# Patient Record
Sex: Male | Born: 1970 | Race: White | Hispanic: No | Marital: Married | Smoking: Former smoker
Health system: Southern US, Community
[De-identification: ages and names within clinical notes are randomized; demographics above are authoritative.]

## PROBLEM LIST (undated history)

## (undated) DIAGNOSIS — F329 Major depressive disorder, single episode, unspecified: Secondary | ICD-10-CM

## (undated) DIAGNOSIS — E872 Acidosis, unspecified: Secondary | ICD-10-CM

## (undated) DIAGNOSIS — A0472 Enterocolitis due to Clostridium difficile, not specified as recurrent: Secondary | ICD-10-CM

## (undated) DIAGNOSIS — K631 Perforation of intestine (nontraumatic): Secondary | ICD-10-CM

## (undated) DIAGNOSIS — K5931 Toxic megacolon: Secondary | ICD-10-CM

## (undated) DIAGNOSIS — I10 Essential (primary) hypertension: Secondary | ICD-10-CM

## (undated) DIAGNOSIS — F419 Anxiety disorder, unspecified: Secondary | ICD-10-CM

## (undated) DIAGNOSIS — K5090 Crohn's disease, unspecified, without complications: Secondary | ICD-10-CM

## (undated) DIAGNOSIS — K630 Abscess of intestine: Secondary | ICD-10-CM

## (undated) DIAGNOSIS — R6521 Severe sepsis with septic shock: Secondary | ICD-10-CM

## (undated) DIAGNOSIS — K632 Fistula of intestine: Secondary | ICD-10-CM

## (undated) DIAGNOSIS — A419 Sepsis, unspecified organism: Secondary | ICD-10-CM

## (undated) DIAGNOSIS — N179 Acute kidney failure, unspecified: Secondary | ICD-10-CM

## (undated) DIAGNOSIS — F32A Depression, unspecified: Secondary | ICD-10-CM

## (undated) DIAGNOSIS — M459 Ankylosing spondylitis of unspecified sites in spine: Secondary | ICD-10-CM

## (undated) HISTORY — DX: Acute kidney failure, unspecified: N17.9

## (undated) HISTORY — DX: Acidosis: E87.2

## (undated) HISTORY — DX: Perforation of intestine (nontraumatic): K63.1

## (undated) HISTORY — DX: Sepsis, unspecified organism: A41.9

## (undated) HISTORY — DX: Abscess of intestine: K63.0

## (undated) HISTORY — DX: Toxic megacolon: K59.31

## (undated) HISTORY — DX: Major depressive disorder, single episode, unspecified: F32.9

## (undated) HISTORY — DX: Depression, unspecified: F32.A

## (undated) HISTORY — DX: Fistula of intestine: K63.2

## (undated) HISTORY — DX: Essential (primary) hypertension: I10

## (undated) HISTORY — DX: Severe sepsis with septic shock: R65.21

## (undated) HISTORY — DX: Acidosis, unspecified: E87.20

---

## 2012-02-17 ENCOUNTER — Other Ambulatory Visit: Payer: Self-pay | Admitting: Rheumatology

## 2012-02-17 ENCOUNTER — Ambulatory Visit
Admission: RE | Admit: 2012-02-17 | Discharge: 2012-02-17 | Disposition: A | Discharge status: Home / Self Care | Payer: Managed Care, Other (non HMO) | Source: Ambulatory Visit | Attending: Rheumatology | Admitting: Rheumatology

## 2012-02-17 DIAGNOSIS — M459 Ankylosing spondylitis of unspecified sites in spine: Secondary | ICD-10-CM

## 2012-02-17 DIAGNOSIS — M549 Dorsalgia, unspecified: Secondary | ICD-10-CM

## 2012-02-23 ENCOUNTER — Encounter: Payer: Self-pay | Admitting: Family Medicine

## 2012-02-23 ENCOUNTER — Ambulatory Visit (INDEPENDENT_AMBULATORY_CARE_PROVIDER_SITE_OTHER): Payer: Managed Care, Other (non HMO) | Admitting: Family Medicine

## 2012-02-23 VITALS — BP 122/85 | HR 65 | Temp 98.7°F | Resp 18 | Ht 69.0 in | Wt 193.0 lb

## 2012-02-23 DIAGNOSIS — Z111 Encounter for screening for respiratory tuberculosis: Secondary | ICD-10-CM

## 2012-02-23 NOTE — Progress Notes (Signed)
Subjective: Patient is here for a TB skin test. He has no history of TB exposure. He works for a [employer]. He has history of ankylosing spondylitis and Crohn's disease, and has been on Humira in the past. He got off of it in his move here, and is having to jump through the tubes to get back on it. Therefore he needs a skin test. This  Assessment: TB skin testing Crohn's disease Ankylosing spondylitis  Plan: PPD Patient plans to have Dr Kellie Simmering read test at appt. On Thurs AM

## 2012-02-27 LAB — TB SKIN TEST: Induration: 0 mm

## 2014-06-20 ENCOUNTER — Encounter: Payer: Self-pay | Admitting: Gastroenterology

## 2014-06-28 ENCOUNTER — Other Ambulatory Visit: Payer: Self-pay | Admitting: Gastroenterology

## 2014-06-28 DIAGNOSIS — K625 Hemorrhage of anus and rectum: Secondary | ICD-10-CM

## 2014-06-28 DIAGNOSIS — K50911 Crohn's disease, unspecified, with rectal bleeding: Secondary | ICD-10-CM

## 2014-06-28 DIAGNOSIS — R1084 Generalized abdominal pain: Secondary | ICD-10-CM

## 2014-07-11 ENCOUNTER — Ambulatory Visit
Admission: RE | Admit: 2014-07-11 | Discharge: 2014-07-11 | Disposition: A | Discharge status: Home / Self Care | Payer: 59 | Source: Ambulatory Visit | Attending: Gastroenterology | Admitting: Gastroenterology

## 2014-07-11 DIAGNOSIS — K50911 Crohn's disease, unspecified, with rectal bleeding: Secondary | ICD-10-CM

## 2014-07-11 DIAGNOSIS — K625 Hemorrhage of anus and rectum: Secondary | ICD-10-CM

## 2014-07-11 DIAGNOSIS — R1084 Generalized abdominal pain: Secondary | ICD-10-CM

## 2014-07-11 MED ORDER — IOHEXOL 300 MG/ML  SOLN
100.0000 mL | Freq: Once | INTRAMUSCULAR | Status: AC | PRN
  Administered 2014-07-11: 100 mL via INTRAVENOUS

## 2014-07-13 LAB — HM COLONOSCOPY

## 2014-08-18 ENCOUNTER — Ambulatory Visit: Payer: Managed Care, Other (non HMO) | Admitting: Gastroenterology

## 2014-09-28 ENCOUNTER — Inpatient Hospital Stay (HOSPITAL_COMMUNITY)
Admission: AD | Admit: 2014-09-28 | Discharge: 2014-10-10 | DRG: 371 | Disposition: A | Discharge status: Home / Self Care | Payer: 59 | Source: Ambulatory Visit | Attending: Gastroenterology | Admitting: Gastroenterology

## 2014-09-28 ENCOUNTER — Encounter (HOSPITAL_COMMUNITY): Payer: Self-pay | Admitting: General Practice

## 2014-09-28 DIAGNOSIS — Z8619 Personal history of other infectious and parasitic diseases: Secondary | ICD-10-CM | POA: Diagnosis present

## 2014-09-28 DIAGNOSIS — R300 Dysuria: Secondary | ICD-10-CM | POA: Diagnosis present

## 2014-09-28 DIAGNOSIS — K5090 Crohn's disease, unspecified, without complications: Secondary | ICD-10-CM | POA: Diagnosis present

## 2014-09-28 DIAGNOSIS — E43 Unspecified severe protein-calorie malnutrition: Secondary | ICD-10-CM | POA: Diagnosis present

## 2014-09-28 DIAGNOSIS — E876 Hypokalemia: Secondary | ICD-10-CM | POA: Diagnosis not present

## 2014-09-28 DIAGNOSIS — Z87891 Personal history of nicotine dependence: Secondary | ICD-10-CM

## 2014-09-28 DIAGNOSIS — K5190 Ulcerative colitis, unspecified, without complications: Secondary | ICD-10-CM | POA: Diagnosis present

## 2014-09-28 DIAGNOSIS — D473 Essential (hemorrhagic) thrombocythemia: Secondary | ICD-10-CM | POA: Diagnosis present

## 2014-09-28 DIAGNOSIS — R634 Abnormal weight loss: Secondary | ICD-10-CM | POA: Diagnosis present

## 2014-09-28 DIAGNOSIS — D649 Anemia, unspecified: Secondary | ICD-10-CM | POA: Diagnosis present

## 2014-09-28 DIAGNOSIS — D72829 Elevated white blood cell count, unspecified: Secondary | ICD-10-CM | POA: Diagnosis present

## 2014-09-28 DIAGNOSIS — E860 Dehydration: Secondary | ICD-10-CM | POA: Diagnosis present

## 2014-09-28 DIAGNOSIS — A047 Enterocolitis due to Clostridium difficile: Secondary | ICD-10-CM | POA: Diagnosis present

## 2014-09-28 DIAGNOSIS — D75839 Thrombocytosis, unspecified: Secondary | ICD-10-CM | POA: Diagnosis present

## 2014-09-28 DIAGNOSIS — E8809 Other disorders of plasma-protein metabolism, not elsewhere classified: Secondary | ICD-10-CM | POA: Diagnosis present

## 2014-09-28 DIAGNOSIS — M459 Ankylosing spondylitis of unspecified sites in spine: Secondary | ICD-10-CM | POA: Diagnosis present

## 2014-09-28 DIAGNOSIS — Z6824 Body mass index (BMI) 24.0-24.9, adult: Secondary | ICD-10-CM

## 2014-09-28 HISTORY — DX: Enterocolitis due to Clostridium difficile, not specified as recurrent: A04.72

## 2014-09-28 HISTORY — DX: Ankylosing spondylitis of unspecified sites in spine: M45.9

## 2014-09-28 HISTORY — DX: Crohn's disease, unspecified, without complications: K50.90

## 2014-09-28 LAB — BASIC METABOLIC PANEL
Anion gap: 10 (ref 5–15)
BUN: 10 mg/dL (ref 6–23)
CHLORIDE: 94 mmol/L — AB (ref 96–112)
CO2: 24 mmol/L (ref 19–32)
Calcium: 7.7 mg/dL — ABNORMAL LOW (ref 8.4–10.5)
Creatinine, Ser: 0.95 mg/dL (ref 0.50–1.35)
Glucose, Bld: 121 mg/dL — ABNORMAL HIGH (ref 70–99)
POTASSIUM: 3.6 mmol/L (ref 3.5–5.1)
Sodium: 128 mmol/L — ABNORMAL LOW (ref 135–145)

## 2014-09-28 LAB — CBC
HCT: 29.3 % — ABNORMAL LOW (ref 39.0–52.0)
HEMOGLOBIN: 10 g/dL — AB (ref 13.0–17.0)
MCH: 30.8 pg (ref 26.0–34.0)
MCHC: 34.1 g/dL (ref 30.0–36.0)
MCV: 90.2 fL (ref 78.0–100.0)
Platelets: 408 10*3/uL — ABNORMAL HIGH (ref 150–400)
RBC: 3.25 MIL/uL — ABNORMAL LOW (ref 4.22–5.81)
RDW: 15.7 % — AB (ref 11.5–15.5)
WBC: 18.4 10*3/uL — ABNORMAL HIGH (ref 4.0–10.5)

## 2014-09-28 MED ORDER — ONDANSETRON HCL 4 MG/2ML IJ SOLN
4.0000 mg | Freq: Three times a day (TID) | INTRAMUSCULAR | Status: DC | PRN
  Administered 2014-09-28 – 2014-10-07 (×15): 4 mg via INTRAVENOUS
  Filled 2014-09-28 (×16): qty 2

## 2014-09-28 MED ORDER — VANCOMYCIN 50 MG/ML ORAL SOLUTION
125.0000 mg | Freq: Four times a day (QID) | ORAL | Status: DC
  Administered 2014-09-28 – 2014-10-01 (×11): 125 mg via ORAL
  Filled 2014-09-28 (×16): qty 2.5

## 2014-09-28 MED ORDER — BOOST / RESOURCE BREEZE PO LIQD
1.0000 | Freq: Three times a day (TID) | ORAL | Status: DC
  Administered 2014-09-28 – 2014-10-08 (×28): 1 via ORAL

## 2014-09-28 MED ORDER — MORPHINE SULFATE 2 MG/ML IJ SOLN
2.0000 mg | INTRAMUSCULAR | Status: DC | PRN
  Administered 2014-09-28 – 2014-09-30 (×16): 2 mg via INTRAVENOUS
  Filled 2014-09-28 (×16): qty 1

## 2014-09-28 MED ORDER — SODIUM CHLORIDE 0.9 % IV SOLN: INTRAVENOUS | Status: DC

## 2014-09-28 MED ORDER — SODIUM CHLORIDE 0.9 % IV SOLN
INTRAVENOUS | Status: DC
  Administered 2014-09-28 – 2014-09-29 (×6): via INTRAVENOUS

## 2014-09-28 MED ORDER — CETYLPYRIDINIUM CHLORIDE 0.05 % MT LIQD
7.0000 mL | Freq: Two times a day (BID) | OROMUCOSAL | Status: DC
  Administered 2014-09-28 – 2014-10-08 (×17): 7 mL via OROMUCOSAL

## 2014-09-28 MED ORDER — METHYLPREDNISOLONE SODIUM SUCC 40 MG IJ SOLR
40.0000 mg | Freq: Two times a day (BID) | INTRAMUSCULAR | Status: DC
  Administered 2014-09-28 – 2014-10-05 (×14): 40 mg via INTRAVENOUS
  Filled 2014-09-28 (×20): qty 1

## 2014-09-28 NOTE — H&P (Signed)
Vincent Black HPI: This is a 44 year old male with a PMH of Crohn's disease diagnosed in 1998 and ankylosing spondylitis admitted to the hospital for dehydration, weight loss, and a flare of his Crohn's disease.  In 06/2014 he was treated for a flare of his Crohn's.  In the past he was treated with Remicade, but then he lost his insurance.  He was not responsive to a reintroduction of his Remicade and it was around 2008-2010 that he was diagnosed with Ankylosing Spondylitis.  Humira was initiated and then his Crohn's went into remission until recently.  He was evaluated by Dr. Amil Amen, his Rheumatologist, and he was treated with Humira for his Ankylosing Spondylitis.  A Medrol Dose Pack was started by Dr. Amil Amen and he was promptly referred to me for further evaluation and treatment.  A colonoscopy was performed and it did reveal a significant amount of inflammation, but he started to improve as AZA was instituted.  He was well for about a month or so and then he started to have diarrhea again.  Work up in the office revealed that he had C. Diff and despite treatment with metronidazole he continued to worsen.  Follow up blood work in the office demonstrated an increase in his CRP.  I had offered to admit him to the hospital a couple of weeks ago, but he wanted to avoid this option.  He was supposed to undergo a FFS today to see the extent of his inflammation, but he was promptly admitted.  The patient was dehydrated and very weak.  In fact, his wife states that he had three brief syncopal episodes last evening.  Additionally, he has lost weight and he reports fevers and chills.  When he was first started on treatment for his C. Diff his fever resolved.  Past Medical History  Diagnosis Date  . Crohn disease     Past Surgical History  Procedure Laterality Date  . No past surgeries      History reviewed. No pertinent family history.  Social History:  reports that he has quit smoking. He has never  used smokeless tobacco. He reports that he drinks alcohol. He reports that he does not use illicit drugs.  Allergies: No Known Allergies  Medications:  Scheduled: . feeding supplement (RESOURCE BREEZE)  1 Container Oral TID BM  . methylPREDNISolone (SOLU-MEDROL) injection  40 mg Intravenous Q12H  . vancomycin  125 mg Oral 4 times per day   Continuous: . sodium chloride 200 mL/hr at 09/28/14 1404  . sodium chloride      Results for orders placed or performed during the hospital encounter of 09/28/14 (from the past 24 hour(s))  CBC     Status: Abnormal   Collection Time: 09/28/14  2:30 PM  Result Value Ref Range   WBC 18.4 (H) 4.0 - 10.5 K/uL   RBC 3.25 (L) 4.22 - 5.81 MIL/uL   Hemoglobin 10.0 (L) 13.0 - 17.0 g/dL   HCT 29.3 (L) 39.0 - 52.0 %   MCV 90.2 78.0 - 100.0 fL   MCH 30.8 26.0 - 34.0 pg   MCHC 34.1 30.0 - 36.0 g/dL   RDW 15.7 (H) 11.5 - 15.5 %   Platelets 408 (H) 150 - 400 K/uL  Basic metabolic panel     Status: Abnormal   Collection Time: 09/28/14  2:30 PM  Result Value Ref Range   Sodium 128 (L) 135 - 145 mmol/L   Potassium 3.6 3.5 - 5.1 mmol/L  Chloride 94 (L) 96 - 112 mmol/L   CO2 24 19 - 32 mmol/L   Glucose, Bld 121 (H) 70 - 99 mg/dL   BUN 10 6 - 23 mg/dL   Creatinine, Ser 0.95 0.50 - 1.35 mg/dL   Calcium 7.7 (L) 8.4 - 10.5 mg/dL   GFR calc non Af Amer >90 >90 mL/min   GFR calc Af Amer >90 >90 mL/min   Anion gap 10 5 - 15     No results found.  ROS:  As stated above in the HPI otherwise negative.  Blood pressure 125/87, pulse 122, temperature 100.1 F (37.8 C), temperature source Oral, resp. rate 17, height 5\' 9"  (1.753 m), weight 74.4 kg (164 lb 0.4 oz), SpO2 100 %.    PE: Gen: NAD, Alert and Oriented, weak, and fatigued HEENT:  [place]/AT, EOMI Neck: Supple, no LAD Lungs: CTA Bilaterally CV: RRR without M/G/R ABM: Soft, tender in the LLQ, +BS Ext: No C/C/E  Assessment/Plan: 1) Crohn's Flare. 2) ? Persistent C. Diff.   I do not know why he  continues to worsen.  He had two weeks of treatment for C. Diff with metornidazole, but I will change him over to vancomycin while the repeat C. Diff toxin is pending.  Additionally, I will perform the FFS in the AM tomorrow.  Hopefully IV steroids as well as IV hydration will help to improve him clinically.  If he is negative for C. Diff, I will increase his Humira to every week dosing.    Plan: 1) FFS in the AM. 2) Solumedrol 40 mg IV Q12 hours. 3) Vancomycin 125 mg QID. 4) Pain control.  Donya Tomaro D 09/28/2014, 3:33 PM

## 2014-09-28 NOTE — Progress Notes (Signed)
NURSING PROGRESS NOTE  Vincent Black [id number] Admission Data: 09/28/2014 6:17 PM Attending Provider: Beryle Beams, MD PCP:No primary care provider on file. Code Status: Full  Vincent Black is a 44 y.o. male patient admitted from ED:  -No acute distress noted.  -No complaints of shortness of breath.  -No complaints of chest pain.    Blood pressure 125/87, pulse 122, temperature 100.1 F (37.8 C), temperature source Oral, resp. rate 17, height 5\' 9"  (1.753 m), weight 74.4 kg (164 lb 0.4 oz), SpO2 100 %.   IV Fluids:  IV in place, occlusive dsg intact without redness, IV cath wrist left, condition patent and no redness normal saline.   Allergies:  Review of patient's allergies indicates no known allergies.  Past Medical History:   has a past medical history of Crohn disease.  Past Surgical History:   has past surgical history that includes No past surgeries.  Social History:   reports that he has quit smoking. He has never used smokeless tobacco. He reports that he drinks alcohol. He reports that he does not use illicit drugs.  Skin: Intact  Patient/Family orientated to room. Information packet given to patient/family. Admission inpatient armband information verified with patient/family to include name and date of birth and placed on patient arm. Side rails up x 2, fall assessment and education completed with patient/family. Patient/family able to verbalize understanding of risk associated with falls and verbalized understanding to call for assistance before getting out of bed. Call light within reach. Patient/family able to voice and demonstrate understanding of unit orientation instructions.    Will continue to evaluate and treat per MD orders.

## 2014-09-29 ENCOUNTER — Encounter (HOSPITAL_COMMUNITY): Payer: Self-pay

## 2014-09-29 ENCOUNTER — Encounter (HOSPITAL_COMMUNITY)
Admission: AD | Disposition: A | Discharge status: Home / Self Care | Payer: Self-pay | Source: Ambulatory Visit | Attending: Gastroenterology

## 2014-09-29 HISTORY — PX: FLEXIBLE SIGMOIDOSCOPY: SHX5431

## 2014-09-29 LAB — CBC
HEMATOCRIT: 26.8 % — AB (ref 39.0–52.0)
HEMOGLOBIN: 8.9 g/dL — AB (ref 13.0–17.0)
MCH: 30.5 pg (ref 26.0–34.0)
MCHC: 33.2 g/dL (ref 30.0–36.0)
MCV: 91.8 fL (ref 78.0–100.0)
PLATELETS: 390 10*3/uL (ref 150–400)
RBC: 2.92 MIL/uL — AB (ref 4.22–5.81)
RDW: 16.2 % — ABNORMAL HIGH (ref 11.5–15.5)
WBC: 16.4 10*3/uL — ABNORMAL HIGH (ref 4.0–10.5)

## 2014-09-29 LAB — BASIC METABOLIC PANEL
Anion gap: 6 (ref 5–15)
BUN: 10 mg/dL (ref 6–23)
CHLORIDE: 105 mmol/L (ref 96–112)
CO2: 25 mmol/L (ref 19–32)
Calcium: 7.8 mg/dL — ABNORMAL LOW (ref 8.4–10.5)
Creatinine, Ser: 0.84 mg/dL (ref 0.50–1.35)
GFR calc Af Amer: >90 mL/min (ref >90)
GFR calc non Af Amer: >90 mL/min (ref >90)
GLUCOSE: 157 mg/dL — AB (ref 70–99)
Potassium: 3.9 mmol/L (ref 3.5–5.1)
Sodium: 136 mmol/L (ref 135–145)

## 2014-09-29 LAB — C-REACTIVE PROTEIN: CRP: 24.7 mg/dL — ABNORMAL HIGH (ref <0.60)

## 2014-09-29 LAB — CLOSTRIDIUM DIFFICILE BY PCR: CDIFFPCR: POSITIVE — AB

## 2014-09-29 SURGERY — SIGMOIDOSCOPY, FLEXIBLE
Anesthesia: Moderate Sedation

## 2014-09-29 MED ORDER — FENTANYL CITRATE 0.05 MG/ML IJ SOLN
INTRAMUSCULAR | Status: DC | PRN
  Administered 2014-09-29 (×2): 25 ug via INTRAVENOUS

## 2014-09-29 MED ORDER — MIDAZOLAM HCL 10 MG/2ML IJ SOLN
INTRAMUSCULAR | Status: DC | PRN
  Administered 2014-09-29: 1 mg via INTRAVENOUS
  Administered 2014-09-29 (×2): 2 mg via INTRAVENOUS

## 2014-09-29 MED ORDER — FENTANYL CITRATE 0.05 MG/ML IJ SOLN
INTRAMUSCULAR | Status: AC
  Filled 2014-09-29: qty 2

## 2014-09-29 MED ORDER — MIDAZOLAM HCL 5 MG/ML IJ SOLN
INTRAMUSCULAR | Status: AC
  Filled 2014-09-29: qty 2

## 2014-09-29 MED ORDER — DIPHENHYDRAMINE HCL 50 MG/ML IJ SOLN
INTRAMUSCULAR | Status: AC
  Filled 2014-09-29: qty 1

## 2014-09-29 NOTE — Progress Notes (Signed)
INITIAL NUTRITION ASSESSMENT  DOCUMENTATION CODES Per approved criteria  -Not Applicable   INTERVENTION: Resource Breeze po TID, each supplement provides 250 kcal and 9 grams of protein  NUTRITION DIAGNOSIS: Inadequate oral intake related to altered GI function as evidenced by clear liquids, crohn's flare.   Goal: Pt will meet >90% of estimated nutritional needs  Monitor:  Diet advancement, PO/supplement intake, labs, weight changes, I/O's  Reason for Assessment: MST=5  44 y.o. male  Admitting Dx: <principal problem not specified>  This is a 44 year old male with a PMH of Crohn's disease diagnosed in 1998 and ankylosing spondylitis admitted to the hospital for dehydration, weight loss, and a flare of his Crohn's disease. In 06/2014 he was treated for a flare of his Crohn's. In the past he was treated with Remicade, but then he lost his insurance. He was not responsive to a reintroduction of his Remicade and it was around 2008-2010 that he was diagnosed with Ankylosing Spondylitis. Humira was initiated and then his Crohn's went into remission until recently.  ASSESSMENT: Pt admitted with crohn's flare-up.   S/p Procedure(s) on 09/29/14: FLEXIBLE SIGMOIDOSCOPY (N/A)  Pt drowsy from procedure. Nutrition-focused physical exam deferred at this time. Chart review reveals pt with hx of weight loss and poor po intake. Limited wt hx available at this time. Noted pt takes Resource Breeze supplements at home- RD will add due to continuity of care.  Labs reviewed. Calcium: 7.8, Glucose: 157.  Height: Ht Readings from Last 1 Encounters:  09/28/14 5\' 9"  (1.753 m)    Weight: Wt Readings from Last 1 Encounters:  09/28/14 164 lb 0.4 oz (74.4 kg)    Ideal Body Weight: 160#  % Ideal Body Weight: 103%  Wt Readings from Last 10 Encounters:  09/28/14 164 lb 0.4 oz (74.4 kg)  02/23/12 193 lb (87.544 kg)    Usual Body Weight: 194#  % Usual Body Weight: 85%  BMI:  Body mass index  is 24.21 kg/(m^2). Normal weight range  Estimated Nutritional Needs: Kcal: 2000-2200 Protein: 90-`00 grams Fluid: 2.0-2.2 L  Skin: WDL  Diet Order: Diet clear liquid  EDUCATION NEEDS: -Education not appropriate at this time   Intake/Output Summary (Last 24 hours) at 09/29/14 1451 Last data filed at 09/29/14 1056  Gross per 24 hour  Intake  592.5 ml  Output    600 ml  Net   -7.5 ml    Last BM: 09/28/14  Labs:   Recent Labs Lab 09/28/14 1430 09/29/14 1025  NA 128* 136  K 3.6 3.9  CL 94* 105  CO2 24 25  BUN 10 10  CREATININE 0.95 0.84  CALCIUM 7.7* 7.8*  GLUCOSE 121* 157*    CBG (last 3)  No results for input(s): GLUCAP in the last 72 hours.  Scheduled Meds: . antiseptic oral rinse  7 mL Mouth Rinse BID  . feeding supplement (RESOURCE BREEZE)  1 Container Oral TID BM  . methylPREDNISolone (SOLU-MEDROL) injection  40 mg Intravenous Q12H  . vancomycin  125 mg Oral 4 times per day    Continuous Infusions: . sodium chloride 200 mL/hr at 09/29/14 1128    Past Medical History  Diagnosis Date  . Crohn disease     Past Surgical History  Procedure Laterality Date  . No past surgeries      Talicia Sui A. Jimmye Norman, RD, LDN, CDE Pager: [phone] After hours Pager: [phone]

## 2014-09-29 NOTE — Interval H&P Note (Signed)
History and Physical Interval Note:  09/29/2014 8:16 AM  Vincent Black  has presented today for surgery, with the diagnosis of Crohn's disease  The various methods of treatment have been discussed with the patient and family. After consideration of risks, benefits and other options for treatment, the patient has consented to  Procedure(s): FLEXIBLE SIGMOIDOSCOPY (N/A) as a surgical intervention .  The patient's history has been reviewed, patient examined, no change in status, stable for surgery.  I have reviewed the patient's chart and labs.  Questions were answered to the patient's satisfaction.     Castulo Scarpelli D

## 2014-09-29 NOTE — Op Note (Signed)
[facility] [place], [id number]   FLEXIBLE SIGMOIDOSCOPY PROCEDURE REPORT  PATIENT: Angel, Weedon  MR#: [id number] BIRTHDATE: Aug 19, 1970 , 43  yrs. old GENDER: male ENDOSCOPIST: Carol Ada, MD REFERRED BY: PROCEDURE DATE:  09/29/2014 PROCEDURE:   Sigmoidoscopy with biopsy ASA CLASS:   Class III INDICATIONS: Crohn's flare MEDICATIONS: Versed 5 mg IV and Fentanyl 50 mcg IV  DESCRIPTION OF PROCEDURE:   After the risks benefits and alternatives of the procedure were thoroughly explained, informed consent was obtained.  Digital exam revealed no abnormalities of the rectum. The     endoscope was introduced through the anus  and advanced to the descending colon , The exam was Without limitations.    The quality of the prep was The overall prep quality was good. .  The instrument was then slowly withdrawn as the mucosa was fully examined.       FINDINGS: The adult endoscope was advanced to the distal descending colon.  Severe inflammation was noted throughout the entire examined colon.  The mucosa was friable, granular, edematous, and ulcerated.  Six superficial biopsies were obtained.          The scope was then withdrawn from the patient and the procedure terminated.  COMPLICATIONS: There were no immediate complications.  ENDOSCOPIC IMPRESSION: 1) Severe colonic inflammation.  RECOMMENDATIONS: 1) Continue with Vancomycin. 2) Continue with Solumedrol. 3) Follow up C. diff PCR. 4) Await biopsy results.  REPEAT EXAM:  eSigned:  Carol Ada, MD 09/29/2014 8:55 AM   CC:

## 2014-09-30 ENCOUNTER — Encounter (HOSPITAL_COMMUNITY): Payer: Self-pay | Admitting: Internal Medicine

## 2014-09-30 DIAGNOSIS — A0472 Enterocolitis due to Clostridium difficile, not specified as recurrent: Secondary | ICD-10-CM

## 2014-09-30 DIAGNOSIS — A047 Enterocolitis due to Clostridium difficile: Principal | ICD-10-CM

## 2014-09-30 DIAGNOSIS — E43 Unspecified severe protein-calorie malnutrition: Secondary | ICD-10-CM

## 2014-09-30 DIAGNOSIS — K50918 Crohn's disease, unspecified, with other complication: Secondary | ICD-10-CM

## 2014-09-30 DIAGNOSIS — Z8619 Personal history of other infectious and parasitic diseases: Secondary | ICD-10-CM | POA: Diagnosis present

## 2014-09-30 DIAGNOSIS — M459 Ankylosing spondylitis of unspecified sites in spine: Secondary | ICD-10-CM

## 2014-09-30 HISTORY — DX: Enterocolitis due to Clostridium difficile, not specified as recurrent: A04.72

## 2014-09-30 HISTORY — DX: Ankylosing spondylitis of unspecified sites in spine: M45.9

## 2014-09-30 MED ORDER — KCL IN DEXTROSE-NACL 20-5-0.9 MEQ/L-%-% IV SOLN
INTRAVENOUS | Status: DC
  Administered 2014-09-30 – 2014-10-03 (×11): via INTRAVENOUS
  Administered 2014-10-04 (×2): 150 mL/h via INTRAVENOUS
  Filled 2014-09-30 (×21): qty 1000

## 2014-09-30 MED ORDER — HYDROMORPHONE HCL 1 MG/ML IJ SOLN
2.0000 mg | INTRAMUSCULAR | Status: DC | PRN
  Administered 2014-09-30 – 2014-10-08 (×72): 2 mg via INTRAVENOUS
  Filled 2014-09-30 (×74): qty 2

## 2014-09-30 NOTE — Progress Notes (Signed)
   Patient Name: Vincent Black Date of Encounter: 09/30/2014, 9:42 AM    Subjective  Still w/ LLQ pain and diarrhea - about the same Morphine dose inadequate and wears of quickly   Objective  BP 102/66 mmHg  Pulse 79  Temp(Src) 98.1 F (36.7 C) (Oral)  Resp 12  Ht 5\' 9"  (1.753 m)  Wt 164 lb 0.4 oz (74.4 kg)  BMI 24.21 kg/m2  SpO2 99% Mildly ill appearing wm Abdomen is soft w/ mod LLQ tenderness, mild distention, BSS + and no guarding He is alert and oriented x 3  Lab Results  Component Value Date   WBC 16.4* 09/29/2014   HGB 8.9* 09/29/2014   HCT 26.8* 09/29/2014   MCV 91.8 09/29/2014   PLT 390 09/29/2014   Lab Results  Component Value Date   CREATININE 0.84 09/29/2014   BUN 10 09/29/2014   NA 136 09/29/2014   K 3.9 09/29/2014   CL 105 09/29/2014   CO2 25 09/29/2014      Assessment and Plan  1) Crohn's colitis 2) C diff colitis presumed  3) ankylosing spondylitis 4) Malnutrition/weight loss   - continue solumedrol and vancomycin  - adjust analgesics  - stay on clears - not much appetite - dietitian consult re supplements - CBC and BMET tomorrow  Gatha Mayer, MD, [facility] [phone] (pager) 09/30/2014 9:42 AM

## 2014-10-01 DIAGNOSIS — E876 Hypokalemia: Secondary | ICD-10-CM | POA: Insufficient documentation

## 2014-10-01 LAB — COMPREHENSIVE METABOLIC PANEL
ALBUMIN: 1.5 g/dL — AB (ref 3.5–5.2)
ALT: 10 U/L (ref 0–53)
ANION GAP: 3 — AB (ref 5–15)
AST: 15 U/L (ref 0–37)
Alkaline Phosphatase: 39 U/L (ref 39–117)
BILIRUBIN TOTAL: 0.2 mg/dL — AB (ref 0.3–1.2)
CHLORIDE: 106 mmol/L (ref 96–112)
CO2: 27 mmol/L (ref 19–32)
CREATININE: 0.76 mg/dL (ref 0.50–1.35)
Calcium: 7.5 mg/dL — ABNORMAL LOW (ref 8.4–10.5)
GFR calc non Af Amer: >90 mL/min (ref >90)
Glucose, Bld: 128 mg/dL — ABNORMAL HIGH (ref 70–99)
Potassium: 3.3 mmol/L — ABNORMAL LOW (ref 3.5–5.1)
Sodium: 136 mmol/L (ref 135–145)
Total Protein: 4.9 g/dL — ABNORMAL LOW (ref 6.0–8.3)

## 2014-10-01 LAB — CBC
HEMATOCRIT: 22.6 % — AB (ref 39.0–52.0)
Hemoglobin: 7.5 g/dL — ABNORMAL LOW (ref 13.0–17.0)
MCH: 31.1 pg (ref 26.0–34.0)
MCHC: 33.2 g/dL (ref 30.0–36.0)
MCV: 93.8 fL (ref 78.0–100.0)
Platelets: 409 10*3/uL — ABNORMAL HIGH (ref 150–400)
RBC: 2.41 MIL/uL — AB (ref 4.22–5.81)
RDW: 16.2 % — ABNORMAL HIGH (ref 11.5–15.5)
WBC: 10 10*3/uL (ref 4.0–10.5)

## 2014-10-01 MED ORDER — POTASSIUM CHLORIDE 10 MEQ/100ML IV SOLN
10.0000 meq | INTRAVENOUS | Status: AC
  Administered 2014-10-01 (×2): 10 meq via INTRAVENOUS
  Filled 2014-10-01 (×2): qty 100

## 2014-10-01 MED ORDER — METRONIDAZOLE IN NACL 5-0.79 MG/ML-% IV SOLN
500.0000 mg | Freq: Four times a day (QID) | INTRAVENOUS | Status: DC
  Administered 2014-10-01 – 2014-10-05 (×15): 500 mg via INTRAVENOUS
  Filled 2014-10-01 (×20): qty 100

## 2014-10-01 MED ORDER — VANCOMYCIN 50 MG/ML ORAL SOLUTION
500.0000 mg | Freq: Four times a day (QID) | ORAL | Status: DC
  Administered 2014-10-01 – 2014-10-10 (×36): 500 mg via ORAL
  Filled 2014-10-01 (×40): qty 10

## 2014-10-01 NOTE — Progress Notes (Addendum)
     [facility] Progress Note  Subjective:   No real change--still with LLQ pain and diarrhea. Had 5 loose BMs last pm. WBC has normalized.   Objective:  Vital signs in last 24 hours: Temp:  [98.5 F (36.9 C)-98.6 F (37 C)] 98.6 F (37 C) (02/21 0640) Pulse Rate:  [69-73] 69 (02/21 0640) BP: (114-126)/(71-74) 114/71 mmHg (02/21 0640) SpO2:  [98 %-99 %] 98 % (02/21 0640) Last BM Date: 10/01/14 General:   Alert,  Well-developed,  Ill appearing Heart:  Regular rate and rhythm; no murmurs Pulm;lungs clear Abdomen:  Soft, mild LLQ tenderness, mildly distended distended. Normal bowel sounds, without guarding, and without rebound.   Extremities:  Without edema. Neurologic:  Alert and  oriented x4;  grossly normal neurologically. Psych:  Alert and cooperative. Normal mood and affect.  Lab Results:  Recent Labs  09/28/14 1430 09/29/14 1025 10/01/14 0600  WBC 18.4* 16.4* 10.0  HGB 10.0* 8.9* 7.5*  HCT 29.3* 26.8* 22.6*  PLT 408* 390 409*   BMET  Recent Labs  09/28/14 1430 09/29/14 1025 10/01/14 0600  NA 128* 136 136  K 3.6 3.9 3.3*  CL 94* 105 106  CO2 24 25 27   GLUCOSE 121* 157* 128*  BUN 10 10 <5*  CREATININE 0.95 0.84 0.76  CALCIUM 7.7* 7.8* 7.5*   LFT  Recent Labs  10/01/14 0600  PROT 4.9*  ALBUMIN 1.5*  AST 15  ALT 10  ALKPHOS 39  BILITOT 0.2*     ASSESSMENT/PLAN:   1) Crohn's colitis 2) C diff colitis presumed  3) ankylosing spondylitis 4) Malnutrition/weight loss 5) Hypokalemia   Continue vancomycin and solumedrol. Continue clears as pt has minimal appetite.K+ replacement ordered. CBC and BMET in a.m. tomorrow.    LOS: 3 days   Hvozdovic, Vita Barley PA-C 10/01/2014, Pager [phone]  [facility] Attending  I have also seen and assessed the patient and agree with the above note. About the same - not a surprise Pharmacy has suggested increasing vancomycin to 500 mg - reasonable in this setting. Ordered Also add IV  metronidazole Await path Replete K   Gatha Mayer, MD, [facility] [phone] (pager) 10/01/2014 11:09 AM

## 2014-10-02 ENCOUNTER — Encounter (HOSPITAL_COMMUNITY): Payer: Self-pay | Admitting: Gastroenterology

## 2014-10-02 LAB — CBC
HEMATOCRIT: 23.6 % — AB (ref 39.0–52.0)
HEMOGLOBIN: 7.8 g/dL — AB (ref 13.0–17.0)
MCH: 30.8 pg (ref 26.0–34.0)
MCHC: 33.1 g/dL (ref 30.0–36.0)
MCV: 93.3 fL (ref 78.0–100.0)
PLATELETS: 466 10*3/uL — AB (ref 150–400)
RBC: 2.53 MIL/uL — ABNORMAL LOW (ref 4.22–5.81)
RDW: 16.1 % — ABNORMAL HIGH (ref 11.5–15.5)
WBC: 9.5 10*3/uL (ref 4.0–10.5)

## 2014-10-02 LAB — BASIC METABOLIC PANEL
Anion gap: 3 — ABNORMAL LOW (ref 5–15)
CALCIUM: 7.5 mg/dL — AB (ref 8.4–10.5)
CO2: 26 mmol/L (ref 19–32)
Chloride: 107 mmol/L (ref 96–112)
Creatinine, Ser: 0.65 mg/dL (ref 0.50–1.35)
GFR calc non Af Amer: >90 mL/min (ref >90)
Glucose, Bld: 96 mg/dL (ref 70–99)
Potassium: 3.4 mmol/L — ABNORMAL LOW (ref 3.5–5.1)
Sodium: 136 mmol/L (ref 135–145)

## 2014-10-02 NOTE — Progress Notes (Signed)
Subjective: Only two bowel movements last evening.  Still feeling ill, however, his dysuria complaint has improved.  Objective: Vital signs in last 24 hours: Temp:  [97.7 F (36.5 C)-98.8 F (37.1 C)] 98.1 F (36.7 C) (02/22 0610) Pulse Rate:  [66-80] 66 (02/22 0610) Resp:  [16-18] 18 (02/22 0610) BP: (122-128)/(72-80) 122/77 mmHg (02/22 0610) SpO2:  [97 %-100 %] 99 % (02/22 0610) Last BM Date: 10/01/14  Intake/Output from previous day: 02/21 0701 - 02/22 0700 In: 3872.5 [I.V.:3472.5; IV Piggyback:400] Out: -  Intake/Output this shift:    General appearance: alert, fatigued and no distress GI: tender in the LLQ  Lab Results:  Recent Labs  09/29/14 1025 10/01/14 0600  WBC 16.4* 10.0  HGB 8.9* 7.5*  HCT 26.8* 22.6*  PLT 390 409*   BMET  Recent Labs  09/29/14 1025 10/01/14 0600  NA 136 136  K 3.9 3.3*  CL 105 106  CO2 25 27  GLUCOSE 157* 128*  BUN 10 <5*  CREATININE 0.84 0.76  CALCIUM 7.8* 7.5*   LFT  Recent Labs  10/01/14 0600  PROT 4.9*  ALBUMIN 1.5*  AST 15  ALT 10  ALKPHOS 39  BILITOT 0.2*   PT/INR No results for input(s): LABPROT, INR in the last 72 hours. Hepatitis Panel No results for input(s): HEPBSAG, HCVAB, HEPAIGM, HEPBIGM in the last 72 hours. C-Diff No results for input(s): CDIFFTOX in the last 72 hours. Fecal Lactopherrin No results for input(s): FECLLACTOFRN in the last 72 hours.  Studies/Results: No results found.  Medications:  Scheduled: . antiseptic oral rinse  7 mL Mouth Rinse BID  . feeding supplement (RESOURCE BREEZE)  1 Container Oral TID BM  . methylPREDNISolone (SOLU-MEDROL) injection  40 mg Intravenous Q12H  . metronidazole  500 mg Intravenous Q6H  . vancomycin  500 mg Oral 4 times per day   Continuous: . dextrose 5 % and 0.9 % NaCl with KCl 20 mEq/L 150 mL/hr at 10/02/14 0604    Assessment/Plan: 1) C. Diff colitis. 2) Crohn's disease.   With further discussion it does appear that he is getting better.   Previously he would have q hour bowel movements in the evening.  He also reports feeling hungry, which was not an issue at the time of admission.  His WBC normalized yesterday, but the blood work is pending for this AM.  No evidence of a toxic megacolon.  Dr. Carlean Purl increased the vancomycin to 500 mg and added on metronidazole.  I think he is starting to improve, but it is a slow course.  Plan: 1) Continue with Vanc and Metronidazole. 2) Continue with Solumedrol. 3) Advance to a regular diet and he can eat as tolerated. 4) Continue with pain control.   LOS: 4 days   Vlad Mayberry D 10/02/2014, 8:17 AM

## 2014-10-03 LAB — C-REACTIVE PROTEIN: CRP: 4.9 mg/dL — ABNORMAL HIGH (ref <0.60)

## 2014-10-03 NOTE — Progress Notes (Signed)
Subjective: He feels better.  The pain intensity has lessened and his bowel movement frequency has declined.  Objective: Vital signs in last 24 hours: Temp:  [97.7 F (36.5 C)-98.6 F (37 C)] 97.7 F (36.5 C) (02/23 0622) Pulse Rate:  [62-80] 62 (02/23 0622) Resp:  [16-18] 18 (02/23 0622) BP: (129-135)/(67-80) 134/71 mmHg (02/23 0622) SpO2:  [97 %-100 %] 100 % (02/23 0622) Last BM Date: 10/02/14  Intake/Output from previous day: 02/22 0701 - 02/23 0700 In: 5299.5 [P.O.:1057; I.V.:3742.5; IV Piggyback:500] Out: 2200 [Urine:2200] Intake/Output this shift:    General appearance: alert and no distress GI: soft, non-tender; bowel sounds normal; no masses,  no organomegaly  Lab Results:  Recent Labs  10/01/14 0600 10/02/14 1113  WBC 10.0 9.5  HGB 7.5* 7.8*  HCT 22.6* 23.6*  PLT 409* 466*   BMET  Recent Labs  10/01/14 0600 10/02/14 0835  NA 136 136  K 3.3* 3.4*  CL 106 107  CO2 27 26  GLUCOSE 128* 96  BUN <5* <5*  CREATININE 0.76 0.65  CALCIUM 7.5* 7.5*   LFT  Recent Labs  10/01/14 0600  PROT 4.9*  ALBUMIN 1.5*  AST 15  ALT 10  ALKPHOS 39  BILITOT 0.2*   PT/INR No results for input(s): LABPROT, INR in the last 72 hours. Hepatitis Panel No results for input(s): HEPBSAG, HCVAB, HEPAIGM, HEPBIGM in the last 72 hours. C-Diff No results for input(s): CDIFFTOX in the last 72 hours. Fecal Lactopherrin No results for input(s): FECLLACTOFRN in the last 72 hours.  Studies/Results: No results found.  Medications:  Scheduled: . antiseptic oral rinse  7 mL Mouth Rinse BID  . feeding supplement (RESOURCE BREEZE)  1 Container Oral TID BM  . methylPREDNISolone (SOLU-MEDROL) injection  40 mg Intravenous Q12H  . metronidazole  500 mg Intravenous Q6H  . vancomycin  500 mg Oral 4 times per day   Continuous: . dextrose 5 % and 0.9 % NaCl with KCl 20 mEq/L 150 mL/hr at 10/03/14 0618    Assessment/Plan: 1) Severe UC flare. 2) Severe C. Diff colitis. 3)  Anemia.   The patient has improved, but I want to underscore that he is still very ill.  I am pleased with the improvement and it is the first true improvement since admission, however, I still estimate that he will be in the hospital for another 3-5 days.  The current course of treatment is controlling and resolving his symptoms, but he cannot be discharged until his symptom resolution is durable.  Plan: 1) Continue with Vanc and Flagyl. 2) Continue with Solumedrol. 3) Continue with pain control.   LOS: 5 days   Tushar Enns D 10/03/2014, 7:47 AM

## 2014-10-03 NOTE — Progress Notes (Signed)
NUTRITION FOLLOW UP  Intervention:   -Continue Resource Breeze po TID, each supplement provides 250 kcal and 9 grams of protein  Nutrition Dx:   Inadequate oral intake related to altered GI function as evidenced by crohn's flare; progressing  Goal:   Pt will meet >90% of estimated nutritional needs; met  Monitor:   PO/supplement intake, labs, weight changes, I/O's  Assessment:   This is a 44 year old male with a PMH of Crohn's disease diagnosed in 1998 and ankylosing spondylitis admitted to the hospital for dehydration, weight loss, and a flare of his Crohn's disease. In 06/2014 he was treated for a flare of his Crohn's. In the past he was treated with Remicade, but then he lost his insurance. He was not responsive to a reintroduction of his Remicade and it was around 2008-2010 that he was diagnosed with Ankylosing Spondylitis. Humira was initiated and then his Crohn's went into remission until recently.  Pt admitted with crohn's flare-up.   S/p Procedure(s) on 09/29/14: FLEXIBLE SIGMOIDOSCOPY (N/A)  Pt asleep at time of visit; did not arouse to name being called. He has been advanced to a regular diet. Intake has improved- 50-100% meal completion. He continues on [employer] supplements, which he accepts well.  Per MD notes, pt with less abdominal pain and BMs.  Labs reviewed. K: 3.4, BUN <5, Calcium: 7.5.   Height: Ht Readings from Last 1 Encounters:  09/28/14 5' 9"  (1.753 m)    Weight Status:   Wt Readings from Last 1 Encounters:  09/28/14 164 lb 0.4 oz (74.4 kg)    Re-estimated needs:  Kcal: 2000-2200 Protein: 90-100 grams Fluid: 2.0-2.2 L  Skin: WDL  Diet Order: Diet regular   Intake/Output Summary (Last 24 hours) at 10/03/14 1201 Last data filed at 10/03/14 0626  Gross per 24 hour  Intake 5179.5 ml  Output   2200 ml  Net 2979.5 ml    Last BM: 10/02/14   Labs:   Recent Labs Lab 09/29/14 1025 10/01/14 0600 10/02/14 0835  NA 136 136 136  K  3.9 3.3* 3.4*  CL 105 106 107  CO2 25 27 26   BUN 10 <5* <5*  CREATININE 0.84 0.76 0.65  CALCIUM 7.8* 7.5* 7.5*  GLUCOSE 157* 128* 96    CBG (last 3)  No results for input(s): GLUCAP in the last 72 hours.  Scheduled Meds: . antiseptic oral rinse  7 mL Mouth Rinse BID  . feeding supplement (RESOURCE BREEZE)  1 Container Oral TID BM  . methylPREDNISolone (SOLU-MEDROL) injection  40 mg Intravenous Q12H  . metronidazole  500 mg Intravenous Q6H  . vancomycin  500 mg Oral 4 times per day    Continuous Infusions: . dextrose 5 % and 0.9 % NaCl with KCl 20 mEq/L 150 mL/hr at 10/03/14 0618    Taneya Conkel A. Jimmye Norman, RD, LDN, CDE Pager: [phone] After hours Pager: [phone]

## 2014-10-04 LAB — BASIC METABOLIC PANEL
Anion gap: 9 (ref 5–15)
CO2: 28 mmol/L (ref 19–32)
CREATININE: 0.73 mg/dL (ref 0.50–1.35)
Calcium: 8.1 mg/dL — ABNORMAL LOW (ref 8.4–10.5)
Chloride: 101 mmol/L (ref 96–112)
GLUCOSE: 110 mg/dL — AB (ref 70–99)
POTASSIUM: 4 mmol/L (ref 3.5–5.1)
SODIUM: 138 mmol/L (ref 135–145)

## 2014-10-04 LAB — CBC
HEMATOCRIT: 23.3 % — AB (ref 39.0–52.0)
HEMOGLOBIN: 7.7 g/dL — AB (ref 13.0–17.0)
MCH: 31 pg (ref 26.0–34.0)
MCHC: 33 g/dL (ref 30.0–36.0)
MCV: 94 fL (ref 78.0–100.0)
Platelets: 520 10*3/uL — ABNORMAL HIGH (ref 150–400)
RBC: 2.48 MIL/uL — ABNORMAL LOW (ref 4.22–5.81)
RDW: 16.8 % — ABNORMAL HIGH (ref 11.5–15.5)
WBC: 11.8 10*3/uL — ABNORMAL HIGH (ref 4.0–10.5)

## 2014-10-04 MED ORDER — KCL IN DEXTROSE-NACL 20-5-0.9 MEQ/L-%-% IV SOLN
INTRAVENOUS | Status: DC
  Administered 2014-10-04 – 2014-10-07 (×7): via INTRAVENOUS
  Filled 2014-10-04 (×24): qty 1000

## 2014-10-04 NOTE — Progress Notes (Signed)
Subjective: He continues to feel better.  Only four BM yesterday.  No blood and it is forming.  Objective: Vital signs in last 24 hours: Temp:  [97.6 F (36.4 C)-99.5 F (37.5 C)] 97.6 F (36.4 C) (02/24 0086) Pulse Rate:  [76-85] 76 (02/24 0608) Resp:  [18] 18 (02/24 0608) BP: (124-129)/(74-77) 126/75 mmHg (02/24 0608) SpO2:  [97 %-98 %] 97 % (02/24 0608) Last BM Date: 10/03/14  Intake/Output from previous day: 02/23 0701 - 02/24 0700 In: 960 [P.O.:960] Out: 5300 [Urine:5300] Intake/Output this shift:    General appearance: alert and no distress GI: soft, non-tender; bowel sounds normal; no masses,  no organomegaly  Lab Results:  Recent Labs  10/02/14 1113 10/04/14 0622  WBC 9.5 11.8*  HGB 7.8* 7.7*  HCT 23.6* 23.3*  PLT 466* 520*   BMET  Recent Labs  10/02/14 0835  NA 136  K 3.4*  CL 107  CO2 26  GLUCOSE 96  BUN <5*  CREATININE 0.65  CALCIUM 7.5*   LFT No results for input(s): PROT, ALBUMIN, AST, ALT, ALKPHOS, BILITOT, BILIDIR, IBILI in the last 72 hours. PT/INR No results for input(s): LABPROT, INR in the last 72 hours. Hepatitis Panel No results for input(s): HEPBSAG, HCVAB, HEPAIGM, HEPBIGM in the last 72 hours. C-Diff No results for input(s): CDIFFTOX in the last 72 hours. Fecal Lactopherrin No results for input(s): FECLLACTOFRN in the last 72 hours.  Studies/Results: No results found.  Medications:  Scheduled: . antiseptic oral rinse  7 mL Mouth Rinse BID  . feeding supplement (RESOURCE BREEZE)  1 Container Oral TID BM  . methylPREDNISolone (SOLU-MEDROL) injection  40 mg Intravenous Q12H  . metronidazole  500 mg Intravenous Q6H  . vancomycin  500 mg Oral 4 times per day   Continuous: . dextrose 5 % and 0.9 % NaCl with KCl 20 mEq/L 150 mL/hr at 10/03/14 2314    Assessment/Plan: 1) Severe C. Diff colitis. 2) Severe flare of Crohn's disease secondary to C. Diff. 3) Anemia. 4) Hypoalbuminemia.   Clinically the patient is making  excellent progress, but he is still seriously ill.  Once his stools form I will discontinue his isolation status.  Nutrition has provided him with Resource to improve his nutrition.  The hypoalbuminemia is secondary to the severe C. 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  Hypoalbuminemia is common to see with C. Diff.  WBC mildly elevated, but I do not know the significance at this time.  Clinically he is well.  Plan: 1) Continue with Vanc and Flagyl. 2) Continue with Solumedrol. 3) Continue with pain control. 4) I will consider starting his AZA in the near future.  It will be dose adjusted for his weight loss.  LOS: 6 days   Erna Brossard D 10/04/2014, 7:28 AM

## 2014-10-05 LAB — CBC
HCT: 24.3 % — ABNORMAL LOW (ref 39.0–52.0)
Hemoglobin: 7.8 g/dL — ABNORMAL LOW (ref 13.0–17.0)
MCH: 30.8 pg (ref 26.0–34.0)
MCHC: 32.1 g/dL (ref 30.0–36.0)
MCV: 96 fL (ref 78.0–100.0)
Platelets: 596 10*3/uL — ABNORMAL HIGH (ref 150–400)
RBC: 2.53 MIL/uL — AB (ref 4.22–5.81)
RDW: 16.8 % — ABNORMAL HIGH (ref 11.5–15.5)
WBC: 15.9 10*3/uL — ABNORMAL HIGH (ref 4.0–10.5)

## 2014-10-05 LAB — BASIC METABOLIC PANEL
ANION GAP: 7 (ref 5–15)
BUN: <5 mg/dL — ABNORMAL LOW (ref 6–23)
CALCIUM: 8 mg/dL — AB (ref 8.4–10.5)
CHLORIDE: 101 mmol/L (ref 96–112)
CO2: 31 mmol/L (ref 19–32)
Creatinine, Ser: 0.74 mg/dL (ref 0.50–1.35)
GFR calc non Af Amer: >90 mL/min (ref >90)
Glucose, Bld: 140 mg/dL — ABNORMAL HIGH (ref 70–99)
POTASSIUM: 4.3 mmol/L (ref 3.5–5.1)
Sodium: 139 mmol/L (ref 135–145)

## 2014-10-05 LAB — C-REACTIVE PROTEIN: CRP: 4 mg/dL — ABNORMAL HIGH (ref <0.60)

## 2014-10-05 MED ORDER — METRONIDAZOLE 500 MG PO TABS
500.0000 mg | ORAL_TABLET | Freq: Four times a day (QID) | ORAL | Status: DC
  Administered 2014-10-05 – 2014-10-08 (×13): 500 mg via ORAL
  Filled 2014-10-05 (×16): qty 1

## 2014-10-05 MED ORDER — METHYLPREDNISOLONE SODIUM SUCC 40 MG IJ SOLR
40.0000 mg | Freq: Two times a day (BID) | INTRAMUSCULAR | Status: DC
  Filled 2014-10-05 (×2): qty 1

## 2014-10-05 MED ORDER — METHYLPREDNISOLONE SODIUM SUCC 40 MG IJ SOLR: 40.0000 mg | Freq: Two times a day (BID) | INTRAMUSCULAR | Status: DC

## 2014-10-05 MED ORDER — METHYLPREDNISOLONE SODIUM SUCC 125 MG IJ SOLR
40.0000 mg | Freq: Two times a day (BID) | INTRAMUSCULAR | Status: DC
  Administered 2014-10-05: 40 mg via INTRAVENOUS
  Filled 2014-10-05 (×4): qty 0.64

## 2014-10-05 NOTE — Progress Notes (Signed)
Subjective: Four semi-formed bowel movements yesterday, but more liquid in consistency.  Feeling well.  Ate well yesterday.  Objective: Vital signs in last 24 hours: Temp:  [98.1 F (36.7 C)-99.1 F (37.3 C)] 98.4 F (36.9 C) (02/25 0539) Pulse Rate:  [59-78] 59 (02/25 0539) Resp:  [13-15] 13 (02/25 0539) BP: (121-128)/(69-70) 126/69 mmHg (02/25 0539) SpO2:  [95 %-98 %] 98 % (02/25 0539) Last BM Date: 10/03/14  Intake/Output from previous day: 02/24 0701 - 02/25 0700 In: 3534.5 [P.O.:1062; I.V.:1672.5; IV Piggyback:800] Out: 5800 [Urine:5800] Intake/Output this shift:    General appearance: alert and no distress GI: some LLQ tenderness, but improved  Lab Results:  Recent Labs  10/02/14 1113 10/04/14 0622 10/05/14 0544  WBC 9.5 11.8* PENDING  HGB 7.8* 7.7* 7.8*  HCT 23.6* 23.3* 24.3*  PLT 466* 520* 596*   BMET  Recent Labs  10/02/14 0835 10/04/14 0622 10/05/14 0544  NA 136 138 139  K 3.4* 4.0 4.3  CL 107 101 101  CO2 26 28 31   GLUCOSE 96 110* 140*  BUN <5* <5* <5*  CREATININE 0.65 0.73 0.74  CALCIUM 7.5* 8.1* 8.0*   LFT No results for input(s): PROT, ALBUMIN, AST, ALT, ALKPHOS, BILITOT, BILIDIR, IBILI in the last 72 hours. PT/INR No results for input(s): LABPROT, INR in the last 72 hours. Hepatitis Panel No results for input(s): HEPBSAG, HCVAB, HEPAIGM, HEPBIGM in the last 72 hours. C-Diff No results for input(s): CDIFFTOX in the last 72 hours. Fecal Lactopherrin No results for input(s): FECLLACTOFRN in the last 72 hours.  Studies/Results: No results found.  Medications:  Scheduled: . antiseptic oral rinse  7 mL Mouth Rinse BID  . feeding supplement (RESOURCE BREEZE)  1 Container Oral TID BM  . methylPREDNISolone (SOLU-MEDROL) injection  40 mg Intravenous Q12H  . metronidazole  500 mg Intravenous Q6H  . vancomycin  500 mg Oral 4 times per day   Continuous: . dextrose 5 % and 0.9 % NaCl with KCl 20 mEq/L 150 mL/hr at 10/04/14 2354     Assessment/Plan: 1) Severe UC. 2) Crohn's flare.   He uses morphine q3 hours, but all other clinical parameters continue to improve.  WBC is pending and it was mildly elevated yesterday.  ? Source.  Platelets also increased.  I will check his CRP again to determine if inflammation is occurring.  Plan: 1) Check CRP. 2) Follow up WBC. 3) Continue with the current medical regimen.   LOS: 7 days   Zakee Deerman D 10/05/2014, 8:13 AM

## 2014-10-06 LAB — CBC WITH DIFFERENTIAL/PLATELET
BASOS PCT: 0 % (ref 0–1)
Basophils Absolute: 0 10*3/uL (ref 0.0–0.1)
EOS PCT: 0 % (ref 0–5)
Eosinophils Absolute: 0 10*3/uL (ref 0.0–0.7)
HEMATOCRIT: 25.6 % — AB (ref 39.0–52.0)
Hemoglobin: 8.2 g/dL — ABNORMAL LOW (ref 13.0–17.0)
LYMPHS PCT: 10 % — AB (ref 12–46)
Lymphs Abs: 1.9 10*3/uL (ref 0.7–4.0)
MCH: 31.2 pg (ref 26.0–34.0)
MCHC: 32 g/dL (ref 30.0–36.0)
MCV: 97.3 fL (ref 78.0–100.0)
MONO ABS: 1.1 10*3/uL — AB (ref 0.1–1.0)
Monocytes Relative: 6 % (ref 3–12)
Neutro Abs: 15.6 10*3/uL — ABNORMAL HIGH (ref 1.7–7.7)
Neutrophils Relative %: 84 % — ABNORMAL HIGH (ref 43–77)
PLATELETS: 610 10*3/uL — AB (ref 150–400)
RBC: 2.63 MIL/uL — ABNORMAL LOW (ref 4.22–5.81)
RDW: 17 % — ABNORMAL HIGH (ref 11.5–15.5)
WBC MORPHOLOGY: INCREASED
WBC: 18.6 10*3/uL — ABNORMAL HIGH (ref 4.0–10.5)

## 2014-10-06 MED ORDER — AZATHIOPRINE 50 MG PO TABS
185.0000 mg | ORAL_TABLET | Freq: Every day | ORAL | Status: DC
  Administered 2014-10-06 – 2014-10-10 (×5): 175 mg via ORAL
  Filled 2014-10-06 (×6): qty 4

## 2014-10-06 MED ORDER — PREDNISONE 20 MG PO TABS
40.0000 mg | ORAL_TABLET | Freq: Every day | ORAL | Status: DC
  Administered 2014-10-06 – 2014-10-10 (×5): 40 mg via ORAL
  Filled 2014-10-06 (×6): qty 2

## 2014-10-06 NOTE — Progress Notes (Signed)
Subjective: One significantly blood bowel movement yesterday.  Otherwise he had three semi formed stools.  Objective: Vital signs in last 24 hours: Temp:  [97.8 F (36.6 C)-99.2 F (37.3 C)] 98.3 F (36.8 C) (02/26 0653) Pulse Rate:  [66-74] 66 (02/26 0653) Resp:  [12-16] 12 (02/26 0653) BP: (125-133)/(76-85) 133/85 mmHg (02/26 0653) SpO2:  [97 %-100 %] 97 % (02/26 0653) Last BM Date: 10/05/14  Intake/Output from previous day: 02/25 0701 - 02/26 0700 In: 2592.5 [P.O.:840; I.V.:1752.5] Out: 2800 [Urine:2800] Intake/Output this shift:    General appearance: alert and no distress GI: tender in the LLQ, no rebound or rigidity  Lab Results:  Recent Labs  10/04/14 0622 10/05/14 0544 10/06/14 0526  WBC 11.8* 15.9* 18.6*  HGB 7.7* 7.8* 8.2*  HCT 23.3* 24.3* 25.6*  PLT 520* 596* 610*   BMET  Recent Labs  10/04/14 0622 10/05/14 0544  NA 138 139  K 4.0 4.3  CL 101 101  CO2 28 31  GLUCOSE 110* 140*  BUN <5* <5*  CREATININE 0.73 0.74  CALCIUM 8.1* 8.0*   LFT No results for input(s): PROT, ALBUMIN, AST, ALT, ALKPHOS, BILITOT, BILIDIR, IBILI in the last 72 hours. PT/INR No results for input(s): LABPROT, INR in the last 72 hours. Hepatitis Panel No results for input(s): HEPBSAG, HCVAB, HEPAIGM, HEPBIGM in the last 72 hours. C-Diff No results for input(s): CDIFFTOX in the last 72 hours. Fecal Lactopherrin No results for input(s): FECLLACTOFRN in the last 72 hours.  Studies/Results: No results found.  Medications:  Scheduled: . antiseptic oral rinse  7 mL Mouth Rinse BID  . azaTHIOprine  175 mg Oral Daily  . feeding supplement (RESOURCE BREEZE)  1 Container Oral TID BM  . metroNIDAZOLE  500 mg Oral 4 times per day  . [START ON 10/07/2014] predniSONE  40 mg Oral Q breakfast  . vancomycin  500 mg Oral 4 times per day   Continuous: . dextrose 5 % and 0.9 % NaCl with KCl 20 mEq/L 150 mL/hr at 10/06/14 0511    Assessment/Plan: 1) Severe C. Diff. 2) Crohn's  disease.   His WBC continues to increase, but clinically, outside of the one bloody bowel movement, he is well.  His platelets have also increased.  Interestingly his CRP continues to decline, however, I doubt that the increase in his WBC is as a result of the steroids.  He was on steroids and Humira for his Crohn's in the recent past without any benefit.  It was when AZA was started that he was able to go into remission and maintain it for a short period of time until the C. Diff infection.    Plan: 1) Change Solumedrol to prednisone 40 mg. 2) Continue with Vanc and Flagyl. 3) Okay to get his home Humira dosing today. 4) Restart AZA at 2.5 mg/kg (Approximately 175 mg).    LOS: 8 days   Rosalva Neary D 10/06/2014, 10:21 AM

## 2014-10-07 LAB — COMPREHENSIVE METABOLIC PANEL
ALBUMIN: 1.9 g/dL — AB (ref 3.5–5.2)
ALK PHOS: 65 U/L (ref 39–117)
ALT: 9 U/L (ref 0–53)
AST: 21 U/L (ref 0–37)
Anion gap: 7 (ref 5–15)
BUN: <5 mg/dL — ABNORMAL LOW (ref 6–23)
CALCIUM: 8.1 mg/dL — AB (ref 8.4–10.5)
CHLORIDE: 95 mmol/L — AB (ref 96–112)
CO2: 35 mmol/L — ABNORMAL HIGH (ref 19–32)
CREATININE: 0.81 mg/dL (ref 0.50–1.35)
GFR calc Af Amer: >90 mL/min (ref >90)
GFR calc non Af Amer: >90 mL/min (ref >90)
Glucose, Bld: 100 mg/dL — ABNORMAL HIGH (ref 70–99)
POTASSIUM: 4.1 mmol/L (ref 3.5–5.1)
SODIUM: 137 mmol/L (ref 135–145)
TOTAL PROTEIN: 5.8 g/dL — AB (ref 6.0–8.3)
Total Bilirubin: 0.3 mg/dL (ref 0.3–1.2)

## 2014-10-07 LAB — CBC
HCT: 29.8 % — ABNORMAL LOW (ref 39.0–52.0)
Hemoglobin: 9.3 g/dL — ABNORMAL LOW (ref 13.0–17.0)
MCH: 30.1 pg (ref 26.0–34.0)
MCHC: 31.2 g/dL (ref 30.0–36.0)
MCV: 96.4 fL (ref 78.0–100.0)
Platelets: 785 10*3/uL — ABNORMAL HIGH (ref 150–400)
RBC: 3.09 MIL/uL — ABNORMAL LOW (ref 4.22–5.81)
RDW: 17.1 % — ABNORMAL HIGH (ref 11.5–15.5)
WBC: 20 10*3/uL — ABNORMAL HIGH (ref 4.0–10.5)

## 2014-10-07 NOTE — Progress Notes (Signed)
Subjective: Two clear loose bowel movements yesterday.    Objective: Vital signs in last 24 hours: Temp:  [98.1 F (36.7 C)-98.7 F (37.1 C)] 98.7 F (37.1 C) (02/27 0549) Pulse Rate:  [66-91] 91 (02/27 0549) Resp:  [16-18] 18 (02/27 0549) BP: (114-130)/(70-78) 114/76 mmHg (02/27 0549) SpO2:  [94 %-99 %] 96 % (02/27 0549) Last BM Date: 10/06/14  Intake/Output from previous day: 02/26 0701 - 02/27 0700 In: 420 [P.O.:420] Out: 1800 [Urine:1800] Intake/Output this shift:    General appearance: alert and no distress GI: tender in the LLQ, but not severe  Lab Results:  Recent Labs  10/05/14 0544 10/06/14 0526 10/07/14 0525  WBC 15.9* 18.6* 20.0*  HGB 7.8* 8.2* 9.3*  HCT 24.3* 25.6* 29.8*  PLT 596* 610* 785*   BMET  Recent Labs  10/05/14 0544 10/07/14 0525  NA 139 137  K 4.3 4.1  CL 101 95*  CO2 31 35*  GLUCOSE 140* 100*  BUN <5* <5*  CREATININE 0.74 0.81  CALCIUM 8.0* 8.1*   LFT  Recent Labs  10/07/14 0525  PROT 5.8*  ALBUMIN 1.9*  AST 21  ALT 9  ALKPHOS 65  BILITOT 0.3   PT/INR No results for input(s): LABPROT, INR in the last 72 hours. Hepatitis Panel No results for input(s): HEPBSAG, HCVAB, HEPAIGM, HEPBIGM in the last 72 hours. C-Diff No results for input(s): CDIFFTOX in the last 72 hours. Fecal Lactopherrin No results for input(s): FECLLACTOFRN in the last 72 hours.  Studies/Results: No results found.  Medications:  Scheduled: . antiseptic oral rinse  7 mL Mouth Rinse BID  . azaTHIOprine  175 mg Oral Daily  . feeding supplement (RESOURCE BREEZE)  1 Container Oral TID BM  . metroNIDAZOLE  500 mg Oral 4 times per day  . predniSONE  40 mg Oral Q breakfast  . vancomycin  500 mg Oral 4 times per day   Continuous: . dextrose 5 % and 0.9 % NaCl with KCl 20 mEq/L 150 mL/hr at 10/07/14 0175    Assessment/Plan: 1) Severe C. Diff.  Day #9 out of a 10 day treatment course. 2) Crohn's disease.   Clinically he appears well and he  continues to make daily improvements.  I am thinking that his WBC and Plt increase is harbinger of his Crohn's flaring, but it is difficult to determine.  He did take his Humira yesterday and I started him back on AZA.    Plan: 1) Continue with Vanc and Flagyl. 2) Continue with pain control. 3) Prednisone and AZA for his Crohn's disease. 4) D/C IV fluids. 5) Check CRP in the AM with the increasing WBC/Plt.  LOS: 9 days   Vincent Black D 10/07/2014, 8:37 AM

## 2014-10-08 DIAGNOSIS — R634 Abnormal weight loss: Secondary | ICD-10-CM | POA: Diagnosis present

## 2014-10-08 DIAGNOSIS — D75839 Thrombocytosis, unspecified: Secondary | ICD-10-CM | POA: Diagnosis present

## 2014-10-08 DIAGNOSIS — D649 Anemia, unspecified: Secondary | ICD-10-CM | POA: Diagnosis present

## 2014-10-08 DIAGNOSIS — D473 Essential (hemorrhagic) thrombocythemia: Secondary | ICD-10-CM | POA: Diagnosis present

## 2014-10-08 DIAGNOSIS — R3911 Hesitancy of micturition: Secondary | ICD-10-CM

## 2014-10-08 DIAGNOSIS — R300 Dysuria: Secondary | ICD-10-CM

## 2014-10-08 DIAGNOSIS — D72829 Elevated white blood cell count, unspecified: Secondary | ICD-10-CM | POA: Diagnosis present

## 2014-10-08 LAB — BASIC METABOLIC PANEL
Anion gap: 9 (ref 5–15)
BUN: <5 mg/dL — ABNORMAL LOW (ref 6–23)
CHLORIDE: 92 mmol/L — AB (ref 96–112)
CO2: 34 mmol/L — ABNORMAL HIGH (ref 19–32)
Calcium: 8.7 mg/dL (ref 8.4–10.5)
Creatinine, Ser: 1 mg/dL (ref 0.50–1.35)
GFR calc Af Amer: >90 mL/min (ref >90)
GFR calc non Af Amer: >90 mL/min (ref >90)
Glucose, Bld: 86 mg/dL (ref 70–99)
Potassium: 4.1 mmol/L (ref 3.5–5.1)
Sodium: 135 mmol/L (ref 135–145)

## 2014-10-08 LAB — URINALYSIS, ROUTINE W REFLEX MICROSCOPIC
Bilirubin Urine: NEGATIVE
GLUCOSE, UA: NEGATIVE mg/dL
Hgb urine dipstick: NEGATIVE
KETONES UR: NEGATIVE mg/dL
LEUKOCYTES UA: NEGATIVE
NITRITE: NEGATIVE
Protein, ur: NEGATIVE mg/dL
Specific Gravity, Urine: 1.009 (ref 1.005–1.030)
Urobilinogen, UA: 0.2 mg/dL (ref 0.0–1.0)
pH: 7.5 (ref 5.0–8.0)

## 2014-10-08 LAB — CBC
HEMATOCRIT: 30.5 % — AB (ref 39.0–52.0)
Hemoglobin: 9.6 g/dL — ABNORMAL LOW (ref 13.0–17.0)
MCH: 30.4 pg (ref 26.0–34.0)
MCHC: 31.5 g/dL (ref 30.0–36.0)
MCV: 96.5 fL (ref 78.0–100.0)
Platelets: 791 10*3/uL — ABNORMAL HIGH (ref 150–400)
RBC: 3.16 MIL/uL — AB (ref 4.22–5.81)
RDW: 16.6 % — AB (ref 11.5–15.5)
WBC: 28.6 10*3/uL — ABNORMAL HIGH (ref 4.0–10.5)

## 2014-10-08 LAB — C-REACTIVE PROTEIN: CRP: 4.3 mg/dL — ABNORMAL HIGH (ref <0.60)

## 2014-10-08 MED ORDER — HYDROMORPHONE HCL 2 MG PO TABS
2.0000 mg | ORAL_TABLET | ORAL | Status: DC | PRN
  Administered 2014-10-08 – 2014-10-10 (×16): 2 mg via ORAL
  Filled 2014-10-08 (×16): qty 1

## 2014-10-08 NOTE — Progress Notes (Signed)
Subjective: Increase in pain yesterday and this morning.  Two nearly formed bowel movements.    Objective: Vital signs in last 24 hours: Temp:  [97.8 F (36.6 C)-99.2 F (37.3 C)] 98.2 F (36.8 C) (02/28 0647) Pulse Rate:  [65-76] 76 (02/28 0647) Resp:  [12-18] 12 (02/28 0647) BP: (117-132)/(71-75) 118/75 mmHg (02/28 0647) SpO2:  [97 %-100 %] 97 % (02/28 0647) Last BM Date: 10/07/14  Intake/Output from previous day: 02/27 0701 - 02/28 0700 In: 1080 [P.O.:1080] Out: 600 [Urine:600] Intake/Output this shift:    General appearance: alert and no distress GI: tender in the LLQ  Lab Results:  Recent Labs  10/06/14 0526 10/07/14 0525  WBC 18.6* 20.0*  HGB 8.2* 9.3*  HCT 25.6* 29.8*  PLT 610* 785*   BMET  Recent Labs  10/07/14 0525  NA 137  K 4.1  CL 95*  CO2 35*  GLUCOSE 100*  BUN <5*  CREATININE 0.81  CALCIUM 8.1*   LFT  Recent Labs  10/07/14 0525  PROT 5.8*  ALBUMIN 1.9*  AST 21  ALT 9  ALKPHOS 65  BILITOT 0.3   PT/INR No results for input(s): LABPROT, INR in the last 72 hours. Hepatitis Panel No results for input(s): HEPBSAG, HCVAB, HEPAIGM, HEPBIGM in the last 72 hours. C-Diff No results for input(s): CDIFFTOX in the last 72 hours. Fecal Lactopherrin No results for input(s): FECLLACTOFRN in the last 72 hours.  Studies/Results: No results found.  Medications:  Scheduled: . antiseptic oral rinse  7 mL Mouth Rinse BID  . azaTHIOprine  175 mg Oral Daily  . feeding supplement (RESOURCE BREEZE)  1 Container Oral TID BM  . metroNIDAZOLE  500 mg Oral 4 times per day  . predniSONE  40 mg Oral Q breakfast  . vancomycin  500 mg Oral 4 times per day   Continuous: . dextrose 5 % and 0.9 % NaCl with KCl 20 mEq/L Stopped (10/07/14 0901)    Assessment/Plan: 1) Severe C. Diff. 2) Crohn's disease. 3) Leukocytosis.   I do not know why he has an increase in his WBC.  CRP is pending.  I will ask ID to see if they have any thoughts on the matter.   Clinically he is well outside of a worsening of his pain last evening.  If the pain persists I will repeat a CT scan of the ABM/Pelvis.  Plan: 1) Last day for Vanc and Flagyl. 2) Pain control. 3) Continue with prednisone and AZA. 4) ID consult for the leukocytosis.  LOS: 10 days   Devann Cribb D 10/08/2014, 8:53 AM

## 2014-10-08 NOTE — Consult Note (Signed)
[facility]    Date of Admission:  09/28/2014           Day 10 oral vancomycin        Day 4 oral metronidazole        Last Humira injection 10/06/2014       Reason for Consult: Increasing leukocytosis in setting of improving C. difficile colitis    Referring Physician: Dr. Carol Ada  Principal Problem:   C. difficile colitis Active Problems:   Leukocytosis   Crohn's disease   Protein-calorie malnutrition, severe   Ankylosing spondylitis   Unintentional weight loss   Normocytic anemia   Thrombocytosis   . antiseptic oral rinse  7 mL Mouth Rinse BID  . azaTHIOprine  175 mg Oral Daily  . feeding supplement (RESOURCE BREEZE)  1 Container Oral TID BM  . metroNIDAZOLE  500 mg Oral 4 times per day  . predniSONE  40 mg Oral Q breakfast  . vancomycin  500 mg Oral 4 times per day    Recommendations: 1. Continue oral vancomycin 2. Discontinue oral metronidazole 3. Check UA and urine culture   Assessment: I am not entirely clear why his leukocytosis is worsening. Certainly C. difficile colitis is a common cause of leukemoid reactions and the relationship of white blood cell count level to the clinical course is not always linear. He is also been on higher doses of steroids and may have some degree of white blood cell count over shoot related to his recent Solu-Medrol. It is also theoretically possible that he could have some worsening due to recent Humira therapy. He also has some new urinary hesitancy and dysuria. I will check urine studies to see if he has any evidence of a superimposed bladder infection. I will continue oral vancomycin. I doubt that the oral metronidazole is adding much to his treatment other than side effects so I will stop it now.    HPI: Vincent Black is a 44 y.o. male with a history of Crohn's disease and ankylosing spondylitis. He has been on Humira for over 5 years and had good control of both his Crohn's and  ankylosing spondylitis until he began to have worsening diarrhea late last November. This was initially attributed to his Crohn's disease. He was started on azathioprine and prednisone. He got a little bit better but then his diarrhea became much worse in January. He was diagnosed with his first episode of C. difficile colitis and started on oral metronidazole but did not have any improvement. He developed severe anorexia and 50-60 pounds of unintentional weight loss. He became very dehydrated and had several syncopal episodes leading to his hospitalization 10 days ago. He has been on oral vancomycin. He received IV metronidazole for 4 days and then was switched to oral metronidazole in addition to the oral vancomycin.  His diarrhea has improved significantly. He is started having formed stools. His appetite is improving. His white blood cell count was 18,400 on admission and went down to 9500 on 10/02/2014. Since that time it is risen steadily to 28,600. After admission he was placed on IV Solu-Medrol but is now back to his baseline 40 mg of prednisone daily. He is still having some lower abdominal pain worse on the left than on the right. Over the past 48 hours he has also noted a return of cold sweats like he was having prior to admission. He has not had any documented fevers.  Over the past few days he's also noted some urinary hesitancy and slight dysuria. He has not had any vomiting but notes some nausea that he attributes to metronidazole. The past few months he has noticed that he tends to feel worse right after he receives his Humira injections. His last injection was 48 hours ago.    Review of Systems: Constitutional: positive for anorexia, chills, sweats and weight loss, negative for fevers Eyes: negative Ears, nose, mouth, throat, and face: negative Respiratory: negative Cardiovascular: negative Gastrointestinal: positive for abdominal pain, diarrhea and nausea, negative for constipation,  reflux symptoms and vomiting Genitourinary:positive for dysuria and hesitancy, negative for frequency and hematuria  Past Medical History  Diagnosis Date  . Crohn disease   . C. difficile colitis 09/30/2014  . Ankylosing spondylitis 09/30/2014    History  Substance Use Topics  . Smoking status: Former Research scientist (life sciences)  . Smokeless tobacco: Never Used  . Alcohol Use: Yes     Comment: social    History reviewed. No pertinent family history. No Known Allergies  OBJECTIVE: Blood pressure 126/81, pulse 80, temperature 97.7 F (36.5 C), temperature source Oral, resp. rate 18, height 5\' 9"  (1.753 m), weight 164 lb 0.4 oz (74.4 kg), SpO2 100 %.   General: He is alert, comfortable and in no distress. He is visiting with family. Skin: Pale with no rash Lymph nodes: No palpable adenopathy Oral: No oropharyngeal lesions Lungs: Clear Cor: Regular S1 and S2 with no murmurs Abdomen: Soft with very quiet bowel sounds. He has lower quadrant tenderness worse on the left than in the midline or on the right. There are no palpable masses Extremities: No acute abnormality  Lab Results Lab Results  Component Value Date   WBC 28.6* 10/08/2014   HGB 9.6* 10/08/2014   HCT 30.5* 10/08/2014   MCV 96.5 10/08/2014   PLT 791* 10/08/2014    Lab Results  Component Value Date   CREATININE 1.00 10/08/2014   BUN <5* 10/08/2014   NA 135 10/08/2014   K 4.1 10/08/2014   CL 92* 10/08/2014   CO2 34* 10/08/2014    Lab Results  Component Value Date   ALT 9 10/07/2014   AST 21 10/07/2014   ALKPHOS 65 10/07/2014   BILITOT 0.3 10/07/2014     Microbiology: Recent Results (from the past 240 hour(s))  Clostridium Difficile by PCR     Status: Abnormal   Collection Time: 09/28/14  5:52 PM  Result Value Ref Range Status   C difficile by pcr POSITIVE (A) NEGATIVE Final    Comment: CRITICAL RESULT CALLED TO, READ BACK BY AND VERIFIED WITH: Mordecai Rasmussen RN 10:10 09/29/14 ([email or web address])     Michel Bickers, MD [place]  [facility] [phone] pager   [phone] cell 10/08/2014, 2:12 PM

## 2014-10-09 LAB — BASIC METABOLIC PANEL
Anion gap: 5 (ref 5–15)
BUN: 8 mg/dL (ref 6–23)
CO2: 34 mmol/L — ABNORMAL HIGH (ref 19–32)
Calcium: 8.4 mg/dL (ref 8.4–10.5)
Chloride: 96 mmol/L (ref 96–112)
Creatinine, Ser: 1.02 mg/dL (ref 0.50–1.35)
GFR calc Af Amer: >90 mL/min (ref >90)
GFR, EST NON AFRICAN AMERICAN: 89 mL/min — AB (ref >90)
Glucose, Bld: 102 mg/dL — ABNORMAL HIGH (ref 70–99)
Potassium: 3.8 mmol/L (ref 3.5–5.1)
SODIUM: 135 mmol/L (ref 135–145)

## 2014-10-09 LAB — CBC
HCT: 28.4 % — ABNORMAL LOW (ref 39.0–52.0)
HEMOGLOBIN: 9.2 g/dL — AB (ref 13.0–17.0)
MCH: 30.6 pg (ref 26.0–34.0)
MCHC: 32.4 g/dL (ref 30.0–36.0)
MCV: 94.4 fL (ref 78.0–100.0)
Platelets: 669 10*3/uL — ABNORMAL HIGH (ref 150–400)
RBC: 3.01 MIL/uL — AB (ref 4.22–5.81)
RDW: 16.4 % — ABNORMAL HIGH (ref 11.5–15.5)
WBC: 22.1 10*3/uL — ABNORMAL HIGH (ref 4.0–10.5)

## 2014-10-09 MED ORDER — MORPHINE SULFATE ER 30 MG PO TBCR
30.0000 mg | EXTENDED_RELEASE_TABLET | Freq: Two times a day (BID) | ORAL | Status: DC
Start: 2014-10-09 — End: 2014-10-10
  Administered 2014-10-09 – 2014-10-10 (×3): 30 mg via ORAL
  Filled 2014-10-09 (×3): qty 1

## 2014-10-09 NOTE — Progress Notes (Signed)
Subjective: Pain level is almost the same as his admission pain, but mostly it is with his bowel movements.  He had a couple of formed bowel movements yesterday.  Objective: Vital signs in last 24 hours: Temp:  [97.7 F (36.5 C)-99 F (37.2 C)] 99 F (37.2 C) (02/29 0656) Pulse Rate:  [77-99] 99 (02/29 0656) Resp:  [12-18] 13 (02/29 0656) BP: (122-130)/(80-81) 122/81 mmHg (02/29 0656) SpO2:  [97 %-100 %] 97 % (02/29 0656) Last BM Date: 10/08/14  Intake/Output from previous day: 02/28 0701 - 02/29 0700 In: 1320 [P.O.:1320] Out: 1000 [Urine:1000] Intake/Output this shift:    General appearance: alert and no distress GI: tender in the LLQ  Lab Results:  Recent Labs  10/07/14 0525 10/08/14 0855 10/09/14 0535  WBC 20.0* 28.6* 22.1*  HGB 9.3* 9.6* 9.2*  HCT 29.8* 30.5* 28.4*  PLT 785* 791* 669*   BMET  Recent Labs  10/07/14 0525 10/08/14 0855 10/09/14 0535  NA 137 135 135  K 4.1 4.1 3.8  CL 95* 92* 96  CO2 35* 34* 34*  GLUCOSE 100* 86 102*  BUN <5* <5* 8  CREATININE 0.81 1.00 1.02  CALCIUM 8.1* 8.7 8.4   LFT  Recent Labs  10/07/14 0525  PROT 5.8*  ALBUMIN 1.9*  AST 21  ALT 9  ALKPHOS 65  BILITOT 0.3   PT/INR No results for input(s): LABPROT, INR in the last 72 hours. Hepatitis Panel No results for input(s): HEPBSAG, HCVAB, HEPAIGM, HEPBIGM in the last 72 hours. C-Diff No results for input(s): CDIFFTOX in the last 72 hours. Fecal Lactopherrin No results for input(s): FECLLACTOFRN in the last 72 hours.  Studies/Results: No results found.  Medications:  Scheduled: . azaTHIOprine  175 mg Oral Daily  . feeding supplement (RESOURCE BREEZE)  1 Container Oral TID BM  . morphine  30 mg Oral Q12H  . predniSONE  40 mg Oral Q breakfast  . vancomycin  500 mg Oral 4 times per day   Continuous: . dextrose 5 % and 0.9 % NaCl with KCl 20 mEq/L Stopped (10/07/14 0901)    Assessment/Plan: 1) Severe C. Diff. 2) Crohn's disease. 3) Leukocytosis -  improving.   I am appreciative of Dr. Hale Bogus input.  His WBC has decreased.  UA negative.  Clinically he is well aside from his pain.  I will increase his pain control with MS Contin.  Plan: 1) MS Contin 30 mg Q12 hours. 2) Continue with Prednisone and AZA. 3) Continue with Vanc.  Duration of therapy per ID. 4) I will anticipate on D/C tomorrow if okay with ID.    LOS: 11 days   Vincent Black D 10/09/2014, 8:25 AM

## 2014-10-09 NOTE — Progress Notes (Signed)
NUTRITION FOLLOW UP  Intervention:   -D/c Resource Breeze po TID, each supplement provides 250 kcal and 9 grams of protein  Nutrition Dx:   Inadequate oral intake related to altered GI function as evidenced by crohn's flare; progressing  Goal:   Pt will meet >90% of estimated nutritional needs; met  Monitor:   PO/supplement intake, labs, weight changes, I/O's  Assessment:   This is a 44 year old male with a PMH of Crohn's disease diagnosed in 1998 and ankylosing spondylitis admitted to the hospital for dehydration, weight loss, and a flare of his Crohn's disease. In 06/2014 he was treated for a flare of his Crohn's. In the past he was treated with Remicade, but then he lost his insurance. He was not responsive to a reintroduction of his Remicade and it was around 2008-2010 that he was diagnosed with Ankylosing Spondylitis. Humira was initiated and then his Crohn's went into remission until recently.  Pt admitted with crohn's flare-up.   S/p Procedure(s) on 09/29/14: FLEXIBLE SIGMOIDOSCOPY (N/A)  Pt continues on a regular diet. Intake remains good; PO: 90-100%. He continues on [employer] supplements, however, pt has been refusing supplements over the past two days. RD will d/c due to improved PO intake.  Per MD notes, pt will likely d/c home tomorrow. His main issue is pain control, mainly with bowel movements.  Labs reviewed. CO2: 34, Glucose: 102.   Height: Ht Readings from Last 1 Encounters:  09/28/14 _0  (1.753 m)    Weight Status:   Wt Readings from Last 1 Encounters:  09/28/14 164 lb 0.4 oz (74.4 kg)    Re-estimated needs:  Kcal: 2000-2200 Protein: 90-100 grams Fluid: 2.0-2.2 L  Skin: WDL  Diet Order: Diet regular   Intake/Output Summary (Last 24 hours) at 10/09/14 1213 Last data filed at 10/09/14 0440  Gross per 24 hour  Intake   1080 ml  Output    700 ml  Net    380 ml    Last BM: 10/02/14   Labs:   Recent Labs Lab 10/07/14 0525  10/08/14 0855 10/09/14 0535  NA 137 135 135  K 4.1 4.1 3.8  CL 95* 92* 96  CO2 35* 34* 34*  BUN <5* <5* 8  CREATININE 0.81 1.00 1.02  CALCIUM 8.1* 8.7 8.4  GLUCOSE 100* 86 102*    CBG (last 3)  No results for input(s): GLUCAP in the last 72 hours.  Scheduled Meds: . azaTHIOprine  175 mg Oral Daily  . feeding supplement (RESOURCE BREEZE)  1 Container Oral TID BM  . morphine  30 mg Oral Q12H  . predniSONE  40 mg Oral Q breakfast  . vancomycin  500 mg Oral 4 times per day    Continuous Infusions:    Teryl Gubler A. Jimmye Norman, RD, LDN, CDE Pager: [phone] After hours Pager: [phone]

## 2014-10-09 NOTE — Progress Notes (Signed)
Patient ID: Vincent Black, male   DOB: 06/23/1971, 44 y.o.   MRN: [id number]         [facility] for Infectious Disease    Date of Admission:  09/28/2014           Day 11 oral vancomycin  Principal Problem:   C. difficile colitis Active Problems:   Leukocytosis   Crohn's disease   Protein-calorie malnutrition, severe   Ankylosing spondylitis   Unintentional weight loss   Normocytic anemia   Thrombocytosis   . azaTHIOprine  175 mg Oral Daily  . morphine  30 mg Oral Q12H  . predniSONE  40 mg Oral Q breakfast  . vancomycin  500 mg Oral 4 times per day    Subjective: She is feeling better today. He is having less pain and he more. His nausea has improved. His urinary hesitancy and mild dysuria are markedly improved as well.  Review of Systems: Pertinent items are noted in HPI.  Past Medical History  Diagnosis Date  . Crohn disease   . C. difficile colitis 09/30/2014  . Ankylosing spondylitis 09/30/2014    History  Substance Use Topics  . Smoking status: Former Research scientist (life sciences)  . Smokeless tobacco: Never Used  . Alcohol Use: Yes     Comment: social    History reviewed. No pertinent family history. No Known Allergies  OBJECTIVE: Blood pressure 122/81, pulse 99, temperature 99 F (37.2 C), temperature source Oral, resp. rate 13, height 5\' 9"  (1.753 m), weight 164 lb 0.4 oz (74.4 kg), SpO2 97 %. General: smiling and in good spirits Skin: no rash Lungs: clear Cor: regular S1 and S2 with no murmur Abdomen: soft and nontender  Lab Results Lab Results  Component Value Date   WBC 22.1* 10/09/2014   HGB 9.2* 10/09/2014   HCT 28.4* 10/09/2014   MCV 94.4 10/09/2014   PLT 669* 10/09/2014    Lab Results  Component Value Date   CREATININE 1.02 10/09/2014   BUN 8 10/09/2014   NA 135 10/09/2014   K 3.8 10/09/2014   CL 96 10/09/2014   CO2 34* 10/09/2014    Lab Results  Component Value Date   ALT 9 10/07/2014   AST 21 10/07/2014   ALKPHOS 65 10/07/2014   BILITOT  0.3 10/07/2014    Urinalysis: Negative  Microbiology: No results found for this or any previous visit (from the past 240 hour(s)).  Assessment: He is improving slowly on therapy for C. Difficile colitis. I recommend continuing oral vancomycin for 3 more days.   Plan: 1. Continue vancomycin for 3 more days  Michel Bickers, MD [facility] for [place] [phone] pager   [phone] cell 10/09/2014, 2:59 PM

## 2014-10-10 DIAGNOSIS — K50919 Crohn's disease, unspecified, with unspecified complications: Secondary | ICD-10-CM

## 2014-10-10 LAB — CBC
HEMATOCRIT: 30.8 % — AB (ref 39.0–52.0)
HEMOGLOBIN: 9.8 g/dL — AB (ref 13.0–17.0)
MCH: 30.7 pg (ref 26.0–34.0)
MCHC: 31.8 g/dL (ref 30.0–36.0)
MCV: 96.6 fL (ref 78.0–100.0)
Platelets: 698 10*3/uL — ABNORMAL HIGH (ref 150–400)
RBC: 3.19 MIL/uL — ABNORMAL LOW (ref 4.22–5.81)
RDW: 16.1 % — ABNORMAL HIGH (ref 11.5–15.5)
WBC: 26.7 10*3/uL — ABNORMAL HIGH (ref 4.0–10.5)

## 2014-10-10 LAB — BASIC METABOLIC PANEL
ANION GAP: 11 (ref 5–15)
BUN: 6 mg/dL (ref 6–23)
CO2: 31 mmol/L (ref 19–32)
CREATININE: 0.99 mg/dL (ref 0.50–1.35)
Calcium: 8.3 mg/dL — ABNORMAL LOW (ref 8.4–10.5)
Chloride: 92 mmol/L — ABNORMAL LOW (ref 96–112)
GFR calc non Af Amer: >90 mL/min (ref >90)
Glucose, Bld: 108 mg/dL — ABNORMAL HIGH (ref 70–99)
Potassium: 4.1 mmol/L (ref 3.5–5.1)
Sodium: 134 mmol/L — ABNORMAL LOW (ref 135–145)

## 2014-10-10 LAB — URINE CULTURE
Colony Count: NO GROWTH
Culture: NO GROWTH

## 2014-10-10 MED ORDER — MORPHINE SULFATE ER 30 MG PO TBCR: 30.0000 mg | EXTENDED_RELEASE_TABLET | Freq: Two times a day (BID) | ORAL | Status: DC

## 2014-10-10 MED ORDER — VANCOMYCIN 50 MG/ML ORAL SOLUTION: 500.0000 mg | Freq: Four times a day (QID) | ORAL | Status: DC

## 2014-10-10 MED ORDER — ONDANSETRON HCL 4 MG PO TABS
4.0000 mg | ORAL_TABLET | Freq: Three times a day (TID) | ORAL | Status: DC | PRN
  Administered 2014-10-10: 4 mg via ORAL
  Filled 2014-10-10: qty 1

## 2014-10-10 MED ORDER — PREDNISONE 20 MG PO TABS: 40.0000 mg | ORAL_TABLET | Freq: Every day | ORAL | Status: DC

## 2014-10-10 MED ORDER — AZATHIOPRINE 50 MG PO TABS: 185.0000 mg | ORAL_TABLET | Freq: Every day | ORAL | Status: DC

## 2014-10-10 NOTE — Progress Notes (Signed)
Patient ID: Vincent Black, male   DOB: [birth year], 44 y.o.   MRN: [id number]         [facility] for Infectious Disease    Date of Admission:  09/28/2014           Day 12 oral vancomycin  Principal Problem:   C. difficile colitis Active Problems:   Crohn's disease   Protein-calorie malnutrition, severe   Ankylosing spondylitis   Unintentional weight loss   Normocytic anemia   Leukocytosis   Thrombocytosis   . azaTHIOprine  175 mg Oral Daily  . morphine  30 mg Oral Q12H  . predniSONE  40 mg Oral Q breakfast  . vancomycin  500 mg Oral 4 times per day    Subjective:  He is having less pain  His nausea has improved. BM improved, but still having 4-5 semi formed stools per day. His appetite is starting to improve. Some mild abdominal pain  24hr event = still having increased leukocytosis    Past Medical History  Diagnosis Date  . Crohn disease   . C. difficile colitis 09/30/2014  . Ankylosing spondylitis 09/30/2014    History  Substance Use Topics  . Smoking status: Former Research scientist (life sciences)  . Smokeless tobacco: Never Used  . Alcohol Use: Yes     Comment: social    History reviewed. No pertinent family history. No Known Allergies  OBJECTIVE: Blood pressure 129/93, pulse 100, temperature 98.7 F (37.1 C), temperature source Oral, resp. rate 18, height 5\' 9"  (1.753 m), weight 164 lb 0.4 oz (74.4 kg), SpO2 98 %. General: smiling and in good spirits sitting in bed in NAD Skin: no rash Lungs: clear Cor: regular S1 and S2 with no murmur Abdomen: soft and nontender  Lab Results Lab Results  Component Value Date   WBC 26.7* 10/10/2014   HGB 9.8* 10/10/2014   HCT 30.8* 10/10/2014   MCV 96.6 10/10/2014   PLT 698* 10/10/2014    Lab Results  Component Value Date   CREATININE 0.99 10/10/2014   BUN 6 10/10/2014   NA 134* 10/10/2014   K 4.1 10/10/2014   CL 92* 10/10/2014   CO2 31 10/10/2014    Lab Results  Component Value Date   ALT 9 10/07/2014   AST 21  10/07/2014   ALKPHOS 65 10/07/2014   BILITOT 0.3 10/07/2014    Urinalysis: Negative  Microbiology: Recent Results (from the past 240 hour(s))  Culture, Urine     Status: None   Collection Time: 10/08/14  4:33 PM  Result Value Ref Range Status   Specimen Description URINE, CLEAN CATCH  Final   Special Requests Immunocompromised  Final   Colony Count NO GROWTH Performed at [facility]   Final   Culture NO GROWTH Performed at [facility]   Final   Report Status 10/10/2014 FINAL  Final    Assessment: 44yo M with crohn's disease on humira who has protracted C. Difficile colitis currently on high dose oral vancomycin. This being a severe case of c.difficile, though his first one. Will plan for oral vancomycin taper  Plan: 1. Continue vancomycin for 3 more days at current dose, followed by 125mg  QID x 7days, then 125mg  TID x 7days, then 125mg  BID x 7days, then 125mg  daily x 7 days, then every 3 days x 4 dose 2. Will follow up in the ID clinic to see how he is doing from his c.difficile infection  3. Leukocytosis = definitely having leukamoid reaction with elevated wbc  and plt count. Currently on steroids and azathioprine for crohn's disease. Would recommend repeat cbc this week as o/p to ensure trending down.  Carlyle Basques, [place] for [place] [phone] pager   [phone] cell 10/10/2014, 10:36 AM

## 2014-10-10 NOTE — Progress Notes (Signed)
Vincent Black to be D/C'd Home per MD order.  Discussed with the patient and all questions fully answered.    Medication List    ASK your doctor about these medications        Adalimumab 40 MG/0.8ML Pnkt  Inject 40 mg into the skin every 14 (fourteen) days.     ALPRAZolam 0.25 MG tablet  Commonly known as:  XANAX  Take 0.25 mg by mouth 3 (three) times daily as needed for anxiety.     azaTHIOprine 50 MG tablet  Commonly known as:  IMURAN  Take 50 mg by mouth daily.     metroNIDAZOLE 500 MG tablet  Commonly known as:  FLAGYL  Take 500 mg by mouth 2 (two) times daily.     oxyCODONE-acetaminophen 5-325 MG per tablet  Commonly known as:  PERCOCET/ROXICET  Take 1 tablet by mouth every 6 (six) hours as needed for moderate pain or severe pain.     predniSONE 20 MG tablet  Commonly known as:  DELTASONE  Take 20 mg by mouth daily with breakfast.     sertraline 100 MG tablet  Commonly known as:  ZOLOFT  Take 100 mg by mouth daily.     traMADol 50 MG tablet  Commonly known as:  ULTRAM  Take by mouth every 6 (six) hours as needed for moderate pain.        VVS, Skin clean, dry and intact without evidence of skin break down, no evidence of skin tears noted. IV catheter discontinued intact. Site without signs and symptoms of complications. Dressing and pressure applied.  An After Visit Summary was printed and given to the patient.  D/c education completed with patient/family including follow up instructions, medication list, d/c activities limitations if indicated, with other d/c instructions as indicated by MD - patient able to verbalize understanding, all questions fully answered.   Patient instructed to return to ED, call 911, or call MD for any changes in condition.   Patient escorted via [place], and D/C home via private auto.  Vincent Black D 10/10/2014 10:00 AM

## 2014-10-10 NOTE — Discharge Summary (Signed)
Physician Discharge Summary  Patient ID: Vincent Black MRN: [id number] DOB/AGE: [birth year] 44 y.o.  Admit date: 09/28/2014 Discharge date: 10/10/2014  Admission Diagnoses:  1) Severe C. Diff colitis.     2) Crohn's disease.     3) Anemia.     4) Hypoalbuminemia.  Discharge Diagnoses:  Same Principal Problem:   C. difficile colitis Active Problems:   Crohn's disease   Protein-calorie malnutrition, severe   Ankylosing spondylitis   Unintentional weight loss   Normocytic anemia   Leukocytosis   Thrombocytosis   Discharged Condition: good  Hospital Course: The patient was admitted in a very dehydrated and ill state.  He was having issues with hematochezia and frequent and voluminous diarrhea.  The patient was also hypokalemic.  He was started on vancomycin 125 mg QID and then this was increased to 500 mg QID with metronidazole 500 mg TID for severe C. diff colitis.  On hospital day #2 he underwent a FFS and there was severe inflammation in the rectum and sigmoid colon.  The biopsies were not able to discern if this inflammation was from infection or IBD.  There was no significant change for the first 3 days of hospitalization, but the by day four he noted an improvement.  Gradually he improved and his bowel movements declined as well as a resolution of his hematochezia.  The patient was also simultaneously treated with IV Solumedrol 40 mg q12 hours as it was difficult to discern if all of his symptoms were only from the C. Diff.  Using D5 1/2NS with 20 meq of KCL resolved his hypokalemia and dehydration.  By day four he started to have an appetite and his diet was advanced to a regular diet from a clear diet.  Nutrition did evaluate the patient and he was started on Resource TID to supplement his caloric intake.  His WBC upon admission was at 18,000 and then it normalized, but then the WBC started to increase as well as his Platelet count.  There was no fever and clinically he was improving.   In fact, his CRP levels were declining.  Solumedrol was changed over to 40 mg QD and he was also started back on AZA at 2.5 mg/kg thinking that his elevation in WBC was a reflection of an impending worsening of his Crohn's disease.  He peaked at 28,000 with his WBC without a decline in his clinical status. In fact, he was having 1-2 semi-formed bowel movements and he was afebrile.  ID was consulted and Dr. Megan Salon was not able to isolate a source.  The UA was negative and this was performed as he had some symptoms of dysuria.  His pain overall improved, but he did have issues with severe cramping abdominal pain with bowel movements.  Adding on MS Contin 30 mg Q12 hours was a benefit for him as he was able to reduce his need for dilaudid.  Prior to discharge a discussion of his clinical status was made with Dr. Megan Salon.  The consensus was that he was clinically well and not to focus on the WBC.  The recommendation was for him to maintain his Vancomycin 500 mg QID for a total of 14 days.  At the time of discharge he had one nearly formed bowel movement and he was feeling well.  He is also to maintain his prednisone at 40 mg and continue with his every other week Humira.  In fact, he did receive his scheduled dose in the hospital.  AZA  is also to be continued and he will follow up in the office in one week.  Consults: ID  Significant Diagnostic Studies: FFS  Treatments: IV hydration, antibiotics: vancomycin and metronidazole and analgesia: Dilaudid and Morphine  Discharge Exam: Blood pressure 129/93, pulse 100, temperature 98.7 F (37.1 C), temperature source Oral, resp. rate 18, height 5\' 9"  (1.753 m), weight 74.4 kg (164 lb 0.4 oz), SpO2 98 %. General appearance: alert and no distress Resp: clear to auscultation bilaterally Cardio: regular rate and rhythm, S1, S2 normal, no murmur, click, rub or gallop GI: persistent tenderness in the LLQ Extremities: extremities normal, atraumatic, no cyanosis or  edema  Disposition: 01-Home or Self Care     Medication List    STOP taking these medications        metroNIDAZOLE 500 MG tablet  Commonly known as:  FLAGYL     traMADol 50 MG tablet  Commonly known as:  ULTRAM      TAKE these medications        Adalimumab 40 MG/0.8ML Pnkt  Inject 40 mg into the skin every 14 (fourteen) days.     ALPRAZolam 0.25 MG tablet  Commonly known as:  XANAX  Take 0.25 mg by mouth 3 (three) times daily as needed for anxiety.     azaTHIOprine 50 MG tablet  Commonly known as:  IMURAN  Take 3.5 tablets (175 mg total) by mouth daily.     morphine 30 MG 12 hr tablet  Commonly known as:  MS CONTIN  Take 1 tablet (30 mg total) by mouth every 12 (twelve) hours.     oxyCODONE-acetaminophen 5-325 MG per tablet  Commonly known as:  PERCOCET/ROXICET  Take 1 tablet by mouth every 6 (six) hours as needed for moderate pain or severe pain.     predniSONE 20 MG tablet  Commonly known as:  DELTASONE  Take 2 tablets (40 mg total) by mouth daily with breakfast.     sertraline 100 MG tablet  Commonly known as:  ZOLOFT  Take 100 mg by mouth daily.     vancomycin 50 mg/mL oral solution  Commonly known as:  VANCOCIN  Take 10 mLs (500 mg total) by mouth every 6 (six) hours.         SignedCarol Ada D 10/10/2014, 1:57 PM

## 2014-10-25 ENCOUNTER — Other Ambulatory Visit: Payer: Self-pay | Admitting: Gastroenterology

## 2014-10-25 DIAGNOSIS — R1084 Generalized abdominal pain: Secondary | ICD-10-CM

## 2014-10-25 DIAGNOSIS — R509 Fever, unspecified: Secondary | ICD-10-CM

## 2014-10-26 ENCOUNTER — Encounter (HOSPITAL_COMMUNITY): Payer: Self-pay | Admitting: General Practice

## 2014-10-26 ENCOUNTER — Encounter (HOSPITAL_COMMUNITY): Payer: Self-pay

## 2014-10-26 ENCOUNTER — Inpatient Hospital Stay (HOSPITAL_COMMUNITY)
Admission: AD | Admit: 2014-10-26 | Discharge: 2014-11-08 | DRG: 853 | Disposition: A | Discharge status: Home Health | Payer: 59 | Source: Ambulatory Visit | Attending: Internal Medicine | Admitting: Internal Medicine

## 2014-10-26 ENCOUNTER — Ambulatory Visit (HOSPITAL_COMMUNITY)
Admission: RE | Admit: 2014-10-26 | Discharge: 2014-10-26 | Disposition: A | Discharge status: Home / Self Care | Payer: 59 | Source: Ambulatory Visit | Attending: Gastroenterology | Admitting: Gastroenterology

## 2014-10-26 DIAGNOSIS — Z6824 Body mass index (BMI) 24.0-24.9, adult: Secondary | ICD-10-CM

## 2014-10-26 DIAGNOSIS — E871 Hypo-osmolality and hyponatremia: Secondary | ICD-10-CM | POA: Insufficient documentation

## 2014-10-26 DIAGNOSIS — A419 Sepsis, unspecified organism: Secondary | ICD-10-CM | POA: Diagnosis present

## 2014-10-26 DIAGNOSIS — R1310 Dysphagia, unspecified: Secondary | ICD-10-CM

## 2014-10-26 DIAGNOSIS — Z79899 Other long term (current) drug therapy: Secondary | ICD-10-CM | POA: Diagnosis not present

## 2014-10-26 DIAGNOSIS — K5090 Crohn's disease, unspecified, without complications: Secondary | ICD-10-CM | POA: Diagnosis not present

## 2014-10-26 DIAGNOSIS — E43 Unspecified severe protein-calorie malnutrition: Secondary | ICD-10-CM | POA: Diagnosis present

## 2014-10-26 DIAGNOSIS — R509 Fever, unspecified: Secondary | ICD-10-CM

## 2014-10-26 DIAGNOSIS — A047 Enterocolitis due to Clostridium difficile: Secondary | ICD-10-CM | POA: Diagnosis present

## 2014-10-26 DIAGNOSIS — R64 Cachexia: Secondary | ICD-10-CM | POA: Diagnosis present

## 2014-10-26 DIAGNOSIS — K651 Peritoneal abscess: Secondary | ICD-10-CM

## 2014-10-26 DIAGNOSIS — Z79891 Long term (current) use of opiate analgesic: Secondary | ICD-10-CM | POA: Diagnosis not present

## 2014-10-26 DIAGNOSIS — R1032 Left lower quadrant pain: Secondary | ICD-10-CM | POA: Insufficient documentation

## 2014-10-26 DIAGNOSIS — K50919 Crohn's disease, unspecified, with unspecified complications: Secondary | ICD-10-CM

## 2014-10-26 DIAGNOSIS — Z87891 Personal history of nicotine dependence: Secondary | ICD-10-CM | POA: Diagnosis not present

## 2014-10-26 DIAGNOSIS — D473 Essential (hemorrhagic) thrombocythemia: Secondary | ICD-10-CM | POA: Diagnosis present

## 2014-10-26 DIAGNOSIS — Z7952 Long term (current) use of systemic steroids: Secondary | ICD-10-CM

## 2014-10-26 DIAGNOSIS — Z8619 Personal history of other infectious and parasitic diseases: Secondary | ICD-10-CM

## 2014-10-26 DIAGNOSIS — R300 Dysuria: Secondary | ICD-10-CM | POA: Diagnosis not present

## 2014-10-26 DIAGNOSIS — B9689 Other specified bacterial agents as the cause of diseases classified elsewhere: Secondary | ICD-10-CM | POA: Diagnosis present

## 2014-10-26 DIAGNOSIS — R109 Unspecified abdominal pain: Secondary | ICD-10-CM | POA: Diagnosis present

## 2014-10-26 DIAGNOSIS — D649 Anemia, unspecified: Secondary | ICD-10-CM | POA: Diagnosis present

## 2014-10-26 DIAGNOSIS — D638 Anemia in other chronic diseases classified elsewhere: Secondary | ICD-10-CM | POA: Diagnosis present

## 2014-10-26 DIAGNOSIS — R1084 Generalized abdominal pain: Secondary | ICD-10-CM

## 2014-10-26 DIAGNOSIS — K50913 Crohn's disease, unspecified, with fistula: Secondary | ICD-10-CM | POA: Diagnosis not present

## 2014-10-26 DIAGNOSIS — B961 Klebsiella pneumoniae [K. pneumoniae] as the cause of diseases classified elsewhere: Secondary | ICD-10-CM | POA: Diagnosis present

## 2014-10-26 DIAGNOSIS — F419 Anxiety disorder, unspecified: Secondary | ICD-10-CM | POA: Diagnosis present

## 2014-10-26 DIAGNOSIS — L0291 Cutaneous abscess, unspecified: Secondary | ICD-10-CM

## 2014-10-26 DIAGNOSIS — E860 Dehydration: Secondary | ICD-10-CM | POA: Diagnosis present

## 2014-10-26 DIAGNOSIS — E876 Hypokalemia: Secondary | ICD-10-CM | POA: Diagnosis not present

## 2014-10-26 DIAGNOSIS — M459 Ankylosing spondylitis of unspecified sites in spine: Secondary | ICD-10-CM | POA: Diagnosis present

## 2014-10-26 DIAGNOSIS — K630 Abscess of intestine: Secondary | ICD-10-CM | POA: Diagnosis present

## 2014-10-26 DIAGNOSIS — G894 Chronic pain syndrome: Secondary | ICD-10-CM | POA: Diagnosis present

## 2014-10-26 DIAGNOSIS — Z8719 Personal history of other diseases of the digestive system: Secondary | ICD-10-CM | POA: Diagnosis not present

## 2014-10-26 DIAGNOSIS — R634 Abnormal weight loss: Secondary | ICD-10-CM | POA: Diagnosis present

## 2014-10-26 DIAGNOSIS — B370 Candidal stomatitis: Secondary | ICD-10-CM | POA: Diagnosis present

## 2014-10-26 HISTORY — DX: Anxiety disorder, unspecified: F41.9

## 2014-10-26 LAB — COMPREHENSIVE METABOLIC PANEL
ALT: 9 U/L (ref 0–53)
ANION GAP: 10 (ref 5–15)
AST: 17 U/L (ref 0–37)
Albumin: 2 g/dL — ABNORMAL LOW (ref 3.5–5.2)
Alkaline Phosphatase: 81 U/L (ref 39–117)
BUN: 11 mg/dL (ref 6–23)
CO2: 30 mmol/L (ref 19–32)
Calcium: 7.9 mg/dL — ABNORMAL LOW (ref 8.4–10.5)
Chloride: 89 mmol/L — ABNORMAL LOW (ref 96–112)
Creatinine, Ser: 0.99 mg/dL (ref 0.50–1.35)
GFR calc Af Amer: >90 mL/min (ref >90)
GFR calc non Af Amer: >90 mL/min (ref >90)
GLUCOSE: 105 mg/dL — AB (ref 70–99)
Potassium: 3.7 mmol/L (ref 3.5–5.1)
SODIUM: 129 mmol/L — AB (ref 135–145)
Total Bilirubin: 0.8 mg/dL (ref 0.3–1.2)
Total Protein: 6.1 g/dL (ref 6.0–8.3)

## 2014-10-26 LAB — CBC
HCT: 27.9 % — ABNORMAL LOW (ref 39.0–52.0)
Hemoglobin: 9 g/dL — ABNORMAL LOW (ref 13.0–17.0)
MCH: 29.9 pg (ref 26.0–34.0)
MCHC: 32.3 g/dL (ref 30.0–36.0)
MCV: 92.7 fL (ref 78.0–100.0)
PLATELETS: 782 10*3/uL — AB (ref 150–400)
RBC: 3.01 MIL/uL — ABNORMAL LOW (ref 4.22–5.81)
RDW: 16.3 % — ABNORMAL HIGH (ref 11.5–15.5)
WBC: 14.6 10*3/uL — ABNORMAL HIGH (ref 4.0–10.5)

## 2014-10-26 MED ORDER — HYDROMORPHONE HCL 1 MG/ML IJ SOLN
2.0000 mg | INTRAMUSCULAR | Status: DC | PRN
Start: 2014-10-26 — End: 2014-10-26
  Administered 2014-10-26: 2 mg via INTRAVENOUS
  Filled 2014-10-26: qty 2

## 2014-10-26 MED ORDER — METRONIDAZOLE IN NACL 5-0.79 MG/ML-% IV SOLN
500.0000 mg | Freq: Three times a day (TID) | INTRAVENOUS | Status: DC
  Administered 2014-10-27 – 2014-10-28 (×4): 500 mg via INTRAVENOUS
  Filled 2014-10-26 (×7): qty 100

## 2014-10-26 MED ORDER — IOHEXOL 300 MG/ML  SOLN
100.0000 mL | Freq: Once | INTRAMUSCULAR | Status: AC | PRN
  Administered 2014-10-26: 100 mL via INTRAVENOUS

## 2014-10-26 MED ORDER — VANCOMYCIN 50 MG/ML ORAL SOLUTION
125.0000 mg | Freq: Four times a day (QID) | ORAL | Status: DC
  Administered 2014-10-26 – 2014-11-08 (×52): 125 mg via ORAL
  Filled 2014-10-26 (×56): qty 2.5

## 2014-10-26 MED ORDER — IBUPROFEN 800 MG PO TABS
800.0000 mg | ORAL_TABLET | Freq: Once | ORAL | Status: AC
  Administered 2014-10-26: 800 mg via ORAL
  Filled 2014-10-26: qty 1

## 2014-10-26 MED ORDER — HYDROMORPHONE HCL 1 MG/ML IJ SOLN
1.0000 mg | INTRAMUSCULAR | Status: DC | PRN
  Administered 2014-10-27 (×2): 1 mg via INTRAVENOUS
  Filled 2014-10-26 (×2): qty 1

## 2014-10-26 MED ORDER — MORPHINE SULFATE ER 30 MG PO TBCR
30.0000 mg | EXTENDED_RELEASE_TABLET | Freq: Two times a day (BID) | ORAL | Status: DC
  Administered 2014-10-26 – 2014-11-08 (×26): 30 mg via ORAL
  Filled 2014-10-26 (×26): qty 1

## 2014-10-26 MED ORDER — SODIUM CHLORIDE 0.9 % IV SOLN
INTRAVENOUS | Status: DC
  Administered 2014-10-26 – 2014-10-28 (×3): via INTRAVENOUS

## 2014-10-26 MED ORDER — ONDANSETRON HCL 4 MG/2ML IJ SOLN
4.0000 mg | Freq: Four times a day (QID) | INTRAMUSCULAR | Status: DC | PRN
  Administered 2014-10-26 – 2014-11-08 (×16): 4 mg via INTRAVENOUS
  Filled 2014-10-26 (×17): qty 2

## 2014-10-26 MED ORDER — SODIUM CHLORIDE 0.9 % IV BOLUS (SEPSIS)
500.0000 mL | Freq: Once | INTRAVENOUS | Status: AC
  Administered 2014-10-26: 500 mL via INTRAVENOUS

## 2014-10-26 MED ORDER — HEPARIN SODIUM (PORCINE) 5000 UNIT/ML IJ SOLN
5000.0000 [IU] | Freq: Three times a day (TID) | INTRAMUSCULAR | Status: DC
  Administered 2014-10-26 – 2014-10-27 (×2): 5000 [IU] via SUBCUTANEOUS
  Filled 2014-10-26 (×4): qty 1

## 2014-10-26 MED ORDER — ACETAMINOPHEN 325 MG PO TABS
650.0000 mg | ORAL_TABLET | Freq: Four times a day (QID) | ORAL | Status: DC | PRN
  Administered 2014-10-26 – 2014-11-04 (×4): 650 mg via ORAL
  Filled 2014-10-26 (×4): qty 2

## 2014-10-26 MED ORDER — FLUCONAZOLE 100MG IVPB
100.0000 mg | INTRAVENOUS | Status: DC
  Administered 2014-10-26 – 2014-11-02 (×8): 100 mg via INTRAVENOUS
  Filled 2014-10-26 (×9): qty 50

## 2014-10-26 MED ORDER — CIPROFLOXACIN IN D5W 400 MG/200ML IV SOLN
400.0000 mg | Freq: Two times a day (BID) | INTRAVENOUS | Status: DC
  Administered 2014-10-26 – 2014-10-28 (×4): 400 mg via INTRAVENOUS
  Filled 2014-10-26 (×5): qty 200

## 2014-10-26 NOTE — Progress Notes (Addendum)
ANTIBIOTIC CONSULT NOTE - INITIAL  Pharmacy Consult for Cipro Indication: intra-abdominal infection  No Known Allergies  Patient Measurements: Height: 5\' 7"  (170.2 cm) Weight: 149 lb 7.6 oz (67.8 kg) IBW/kg (Calculated) : 66.1  Vital Signs: Temp: 98.5 F (36.9 C) (03/17 1602) Temp Source: Oral (03/17 1602) BP: 125/105 mmHg (03/17 1602) Pulse Rate: 110 (03/17 1602) Intake/Output from previous day:   Intake/Output from this shift:    Labs:  Recent Labs  10/26/14 1825  WBC 14.6*  HGB 9.0*  PLT 782*  CREATININE 0.99   Estimated Creatinine Clearance: 90.9 mL/min (by C-G formula based on Cr of 0.99). No results for input(s): VANCOTROUGH, VANCOPEAK, VANCORANDOM, GENTTROUGH, GENTPEAK, GENTRANDOM, TOBRATROUGH, TOBRAPEAK, TOBRARND, AMIKACINPEAK, AMIKACINTROU, AMIKACIN in the last 72 hours.   Microbiology: Recent Results (from the past 720 hour(s))  Clostridium Difficile by PCR     Status: Abnormal   Collection Time: 09/28/14  5:52 PM  Result Value Ref Range Status   C difficile by pcr POSITIVE (A) NEGATIVE Final    Comment: CRITICAL RESULT CALLED TO, READ BACK BY AND VERIFIED WITH: Mordecai Rasmussen RN 10:10 09/29/14 ([email or web address])   Culture, Urine     Status: None   Collection Time: 10/08/14  4:33 PM  Result Value Ref Range Status   Specimen Description URINE, CLEAN CATCH  Final   Special Requests Immunocompromised  Final   Colony Count NO GROWTH Performed at [facility]   Final   Culture NO GROWTH Performed at [facility]   Final   Report Status 10/10/2014 FINAL  Final    Medical History: Past Medical History  Diagnosis Date  . Crohn disease   . C. difficile colitis 09/30/2014  . Ankylosing spondylitis 09/30/2014  . Anxiety     Medications:  See PTA medication list once updated  Assessment: 44 y/o male with Crohn's disease and on immunosuppressive therapy who was recently discharged and treated for C diff with vancomycin and Flagyl then to just  vancomycin.  He continues to have abdominal pain and fever. CT scan revealed sigmoid colon abscess. Pharmacy consulted to begin Cipro. Flagyl is also ordered. He is afebrile and WBC are elevated. Renal function is normal.   Goal of Therapy:  Eradication of infection  Plan:  - Cipro 400 mg IV q12h - Monitor renal function and clinical course  [facility], Pharm.D., BCPS Clinical Pharmacist Pager: [phone] 10/26/2014 8:26 PM

## 2014-10-26 NOTE — Consult Note (Addendum)
Reason for Consult: Crohn's disease, C. Diff, and sigmoid colon abscess Referring Physician: [facility]  Vincent Black HPI: This is a 44 year old male who is well-known to me for his history of Crohn's disease and C. Diff.  He was recently discharged from the hospital for treatment of his C. Diff in the setting of Crohn's disease.  He initially improved with vancomycin and metronidazole, however, despite clinical improvement he did have an increasing elevation in his WBC.  His LLQ pain also started to worsen, but it was not as severe as his admission pain.  ID was consulted and it was felt that he should continue with a total of 14 days of vancomycin, which he completed as an outpatient.  Upon follow up in the office a repeat CBC was obtained and his WBC was still noted to be markedly elevated.  His CRP had dropped, but it did not normalize.  He was tapering off on his steroids and his AZA was resumed.  Unfortunately, last week he called reporting that he had a fever of 101.  Vancomycin 125 mg QID was started, but he did not pick up the medication until this Monday.  He stated that his abdominal pain was not continuous.  There was a suspicion for an abscess and a CT scan was ordered.  The results revealed a 6x8 cm sigmoid colon abscess as well as inflammation in the colon.  As a result of his findings he was instructed to return to the hospital for readmission.  Past Medical History  Diagnosis Date  . Crohn disease   . C. difficile colitis 09/30/2014  . Ankylosing spondylitis 09/30/2014    Past Surgical History  Procedure Laterality Date  . No past surgeries    . Flexible sigmoidoscopy N/A 09/29/2014    Procedure: FLEXIBLE SIGMOIDOSCOPY;  Surgeon: Beryle Beams, MD;  Location: [facility];  Service: Endoscopy;  Laterality: N/A;    No family history on file.  Social History:  reports that he has quit smoking. He has never used smokeless tobacco. He reports that he drinks alcohol. He  reports that he does not use illicit drugs.  Allergies: No Known Allergies  Medications: Scheduled: Continuous:  No results found for this or any previous visit (from the past 24 hour(s)).   Ct Abdomen Pelvis W Contrast  10/26/2014   CLINICAL DATA:  Generalized abdominal pain. Nausea and vomiting. Fever. C difficile colitis.  EXAM: CT ABDOMEN AND PELVIS WITH CONTRAST  TECHNIQUE: Multidetector CT imaging of the abdomen and pelvis was performed using the standard protocol following bolus administration of intravenous contrast.  CONTRAST:  148mL OMNIPAQUE IOHEXOL 300 MG/ML  SOLN  COMPARISON:  07/11/2014  FINDINGS: Lower Chest:  Unremarkable.  Hepatobiliary: Mild hepatic steatosis. No liver masses are identified. Gallbladder is unremarkable. Tiny sub-cm cyst again seen in the inferior right hepatic lobe.  Pancreas: No mass, inflammatory changes, or other significant abnormality identified.  Spleen:  Within normal limits in size and appearance.  Adrenals:  No masses identified.  Kidneys/Urinary Tract:  No evidence of masses or hydronephrosis.  Stomach/Bowel/Peritoneum: Mild diffuse colonic wall thickening and mucosal enhancement is again demonstrated, consistent with diffuse colitis. However, there is a more cysts severe area of colonic wall thickening and pericolonic inflammatory change involving the proximal sigmoid colon with a large extraluminal collection containing gas stool and fluid which measures approximately 6.3 x 8.4 cm. This is consistent with a pericolonic abscess. There is no evidence of free fluid or bowel obstruction.  No evidence of pneumatosis or portal venous gas.  Vascular/Lymphatic: No pathologically enlarged lymph nodes identified. No other significant abnormality visualized.  Reproductive:  No mass or other significant abnormality identified.  Other:  None.  Musculoskeletal:  No suspicious bone lesions identified.  IMPRESSION: Severe colitis or diverticulitis involving the proximal  sigmoid colon, with large pericolonic abscess containing stool measuring approximately 6 x 8 cm. Background  Mild diffuse colitis also noted, without other areas of colonic perforation or abscess.  No evidence of free intraperitoneal air.  These results will be called to the ordering clinician or representative by the Radiologist Assistant, and communication documented in the PACS or zVision Dashboard.   Electronically Signed   By: Earle Gell M.D.   On: 10/26/2014 12:09    ROS:  As stated above in the HPI otherwise negative.  Blood pressure 125/105, pulse 110, temperature 98.5 F (36.9 C), temperature source Oral, resp. rate 18, height 5\' 7"  (1.702 m), weight 67.8 kg (149 lb 7.6 oz), SpO2 100 %.    PE: Gen: NAD, Alert and Oriented HEENT:  [place]/AT, EOMI Neck: Supple, no LAD Lungs: CTA Bilaterally CV: RRR without M/G/R ABM: Soft, tender in the LLQ, +BS Ext: No C/C/E  Assessment/Plan: 1) Sigmoid colon abscess. 2) Recent history of C. Diff. 3) Crohn's disease.   The abscess now explains the source of the persistently elevated WBC and his worsening LLQ abdominal pain.  No issues with diarrhea at this time.  Again, the main question will be management of his Crohn's disease and his abscess.  It may be necessary to hold his immunosuppressives to allow for healing of the abscess.  It is difficult to discern if the abscess is secondary to his C. Diff versus Crohn's disease.    Plan: 1) Await Surgical, ID, and IR assessment and recommendations. 2) Continue with Vancomycin 125 mg QID PO for now. 3) Emperic treatment for Candidal esophagitis with fluconazole for complaints of odynophagia.  Vincent Black D 10/26/2014, 4:24 PM

## 2014-10-26 NOTE — Progress Notes (Signed)
Pt direct admit. Has been here before, no immediate complaints or concers, pt stable, admission nurese called

## 2014-10-26 NOTE — H&P (Signed)
[facility] History and Physical  Patient: Vincent Black  MRN: [id number]  DOB: [birth year]  DOS: the patient was seen and examined on 10/26/2014 PCP: No PCP Per Patient  Chief Complaint: Abdominal pain  HPI: Vincent Black is a 44 y.o. male with Past medical history of Crohn's disease, C. difficile colitis. The patient presented with complaints of abdominal pain located on the left side ongoing since last few month and progressively worsening over last few days. He should was recently admitted in the hospital for bloody diarrhea and was found to have C. difficile colitis. He was sent home on oral vancomycin. He completed the course of antibiotic and restarted his Humira. After taking his second dose of Humira last week he started having complaints of increasingly worsening abdominal pain associated with nausea. He also had fever as well as fatigue. He complains of vomiting yesterday. He discussed this with his GI attending who started the patient on oral vancomycin and sent him for a CT scan. His CT of the abdomen and pelvis was showing sigmoid abscess and therefore he was sent here for further workup. Patient at time of my evaluation complains of pain in his left lower quadrant and also some pain on the right lower quadrant other than that he does not have any acute complaints. He mentions he is compliant with all medications.  The patient is coming from home. And at his baseline independent for most of his ADL.  Review of Systems: as mentioned in the history of present illness.  A Comprehensive review of the other systems is negative.  Past Medical History  Diagnosis Date  . Crohn disease   . C. difficile colitis 09/30/2014  . Ankylosing spondylitis 09/30/2014  . Anxiety    Past Surgical History  Procedure Laterality Date  . Flexible sigmoidoscopy N/A 09/29/2014    Procedure: FLEXIBLE SIGMOIDOSCOPY;  Surgeon: Beryle Beams, MD;  Location: [place];  Service:  Endoscopy;  Laterality: N/A;   Social History:  reports that he has quit smoking. His smoking use included Cigarettes. He has a 5 pack-year smoking history. He has never used smokeless tobacco. He reports that he drinks alcohol. He reports that he does not use illicit drugs.  No Known Allergies  History reviewed. No pertinent family history.  Prior to Admission medications   Medication Sig Start Date End Date Taking? Authorizing Provider  Adalimumab 40 MG/0.8ML PNKT Inject 40 mg into the skin every 14 (fourteen) days.   Yes Historical Provider, MD  azaTHIOprine (IMURAN) 50 MG tablet Take 3.5 tablets (175 mg total) by mouth daily. 10/10/14  Yes Carol Ada, MD  morphine (MS CONTIN) 30 MG 12 hr tablet Take 1 tablet (30 mg total) by mouth every 12 (twelve) hours. 10/10/14  Yes Carol Ada, MD  oxyCODONE-acetaminophen (PERCOCET/ROXICET) 5-325 MG per tablet Take 1 tablet by mouth every 6 (six) hours as needed for moderate pain or severe pain.   Yes Historical Provider, MD  predniSONE (DELTASONE) 20 MG tablet Take 2 tablets (40 mg total) by mouth daily with breakfast. Patient taking differently: Take 15 mg by mouth daily with breakfast.  10/10/14  Yes Carol Ada, MD  vancomycin (VANCOCIN) 50 mg/mL oral solution Take 10 mLs (500 mg total) by mouth every 6 (six) hours. 10/10/14  Yes Carol Ada, MD  ALPRAZolam Duanne Moron) 0.25 MG tablet Take 0.25 mg by mouth 3 (three) times daily as needed for anxiety.    Historical Provider, MD  sertraline (ZOLOFT) 100 MG tablet Take 100  mg by mouth daily.    Historical Provider, MD    Physical Exam: Filed Vitals:   10/26/14 1602 10/26/14 2054  BP: 125/105   Pulse: 110   Temp: 98.5 F (36.9 C) 102.2 F (39 C)  TempSrc: Oral Oral  Resp: 18   Height: _0  (1.702 m)   Weight: 67.8 kg (149 lb 7.6 oz)   SpO2: 100%     General: Alert, Awake and Oriented to Time, Place and Person. Appear in mild distress Eyes: PERRL ENT: Oral Mucosa with thrush moist. Neck: no  JVD Cardiovascular: S1 and S2 Present, no Murmur, Peripheral Pulses Present Respiratory: Bilateral Air entry equal and Decreased, Clear to Auscultation, noCrackles, no wheezes Abdomen: Bowel Sound present, Soft and left lower quadrant tender Skin: no Rash Extremities: no Pedal edema, no calf tenderness Neurologic: Grossly no focal neuro deficit.  Labs on Admission:  CBC:  Recent Labs Lab 10/26/14 1825  WBC 14.6*  HGB 9.0*  HCT 27.9*  MCV 92.7  PLT 782*    CMP     Component Value Date/Time   NA 129* 10/26/2014 1825   K 3.7 10/26/2014 1825   CL 89* 10/26/2014 1825   CO2 30 10/26/2014 1825   GLUCOSE 105* 10/26/2014 1825   BUN 11 10/26/2014 1825   CREATININE 0.99 10/26/2014 1825   CALCIUM 7.9* 10/26/2014 1825   PROT 6.1 10/26/2014 1825   ALBUMIN 2.0* 10/26/2014 1825   AST 17 10/26/2014 1825   ALT 9 10/26/2014 1825   ALKPHOS 81 10/26/2014 1825   BILITOT 0.8 10/26/2014 1825   GFRNONAA >90 10/26/2014 1825   GFRAA >90 10/26/2014 1825    No results for input(s): LIPASE, AMYLASE in the last 168 hours.  No results for input(s): CKTOTAL, CKMB, CKMBINDEX, TROPONINI in the last 168 hours. BNP (last 3 results) No results for input(s): BNP in the last 8760 hours.  ProBNP (last 3 results) No results for input(s): PROBNP in the last 8760 hours.   Radiological Exams on Admission: Ct Abdomen Pelvis W Contrast  10/26/2014   CLINICAL DATA:  Generalized abdominal pain. Nausea and vomiting. Fever. C difficile colitis.  EXAM: CT ABDOMEN AND PELVIS WITH CONTRAST  TECHNIQUE: Multidetector CT imaging of the abdomen and pelvis was performed using the standard protocol following bolus administration of intravenous contrast.  CONTRAST:  171m OMNIPAQUE IOHEXOL 300 MG/ML  SOLN  COMPARISON:  07/11/2014  FINDINGS: Lower Chest:  Unremarkable.  Hepatobiliary: Mild hepatic steatosis. No liver masses are identified. Gallbladder is unremarkable. Tiny sub-cm cyst again seen in the inferior right  hepatic lobe.  Pancreas: No mass, inflammatory changes, or other significant abnormality identified.  Spleen:  Within normal limits in size and appearance.  Adrenals:  No masses identified.  Kidneys/Urinary Tract:  No evidence of masses or hydronephrosis.  Stomach/Bowel/Peritoneum: Mild diffuse colonic wall thickening and mucosal enhancement is again demonstrated, consistent with diffuse colitis. However, there is a more cysts severe area of colonic wall thickening and pericolonic inflammatory change involving the proximal sigmoid colon with a large extraluminal collection containing gas stool and fluid which measures approximately 6.3 x 8.4 cm. This is consistent with a pericolonic abscess. There is no evidence of free fluid or bowel obstruction. No evidence of pneumatosis or portal venous gas.  Vascular/Lymphatic: No pathologically enlarged lymph nodes identified. No other significant abnormality visualized.  Reproductive:  No mass or other significant abnormality identified.  Other:  None.  Musculoskeletal:  No suspicious bone lesions identified.  IMPRESSION: Severe colitis or diverticulitis involving  the proximal sigmoid colon, with large pericolonic abscess containing stool measuring approximately 6 x 8 cm. Background  Mild diffuse colitis also noted, without other areas of colonic perforation or abscess.  No evidence of free intraperitoneal air.  These results will be called to the ordering clinician or representative by the Radiologist Assistant, and communication documented in the PACS or zVision Dashboard.   Electronically Signed   By: Earle Gell M.D.   On: 10/26/2014 12:09    Assessment/Plan Principal Problem:   Intra-abdominal abscess Active Problems:   Crohn's disease   C. difficile colitis   Normocytic anemia   Hyponatremia   Oral thrush   1. Intra-abdominal abscess  The patient is presenting with complaints of left lower quadrant abdominal pain, CT scan is positive for diverticulitis  associated with pericolonic abscess on the sigmoid colonic. Patient has been seen in by GI who recommends continue her vancomycin and hold his immunosuppressive medications. Currently I would also place him on Cipro and Flagyl and I will also place him on IV fluids. He will remain nothing by mouth except medications. Surgery has been consulted. Interventional radiology will be consulted in the morning. Will use when necessary pain medications for support. Check ESR and CRP in morning.  2. Hyponatremia. Likely secondary to dehydration. Recheck sodium  3. oral thrush. GI recommends Diflucan. We will continue the same.  4. normocytic anemia. Continue close monitoring.  5.. C. difficile colitis. Patient has recently been treated for C. difficile colitis and has been restarted on oral vancomycin. Patient currently has formed stool. We will be treated with Cipro and Flagyl. Would also continue with oral vancomycin currently.  Consults: Gastroenterology and general surgery  DVT Prophylaxis: subcutaneous Heparin Nutrition: Nothing by mouth  Family Communication: Family was present at bedside, opportunity was given to ask question and all questions were answered satisfactorily at the time of interview. Disposition: Admitted to inpatient in med-surge unit.  Author: Berle Mull, MD [facility] Pager: [phone] 10/26/2014, 10:01 PM    If 7PM-7AM, please contact night-coverage [email or web address] Password [email or web address]

## 2014-10-26 NOTE — Consult Note (Signed)
Reason for Consult: Intra-abdominal abscess Referring Physician: Orion Black is an 44 y.o. male.  HPI: Vincent Black has a long history of both Crohn's disease and ankylosing spondylitis. He has been on Humira for approximately 5 years. His Crohn's disease was asymptomatic during that time, and he was living in [place]. His rheumatologist was prescribing the Humira. Towards the end of last year, he relocated to [place] where he works for [employer]. He established with Dr. Benson Black from gastroenterology who has been treating him for his Crohn's disease. He developed Clostridium difficile colitis early this year and has struggled with this for several weeks. He was recently discharged earlier this month after treatment by Dr. Benson Black. He developed fevers at home with associated left lower quadrant abdominal pain. He underwent CT scan and was admitted. CT reveals a large abscess along the sigmoid colon which appears to contain stool. He complains of some pain in his left lower quadrant. He has been febrile here in the hospital. We are asked to consult to assist in management of this abscess.  Past Medical History  Diagnosis Date  . Crohn disease   . C. difficile colitis 09/30/2014  . Ankylosing spondylitis 09/30/2014  . Anxiety     Past Surgical History  Procedure Laterality Date  . Flexible sigmoidoscopy N/A 09/29/2014    Procedure: FLEXIBLE SIGMOIDOSCOPY;  Surgeon: Vincent Beams, MD;  Location: [place];  Service: Endoscopy;  Laterality: N/A;    History reviewed. No pertinent family history.  Social History:  reports that he has quit smoking. His smoking use included Cigarettes. He has a 5 pack-year smoking history. He has never used smokeless tobacco. He reports that he drinks alcohol. He reports that he does not use illicit drugs.  Allergies: No Known Allergies  Medications:  Prior to Admission:  Prescriptions prior to admission  Medication Sig Dispense Refill Last Dose  .  Adalimumab 40 MG/0.8ML PNKT Inject 40 mg into the skin every 14 (fourteen) days.   09/22/2014  . azaTHIOprine (IMURAN) 50 MG tablet Take 3.5 tablets (175 mg total) by mouth daily. 105 tablet 5   . morphine (MS CONTIN) 30 MG 12 hr tablet Take 1 tablet (30 mg total) by mouth every 12 (twelve) hours. 60 tablet 0   . oxyCODONE-acetaminophen (PERCOCET/ROXICET) 5-325 MG per tablet Take 1 tablet by mouth every 6 (six) hours as needed for moderate pain or severe pain.   09/27/2014 at Unknown time  . predniSONE (DELTASONE) 20 MG tablet Take 2 tablets (40 mg total) by mouth daily with breakfast. (Patient taking differently: Take 15 mg by mouth daily with breakfast. ) 60 tablet 3   . vancomycin (VANCOCIN) 50 mg/mL oral solution Take 10 mLs (500 mg total) by mouth every 6 (six) hours. 120 mL 0   . ALPRAZolam (XANAX) 0.25 MG tablet Take 0.25 mg by mouth 3 (three) times daily as needed for anxiety.   Past Week at Unknown time  . sertraline (ZOLOFT) 100 MG tablet Take 100 mg by mouth daily.   09/26/2014    Results for orders placed or performed during the hospital encounter of 10/26/14 (from the past 48 hour(s))  CBC     Status: Abnormal   Collection Time: 10/26/14  6:25 PM  Result Value Ref Range   WBC 14.6 (H) 4.0 - 10.5 K/uL   RBC 3.01 (L) 4.22 - 5.81 MIL/uL   Hemoglobin 9.0 (L) 13.0 - 17.0 g/dL   HCT 27.9 (L) 39.0 - 52.0 %  MCV 92.7 78.0 - 100.0 fL   MCH 29.9 26.0 - 34.0 pg   MCHC 32.3 30.0 - 36.0 g/dL   RDW 16.3 (H) 11.5 - 15.5 %   Platelets 782 (H) 150 - 400 K/uL  Comprehensive metabolic panel     Status: Abnormal   Collection Time: 10/26/14  6:25 PM  Result Value Ref Range   Sodium 129 (L) 135 - 145 mmol/L   Potassium 3.7 3.5 - 5.1 mmol/L   Chloride 89 (L) 96 - 112 mmol/L   CO2 30 19 - 32 mmol/L   Glucose, Bld 105 (H) 70 - 99 mg/dL   BUN 11 6 - 23 mg/dL   Creatinine, Ser 0.99 0.50 - 1.35 mg/dL   Calcium 7.9 (L) 8.4 - 10.5 mg/dL   Total Protein 6.1 6.0 - 8.3 g/dL   Albumin 2.0 (L) 3.5 - 5.2  g/dL   AST 17 0 - 37 U/L   ALT 9 0 - 53 U/L   Alkaline Phosphatase 81 39 - 117 U/L   Total Bilirubin 0.8 0.3 - 1.2 mg/dL   GFR calc non Af Amer >90 >90 mL/min   GFR calc Af Amer >90 >90 mL/min    Comment: (NOTE) The eGFR has been calculated using the CKD EPI equation. This calculation has not been validated in all clinical situations. eGFR's persistently <90 mL/min signify possible Chronic Kidney Disease.    Anion gap 10 5 - 15    Ct Abdomen Pelvis W Contrast  10/26/2014   CLINICAL DATA:  Generalized abdominal pain. Nausea and vomiting. Fever. C difficile colitis.  EXAM: CT ABDOMEN AND PELVIS WITH CONTRAST  TECHNIQUE: Multidetector CT imaging of the abdomen and pelvis was performed using the standard protocol following bolus administration of intravenous contrast.  CONTRAST:  158m OMNIPAQUE IOHEXOL 300 MG/ML  SOLN  COMPARISON:  07/11/2014  FINDINGS: Lower Chest:  Unremarkable.  Hepatobiliary: Mild hepatic steatosis. No liver masses are identified. Gallbladder is unremarkable. Tiny sub-cm cyst again seen in the inferior right hepatic lobe.  Pancreas: No mass, inflammatory changes, or other significant abnormality identified.  Spleen:  Within normal limits in size and appearance.  Adrenals:  No masses identified.  Kidneys/Urinary Tract:  No evidence of masses or hydronephrosis.  Stomach/Bowel/Peritoneum: Mild diffuse colonic wall thickening and mucosal enhancement is again demonstrated, consistent with diffuse colitis. However, there is a more cysts severe area of colonic wall thickening and pericolonic inflammatory change involving the proximal sigmoid colon with a large extraluminal collection containing gas stool and fluid which measures approximately 6.3 x 8.4 cm. This is consistent with a pericolonic abscess. There is no evidence of free fluid or bowel obstruction. No evidence of pneumatosis or portal venous gas.  Vascular/Lymphatic: No pathologically enlarged lymph nodes identified. No other  significant abnormality visualized.  Reproductive:  No mass or other significant abnormality identified.  Other:  None.  Musculoskeletal:  No suspicious bone lesions identified.  IMPRESSION: Severe colitis or diverticulitis involving the proximal sigmoid colon, with large pericolonic abscess containing stool measuring approximately 6 x 8 cm. Background  Mild diffuse colitis also noted, without other areas of colonic perforation or abscess.  No evidence of free intraperitoneal air.  These results will be called to the ordering clinician or representative by the Radiologist Assistant, and communication documented in the PACS or zVision Dashboard.   Electronically Signed   By: JEarle GellM.D.   On: 10/26/2014 12:09    Review of Systems  Constitutional: Positive for fever.  HENT: Negative.  Eyes: Negative.   Respiratory: Negative.   Cardiovascular: Negative.   Gastrointestinal: Positive for abdominal pain.  Genitourinary: Negative.   Musculoskeletal: Positive for joint pain.  Skin: Negative.   Neurological: Negative.   Endo/Heme/Allergies: Negative.   Psychiatric/Behavioral: Negative.    Blood pressure 125/105, pulse 110, temperature 102.2 F (39 C), temperature source Oral, resp. rate 18, height 5' 7"  (1.702 m), weight 67.8 kg (149 lb 7.6 oz), SpO2 100 %. Physical Exam  Constitutional: He is oriented to person, place, and time. He appears well-developed and well-nourished. No distress.  HENT:  Head: Normocephalic and atraumatic.  Right Ear: External ear normal.  Left Ear: External ear normal.  Nose: Nose normal.  Mouth/Throat: Oropharynx is clear and moist. No oropharyngeal exudate.  Eyes: EOM are normal. Pupils are equal, round, and reactive to light. Right eye exhibits no discharge. Left eye exhibits no discharge. No scleral icterus.  Neck: Neck supple. No tracheal deviation present.  Cardiovascular: Normal rate, regular rhythm, normal heart sounds and intact distal pulses.    Respiratory: Effort normal and breath sounds normal. No stridor. No respiratory distress. He has no wheezes. He has no rales.  GI: Soft. He exhibits no distension. There is tenderness. There is no rebound and no guarding.  Tender fullness in the left lower quadrant without guarding, no generalized peritonitis, hypoactive bowel sounds  Musculoskeletal: He exhibits no edema.  Lymphadenopathy:    He has no cervical adenopathy.  Neurological: He is alert and oriented to person, place, and time. He exhibits normal muscle tone.  Skin: Skin is warm and dry.  Psychiatric: He has a normal mood and affect.    Assessment/Plan: Crohn's disease and Clostridium difficile colitis now with sigmoid perforation and abscess. Agree with IV antibiotics and interventional radiology for percutaneous drain. If he does not respond to these treatments, he may need segmental colectomy with colostomy. This was discussed in detail with him and I answered his questions. We will follow closely with you.  Ardice Boyan E 10/26/2014, 9:50 PM

## 2014-10-26 NOTE — Progress Notes (Addendum)
Hx of crohn's , now has c diff, already on vanc , ID felt he could go home Last Friday had a fever , Dr Benson Norway restarted his vanc  CT scan showed abcess in the sigmoid colon area  Need admitted for intrabdominal abcess , needs IR/sugery consult 6/8 cm, no fistula , Direct admission from home  On azathiprine  And humira for immunosupression

## 2014-10-27 ENCOUNTER — Encounter (HOSPITAL_COMMUNITY): Payer: Self-pay | Admitting: Radiology

## 2014-10-27 ENCOUNTER — Inpatient Hospital Stay (HOSPITAL_COMMUNITY): Payer: 59

## 2014-10-27 DIAGNOSIS — K5090 Crohn's disease, unspecified, without complications: Secondary | ICD-10-CM

## 2014-10-27 DIAGNOSIS — K630 Abscess of intestine: Secondary | ICD-10-CM | POA: Diagnosis present

## 2014-10-27 DIAGNOSIS — R1310 Dysphagia, unspecified: Secondary | ICD-10-CM

## 2014-10-27 DIAGNOSIS — A047 Enterocolitis due to Clostridium difficile: Secondary | ICD-10-CM

## 2014-10-27 HISTORY — DX: Abscess of intestine: K63.0

## 2014-10-27 LAB — COMPREHENSIVE METABOLIC PANEL
ALBUMIN: 1.7 g/dL — AB (ref 3.5–5.2)
ALT: 7 U/L (ref 0–53)
AST: 10 U/L (ref 0–37)
Alkaline Phosphatase: 68 U/L (ref 39–117)
Anion gap: 7 (ref 5–15)
BUN: 9 mg/dL (ref 6–23)
CALCIUM: 8.1 mg/dL — AB (ref 8.4–10.5)
CO2: 29 mmol/L (ref 19–32)
Chloride: 98 mmol/L (ref 96–112)
Creatinine, Ser: 0.89 mg/dL (ref 0.50–1.35)
GFR calc Af Amer: >90 mL/min (ref >90)
GLUCOSE: 83 mg/dL (ref 70–99)
POTASSIUM: 4 mmol/L (ref 3.5–5.1)
Sodium: 134 mmol/L — ABNORMAL LOW (ref 135–145)
TOTAL PROTEIN: 5.3 g/dL — AB (ref 6.0–8.3)
Total Bilirubin: 0.3 mg/dL (ref 0.3–1.2)

## 2014-10-27 LAB — PROTIME-INR
INR: 1.36 (ref 0.00–1.49)
PROTHROMBIN TIME: 16.9 s — AB (ref 11.6–15.2)

## 2014-10-27 LAB — LACTIC ACID, PLASMA
LACTIC ACID, VENOUS: 0.9 mmol/L (ref 0.5–2.0)
LACTIC ACID, VENOUS: 2.9 mmol/L — AB (ref 0.5–2.0)

## 2014-10-27 LAB — CBC
HCT: 25 % — ABNORMAL LOW (ref 39.0–52.0)
Hemoglobin: 8 g/dL — ABNORMAL LOW (ref 13.0–17.0)
MCH: 29.7 pg (ref 26.0–34.0)
MCHC: 32 g/dL (ref 30.0–36.0)
MCV: 92.9 fL (ref 78.0–100.0)
Platelets: 626 10*3/uL — ABNORMAL HIGH (ref 150–400)
RBC: 2.69 MIL/uL — ABNORMAL LOW (ref 4.22–5.81)
RDW: 16.3 % — ABNORMAL HIGH (ref 11.5–15.5)
WBC: 11.9 10*3/uL — ABNORMAL HIGH (ref 4.0–10.5)

## 2014-10-27 LAB — APTT: APTT: 34 s (ref 24–37)

## 2014-10-27 LAB — URINALYSIS, ROUTINE W REFLEX MICROSCOPIC
Bilirubin Urine: NEGATIVE
Glucose, UA: NEGATIVE mg/dL
Hgb urine dipstick: NEGATIVE
Ketones, ur: NEGATIVE mg/dL
Leukocytes, UA: NEGATIVE
Nitrite: NEGATIVE
Protein, ur: NEGATIVE mg/dL
Specific Gravity, Urine: 1.006 (ref 1.005–1.030)
Urobilinogen, UA: 0.2 mg/dL (ref 0.0–1.0)
pH: 6 (ref 5.0–8.0)

## 2014-10-27 LAB — PROCALCITONIN: Procalcitonin: 0.27 ng/mL

## 2014-10-27 LAB — C-REACTIVE PROTEIN: CRP: 24.4 mg/dL — ABNORMAL HIGH (ref <0.60)

## 2014-10-27 LAB — SEDIMENTATION RATE: Sed Rate: 112 mm/hr — ABNORMAL HIGH (ref 0–16)

## 2014-10-27 MED ORDER — ONDANSETRON HCL 4 MG/2ML IJ SOLN
4.0000 mg | Freq: Four times a day (QID) | INTRAMUSCULAR | Status: DC | PRN
  Administered 2014-10-31: 4 mg via INTRAVENOUS
  Filled 2014-10-27: qty 2

## 2014-10-27 MED ORDER — MIDAZOLAM HCL 2 MG/2ML IJ SOLN
INTRAMUSCULAR | Status: AC | PRN
  Administered 2014-10-27 (×2): 1 mg via INTRAVENOUS

## 2014-10-27 MED ORDER — HYDROMORPHONE HCL 1 MG/ML IJ SOLN
0.5000 mg | INTRAMUSCULAR | Status: DC | PRN
  Administered 2014-10-27: 2 mg via INTRAVENOUS
  Filled 2014-10-27: qty 2

## 2014-10-27 MED ORDER — MIDAZOLAM HCL 2 MG/2ML IJ SOLN
INTRAMUSCULAR | Status: AC
  Filled 2014-10-27: qty 4

## 2014-10-27 MED ORDER — HYDROMORPHONE HCL 1 MG/ML IJ SOLN
1.0000 mg | INTRAMUSCULAR | Status: DC | PRN
  Administered 2014-10-27: 1 mg via INTRAVENOUS
  Filled 2014-10-27: qty 1

## 2014-10-27 MED ORDER — HYDROMORPHONE HCL 1 MG/ML IJ SOLN
INTRAMUSCULAR | Status: AC
  Filled 2014-10-27: qty 2

## 2014-10-27 MED ORDER — DIPHENHYDRAMINE HCL 50 MG/ML IJ SOLN
12.5000 mg | Freq: Four times a day (QID) | INTRAMUSCULAR | Status: DC | PRN
  Administered 2014-10-30: 12.5 mg via INTRAVENOUS
  Filled 2014-10-27: qty 1

## 2014-10-27 MED ORDER — FENTANYL CITRATE 0.05 MG/ML IJ SOLN
INTRAMUSCULAR | Status: AC
  Filled 2014-10-27: qty 4

## 2014-10-27 MED ORDER — NALOXONE HCL 0.4 MG/ML IJ SOLN: 0.4000 mg | INTRAMUSCULAR | Status: DC | PRN

## 2014-10-27 MED ORDER — HEPARIN SODIUM (PORCINE) 5000 UNIT/ML IJ SOLN
5000.0000 [IU] | Freq: Three times a day (TID) | INTRAMUSCULAR | Status: DC
  Administered 2014-10-28 – 2014-11-08 (×34): 5000 [IU] via SUBCUTANEOUS
  Filled 2014-10-27 (×39): qty 1

## 2014-10-27 MED ORDER — HYDROMORPHONE 0.3 MG/ML IV SOLN
INTRAVENOUS | Status: DC
  Administered 2014-10-27: 15:00:00 via INTRAVENOUS
  Administered 2014-10-27: 2.6 mg via INTRAVENOUS
  Administered 2014-10-28 (×3): via INTRAVENOUS
  Administered 2014-10-28: 2.7 mg via INTRAVENOUS
  Administered 2014-10-28: 1.2 mg via INTRAVENOUS
  Administered 2014-10-28: 2.1 mg via INTRAVENOUS
  Administered 2014-10-28: 3.6 mg via INTRAVENOUS
  Administered 2014-10-28: 2.4 mg via INTRAVENOUS
  Administered 2014-10-29: 13:00:00 via INTRAVENOUS
  Administered 2014-10-29: 4.5 mg via INTRAVENOUS
  Administered 2014-10-29: 04:00:00 via INTRAVENOUS
  Administered 2014-10-29: 3 mg via INTRAVENOUS
  Administered 2014-10-29: 5.4 mg via INTRAVENOUS
  Administered 2014-10-29: 23:00:00 via INTRAVENOUS
  Administered 2014-10-30: 1.5 mg via INTRAVENOUS
  Administered 2014-10-30: 9 mg via INTRAVENOUS
  Administered 2014-10-30: 3.3 mg via INTRAVENOUS
  Administered 2014-10-30: 2.1 mg via INTRAVENOUS
  Administered 2014-10-30: 09:00:00 via INTRAVENOUS
  Administered 2014-10-30: 0.9 mg via INTRAVENOUS
  Administered 2014-10-31: 5 mg via INTRAVENOUS
  Administered 2014-10-31 (×2): 0.3 mg via INTRAVENOUS
  Administered 2014-10-31: 1.8 mg via INTRAVENOUS
  Administered 2014-10-31: 2.1 mg via INTRAVENOUS
  Administered 2014-10-31: 0.3 mg via INTRAVENOUS
  Administered 2014-10-31: 03:00:00 via INTRAVENOUS
  Administered 2014-11-01: 0 mg via INTRAVENOUS
  Administered 2014-11-01: 0.3 mg via INTRAVENOUS
  Administered 2014-11-01: 0 mg via INTRAVENOUS
  Administered 2014-11-01: 0.6 mg via INTRAVENOUS
  Administered 2014-11-02: 0.9 mg via INTRAVENOUS
  Administered 2014-11-02: 07:00:00 via INTRAVENOUS
  Administered 2014-11-03: 0.9 mg via INTRAVENOUS
  Administered 2014-11-03 (×2): 0.3 mg via INTRAVENOUS
  Administered 2014-11-04 (×2): 1.2 mg via INTRAVENOUS
  Filled 2014-10-27 (×11): qty 25

## 2014-10-27 MED ORDER — SODIUM CHLORIDE 0.9 % IJ SOLN: 9.0000 mL | INTRAMUSCULAR | Status: DC | PRN

## 2014-10-27 MED ORDER — HYDROMORPHONE HCL 1 MG/ML IJ SOLN
INTRAMUSCULAR | Status: AC | PRN
  Administered 2014-10-27: 1 mg via INTRAVENOUS

## 2014-10-27 MED ORDER — DIPHENHYDRAMINE HCL 12.5 MG/5ML PO ELIX
12.5000 mg | ORAL_SOLUTION | Freq: Four times a day (QID) | ORAL | Status: DC | PRN
  Filled 2014-10-27: qty 5

## 2014-10-27 NOTE — Sedation Documentation (Signed)
Drain secured in place with a stat lock.

## 2014-10-27 NOTE — Sedation Documentation (Signed)
Discussed PCA dilaudid pump w/ Dr. Laurence Ferrari and dosing.

## 2014-10-27 NOTE — Procedures (Signed)
Interventional Radiology Procedure Note  Procedure: Placement of 62F drain into LLQ peri-colonic rupture.  Aspiration yields 50 mL stool.  Sample sent for cx.    Complications: None  Estimated Blood Loss:None  Recommendations: - Extremely high likelihood of fistula to colon given large rupture site. - Tube to JP until acute symptoms improved, then change to gravity bag to minimize fistula  - Drain injection prior to removal - 2 weeks drainage at least  Signed,  Criselda Peaches, MD

## 2014-10-27 NOTE — Sedation Documentation (Signed)
MD suturing line in place. Pt tolerating well.

## 2014-10-27 NOTE — Consult Note (Signed)
[facility]    Date of Admission:  10/26/2014   Day 2 ciprofloxacin        Day 2 metronidazole        Day 2 fluconazole        Day 2 oral vancomycin       Reason for Consult: Acute pericolonic abscess in the setting of Crohn's disease and recent C. Difficile colitis    Referring Physician: Dr. Carol Ada  Principal Problem:   Pericolonic abscess Active Problems:   History of Clostridium difficile colitis   Odynophagia   Crohn's disease   Protein-calorie malnutrition, severe   Ankylosing spondylitis   Unintentional weight loss   Normocytic anemia   . ciprofloxacin  400 mg Intravenous Q12H  . fluconazole (DIFLUCAN) IV  100 mg Intravenous Q24H  . [START ON 10/28/2014] heparin  5,000 Units Subcutaneous 3 times per day  . HYDROmorphone PCA 0.3 mg/mL   Intravenous 6 times per day  . metronidazole  500 mg Intravenous Q8H  . morphine  30 mg Oral Q12H  . vancomycin  125 mg Oral 4 times per day    Recommendations: 1. Agree with current antimicrobial therapy 2. Repeat C. difficile PCR 3. Await results of Gram stain and culture on abscess aspirate   Assessment: I agree with empiric antibiotics for his left lower congruent abscess and percutaneous drainage with specimen submitted for Gram stain and culture. He is at very high risk for early relapse of his C. Difficile colitis and I agree with placing him back on oral vancomycin even if the repeat PCR is negative. He has chronic odynophagia of uncertain cause. I agree with a trial of fluconazole.   HPI: Vincent Black is a 44 y.o. male with a history of Crohn's disease and ankylosing spondylitis. He has been on Humira for over 5 years and had good control of both his Crohn's and ankylosing spondylitis. He began to have worsening diarrhea late last November. This was initially attributed to his Crohn's disease. He was started on azathioprine and prednisone. He got a little bit better but then his  diarrhea became much worse in January. He was diagnosed with his first episode of C. difficile colitis and started on oral metronidazole but did not have any improvement. He developed severe anorexia and significant unintentional weight loss. He was hospitalized last month and started on oral vancomycin with slow improvement. He completed 2 weeks of therapy earlier this month and felt much better for several days. However he then began to have onset of left lower cause moderate pain which has become progressively more severe associated with fever. He noted worsening after his last dose of Humira one week ago. A CT scan was obtained which showed a 6.3 x 8.4 cm abscess adjacent to the sigmoid colon. He started having diarrhea again last night.   Review of Systems: Constitutional: positive for anorexia, chills, fevers, malaise and weight loss, negative for sweats Eyes: negative Ears, nose, mouth, throat, and face: positive for odynophagia for several months, negative for sore mouth and sore throat Respiratory: negative Cardiovascular: negative Gastrointestinal: positive for abdominal pain, diarrhea and odynophagia, negative for constipation, dysphagia, nausea, reflux symptoms and vomiting Genitourinary:negative  Past Medical History  Diagnosis Date  . Crohn disease   . C. difficile colitis 09/30/2014  . Ankylosing spondylitis 09/30/2014  . Anxiety     History  Substance Use Topics  . Smoking status: Former Smoker --  0.50 packs/day for 10 years    Types: Cigarettes  . Smokeless tobacco: Never Used     Comment: 'quit smoking in ~ 2014"  . Alcohol Use: Yes     Comment: 10/25/2014 "might have a drink a couple times/yr"    History reviewed. No pertinent family history. No Known Allergies  OBJECTIVE: Blood pressure 122/69, pulse 101, temperature 99.2 F (37.3 C), temperature source Oral, resp. rate 18, height 5\' 7"  (1.702 m), weight 149 lb 7.6 oz (67.8 kg), SpO2 96 %. General: he is  uncomfortable due to pain Skin: no rash Oral: No thrush or other oropharyngeal lesions noted Lungs: clear Cor: regular S1 and S2 with no murmurs Abdomen: soft but tender in the left lower quadrant. Quiet bowel sounds   Lab Results Lab Results  Component Value Date   WBC 11.9* 10/27/2014   HGB 8.0* 10/27/2014   HCT 25.0* 10/27/2014   MCV 92.9 10/27/2014   PLT 626* 10/27/2014    Lab Results  Component Value Date   CREATININE 0.89 10/27/2014   BUN 9 10/27/2014   NA 134* 10/27/2014   K 4.0 10/27/2014   CL 98 10/27/2014   CO2 29 10/27/2014    Lab Results  Component Value Date   ALT 7 10/27/2014   AST 10 10/27/2014   ALKPHOS 68 10/27/2014   BILITOT 0.3 10/27/2014     Microbiology: No results found for this or any previous visit (from the past 240 hour(s)).  Michel Bickers, MD [facility] [place] [facility] [phone] pager   [phone] cell 10/27/2014, 4:23 PM

## 2014-10-27 NOTE — Sedation Documentation (Signed)
Pt awake, no c/o pain. Lying still, Dr. Laurence Ferrari pulling off drainage via 14Fr catheter, 50cc brown drainage. Suturing and placing to JP bulb.

## 2014-10-27 NOTE — Progress Notes (Signed)
Patient ID: Vincent Black, male   DOB: 1971/07/17, 44 y.o.   MRN: [id number]    Subjective: Pt feels worse since admission as he is not getting his pain medications often enough to control his pain.  Objective: Vital signs in last 24 hours: Temp:  [97.8 F (36.6 C)-102.2 F (39 C)] 97.8 F (36.6 C) (03/18 0604) Pulse Rate:  [80-110] 80 (03/18 0604) Resp:  [12-18] 12 (03/18 0604) BP: (110-125)/(73-105) 110/73 mmHg (03/18 0604) SpO2:  [99 %-100 %] 99 % (03/18 0604) Weight:  [67.8 kg (149 lb 7.6 oz)] 67.8 kg (149 lb 7.6 oz) (03/18 0500) Last BM Date: 10/26/14  Intake/Output from previous day: 03/17 0701 - 03/18 0700 In: 1440 [I.V.:990; IV Piggyback:450] Out: 800 [Urine:800] Intake/Output this shift:    PE: Abd: soft, tender greatest in LLQ, but some tenderness in RLQ and LUQ, hypoactive BS. ND Heart: regular with frequent ectopic beats Lungs: CTAB  Lab Results:   Recent Labs  10/26/14 1825 10/27/14 0630  WBC 14.6* 11.9*  HGB 9.0* 8.0*  HCT 27.9* 25.0*  PLT 782* 626*   BMET  Recent Labs  10/26/14 1825 10/27/14 0630  NA 129* 134*  K 3.7 4.0  CL 89* 98  CO2 30 29  GLUCOSE 105* 83  BUN 11 9  CREATININE 0.99 0.89  CALCIUM 7.9* 8.1*   PT/INR  Recent Labs  10/27/14 0630  LABPROT 16.9*  INR 1.36   CMP     Component Value Date/Time   NA 134* 10/27/2014 0630   K 4.0 10/27/2014 0630   CL 98 10/27/2014 0630   CO2 29 10/27/2014 0630   GLUCOSE 83 10/27/2014 0630   BUN 9 10/27/2014 0630   CREATININE 0.89 10/27/2014 0630   CALCIUM 8.1* 10/27/2014 0630   PROT 5.3* 10/27/2014 0630   ALBUMIN 1.7* 10/27/2014 0630   AST 10 10/27/2014 0630   ALT 7 10/27/2014 0630   ALKPHOS 68 10/27/2014 0630   BILITOT 0.3 10/27/2014 0630   GFRNONAA >90 10/27/2014 0630   GFRAA >90 10/27/2014 0630   Lipase  No results found for: LIPASE     Studies/Results: Ct Abdomen Pelvis W Contrast  10/26/2014   CLINICAL DATA:  Generalized abdominal pain. Nausea and vomiting. Fever.  C difficile colitis.  EXAM: CT ABDOMEN AND PELVIS WITH CONTRAST  TECHNIQUE: Multidetector CT imaging of the abdomen and pelvis was performed using the standard protocol following bolus administration of intravenous contrast.  CONTRAST:  152mL OMNIPAQUE IOHEXOL 300 MG/ML  SOLN  COMPARISON:  07/11/2014  FINDINGS: Lower Chest:  Unremarkable.  Hepatobiliary: Mild hepatic steatosis. No liver masses are identified. Gallbladder is unremarkable. Tiny sub-cm cyst again seen in the inferior right hepatic lobe.  Pancreas: No mass, inflammatory changes, or other significant abnormality identified.  Spleen:  Within normal limits in size and appearance.  Adrenals:  No masses identified.  Kidneys/Urinary Tract:  No evidence of masses or hydronephrosis.  Stomach/Bowel/Peritoneum: Mild diffuse colonic wall thickening and mucosal enhancement is again demonstrated, consistent with diffuse colitis. However, there is a more cysts severe area of colonic wall thickening and pericolonic inflammatory change involving the proximal sigmoid colon with a large extraluminal collection containing gas stool and fluid which measures approximately 6.3 x 8.4 cm. This is consistent with a pericolonic abscess. There is no evidence of free fluid or bowel obstruction. No evidence of pneumatosis or portal venous gas.  Vascular/Lymphatic: No pathologically enlarged lymph nodes identified. No other significant abnormality visualized.  Reproductive:  No mass or other significant  abnormality identified.  Other:  None.  Musculoskeletal:  No suspicious bone lesions identified.  IMPRESSION: Severe colitis or diverticulitis involving the proximal sigmoid colon, with large pericolonic abscess containing stool measuring approximately 6 x 8 cm. Background  Mild diffuse colitis also noted, without other areas of colonic perforation or abscess.  No evidence of free intraperitoneal air.  These results will be called to the ordering clinician or representative by the  Radiologist Assistant, and communication documented in the PACS or zVision Dashboard.   Electronically Signed   By: Earle Gell M.D.   On: 10/26/2014 12:09    Anti-infectives: Anti-infectives    Start     Dose/Rate Route Frequency Ordered Stop   10/26/14 2200  metroNIDAZOLE (FLAGYL) IVPB 500 mg     500 mg 100 mL/hr over 60 Minutes Intravenous Every 8 hours 10/26/14 2015     10/26/14 2100  ciprofloxacin (CIPRO) IVPB 400 mg     400 mg 200 mL/hr over 60 Minutes Intravenous Every 12 hours 10/26/14 2027     10/26/14 1900  fluconazole (DIFLUCAN) IVPB 100 mg     100 mg 50 mL/hr over 60 Minutes Intravenous Every 24 hours 10/26/14 1805     10/26/14 1800  vancomycin (VANCOCIN) 50 mg/mL oral solution 125 mg     125 mg Oral 4 times per day 10/26/14 1655         Assessment/Plan  1. Intra-abdominal abscess with crohn's disease and C diff colitis -abx therapy per primary service -IR has been consulted and will place a drain in this large stool containing abscess today.  We will try to treat the patient conservatively with a drain and abx therapy.  However, if he does not improve, he will likely need a Hartman's procedure. -cont NPO for now. -will follow closely.  Repeat labs in am   LOS: 1 day    Wynne Jury E 10/27/2014, 9:40 AM Pager: [phone]

## 2014-10-27 NOTE — Progress Notes (Signed)
INITIAL NUTRITION ASSESSMENT  DOCUMENTATION CODES Per approved criteria  -Not Applicable   INTERVENTION: -When diet advanced recommend ordering nourishement snacks TID  NUTRITION DIAGNOSIS: Inadequate oral intake related to decreased appetite as evidenced by abdominal pain and pt report.   Goal: Pt to meet >/= 90% of estimated needs  Monitor:  Diet advancement; weight trends, labs  Reason for Assessment:  MST  44 y.o. male  Admitting Dx: Intra-abdominal abscess  ASSESSMENT: Pt with PMH of Chron's disease, c. diff colitis. Presents with abdominal pain on left side ongoing for the past few months and worsening over the last few days.  Pt having bloody diarrhea, has resolved.   Per wt records pt has lost 9% of body weight in the past month (significant for time frame).  Pt reports usual body weight of 212 lbs and last weighed this in November of 2015.  Pt states for the past few months while he has experienced abdominal pain he will go a few weeks where he ate fine and then a few weeks where he could not eat at all.  Pt diet recall of 3 meals a day and snacks in between.  He likes to eat smaller more frequent meals through out the day.   Pt reports high protein supplements make his stomach upset, would prefer 3 snacks a day when diet advanced.  He likes [place] ice and would like a ham and cheese sandwich in the afternoons.   No signs of fat or muscle wasting.   Height: Ht Readings from Last 1 Encounters:  10/26/14 5\' 7"  (1.702 m)    Weight: Wt Readings from Last 1 Encounters:  10/27/14 149 lb 7.6 oz (67.8 kg)    Ideal Body Weight: 148 lbs (67 kg)  % Ideal Body Weight: 100%  Wt Readings from Last 10 Encounters:  10/27/14 149 lb 7.6 oz (67.8 kg)  09/28/14 164 lb 0.4 oz (74.4 kg)  02/23/12 193 lb (87.544 kg)    Usual Body Weight: 212  % Usual Body Weight: 70%  BMI:  Body mass index is 23.41 kg/(m^2).  Estimated Nutritional Needs: Kcal: 1900-2100 kcal Protein:  100-120 g protein Fluid: >/= 1.9 L  Skin: WDL  Diet Order: Diet NPO time specified Except for: Sips with Meds, Ice Chips  EDUCATION NEEDS: -No education needs identified at this time   Intake/Output Summary (Last 24 hours) at 10/27/14 1022 Last data filed at 10/27/14 0843  Gross per 24 hour  Intake   1445 ml  Output    800 ml  Net    645 ml    Last BM: 3/18   Labs:   Recent Labs Lab 10/26/14 1825 10/27/14 0630  NA 129* 134*  K 3.7 4.0  CL 89* 98  CO2 30 29  BUN 11 9  CREATININE 0.99 0.89  CALCIUM 7.9* 8.1*  GLUCOSE 105* 83    CBG (last 3)  No results for input(s): GLUCAP in the last 72 hours.  Scheduled Meds: . ciprofloxacin  400 mg Intravenous Q12H  . fluconazole (DIFLUCAN) IV  100 mg Intravenous Q24H  . [START ON 10/28/2014] heparin  5,000 Units Subcutaneous 3 times per day  . metronidazole  500 mg Intravenous Q8H  . morphine  30 mg Oral Q12H  . vancomycin  125 mg Oral 4 times per day    Continuous Infusions: . sodium chloride 100 mL/hr at 10/27/14 0700    Past Medical History  Diagnosis Date  . Crohn disease   . C. difficile  colitis 09/30/2014  . Ankylosing spondylitis 09/30/2014  . Anxiety     Past Surgical History  Procedure Laterality Date  . Flexible sigmoidoscopy N/A 09/29/2014    Procedure: FLEXIBLE SIGMOIDOSCOPY;  Surgeon: Beryle Beams, MD;  Location: [place];  Service: Endoscopy;  Laterality: N/A;    Elmer Picker MS Dietetic Intern Pager Number [phone]

## 2014-10-27 NOTE — Consult Note (Signed)
Chief Complaint: LLQ pain fever  Referring Physician(s): Hung,Patrick  History of Present Illness: Vincent Black is a 44 y.o. male   Pt with Hx Chrons disease On going treatment for C diff Worsening abd pain over several days Developed fever CT reveals intra abdominal abscess Request for abscess drain placement Imaging has been reviewed with Dr Lyndel Pleasure procedure   Past Medical History  Diagnosis Date  . Crohn disease   . C. difficile colitis 09/30/2014  . Ankylosing spondylitis 09/30/2014  . Anxiety     Past Surgical History  Procedure Laterality Date  . Flexible sigmoidoscopy N/A 09/29/2014    Procedure: FLEXIBLE SIGMOIDOSCOPY;  Surgeon: Beryle Beams, MD;  Location: [place];  Service: Endoscopy;  Laterality: N/A;    Allergies: Review of patient's allergies indicates no known allergies.  Medications: Prior to Admission medications   Medication Sig Start Date End Date Taking? Authorizing Provider  Adalimumab 40 MG/0.8ML PNKT Inject 40 mg into the skin every 14 (fourteen) days.   Yes Historical Provider, MD  ALPRAZolam (XANAX) 0.25 MG tablet Take 0.25 mg by mouth 3 (three) times daily as needed for anxiety.   Yes Historical Provider, MD  azaTHIOprine (IMURAN) 50 MG tablet Take 3.5 tablets (175 mg total) by mouth daily. 10/10/14  Yes Carol Ada, MD  morphine (MS CONTIN) 30 MG 12 hr tablet Take 1 tablet (30 mg total) by mouth every 12 (twelve) hours. 10/10/14  Yes Carol Ada, MD  oxyCODONE-acetaminophen (PERCOCET/ROXICET) 5-325 MG per tablet Take 1 tablet by mouth every 6 (six) hours as needed for moderate pain or severe pain.   Yes Historical Provider, MD  predniSONE (DELTASONE) 20 MG tablet Take 2 tablets (40 mg total) by mouth daily with breakfast. Patient taking differently: Take 15 mg by mouth daily with breakfast.  10/10/14  Yes Carol Ada, MD  Probiotic Product (PROBIOTIC PO) Take 1 tablet by mouth daily. Chewable   Yes Historical Provider, MD    vancomycin (VANCOCIN) 50 mg/mL oral solution Take 10 mLs (500 mg total) by mouth every 6 (six) hours. Patient taking differently: Take 250 mg by mouth every 4 (four) hours.  10/10/14  Yes Carol Ada, MD     History reviewed. No pertinent family history.  History   Social History  . Marital Status: Married    Spouse Name: N/A  . Number of Children: N/A  . Years of Education: N/A   Social History Main Topics  . Smoking status: Former Smoker -- 0.50 packs/day for 10 years    Types: Cigarettes  . Smokeless tobacco: Never Used     Comment: 'quit smoking in ~ 2014"  . Alcohol Use: Yes     Comment: 10/25/2014 "might have a drink a couple times/yr"  . Drug Use: No  . Sexual Activity: Not Currently   Other Topics Concern  . None   Social History Narrative     Review of Systems: A 12 point ROS discussed and pertinent positives are indicated in the HPI above.  All other systems are negative.  Review of Systems  Constitutional: Positive for fever, activity change and appetite change. Negative for unexpected weight change.  Respiratory: Negative for cough and shortness of breath.   Gastrointestinal: Positive for abdominal pain, diarrhea, abdominal distention and rectal pain.  Psychiatric/Behavioral: Negative for behavioral problems and confusion.    Vital Signs: BP 110/73 mmHg  Pulse 80  Temp(Src) 97.8 F (36.6 C) (Oral)  Resp 12  Ht 5\' 7"  (1.702 m)  Wt 67.8 kg (149 lb 7.6 oz)  BMI 23.41 kg/m2  SpO2 99%  Physical Exam  Constitutional: He is oriented to person, place, and time. He appears well-developed and well-nourished.  Cardiovascular: Normal rate and regular rhythm.   No murmur heard. Pulmonary/Chest: Effort normal and breath sounds normal. He has no wheezes.  Abdominal: Soft. Bowel sounds are normal. There is tenderness.  Musculoskeletal: Normal range of motion.  Neurological: He is alert and oriented to person, place, and time.  Skin: Skin is warm and dry.   Psychiatric: He has a normal mood and affect. His behavior is normal. Thought content normal.  Nursing note and vitals reviewed.   Mallampati Score:  MD Evaluation Airway: WNL Abdomen: WNL Chest/ Lungs: WNL ASA  Classification: 2 Mallampati/Airway Score: One  Imaging: Ct Abdomen Pelvis W Contrast  10/26/2014   CLINICAL DATA:  Generalized abdominal pain. Nausea and vomiting. Fever. C difficile colitis.  EXAM: CT ABDOMEN AND PELVIS WITH CONTRAST  TECHNIQUE: Multidetector CT imaging of the abdomen and pelvis was performed using the standard protocol following bolus administration of intravenous contrast.  CONTRAST:  111mL OMNIPAQUE IOHEXOL 300 MG/ML  SOLN  COMPARISON:  07/11/2014  FINDINGS: Lower Chest:  Unremarkable.  Hepatobiliary: Mild hepatic steatosis. No liver masses are identified. Gallbladder is unremarkable. Tiny sub-cm cyst again seen in the inferior right hepatic lobe.  Pancreas: No mass, inflammatory changes, or other significant abnormality identified.  Spleen:  Within normal limits in size and appearance.  Adrenals:  No masses identified.  Kidneys/Urinary Tract:  No evidence of masses or hydronephrosis.  Stomach/Bowel/Peritoneum: Mild diffuse colonic wall thickening and mucosal enhancement is again demonstrated, consistent with diffuse colitis. However, there is a more cysts severe area of colonic wall thickening and pericolonic inflammatory change involving the proximal sigmoid colon with a large extraluminal collection containing gas stool and fluid which measures approximately 6.3 x 8.4 cm. This is consistent with a pericolonic abscess. There is no evidence of free fluid or bowel obstruction. No evidence of pneumatosis or portal venous gas.  Vascular/Lymphatic: No pathologically enlarged lymph nodes identified. No other significant abnormality visualized.  Reproductive:  No mass or other significant abnormality identified.  Other:  None.  Musculoskeletal:  No suspicious bone lesions  identified.  IMPRESSION: Severe colitis or diverticulitis involving the proximal sigmoid colon, with large pericolonic abscess containing stool measuring approximately 6 x 8 cm. Background  Mild diffuse colitis also noted, without other areas of colonic perforation or abscess.  No evidence of free intraperitoneal air.  These results will be called to the ordering clinician or representative by the Radiologist Assistant, and communication documented in the PACS or zVision Dashboard.   Electronically Signed   By: Earle Gell M.D.   On: 10/26/2014 12:09    Labs:  CBC:  Recent Labs  10/09/14 0535 10/10/14 0612 10/26/14 1825 10/27/14 0630  WBC 22.1* 26.7* 14.6* 11.9*  HGB 9.2* 9.8* 9.0* 8.0*  HCT 28.4* 30.8* 27.9* 25.0*  PLT 669* 698* 782* 626*    COAGS:  Recent Labs  10/27/14 0630  INR 1.36  APTT 34    BMP:  Recent Labs  10/09/14 0535 10/10/14 0612 10/26/14 1825 10/27/14 0630  NA 135 134* 129* 134*  K 3.8 4.1 3.7 4.0  CL 96 92* 89* 98  CO2 34* 31 30 29   GLUCOSE 102* 108* 105* 83  BUN 8 6 11 9   CALCIUM 8.4 8.3* 7.9* 8.1*  CREATININE 1.02 0.99 0.99 0.89  GFRNONAA 89* >90 >90 >90  GFRAA >90 >90 >90 >90    LIVER FUNCTION TESTS:  Recent Labs  10/01/14 0600 10/07/14 0525 10/26/14 1825 10/27/14 0630  BILITOT 0.2* 0.3 0.8 0.3  AST 15 21 17 10   ALT 10 9 9 7   ALKPHOS 39 65 81 68  PROT 4.9* 5.8* 6.1 5.3*  ALBUMIN 1.5* 1.9* 2.0* 1.7*    TUMOR MARKERS: No results for input(s): AFPTM, CEA, CA199, CHROMGRNA in the last 8760 hours.  Assessment and Plan:  Chron's dz New LLQ pain; fever CT reveals abscess Now scheduled for drain placement Risks and Benefits discussed with the patient including bleeding, infection, damage to adjacent structures, bowel perforation/fistula connection, and sepsis. All of the patient's questions were answered, patient is agreeable to proceed. Consent signed and in chart.    Thank you for this interesting consult.  I greatly enjoyed  meeting Vincent Black and look forward to participating in their care.  Signed: Deland Slocumb A 10/27/2014, 8:51 AM   I spent a total of 40 Minutes  in face to face in clinical consultation, greater than 50% of which was counseling/coordinating care for abscess drain placement

## 2014-10-27 NOTE — Sedation Documentation (Signed)
Patient is resting comfortably. 

## 2014-10-27 NOTE — Progress Notes (Signed)
Patient ID: Vincent Black, male   DOB: [birth year], 44 y.o.   MRN: [id number]  [facility] PROGRESS NOTE  MARTICE DOTY AYT:[id number] DOB: [birth year] DOA: 10/26/2014 PCP: No PCP Per Patient   Brief narrative:      Assessment/Plan:    Principal Problem:   Sepsis secondary to severe diverticulitis and intra abd abscess - criteria for sepsis met on admission with T 102.2, HR 110, BP 110/73, WBC 14.6 - placed on ABX: Cipro and Flagyl IV - oral vanc continued for recent C. Diff treatment, pharmacy called to assist with dates of the treatment initiation  - WBC is trending down: 14.6 --> 11.9 - repeat CBC in AM, check lactic acid level - sepsis order set placed    Intra-abdominal abscess - appreciate IR following and assisting, plan for drain placement  - ABX as noted above    Severe diverticulitis - ABX as noted above - keep NPO for now  Active Problems:   Recent C. Diff - continue oral vancomycin for now    Crohn's disease - appreciate GI team following    Normocytic anemia, secondary to Crohn's disease - appears that Hg at baseline typically ~8 - on admission Hg 9 and dropped down to 8 this AM, likely dilutional component from IVF pt has received - no signs of active bleeding - repeat CBC in AM   Hyponatremia - from pre renal etiology and dehydration in the setting of sepsis - IVF provided, Na is trending up    Oral thrush - continue Fluconazole    Moderate PCM - in the context of acute illness - has to be NPO for now until abscess drained    Thrombocytosis - monitor   DVT prophylaxis - Heparin SQ  Code Status: Full.  Family Communication:  plan of care discussed with the patient Disposition Plan: Home when stable.   IV access:  Peripheral IV  Procedures and diagnostic studies:    Ct Abdomen Pelvis W Contrast  10/26/2014    Severe colitis or diverticulitis involving the proximal sigmoid colon, with large pericolonic abscess containing stool measuring  approximately 6 x 8 cm. Background  Mild diffuse colitis also noted, without other areas of colonic perforation or abscess.  No evidence of free intraperitoneal air.    Medical Consultants:  GI - Dr. Benson Norway IR  Other Consultants:  None  IAnti-Infectives:   Ciprofloxacin IV 3/17 --> Metronidazole IV 3/17 --> Vancomycin PO 3/17 --> Fluconazole IV 3/17 -->  Faye Ramsay, MD  [facility] Pager [phone]   If 7PM-7AM, please contact night-coverage [email or web address] Password [email or web address] 10/27/2014, 9:02 AM   LOS: 1 day   HPI/Subjective: No events overnight.   Objective: Filed Vitals:   10/26/14 2054 10/26/14 2337 10/27/14 0500 10/27/14 0604  BP:  123/77  110/73  Pulse:  83  80  Temp: 102.2 F (39 C) 99.6 F (37.6 C)  97.8 F (36.6 C)  TempSrc: Oral Oral  Oral  Resp:  13  12  Height:      Weight:   67.8 kg (149 lb 7.6 oz)   SpO2:  99%  99%    Intake/Output Summary (Last 24 hours) at 10/27/14 0902 Last data filed at 10/27/14 0843  Gross per 24 hour  Intake   1440 ml  Output    800 ml  Net    640 ml    Exam:   General:  Pt is alert, follows commands appropriately, not in acute distress  Cardiovascular: Regular rhythm, tachycardic, S1/S2,  no murmurs, no rubs, no gallops  Respiratory: Clear to auscultation bilaterally, no wheezing, no crackles, no rhonchi  Abdomen: Soft, tender in epigastric area, non distended, bowel sounds present, no guarding  Extremities: No edema, pulses DP and PT palpable bilaterally  Neuro: Grossly nonfocal  Data Reviewed: Basic Metabolic Panel:  Recent Labs Lab 10/26/14 1825 10/27/14 0630  NA 129* 134*  K 3.7 4.0  CL 89* 98  CO2 30 29  GLUCOSE 105* 83  BUN 11 9  CREATININE 0.99 0.89  CALCIUM 7.9* 8.1*   Liver Function Tests:  Recent Labs Lab 10/26/14 1825 10/27/14 0630  AST 17 10  ALT 9 7  ALKPHOS 81 68  BILITOT 0.8 0.3  PROT 6.1 5.3*  ALBUMIN 2.0* 1.7*   CBC:  Recent Labs Lab 10/26/14 1825 10/27/14 0630  WBC 14.6*  11.9*  HGB 9.0* 8.0*  HCT 27.9* 25.0*  MCV 92.7 92.9  PLT 782* 626*   Scheduled Meds: . ciprofloxacin  400 mg Intravenous Q12H  . fluconazole  IV  100 mg Intravenous Q24H  . heparin  5,000 Units Subcutaneous 3 times per day  . metronidazole  500 mg Intravenous Q8H  . morphine  30 mg Oral Q12H  . vancomycin  125 mg Oral 4 times per day   Continuous Infusions: . sodium chloride 100 mL/hr at 10/26/14 2103

## 2014-10-27 NOTE — Progress Notes (Signed)
Subjective: Better when he obtains the pain medications.  Objective: Vital signs in last 24 hours: Temp:  [97.8 F (36.6 C)-102.2 F (39 C)] 97.8 F (36.6 C) (03/18 0604) Pulse Rate:  [80-110] 80 (03/18 0604) Resp:  [12-18] 12 (03/18 0604) BP: (110-125)/(73-105) 110/73 mmHg (03/18 0604) SpO2:  [99 %-100 %] 99 % (03/18 0604) Weight:  [67.8 kg (149 lb 7.6 oz)] 67.8 kg (149 lb 7.6 oz) (03/18 0500) Last BM Date: 10/26/14  Intake/Output from previous day: 03/17 0701 - 03/18 0700 In: 1440 [I.V.:990; IV Piggyback:450] Out: 800 [Urine:800] Intake/Output this shift:    No performed.  Being evaluated by IR.  Lab Results:  Recent Labs  10/26/14 1825 10/27/14 0630  WBC 14.6* 11.9*  HGB 9.0* 8.0*  HCT 27.9* 25.0*  PLT 782* 626*   BMET  Recent Labs  10/26/14 1825 10/27/14 0630  NA 129* 134*  K 3.7 4.0  CL 89* 98  CO2 30 29  GLUCOSE 105* 83  BUN 11 9  CREATININE 0.99 0.89  CALCIUM 7.9* 8.1*   LFT  Recent Labs  10/27/14 0630  PROT 5.3*  ALBUMIN 1.7*  AST 10  ALT 7  ALKPHOS 68  BILITOT 0.3   PT/INR  Recent Labs  10/27/14 0630  LABPROT 16.9*  INR 1.36   Hepatitis Panel No results for input(s): HEPBSAG, HCVAB, HEPAIGM, HEPBIGM in the last 72 hours. C-Diff No results for input(s): CDIFFTOX in the last 72 hours. Fecal Lactopherrin No results for input(s): FECLLACTOFRN in the last 72 hours.  Studies/Results: Ct Abdomen Pelvis W Contrast  10/26/2014   CLINICAL DATA:  Generalized abdominal pain. Nausea and vomiting. Fever. C difficile colitis.  EXAM: CT ABDOMEN AND PELVIS WITH CONTRAST  TECHNIQUE: Multidetector CT imaging of the abdomen and pelvis was performed using the standard protocol following bolus administration of intravenous contrast.  CONTRAST:  116mL OMNIPAQUE IOHEXOL 300 MG/ML  SOLN  COMPARISON:  07/11/2014  FINDINGS: Lower Chest:  Unremarkable.  Hepatobiliary: Mild hepatic steatosis. No liver masses are identified. Gallbladder is unremarkable. Tiny  sub-cm cyst again seen in the inferior right hepatic lobe.  Pancreas: No mass, inflammatory changes, or other significant abnormality identified.  Spleen:  Within normal limits in size and appearance.  Adrenals:  No masses identified.  Kidneys/Urinary Tract:  No evidence of masses or hydronephrosis.  Stomach/Bowel/Peritoneum: Mild diffuse colonic wall thickening and mucosal enhancement is again demonstrated, consistent with diffuse colitis. However, there is a more cysts severe area of colonic wall thickening and pericolonic inflammatory change involving the proximal sigmoid colon with a large extraluminal collection containing gas stool and fluid which measures approximately 6.3 x 8.4 cm. This is consistent with a pericolonic abscess. There is no evidence of free fluid or bowel obstruction. No evidence of pneumatosis or portal venous gas.  Vascular/Lymphatic: No pathologically enlarged lymph nodes identified. No other significant abnormality visualized.  Reproductive:  No mass or other significant abnormality identified.  Other:  None.  Musculoskeletal:  No suspicious bone lesions identified.  IMPRESSION: Severe colitis or diverticulitis involving the proximal sigmoid colon, with large pericolonic abscess containing stool measuring approximately 6 x 8 cm. Background  Mild diffuse colitis also noted, without other areas of colonic perforation or abscess.  No evidence of free intraperitoneal air.  These results will be called to the ordering clinician or representative by the Radiologist Assistant, and communication documented in the PACS or zVision Dashboard.   Electronically Signed   By: Earle Gell M.D.   On: 10/26/2014 12:09  Medications:  Scheduled: . ciprofloxacin  400 mg Intravenous Q12H  . fluconazole (DIFLUCAN) IV  100 mg Intravenous Q24H  . [START ON 10/28/2014] heparin  5,000 Units Subcutaneous 3 times per day  . metronidazole  500 mg Intravenous Q8H  . morphine  30 mg Oral Q12H  . vancomycin   125 mg Oral 4 times per day   Continuous: . sodium chloride 100 mL/hr at 10/26/14 2103    Assessment/Plan: 1) Sigmoid colon abscess. 2) Crohn's disease. 3) Recent C. Diff infection.   The plan is for him to undergo a drainage of the abscess today with IR.  This should improve his clinical status.  He was restarted on metronidazole and cipro.  The cipro can worsen his C. Diff and this issue will need to be watched.  An ID consult will be called.  Plan: 1) Abscess drainage. 2) ID Consult. 3) Continue with the current antibiotic regimen.   LOS: 1 day   Vincent Black D 10/27/2014, 8:45 AM

## 2014-10-28 DIAGNOSIS — R1032 Left lower quadrant pain: Secondary | ICD-10-CM

## 2014-10-28 DIAGNOSIS — K651 Peritoneal abscess: Secondary | ICD-10-CM

## 2014-10-28 DIAGNOSIS — R634 Abnormal weight loss: Secondary | ICD-10-CM

## 2014-10-28 DIAGNOSIS — K50914 Crohn's disease, unspecified, with abscess: Secondary | ICD-10-CM

## 2014-10-28 DIAGNOSIS — R1310 Dysphagia, unspecified: Secondary | ICD-10-CM

## 2014-10-28 DIAGNOSIS — D509 Iron deficiency anemia, unspecified: Secondary | ICD-10-CM

## 2014-10-28 DIAGNOSIS — E43 Unspecified severe protein-calorie malnutrition: Secondary | ICD-10-CM

## 2014-10-28 DIAGNOSIS — M459 Ankylosing spondylitis of unspecified sites in spine: Secondary | ICD-10-CM

## 2014-10-28 LAB — COMPREHENSIVE METABOLIC PANEL
ALT: 6 U/L (ref 0–53)
AST: 10 U/L (ref 0–37)
Albumin: 1.5 g/dL — ABNORMAL LOW (ref 3.5–5.2)
Alkaline Phosphatase: 65 U/L (ref 39–117)
Anion gap: 9 (ref 5–15)
CO2: 23 mmol/L (ref 19–32)
CREATININE: 0.88 mg/dL (ref 0.50–1.35)
Calcium: 7.4 mg/dL — ABNORMAL LOW (ref 8.4–10.5)
Chloride: 98 mmol/L (ref 96–112)
GFR calc Af Amer: >90 mL/min (ref >90)
GFR calc non Af Amer: >90 mL/min (ref >90)
GLUCOSE: 84 mg/dL (ref 70–99)
Potassium: 3.5 mmol/L (ref 3.5–5.1)
SODIUM: 130 mmol/L — AB (ref 135–145)
Total Bilirubin: 0.5 mg/dL (ref 0.3–1.2)
Total Protein: 4.7 g/dL — ABNORMAL LOW (ref 6.0–8.3)

## 2014-10-28 LAB — CBC
HCT: 24.3 % — ABNORMAL LOW (ref 39.0–52.0)
Hemoglobin: 7.8 g/dL — ABNORMAL LOW (ref 13.0–17.0)
MCH: 29.7 pg (ref 26.0–34.0)
MCHC: 32.1 g/dL (ref 30.0–36.0)
MCV: 92.4 fL (ref 78.0–100.0)
Platelets: 704 10*3/uL — ABNORMAL HIGH (ref 150–400)
RBC: 2.63 MIL/uL — ABNORMAL LOW (ref 4.22–5.81)
RDW: 16.3 % — ABNORMAL HIGH (ref 11.5–15.5)
WBC: 11.8 10*3/uL — AB (ref 4.0–10.5)

## 2014-10-28 LAB — LACTIC ACID, PLASMA
Lactic Acid, Venous: 0.7 mmol/L (ref 0.5–2.0)
Lactic Acid, Venous: 0.7 mmol/L (ref 0.5–2.0)

## 2014-10-28 MED ORDER — PIPERACILLIN-TAZOBACTAM 3.375 G IVPB
3.3750 g | Freq: Three times a day (TID) | INTRAVENOUS | Status: DC
  Administered 2014-10-28 – 2014-10-31 (×10): 3.375 g via INTRAVENOUS
  Filled 2014-10-28 (×13): qty 50

## 2014-10-28 MED ORDER — MAGIC MOUTHWASH
10.0000 mL | Freq: Four times a day (QID) | ORAL | Status: DC
  Administered 2014-10-28 – 2014-11-08 (×36): 10 mL via ORAL
  Filled 2014-10-28 (×48): qty 10

## 2014-10-28 NOTE — Progress Notes (Signed)
[facility] for Infectious Disease    Date of Admission:  10/26/2014   Total days of antibiotics 3        Day 3 cipro, metro, fluc, oral vanco           ID: Vincent Black is a 44 y.o. male with  Crohn's disease, ankylosis spondylitis, with hx of  recurrent cdifficile entiriti now presents with large pericolonic abscess s/p IR drain placement on 3/18 Principal Problem:   Pericolonic abscess Active Problems:   Crohn's disease   History of Clostridium difficile colitis   Protein-calorie malnutrition, severe   Ankylosing spondylitis   Unintentional weight loss   Normocytic anemia   Odynophagia    Subjective:  Fever curve is trending down, currently afebrile. He doesn't think he is having c.difficile since no numerous loose stools in the last few days, he did have one loose stool this morning since he states he has had little to eat. He has sharp abd pain to llq where drain is at. feculant material in bulb  WBC trending down, likely from drainage of pericolonic abscess measuring 6.3 x 8.4 cm on CT Medications:  . ciprofloxacin  400 mg Intravenous Q12H  . fluconazole (DIFLUCAN) IV  100 mg Intravenous Q24H  . heparin  5,000 Units Subcutaneous 3 times per day  . HYDROmorphone PCA 0.3 mg/mL   Intravenous 6 times per day  . magic mouthwash  10 mL Oral QID  . morphine  30 mg Oral Q12H  . vancomycin  125 mg Oral 4 times per day    Objective: Vital signs in last 24 hours: Temp:  [98 F (36.7 C)-100.9 F (38.3 C)] 98.8 F (37.1 C) (03/19 0704) Pulse Rate:  [87-123] 123 (03/19 0704) Resp:  [8-23] 16 (03/19 0800) BP: (108-145)/(69-96) 141/96 mmHg (03/19 0704) SpO2:  [95 %-100 %] 95 % (03/19 0800) Weight:  [157 lb 4.8 oz (71.351 kg)-160 lb 4.4 oz (72.7 kg)] 157 lb 4.8 oz (71.351 kg) (03/19 0701) Physical Exam  Constitutional: He is oriented to person, place, and time. He appears well-developed and well-nourished. No distress.  HENT:  Mouth/Throat: Oropharynx is clear and  moist. No oropharyngeal exudate.  Cardiovascular: Normal rate, regular rhythm and normal heart sounds. Exam reveals no gallop and no friction rub.  No murmur heard.  Pulmonary/Chest: Effort normal and breath sounds normal. No respiratory distress. He has no wheezes.  Abdominal: Soft. Bowel sounds are decreased. No distention. Some tenderness with palpation but no guarding Neurological: He is alert and oriented to person, place, and time.  Skin: Skin is warm and dry. No rash noted. No erythema.  Psychiatric: He has a normal mood and affect. His behavior is normal.     Lab Results  Recent Labs  10/27/14 0630 10/28/14 0243  WBC 11.9* 11.8*  HGB 8.0* 7.8*  HCT 25.0* 24.3*  NA 134* 130*  K 4.0 3.5  CL 98 98  CO2 29 23  BUN 9 <5*  CREATININE 0.89 0.88   Liver Panel  Recent Labs  10/27/14 0630 10/28/14 0243  PROT 5.3* 4.7*  ALBUMIN 1.7* 1.5*  AST 10 10  ALT 7 6  ALKPHOS 68 65  BILITOT 0.3 0.5   Sedimentation Rate  Recent Labs  10/27/14 0630  ESRSEDRATE 112*   C-Reactive Protein  Recent Labs  10/27/14 0630  CRP 24.4*    Microbiology: 3/18 aspirate fluid pending 3/17 blood cx ngtd Studies/Results: Ct Abdomen Pelvis W Contrast  10/26/2014   CLINICAL DATA:  Generalized abdominal pain. Nausea and vomiting. Fever. C difficile colitis.  EXAM: CT ABDOMEN AND PELVIS WITH CONTRAST  TECHNIQUE: Multidetector CT imaging of the abdomen and pelvis was performed using the standard protocol following bolus administration of intravenous contrast.  CONTRAST:  170mL OMNIPAQUE IOHEXOL 300 MG/ML  SOLN  COMPARISON:  07/11/2014  FINDINGS: Lower Chest:  Unremarkable.  Hepatobiliary: Mild hepatic steatosis. No liver masses are identified. Gallbladder is unremarkable. Tiny sub-cm cyst again seen in the inferior right hepatic lobe.  Pancreas: No mass, inflammatory changes, or other significant abnormality identified.  Spleen:  Within normal limits in size and appearance.  Adrenals:  No  masses identified.  Kidneys/Urinary Tract:  No evidence of masses or hydronephrosis.  Stomach/Bowel/Peritoneum: Mild diffuse colonic wall thickening and mucosal enhancement is again demonstrated, consistent with diffuse colitis. However, there is a more cysts severe area of colonic wall thickening and pericolonic inflammatory change involving the proximal sigmoid colon with a large extraluminal collection containing gas stool and fluid which measures approximately 6.3 x 8.4 cm. This is consistent with a pericolonic abscess. There is no evidence of free fluid or bowel obstruction. No evidence of pneumatosis or portal venous gas.  Vascular/Lymphatic: No pathologically enlarged lymph nodes identified. No other significant abnormality visualized.  Reproductive:  No mass or other significant abnormality identified.  Other:  None.  Musculoskeletal:  No suspicious bone lesions identified.  IMPRESSION: Severe colitis or diverticulitis involving the proximal sigmoid colon, with large pericolonic abscess containing stool measuring approximately 6 x 8 cm. Background  Mild diffuse colitis also noted, without other areas of colonic perforation or abscess.  No evidence of free intraperitoneal air.  These results will be called to the ordering clinician or representative by the Radiologist Assistant, and communication documented in the PACS or zVision Dashboard.   Electronically Signed   By: Earle Gell M.D.   On: 10/26/2014 12:09   Ct Image Guided Drainage By Percutaneous Catheter  10/28/2014   CLINICAL DATA:  44 year old male with Crohn's disease and colonic perforation with large intraperitoneal abscess. CT-guided drain placement is warranted.  EXAM: CT IMAGE GUIDED DRAINAGE BY PERCUTANEOUS CATHETER  Date: 10/28/2014  PROCEDURE: 1. Placement of 110 French drainage catheter under CT guidance Interventional Radiologist:  Criselda Peaches, MD  ANESTHESIA/SEDATION: Moderate (conscious) sedation was used. 2 mg Versed, 1 mcg  Fentanyl were administered intravenously. The patient's vital signs were monitored continuously by radiology nursing throughout the procedure.  Sedation Time: 12 minutes  MEDICATIONS: None additional  TECHNIQUE: Informed consent was obtained from the patient following explanation of the procedure, risks, benefits and alternatives. The patient understands, agrees and consents for the procedure. All questions were addressed. A time out was performed.  A planning axial CT scan was performed. The colonic perforation was localized. A suitable skin entry site was selected and marked. The region was then sterilely prepped and draped in standard fashion with Betadine skin prep. Local anesthesia was attained by infiltration with 1% lidocaine. Using intermittent CT fluoroscopic guidance, an 18 gauge trocar needle was advanced into the fluid collection. A 0.035 inch wire was then coiled within the fluid collection in the tract dilated to 14 [place]. A Cook 38 French drainage catheter was then advanced over the wire and formed within the fluid collection.  Aspiration yielded approximately 50 mL of frankly feculent material. A sample was sent for culture. The catheter was then flushed and secured to the skin with 0 Prolene suture. The catheter was connected to JP bulb suction. Post drainage  CT imaging demonstrates near-total aspiration of the abscess cavity. There is no evidence of complication.  COMPLICATIONS: None  IMPRESSION: 1. Successful placement of a 14 French drainage catheter into the pericolonic fluid and gas collection. Aspiration the yields 50 mL frankly feculent material.  PLAN: 1. Maintain tube to JP bulb drainage until leukocytosis has normalized and clinical picture has improved. After that, recommend conversion to gravity bag drainage to minimize the risk for fistula formation. 2. Given the feculent nature of the fluid and the underlying Crohn's disease, patient is at very high likelihood of developing a  fistulous connection between the catheter and bowel. Drainage catheter will likely be in place for an extended period. Recommend contrast injection under fluoroscopy prior to drain removal. 3. Patient to be followed in IR drain clinic at 2 weeks post discharge. Signed,  Criselda Peaches, MD  [facility]  [facility]   Electronically Signed   By: [name] M.D.   On: 10/28/2014 08:47     Assessment/Plan: Intra-abdominal abscess = fine to switch to piptazo for now until we have cx results to help target antibiotics. Defer to surgery for need of resection/hartmann's pouch  Loose stool = if he has further loose bm today, makes sense to test since he is high risk for relapse. If no loose bm today, would d/c cdiff testing and d/c oral vanco.  Hx of cdifficile = he is at risk for relpase due to re exposure to antibiotics, but somewhat unavoidable given his intra-abd abscess  [facility], [facility] for Infectious Diseases Cell: [phone] Pager: [phone]  10/28/2014, 10:09 AM

## 2014-10-28 NOTE — Progress Notes (Signed)
[name]  Subjective:  GI, surgery, IR, ID, and IM all on board at this point.  IV Cipro and Flagyl changed to Zosyn today.  On PO vanco due to high risk of relapse of Cdiff.  Had drain placed my IR yesterday.  Still has a lot of pain, but says that it is different pain, stabbing, at drain site.  Had loose BM last PM.    Objective:  Vital signs in last 24 hours: Temp:  [98 F (36.7 C)-100.9 F (38.3 C)] 98.8 F (37.1 C) (03/19 0704) Pulse Rate:  [87-123] 123 (03/19 0704) Resp:  [8-23] 15 (03/19 0704) BP: (108-145)/(69-96) 141/96 mmHg (03/19 0704) SpO2:  [95 %-100 %] 96 % (03/19 0704) Weight:  [157 lb 4.8 oz (71.351 kg)-160 lb 4.4 oz (72.7 kg)] 157 lb 4.8 oz (71.351 kg) (03/19 0701) Last BM Date: 10/27/14 General:  Alert, Well-developed, in NAD Heart:  Tachy; no murmurs Pulm:  CTAB.  No W/R/R. Abdomen:  Soft, non-distended.  BS present.  Moderate left sided TTP.  JP drain in place with minimal output currently. Extremities:  Without edema. Neurologic:  Alert and  oriented x4;  grossly normal neurologically. Psych:  Alert and cooperative. Normal mood and affect.  Intake/Output from previous day: 03/18 0701 - 03/19 0700 In: 825 [I.V.:600; IV Piggyback:200] Out: 570 [Urine:520; Drains:50]  Lab Results:  Recent Labs  10/26/14 1825 10/27/14 0630 10/28/14 0243  WBC 14.6* 11.9* 11.8*  HGB 9.0* 8.0* 7.8*  HCT 27.9* 25.0* 24.3*  PLT 782* 626* 704*   BMET  Recent Labs  10/26/14 1825 10/27/14 0630 10/28/14 0243  NA 129* 134* 130*  K 3.7 4.0 3.5  CL 89* 98 98  CO2 30 29 23   GLUCOSE 105* 83 84  BUN 11 9 <5*  CREATININE 0.99 0.89 0.88  CALCIUM 7.9* 8.1* 7.4*   LFT  Recent Labs  10/28/14 0243  PROT 4.7*  ALBUMIN 1.5*  AST 10  ALT 6  ALKPHOS 65  BILITOT 0.5   PT/INR  Recent Labs  10/27/14 0630  LABPROT 16.9*  INR 1.36   Ct Abdomen Pelvis W Contrast  10/26/2014   CLINICAL DATA:  Generalized abdominal pain. Nausea and  vomiting. Fever. C difficile colitis.  EXAM: CT ABDOMEN AND PELVIS WITH CONTRAST  TECHNIQUE: Multidetector CT imaging of the abdomen and pelvis was performed using the standard protocol following bolus administration of intravenous contrast.  CONTRAST:  187mL OMNIPAQUE IOHEXOL 300 MG/ML  SOLN  COMPARISON:  07/11/2014  FINDINGS: Lower Chest:  Unremarkable.  Hepatobiliary: Mild hepatic steatosis. No liver masses are identified. Gallbladder is unremarkable. Tiny sub-cm cyst again seen in the inferior right hepatic lobe.  Pancreas: No mass, inflammatory changes, or other significant abnormality identified.  Spleen:  Within normal limits in size and appearance.  Adrenals:  No masses identified.  Kidneys/Urinary Tract:  No evidence of masses or hydronephrosis.  Stomach/Bowel/Peritoneum: Mild diffuse colonic wall thickening and mucosal enhancement is again demonstrated, consistent with diffuse colitis. However, there is a more cysts severe area of colonic wall thickening and pericolonic inflammatory change involving the proximal sigmoid colon with a large extraluminal collection containing gas stool and fluid which measures approximately 6.3 x 8.4 cm. This is consistent with a pericolonic abscess. There is no evidence of free fluid or bowel obstruction. No evidence of pneumatosis or portal venous gas.  Vascular/Lymphatic: No pathologically enlarged lymph nodes identified. No other significant abnormality visualized.  Reproductive:  No mass or other significant abnormality  identified.  Other:  None.  Musculoskeletal:  No suspicious bone lesions identified.  IMPRESSION: Severe colitis or diverticulitis involving the proximal sigmoid colon, with large pericolonic abscess containing stool measuring approximately 6 x 8 cm. Background  Mild diffuse colitis also noted, without other areas of colonic perforation or abscess.  No evidence of free intraperitoneal air.  These results will be called to the ordering clinician or  representative by the Radiologist Assistant, and communication documented in the PACS or zVision Dashboard.   Electronically Signed   By: Earle Gell M.D.   On: 10/26/2014 12:09   Ct Image Guided Drainage By Percutaneous Catheter  10/28/2014   CLINICAL DATA:  44 year old male with Crohn's disease and colonic perforation with large intraperitoneal abscess. CT-guided drain placement is warranted.  EXAM: CT IMAGE GUIDED DRAINAGE BY PERCUTANEOUS CATHETER  Date: 10/28/2014  PROCEDURE: 1. Placement of 41 French drainage catheter under CT guidance Interventional Radiologist:  Criselda Peaches, MD  ANESTHESIA/SEDATION: Moderate (conscious) sedation was used. 2 mg Versed, 1 mcg Fentanyl were administered intravenously. The patient's vital signs were monitored continuously by radiology nursing throughout the procedure.  Sedation Time: 12 minutes  MEDICATIONS: None additional  TECHNIQUE: Informed consent was obtained from the patient following explanation of the procedure, risks, benefits and alternatives. The patient understands, agrees and consents for the procedure. All questions were addressed. A time out was performed.  A planning axial CT scan was performed. The colonic perforation was localized. A suitable skin entry site was selected and marked. The region was then sterilely prepped and draped in standard fashion with Betadine skin prep. Local anesthesia was attained by infiltration with 1% lidocaine. Using intermittent CT fluoroscopic guidance, an 18 gauge trocar needle was advanced into the fluid collection. A 0.035 inch wire was then coiled within the fluid collection in the tract dilated to 14 [place]. A Cook 27 French drainage catheter was then advanced over the wire and formed within the fluid collection.  Aspiration yielded approximately 50 mL of frankly feculent material. A sample was sent for culture. The catheter was then flushed and secured to the skin with 0 Prolene suture. The catheter was connected to  JP bulb suction. Post drainage CT imaging demonstrates near-total aspiration of the abscess cavity. There is no evidence of complication.  COMPLICATIONS: None  IMPRESSION: 1. Successful placement of a 14 French drainage catheter into the pericolonic fluid and gas collection. Aspiration the yields 50 mL frankly feculent material.  PLAN: 1. Maintain tube to JP bulb drainage until leukocytosis has normalized and clinical picture has improved. After that, recommend conversion to gravity bag drainage to minimize the risk for fistula formation. 2. Given the feculent nature of the fluid and the underlying Crohn's disease, patient is at very high likelihood of developing a fistulous connection between the catheter and bowel. Drainage catheter will likely be in place for an extended period. Recommend contrast injection under fluoroscopy prior to drain removal. 3. Patient to be followed in IR drain clinic at 2 weeks post discharge. Signed,  Criselda Peaches, MD  [facility]  [facility]   Electronically Signed   By: [name] M.D.   On: 10/28/2014 08:47    Assessment / Plan: 1) Sigmoid colon abscess s/p placement of 45F drain into LLQ on 3/18.  IR noted that there is extremely high likelihood of fistula to colon given large rupture site.  Management of drain per IR.  On IV Zosyn (switched from Cipro and Flagyl today).  Surgery is following as well.  Continue NPO with ice chips only for now (and likely over the weekend) per surgery. 2) Crohn's disease:  Has been on Humira. 3) Recent C. Diff infection:  Placed back on PO vancomycin 125 mg four times daily per ID to prevent relapse.  Had a loose stool last PM so study to be sent for Cdiff PCR if this occurs again today.    LOS: 2 days   ZEHR, JESSICA D.  10/28/2014, 9:07 AM  Pager number [phone]  GI ATTENDING (covering for Dr. Benson Norway)  Interval history data reviewed. Patient personally seen and examined.  Family in room. Agree with interval progress note. Infectious disease recommendations noted. Patient had drainage of the abscess yesterday around 5 PM. States he does not feel any better today. However, no worse. Continue antibiotics and close observation.  Docia Chuck. Geri Seminole., M.D. [facility] Division of Gastroenterology

## 2014-10-28 NOTE — Progress Notes (Signed)
Patient ID: Vincent Black, male   DOB: 30-Apr-1971, 44 y.o.   MRN: [id number]  [facility] PROGRESS NOTE  Vincent Black EQA:[id number] DOB: Dec 12, 1970 DOA: 10/26/2014 PCP: No PCP Per Patient   Brief narrative:    44 y.o. male with Crohn's disease, C. difficile colitis presented with abd pain, fever, chills, poor oral intake several days in duration.   Assessment/Plan:    Principal Problem:   Sepsis secondary to severe diverticulitis and intra abd abscess - criteria for sepsis met on admission with T 102.2, HR 110, BP 110/73, WBC 14.6 - placed on ABX: Cipro and Flagyl IV but due to extent of the illness, surgery recommended changing ABX to Zosyn, ID team agrees - oral vanc continued for recent C. Diff treatment - WBC is trending down since admission but unchanged over the past 24 hours: 14.6 --> 11.9 --> 11.8   Intra-abdominal abscess - appreciate IR following and assisting - IR placed a perc drain 3/18 that yielded 50cc of feculent material - patient likely has a fistulous connection from his colon to this abscess cavity - keep NPO for now  - ABX as noted above    Severe diverticulitis - ABX as noted above - keep NPO for now  Active Problems:   Recent C. Diff - continue oral vancomycin for now    Crohn's disease - appreciate GI team following    Normocytic anemia, secondary to Crohn's disease - appears that Hg at baseline typically ~8 - likely dilutional component from IVF pt has received - no signs of active bleeding - repeat CBC in AM   Hyponatremia - from pre renal etiology and dehydration in the setting of sepsis - continue IVF   Oral thrush - continue Fluconazole  - add magic mouthwash    Moderate PCM - in the context of acute illness - has to be NPO for now until abscess drained    Thrombocytosis - monitor   DVT prophylaxis - Heparin SQ  Code Status: Full.  Family Communication:  plan of care discussed with the patient Disposition Plan: Home when  stable.   IV access:  Peripheral IV  Procedures and diagnostic studies:    Ct Abdomen Pelvis W Contrast  10/26/2014    Severe colitis or diverticulitis involving the proximal sigmoid colon, with large pericolonic abscess containing stool measuring approximately 6 x 8 cm. Background  Mild diffuse colitis also noted, without other areas of colonic perforation or abscess.  No evidence of free intraperitoneal air.    Medical Consultants:  GI - Dr. Benson Norway IR ID Surgery   Other Consultants:  None  IAnti-Infectives:   Ciprofloxacin IV 3/17 --> 3/19 Metronidazole IV 3/17 --> 3/19 Zosyn 3/19 --> Vancomycin PO 3/17 --> Fluconazole IV 3/17 -->  Faye Ramsay, MD  [facility] Pager [phone]   If 7PM-7AM, please contact night-coverage [email or web address] Password [facility] 10/28/2014, 10:42 AM   LOS: 2 days   HPI/Subjective: No events overnight.   Objective: Filed Vitals:   10/28/14 0500 10/28/14 0701 10/28/14 0704 10/28/14 0800  BP:   141/96   Pulse:   123   Temp:   98.8 F (37.1 C)   TempSrc:   Oral   Resp:   15 16  Height:      Weight: 72.7 kg (160 lb 4.4 oz) 71.351 kg (157 lb 4.8 oz)    SpO2:   96% 95%    Intake/Output Summary (Last 24 hours) at 10/28/14 1042 Last data filed at 10/28/14 1027  Gross  per 24 hour  Intake    825 ml  Output    570 ml  Net    255 ml    Exam:   General:  Pt is alert, follows commands appropriately, not in acute distress  Cardiovascular: Regular rhythm, tachycardic, S1/S2, no murmurs, no rubs, no gallops  Respiratory: Clear to auscultation bilaterally, no wheezing, no crackles, no rhonchi  Abdomen: Soft, tender in epigastric area, non distended, bowel sounds present, no guarding  Extremities: No edema, pulses DP and PT palpable bilaterally  Neuro: Grossly nonfocal  Data Reviewed: Basic Metabolic Panel:  Recent Labs Lab 10/26/14 1825 10/27/14 0630 10/28/14 0243  NA 129* 134* 130*  K 3.7 4.0 3.5  CL 89* 98 98  CO2 _0 GLUCOSE  105* 83 84  BUN 11 9 <5*  CREATININE 0.99 0.89 0.88  CALCIUM 7.9* 8.1* 7.4*   Liver Function Tests:  Recent Labs Lab 10/26/14 1825 10/27/14 0630 10/28/14 0243  AST _1 ALT _2 ALKPHOS 81 68 65  BILITOT 0.8 0.3 0.5  PROT 6.1 5.3* 4.7*  ALBUMIN 2.0* 1.7* 1.5*   CBC:  Recent Labs Lab 10/26/14 1825 10/27/14 0630 10/28/14 0243  WBC 14.6* 11.9* 11.8*  HGB 9.0* 8.0* 7.8*  HCT 27.9* 25.0* 24.3*  MCV 92.7 92.9 92.4  PLT 782* 626* 704*   Scheduled Meds: . ciprofloxacin  400 mg Intravenous Q12H  . fluconazole  IV  100 mg Intravenous Q24H  . heparin  5,000 Units Subcutaneous 3 times per day  . metronidazole  500 mg Intravenous Q8H  . morphine  30 mg Oral Q12H  . vancomycin  125 mg Oral 4 times per day   Continuous Infusions: . sodium chloride 100 mL/hr at 10/27/14 1242

## 2014-10-28 NOTE — Progress Notes (Signed)
ANTIBIOTIC CONSULT NOTE - INITIAL  Pharmacy Consult for zosyn Indication: Intra-abdominal Infection  No Known Allergies  Patient Measurements: Height: 5\' 7"  (170.2 cm) Weight: 157 lb 4.8 oz (71.351 kg) IBW/kg (Calculated) : 66.1   Vital Signs: Temp: 98.8 F (37.1 C) (03/19 0704) Temp Source: Oral (03/19 0704) BP: 141/96 mmHg (03/19 0704) Pulse Rate: 123 (03/19 0704) Intake/Output from previous day: 03/18 0701 - 03/19 0700 In: 825 [I.V.:600; IV Piggyback:200] Out: 570 [Urine:520; Drains:50] Intake/Output from this shift:    Labs:  Recent Labs  10/26/14 1825 10/27/14 0630 10/28/14 0243  WBC 14.6* 11.9* 11.8*  HGB 9.0* 8.0* 7.8*  PLT 782* 626* 704*  CREATININE 0.99 0.89 0.88   Estimated Creatinine Clearance: 102.2 mL/min (by C-G formula based on Cr of 0.88). No results for input(s): VANCOTROUGH, VANCOPEAK, VANCORANDOM, GENTTROUGH, GENTPEAK, GENTRANDOM, TOBRATROUGH, TOBRAPEAK, TOBRARND, AMIKACINPEAK, AMIKACINTROU, AMIKACIN in the last 72 hours.   Microbiology: Recent Results (from the past 720 hour(s))  Clostridium Difficile by PCR     Status: Abnormal   Collection Time: 09/28/14  5:52 PM  Result Value Ref Range Status   C difficile by pcr POSITIVE (A) NEGATIVE Final    Comment: CRITICAL RESULT CALLED TO, READ BACK BY AND VERIFIED WITH: Mordecai Rasmussen RN 10:10 09/29/14 ([email or web address])   Culture, Urine     Status: None   Collection Time: 10/08/14  4:33 PM  Result Value Ref Range Status   Specimen Description URINE, CLEAN CATCH  Final   Special Requests Immunocompromised  Final   Colony Count NO GROWTH Performed at [facility]   Final   Culture NO GROWTH Performed at [facility]   Final   Report Status 10/10/2014 FINAL  Final  Culture, blood (routine x 2)     Status: None (Preliminary result)   Collection Time: 10/26/14 10:13 PM  Result Value Ref Range Status   Specimen Description BLOOD LEFT ARM  Final   Special Requests BOTTLES DRAWN AEROBIC AND  ANAEROBIC 5CC  Final   Culture   Final           BLOOD CULTURE RECEIVED NO GROWTH TO DATE CULTURE WILL BE HELD FOR 5 DAYS BEFORE ISSUING A FINAL NEGATIVE REPORT Performed at [facility]    Report Status PENDING  Incomplete  Culture, blood (routine x 2)     Status: None (Preliminary result)   Collection Time: 10/26/14 10:15 PM  Result Value Ref Range Status   Specimen Description BLOOD LEFT HAND  Final   Special Requests   Final    BOTTLES DRAWN AEROBIC AND ANAEROBIC [place] BLUE 3CC PURPLE   Culture   Final           BLOOD CULTURE RECEIVED NO GROWTH TO DATE CULTURE WILL BE HELD FOR 5 DAYS BEFORE ISSUING A FINAL NEGATIVE REPORT Performed at [facility]    Report Status PENDING  Incomplete    Medical History: Past Medical History  Diagnosis Date  . Crohn disease   . C. difficile colitis 09/30/2014  . Ankylosing spondylitis 09/30/2014  . Anxiety    Assessment: 46 YOM with Crohn's disease, hx of recurrent C-diff, was on cipro/flagyl/diflucan for severe diverticulitis and intra abd abscess, surgery recommended changing ABX to Zosyn. Tm 100.9, wbc sl elevated 11.8. Scr 0.88, est. crcl > 100 ml/min. Also noted on po vancomycin for f/u c-diff  Flucon 3/17>> Cipro 3/17>> 3/19 Flagyl 3/17>> 3/19 Zosyn 3/19 >> PO vanc 3/17 >>  3/17 Blood cx x 2  3/18  abscess -  3/18 C-diff pcr  Goal of Therapy:  Resolution of infection  Plan:  - Zosyn 3.375 g IV Q 8 hrs (4 hr infusion) - F/u cultures  Maryanna Shape, PharmD, BCPS  Clinical Pharmacist  Pager: [phone]   10/28/2014,12:53 PM

## 2014-10-28 NOTE — Progress Notes (Signed)
Patient ID: Vincent Black, male   DOB: 1971-01-02, 44 y.o.   MRN: [id number]    Subjective: Pt still having just as much pain today as yesterday.  On a soft diet, but hasn't eaten any of it yet.  Did have a loose BM last night  Objective: Vital signs in last 24 hours: Temp:  [98 F (36.7 C)-100.9 F (38.3 C)] 98.8 F (37.1 C) (03/19 0704) Pulse Rate:  [87-123] 123 (03/19 0704) Resp:  [8-23] 15 (03/19 0704) BP: (108-145)/(69-96) 141/96 mmHg (03/19 0704) SpO2:  [95 %-100 %] 96 % (03/19 0704) Weight:  [71.351 kg (157 lb 4.8 oz)-72.7 kg (160 lb 4.4 oz)] 71.351 kg (157 lb 4.8 oz) (03/19 0701) Last BM Date: 10/27/14  Intake/Output from previous day: 03/18 0701 - 03/19 0700 In: 825 [I.V.:600; IV Piggyback:200] Out: 570 [Urine:520; Drains:50] Intake/Output this shift:    PE: Abd: soft, but still very tender on the left side, greatest in LLQ, but quite tender in LUQ, +BS, ND, JP drain in place with minimal output right now as it was just emptied Heart: tachy Lungs: CTAB  Lab Results:   Recent Labs  10/27/14 0630 10/28/14 0243  WBC 11.9* 11.8*  HGB 8.0* 7.8*  HCT 25.0* 24.3*  PLT 626* 704*   BMET  Recent Labs  10/27/14 0630 10/28/14 0243  NA 134* 130*  K 4.0 3.5  CL 98 98  CO2 29 23  GLUCOSE 83 84  BUN 9 <5*  CREATININE 0.89 0.88  CALCIUM 8.1* 7.4*   PT/INR  Recent Labs  10/27/14 0630  LABPROT 16.9*  INR 1.36   CMP     Component Value Date/Time   NA 130* 10/28/2014 0243   K 3.5 10/28/2014 0243   CL 98 10/28/2014 0243   CO2 23 10/28/2014 0243   GLUCOSE 84 10/28/2014 0243   BUN <5* 10/28/2014 0243   CREATININE 0.88 10/28/2014 0243   CALCIUM 7.4* 10/28/2014 0243   PROT 4.7* 10/28/2014 0243   ALBUMIN 1.5* 10/28/2014 0243   AST 10 10/28/2014 0243   ALT 6 10/28/2014 0243   ALKPHOS 65 10/28/2014 0243   BILITOT 0.5 10/28/2014 0243   GFRNONAA >90 10/28/2014 0243   GFRAA >90 10/28/2014 0243   Lipase  No results found for:  LIPASE     Studies/Results: Ct Abdomen Pelvis W Contrast  10/26/2014   CLINICAL DATA:  Generalized abdominal pain. Nausea and vomiting. Fever. C difficile colitis.  EXAM: CT ABDOMEN AND PELVIS WITH CONTRAST  TECHNIQUE: Multidetector CT imaging of the abdomen and pelvis was performed using the standard protocol following bolus administration of intravenous contrast.  CONTRAST:  128mL OMNIPAQUE IOHEXOL 300 MG/ML  SOLN  COMPARISON:  07/11/2014  FINDINGS: Lower Chest:  Unremarkable.  Hepatobiliary: Mild hepatic steatosis. No liver masses are identified. Gallbladder is unremarkable. Tiny sub-cm cyst again seen in the inferior right hepatic lobe.  Pancreas: No mass, inflammatory changes, or other significant abnormality identified.  Spleen:  Within normal limits in size and appearance.  Adrenals:  No masses identified.  Kidneys/Urinary Tract:  No evidence of masses or hydronephrosis.  Stomach/Bowel/Peritoneum: Mild diffuse colonic wall thickening and mucosal enhancement is again demonstrated, consistent with diffuse colitis. However, there is a more cysts severe area of colonic wall thickening and pericolonic inflammatory change involving the proximal sigmoid colon with a large extraluminal collection containing gas stool and fluid which measures approximately 6.3 x 8.4 cm. This is consistent with a pericolonic abscess. There is no evidence of free fluid or  bowel obstruction. No evidence of pneumatosis or portal venous gas.  Vascular/Lymphatic: No pathologically enlarged lymph nodes identified. No other significant abnormality visualized.  Reproductive:  No mass or other significant abnormality identified.  Other:  None.  Musculoskeletal:  No suspicious bone lesions identified.  IMPRESSION: Severe colitis or diverticulitis involving the proximal sigmoid colon, with large pericolonic abscess containing stool measuring approximately 6 x 8 cm. Background  Mild diffuse colitis also noted, without other areas of colonic  perforation or abscess.  No evidence of free intraperitoneal air.  These results will be called to the ordering clinician or representative by the Radiologist Assistant, and communication documented in the PACS or zVision Dashboard.   Electronically Signed   By: Earle Gell M.D.   On: 10/26/2014 12:09   Ct Image Guided Drainage By Percutaneous Catheter  10/28/2014   CLINICAL DATA:  44 year old male with Crohn's disease and colonic perforation with large intraperitoneal abscess. CT-guided drain placement is warranted.  EXAM: CT IMAGE GUIDED DRAINAGE BY PERCUTANEOUS CATHETER  Date: 10/28/2014  PROCEDURE: 1. Placement of 21 French drainage catheter under CT guidance Interventional Radiologist:  Criselda Peaches, MD  ANESTHESIA/SEDATION: Moderate (conscious) sedation was used. 2 mg Versed, 1 mcg Fentanyl were administered intravenously. The patient's vital signs were monitored continuously by radiology nursing throughout the procedure.  Sedation Time: 12 minutes  MEDICATIONS: None additional  TECHNIQUE: Informed consent was obtained from the patient following explanation of the procedure, risks, benefits and alternatives. The patient understands, agrees and consents for the procedure. All questions were addressed. A time out was performed.  A planning axial CT scan was performed. The colonic perforation was localized. A suitable skin entry site was selected and marked. The region was then sterilely prepped and draped in standard fashion with Betadine skin prep. Local anesthesia was attained by infiltration with 1% lidocaine. Using intermittent CT fluoroscopic guidance, an 18 gauge trocar needle was advanced into the fluid collection. A 0.035 inch wire was then coiled within the fluid collection in the tract dilated to 14 [place]. A Cook 28 French drainage catheter was then advanced over the wire and formed within the fluid collection.  Aspiration yielded approximately 50 mL of frankly feculent material. A sample was  sent for culture. The catheter was then flushed and secured to the skin with 0 Prolene suture. The catheter was connected to JP bulb suction. Post drainage CT imaging demonstrates near-total aspiration of the abscess cavity. There is no evidence of complication.  COMPLICATIONS: None  IMPRESSION: 1. Successful placement of a 14 French drainage catheter into the pericolonic fluid and gas collection. Aspiration the yields 50 mL frankly feculent material.  PLAN: 1. Maintain tube to JP bulb drainage until leukocytosis has normalized and clinical picture has improved. After that, recommend conversion to gravity bag drainage to minimize the risk for fistula formation. 2. Given the feculent nature of the fluid and the underlying Crohn's disease, patient is at very high likelihood of developing a fistulous connection between the catheter and bowel. Drainage catheter will likely be in place for an extended period. Recommend contrast injection under fluoroscopy prior to drain removal. 3. Patient to be followed in IR drain clinic at 2 weeks post discharge. Signed,  Criselda Peaches, MD  [facility]  [facility]   Electronically Signed   By: [name] M.D.   On: 10/28/2014 08:47    Anti-infectives: Anti-infectives    Start     Dose/Rate Route Frequency Ordered Stop  10/26/14 2200  metroNIDAZOLE (FLAGYL) IVPB 500 mg     500 mg 100 mL/hr over 60 Minutes Intravenous Every 8 hours 10/26/14 2015     10/26/14 2100  ciprofloxacin (CIPRO) IVPB 400 mg     400 mg 200 mL/hr over 60 Minutes Intravenous Every 12 hours 10/26/14 2027     10/26/14 1900  fluconazole (DIFLUCAN) IVPB 100 mg     100 mg 50 mL/hr over 60 Minutes Intravenous Every 24 hours 10/26/14 1805     10/26/14 1800  vancomycin (VANCOCIN) 50 mg/mL oral solution 125 mg     125 mg Oral 4 times per day 10/26/14 1655         Assessment/Plan 1. Intra-abdominal abscess with crohn's disease and C diff  colitis -on Cipro/Flagyl right now, but would recommend this be switched to IV zosyn given the complexity of this disease process. -IR placed a perc drain yesterday that yielded 50cc of feculent material.  The patient likely h as a fistulous connection from his colon to this abscess cavity. -cont NPO for now, back diet down.  He needs at least today of bowel rest and possibly still a couple more days. -WBC stable.  Will follow closely.  If the patient does not improve with conservative management, he will likely require a Hartman's procedure.   LOS: 2 days    Kylia Grajales E 10/28/2014, 9:02 AM Pager: [phone]

## 2014-10-29 DIAGNOSIS — B9689 Other specified bacterial agents as the cause of diseases classified elsewhere: Secondary | ICD-10-CM

## 2014-10-29 DIAGNOSIS — Z8619 Personal history of other infectious and parasitic diseases: Secondary | ICD-10-CM

## 2014-10-29 DIAGNOSIS — R197 Diarrhea, unspecified: Secondary | ICD-10-CM

## 2014-10-29 LAB — CBC
HCT: 26.4 % — ABNORMAL LOW (ref 39.0–52.0)
HEMOGLOBIN: 8.5 g/dL — AB (ref 13.0–17.0)
MCH: 29.9 pg (ref 26.0–34.0)
MCHC: 32.2 g/dL (ref 30.0–36.0)
MCV: 93 fL (ref 78.0–100.0)
Platelets: 743 10*3/uL — ABNORMAL HIGH (ref 150–400)
RBC: 2.84 MIL/uL — ABNORMAL LOW (ref 4.22–5.81)
RDW: 16.3 % — ABNORMAL HIGH (ref 11.5–15.5)
WBC: 12.5 10*3/uL — ABNORMAL HIGH (ref 4.0–10.5)

## 2014-10-29 LAB — BASIC METABOLIC PANEL
Anion gap: 9 (ref 5–15)
BUN: <5 mg/dL — ABNORMAL LOW (ref 6–23)
CALCIUM: 7.6 mg/dL — AB (ref 8.4–10.5)
CO2: 26 mmol/L (ref 19–32)
Chloride: 94 mmol/L — ABNORMAL LOW (ref 96–112)
Creatinine, Ser: 0.82 mg/dL (ref 0.50–1.35)
GFR calc Af Amer: >90 mL/min (ref >90)
Glucose, Bld: 85 mg/dL (ref 70–99)
Potassium: 3.2 mmol/L — ABNORMAL LOW (ref 3.5–5.1)
Sodium: 129 mmol/L — ABNORMAL LOW (ref 135–145)

## 2014-10-29 LAB — MAGNESIUM: Magnesium: 1.8 mg/dL (ref 1.5–2.5)

## 2014-10-29 LAB — CLOSTRIDIUM DIFFICILE BY PCR: CDIFFPCR: POSITIVE — AB

## 2014-10-29 MED ORDER — CHLORHEXIDINE GLUCONATE 0.12 % MT SOLN
15.0000 mL | Freq: Two times a day (BID) | OROMUCOSAL | Status: DC
  Administered 2014-10-29 – 2014-11-03 (×11): 15 mL via OROMUCOSAL
  Filled 2014-10-29 (×15): qty 15

## 2014-10-29 MED ORDER — CETYLPYRIDINIUM CHLORIDE 0.05 % MT LIQD
7.0000 mL | Freq: Two times a day (BID) | OROMUCOSAL | Status: DC
  Administered 2014-10-29 – 2014-11-03 (×8): 7 mL via OROMUCOSAL

## 2014-10-29 MED ORDER — SODIUM CHLORIDE 0.9 % IV SOLN
INTRAVENOUS | Status: DC
  Administered 2014-10-29 – 2014-10-30 (×3): via INTRAVENOUS
  Filled 2014-10-29 (×5): qty 1000

## 2014-10-29 NOTE — Progress Notes (Signed)
[facility] Progress Note  Subjective:  Still with a lot of pain, but it is slightly improved from yesterday.  Still NPO with just ice chips per surgery.  JP drain still with minimal output; says that they got 70-80 cc's out initially, but not much since then.  Has been started on diflucan and magic mouthwash empirically for thrush/candidiasis.  Had another small loose stool this AM so study sent for Cdiff PCR.  Objective:  Vital signs in last 24 hours: Temp:  [98.3 F (36.8 C)-99.3 F (37.4 C)] 98.3 F (36.8 C) (03/20 0527) Pulse Rate:  [111-116] 111 (03/19 2316) Resp:  [14-19] 16 (03/20 0527) BP: (126-133)/(87-95) 126/90 mmHg (03/20 0527) SpO2:  [91 %-96 %] 96 % (03/20 0527) Weight:  [155 lb 6.8 oz (70.5 kg)] 155 lb 6.8 oz (70.5 kg) (03/20 0623) Last BM Date: 10/27/14 General:  Alert, Well-developed, in NAD Heart:  Tachy but regular.  No M/R/G. Pulm:  CTAB.  No W/R/R. Abdomen:  Soft, slightly distended.  BS present.  Still quite tender specifically in LLQ.  Drain with minimal output. Extremities:  Without edema. Neurologic:  Alert and  oriented x4;  grossly normal neurologically. Psych:  Alert and cooperative. Normal mood and affect.  Intake/Output from previous day: 03/19 0701 - 03/20 0700 In: 160 [IV Piggyback:150] Out: 200 [Urine:200]  Lab Results:  Recent Labs  10/27/14 0630 10/28/14 0243 10/29/14 0605  WBC 11.9* 11.8* 12.5*  HGB 8.0* 7.8* 8.5*  HCT 25.0* 24.3* 26.4*  PLT 626* 704* 743*   BMET  Recent Labs  10/27/14 0630 10/28/14 0243 10/29/14 0605  NA 134* 130* 129*  K 4.0 3.5 3.2*  CL 98 98 94*  CO2 29 23 26   GLUCOSE 83 84 85  BUN 9 <5* <5*  CREATININE 0.89 0.88 0.82  CALCIUM 8.1* 7.4* 7.6*   LFT  Recent Labs  10/28/14 0243  PROT 4.7*  ALBUMIN 1.5*  AST 10  ALT 6  ALKPHOS 65  BILITOT 0.5   PT/INR  Recent Labs  10/27/14 0630  LABPROT 16.9*  INR 1.36   Ct Image Guided Drainage By Percutaneous Catheter  10/28/2014    CLINICAL DATA:  44 year old male with Crohn's disease and colonic perforation with large intraperitoneal abscess. CT-guided drain placement is warranted.  EXAM: CT IMAGE GUIDED DRAINAGE BY PERCUTANEOUS CATHETER  Date: 10/28/2014  PROCEDURE: 1. Placement of 58 French drainage catheter under CT guidance Interventional Radiologist:  Criselda Peaches, MD  ANESTHESIA/SEDATION: Moderate (conscious) sedation was used. 2 mg Versed, 1 mcg Fentanyl were administered intravenously. The patient's vital signs were monitored continuously by radiology nursing throughout the procedure.  Sedation Time: 12 minutes  MEDICATIONS: None additional  TECHNIQUE: Informed consent was obtained from the patient following explanation of the procedure, risks, benefits and alternatives. The patient understands, agrees and consents for the procedure. All questions were addressed. A time out was performed.  A planning axial CT scan was performed. The colonic perforation was localized. A suitable skin entry site was selected and marked. The region was then sterilely prepped and draped in standard fashion with Betadine skin prep. Local anesthesia was attained by infiltration with 1% lidocaine. Using intermittent CT fluoroscopic guidance, an 18 gauge trocar needle was advanced into the fluid collection. A 0.035 inch wire was then coiled within the fluid collection in the tract dilated to 14 [place]. A Cook 4 French drainage catheter was then advanced over the wire and formed within the fluid collection.  Aspiration yielded approximately  50 mL of frankly feculent material. A sample was sent for culture. The catheter was then flushed and secured to the skin with 0 Prolene suture. The catheter was connected to JP bulb suction. Post drainage CT imaging demonstrates near-total aspiration of the abscess cavity. There is no evidence of complication.  COMPLICATIONS: None  IMPRESSION: 1. Successful placement of a 14 French drainage catheter into the  pericolonic fluid and gas collection. Aspiration the yields 50 mL frankly feculent material.  PLAN: 1. Maintain tube to JP bulb drainage until leukocytosis has normalized and clinical picture has improved. After that, recommend conversion to gravity bag drainage to minimize the risk for fistula formation. 2. Given the feculent nature of the fluid and the underlying Crohn's disease, patient is at very high likelihood of developing a fistulous connection between the catheter and bowel. Drainage catheter will likely be in place for an extended period. Recommend contrast injection under fluoroscopy prior to drain removal. 3. Patient to be followed in IR drain clinic at 2 weeks post discharge. Signed,  Criselda Peaches, MD  [facility]  [facility]   Electronically Signed   By: [name] M.D.   On: 10/28/2014 08:47   Assessment / Plan: 1) Sigmoid colon abscess s/p placement of 40F drain into LLQ on 3/18. IR noted that there is extremely high likelihood of fistula to colon given large rupture site. Management of drain per IR. On IV Zosyn (switched from Cipro and Flagyl today). Surgery is following as well. Continue NPO with ice chips only for now per surgery. 2) Crohn's disease: Had been on Humira. 3) Recent C. Diff infection: Placed back on PO vancomycin 125 mg four times daily per ID to prevent relapse. Had a small loose stool again this AM so stool sent for Cdiff PCR.  *Dr. Benson Norway to resume GI care tomorrow, 3/21. *Left message with IR to follow-up with patient possibly tomorrow to determine why there is no further output in drain (all drained, clogged, needs to be moved/advanced, etc).   LOS: 3 days   ZEHR, JESSICA D.  10/29/2014, 9:09 AM  Pager number [phone]  GI ATTENDING (covering for Dr. Benson Norway)  Interval history data reviewed. Patient personally seen and examined. Family in room. Agree with interval progress note as outlined above.  The patient is feeling better with less pain. Looks better. Still with tenderness on exam. JP drain output insignificant. Agree with continuing antibiotics. Interventional radiology to reassess status of drain/abscess. Dr. Benson Norway to resume GI care tomorrow.  Docia Chuck. Geri [place]., M.D. [facility] Division of Gastroenterology

## 2014-10-29 NOTE — Progress Notes (Signed)
[facility] for Infectious Disease    Date of Admission:  10/26/2014   Total days of antibiotics 3        Day 3 cipro, metro, fluc, oral vanco           ID: Vincent Black is a 44 y.o. male with  Crohn's disease, ankylosis spondylitis, with hx of  recurrent cdifficile entiriti now presents with large pericolonic abscess s/p IR drain placement on 3/18 Principal Problem:   Pericolonic abscess Active Problems:   Crohn's disease   History of Clostridium difficile colitis   Protein-calorie malnutrition, severe   Ankylosing spondylitis   Unintentional weight loss   Normocytic anemia   Odynophagia   Left lower quadrant pain    Subjective:  Fever curve is trending down, currently afebrile. Had one loose stool yesterday as well as this morning. Still having abd pain. Very little output from drain since insertion  Medications:  . antiseptic oral rinse  7 mL Mouth Rinse q12n4p  . chlorhexidine  15 mL Mouth Rinse BID  . fluconazole (DIFLUCAN) IV  100 mg Intravenous Q24H  . heparin  5,000 Units Subcutaneous 3 times per day  . HYDROmorphone PCA 0.3 mg/mL   Intravenous 6 times per day  . magic mouthwash  10 mL Oral QID  . morphine  30 mg Oral Q12H  . piperacillin-tazobactam (ZOSYN)  IV  3.375 g Intravenous Q8H  . vancomycin  125 mg Oral 4 times per day    Objective: Vital signs in last 24 hours: Temp:  [98.3 F (36.8 C)-99.3 F (37.4 C)] 98.3 F (36.8 C) (03/20 0527) Pulse Rate:  [111-116] 111 (03/19 2316) Resp:  [14-19] 17 (03/20 0730) BP: (126-133)/(87-95) 126/90 mmHg (03/20 0527) SpO2:  [91 %-96 %] 95 % (03/20 0730) Weight:  [155 lb 6.8 oz (70.5 kg)] 155 lb 6.8 oz (70.5 kg) (03/20 1610) Physical Exam  Constitutional: He is oriented to person, place, and time. He appears well-developed and well-nourished. No distress.  HENT:  Mouth/Throat: Oropharynx is clear and moist. No oropharyngeal exudate.  Cardiovascular: Normal rate, regular rhythm and normal heart sounds. Exam  reveals no gallop and no friction rub.  No murmur heard.  Pulmonary/Chest: Effort normal and breath sounds normal. No respiratory distress. He has no wheezes.  Abdominal: Soft. Bowel sounds are decreased. No distention. Some tenderness with palpation but no guarding Skin: Skin is warm and dry. No rash noted. No erythema.  Lab Results  Recent Labs  10/28/14 0243 10/29/14 0605  WBC 11.8* 12.5*  HGB 7.8* 8.5*  HCT 24.3* 26.4*  NA 130* 129*  K 3.5 3.2*  CL 98 94*  CO2 23 26  BUN <5* <5*  CREATININE 0.88 0.82   Liver Panel  Recent Labs  10/27/14 0630 10/28/14 0243  PROT 5.3* 4.7*  ALBUMIN 1.7* 1.5*  AST 10 10  ALT 7 6  ALKPHOS 68 65  BILITOT 0.3 0.5   Sedimentation Rate  Recent Labs  10/27/14 0630  ESRSEDRATE 112*   C-Reactive Protein  Recent Labs  10/27/14 0630  CRP 24.4*    Microbiology: 3/18 aspirate fluid reincubating 3/17 blood cx ngtd Studies/Results: Ct Image Guided Drainage By Percutaneous Catheter  10/28/2014   CLINICAL DATA:  44 year old male with Crohn's disease and colonic perforation with large intraperitoneal abscess. CT-guided drain placement is warranted.  EXAM: CT IMAGE GUIDED DRAINAGE BY PERCUTANEOUS CATHETER  Date: 10/28/2014  PROCEDURE: 1. Placement of 14 French drainage catheter under CT guidance Interventional Radiologist:  Vincent Sheng  K. Laurence Ferrari, MD  ANESTHESIA/SEDATION: Moderate (conscious) sedation was used. 2 mg Versed, 1 mcg Fentanyl were administered intravenously. The patient's vital signs were monitored continuously by radiology nursing throughout the procedure.  Sedation Time: 12 minutes  MEDICATIONS: None additional  TECHNIQUE: Informed consent was obtained from the patient following explanation of the procedure, risks, benefits and alternatives. The patient understands, agrees and consents for the procedure. All questions were addressed. A time out was performed.  A planning axial CT scan was performed. The colonic perforation was  localized. A suitable skin entry site was selected and marked. The region was then sterilely prepped and draped in standard fashion with Betadine skin prep. Local anesthesia was attained by infiltration with 1% lidocaine. Using intermittent CT fluoroscopic guidance, an 18 gauge trocar needle was advanced into the fluid collection. A 0.035 inch wire was then coiled within the fluid collection in the tract dilated to 14 [place]. A Cook 55 French drainage catheter was then advanced over the wire and formed within the fluid collection.  Aspiration yielded approximately 50 mL of frankly feculent material. A sample was sent for culture. The catheter was then flushed and secured to the skin with 0 Prolene suture. The catheter was connected to JP bulb suction. Post drainage CT imaging demonstrates near-total aspiration of the abscess cavity. There is no evidence of complication.  COMPLICATIONS: None  IMPRESSION: 1. Successful placement of a 14 French drainage catheter into the pericolonic fluid and gas collection. Aspiration the yields 50 mL frankly feculent material.  PLAN: 1. Maintain tube to JP bulb drainage until leukocytosis has normalized and clinical picture has improved. After that, recommend conversion to gravity bag drainage to minimize the risk for fistula formation. 2. Given the feculent nature of the fluid and the underlying Crohn's disease, patient is at very high likelihood of developing a fistulous connection between the catheter and bowel. Drainage catheter will likely be in place for an extended period. Recommend contrast injection under fluoroscopy prior to drain removal. 3. Patient to be followed in IR drain clinic at 2 weeks post discharge. Signed,  Vincent Peaches, MD  [facility]  [facility]   Electronically Signed   By: Vincent Black M.D.   On: 10/28/2014 08:47     Assessment/Plan: Intra-abdominal abscess = continue on piptazo for now until  we have cx results to help target antibiotics. Defer to surgery for need of resection/hartmann's pouch  Loose stool =  makes sense to test since he is high risk for relapse.  cdiff testing pending, if negative, would d/c oral vanco.  Hx of cdifficile = he is at risk for relapse due to re exposure to antibiotics, but somewhat unavoidable given his intra-abd abscess  [facility], [facility] for Infectious Diseases Cell: [phone] Pager: [phone]  10/29/2014, 11:20 AM

## 2014-10-29 NOTE — Progress Notes (Addendum)
Patient ID: Vincent Black, male   DOB: 10-21-1970, 44 y.o.   MRN: [id number]  [facility] PROGRESS NOTE  Vincent Black TKP:[id number] DOB: 1971-02-27 DOA: 10/26/2014 PCP: No PCP Per Patient   Brief narrative:    44 y.o. male with Crohn's disease, C. difficile colitis presented with abd pain, fever, chills, poor oral intake several days in duration.   Assessment/Plan:    Principal Problem:   Sepsis secondary to severe diverticulitis and intra abd abscess - criteria for sepsis met on admission with T 102.2, HR 110, BP 110/73, WBC 14.6 - placed on ABX: Cipro and Flagyl IV but due to extent of the illness, surgery recommended changing ABX to Zosyn 3/19 - pt reports feeling better this AM - oral vanc continued for recent C. Diff treatment - WBC is trending down since admission: 14.6 --> 11.9 --> 11.8 --> 12.8 - follow up on surgery recommendations    Intra-abdominal abscess - appreciate IR following and assisting - IR placed a perc drain 3/18 that yielded 50cc of feculent material - patient likely has a fistulous connection from his colon to this abscess cavity - ABX as noted above    Severe diverticulitis - ABX as noted above Active Problems:   Recent C. Diff - continue oral vancomycin for now  - C. Diff pending    Crohn's disease - appreciate GI team following    Normocytic anemia, secondary to Crohn's disease - appears that Hg at baseline typically ~8 - likely dilutional component from IVF pt has received - no signs of active bleeding - repeat CBC in AM   Hyponatremia - from pre renal etiology and dehydration in the setting of sepsis - continue IVF   Oral thrush - continue Fluconazole  - added magic mouthwash and pt reports he has less odynophagia    Moderate PCM - in the context of acute illness - has to be NPO for now until abscess drained    Hypokalemia - keep K > 4 - supplement via IV - check Mg level as well    Thrombocytosis - monitor   DVT  prophylaxis - Heparin SQ  Code Status: Full.  Family Communication:  plan of care discussed with the patient and wife at bedside  Disposition Plan: Home when stable.   IV access:  Peripheral IV  Procedures and diagnostic studies:    Ct Abdomen Pelvis W Contrast  10/26/2014    Severe colitis or diverticulitis involving the proximal sigmoid colon, with large pericolonic abscess containing stool measuring approximately 6 x 8 cm. Background  Mild diffuse colitis also noted, without other areas of colonic perforation or abscess.  No evidence of free intraperitoneal air.    Medical Consultants:  GI - Dr. Benson Norway IR ID Surgery   Other Consultants:  None  IAnti-Infectives:   Ciprofloxacin IV 3/17 --> 3/19 Metronidazole IV 3/17 --> 3/19 Zosyn 3/19 --> Vancomycin PO 3/17 --> Fluconazole IV 3/17 -->  Faye Ramsay, MD  [facility] Pager [phone]   If 7PM-7AM, please contact night-coverage [email or web address] Password [email or web address] 10/29/2014, 1:29 PM   LOS: 3 days   HPI/Subjective: No events overnight.   Objective: Filed Vitals:   10/29/14 0730 10/29/14 1130 10/29/14 1308 10/29/14 1315  BP:    141/92  Pulse:    95  Temp:    98.7 F (37.1 C)  TempSrc:    Oral  Resp: 17 16 17 18   Height:      Weight:      SpO2: 95% 95%  96% 97%    Intake/Output Summary (Last 24 hours) at 10/29/14 1329 Last data filed at 10/29/14 1154  Gross per 24 hour  Intake    205 ml  Output    200 ml  Net      5 ml    Exam:   General:  Pt is alert, follows commands appropriately, not in acute distress  Cardiovascular: Regular rhythm, tachycardic, S1/S2, no murmurs, no rubs, no gallops  Respiratory: Clear to auscultation bilaterally, no wheezing, no crackles, no rhonchi  Abdomen: Soft, tender in epigastric area, non distended, bowel sounds present, no guarding  Extremities: No edema, pulses DP and PT palpable bilaterally  Neuro: Grossly nonfocal  Data Reviewed: Basic Metabolic Panel:  Recent Labs Lab  10/26/14 1825 10/27/14 0630 10/28/14 0243 10/29/14 0605  NA 129* 134* 130* 129*  K 3.7 4.0 3.5 3.2*  CL 89* 98 98 94*  CO2 30 29 23 26   GLUCOSE 105* 83 84 85  BUN 11 9 <5* <5*  CREATININE 0.99 0.89 0.88 0.82  CALCIUM 7.9* 8.1* 7.4* 7.6*   Liver Function Tests:  Recent Labs Lab 10/26/14 1825 10/27/14 0630 10/28/14 0243  AST 17 10 10   ALT 9 7 6   ALKPHOS 81 68 65  BILITOT 0.8 0.3 0.5  PROT 6.1 5.3* 4.7*  ALBUMIN 2.0* 1.7* 1.5*   CBC:  Recent Labs Lab 10/26/14 1825 10/27/14 0630 10/28/14 0243 10/29/14 0605  WBC 14.6* 11.9* 11.8* 12.5*  HGB 9.0* 8.0* 7.8* 8.5*  HCT 27.9* 25.0* 24.3* 26.4*  MCV 92.7 92.9 92.4 93.0  PLT 782* 626* 704* 743*   Scheduled Meds: . fluconazole (DIFLUCAN) IV  100 mg Intravenous Q24H  . heparin  5,000 Units Subcutaneous 3 times per day  . magic mouthwash  10 mL Oral QID  . morphine  30 mg Oral Q12H  . piperacillin-tazobactam (ZOSYN)  IV  3.375 g Intravenous Q8H  . vancomycin  125 mg Oral 4 times per day    Continuous Infusions: . sodium chloride 100 mL/hr at 10/28/14 1251

## 2014-10-29 NOTE — Progress Notes (Signed)
Patient ID: Vincent Black, male   DOB: 1970-12-17, 44 y.o.   MRN: [id number]    Subjective: Pt feels a little better today.  No nausea  Objective: Vital signs in last 24 hours: Temp:  [98.3 F (36.8 C)-99.3 F (37.4 C)] 98.3 F (36.8 C) (03/20 0527) Pulse Rate:  [111-116] 111 (03/19 2316) Resp:  [14-19] 16 (03/20 0527) BP: (126-133)/(87-95) 126/90 mmHg (03/20 0527) SpO2:  [91 %-96 %] 96 % (03/20 0527) Weight:  [70.5 kg (155 lb 6.8 oz)] 70.5 kg (155 lb 6.8 oz) (03/20 4098) Last BM Date: 10/27/14  Intake/Output from previous day: 03/19 0701 - 03/20 0700 In: 160 [IV Piggyback:150] Out: 200 [Urine:200] Intake/Output this shift:    PE: Abd: soft, but some distention noted today, (feels a little bloated), some BS, less tender, but still quite tender specifically in the LLQ.  Drain with minimal drainage, but what is present appears to be and smells feculent. Heart: tachy Lungs: CTAB  Lab Results:   Recent Labs  10/28/14 0243 10/29/14 0605  WBC 11.8* 12.5*  HGB 7.8* 8.5*  HCT 24.3* 26.4*  PLT 704* 743*   BMET  Recent Labs  10/28/14 0243 10/29/14 0605  NA 130* 129*  K 3.5 3.2*  CL 98 94*  CO2 23 26  GLUCOSE 84 85  BUN <5* <5*  CREATININE 0.88 0.82  CALCIUM 7.4* 7.6*   PT/INR  Recent Labs  10/27/14 0630  LABPROT 16.9*  INR 1.36   CMP     Component Value Date/Time   NA 129* 10/29/2014 0605   K 3.2* 10/29/2014 0605   CL 94* 10/29/2014 0605   CO2 26 10/29/2014 0605   GLUCOSE 85 10/29/2014 0605   BUN <5* 10/29/2014 0605   CREATININE 0.82 10/29/2014 0605   CALCIUM 7.6* 10/29/2014 0605   PROT 4.7* 10/28/2014 0243   ALBUMIN 1.5* 10/28/2014 0243   AST 10 10/28/2014 0243   ALT 6 10/28/2014 0243   ALKPHOS 65 10/28/2014 0243   BILITOT 0.5 10/28/2014 0243   GFRNONAA >90 10/29/2014 0605   GFRAA >90 10/29/2014 0605   Lipase  No results found for: LIPASE     Studies/Results: Ct Image Guided Drainage By Percutaneous Catheter  10/28/2014   CLINICAL  DATA:  44 year old male with Crohn's disease and colonic perforation with large intraperitoneal abscess. CT-guided drain placement is warranted.  EXAM: CT IMAGE GUIDED DRAINAGE BY PERCUTANEOUS CATHETER  Date: 10/28/2014  PROCEDURE: 1. Placement of 78 French drainage catheter under CT guidance Interventional Radiologist:  Criselda Peaches, MD  ANESTHESIA/SEDATION: Moderate (conscious) sedation was used. 2 mg Versed, 1 mcg Fentanyl were administered intravenously. The patient's vital signs were monitored continuously by radiology nursing throughout the procedure.  Sedation Time: 12 minutes  MEDICATIONS: None additional  TECHNIQUE: Informed consent was obtained from the patient following explanation of the procedure, risks, benefits and alternatives. The patient understands, agrees and consents for the procedure. All questions were addressed. A time out was performed.  A planning axial CT scan was performed. The colonic perforation was localized. A suitable skin entry site was selected and marked. The region was then sterilely prepped and draped in standard fashion with Betadine skin prep. Local anesthesia was attained by infiltration with 1% lidocaine. Using intermittent CT fluoroscopic guidance, an 18 gauge trocar needle was advanced into the fluid collection. A 0.035 inch wire was then coiled within the fluid collection in the tract dilated to 14 [place]. A Cook 34 French drainage catheter was then advanced over the  wire and formed within the fluid collection.  Aspiration yielded approximately 50 mL of frankly feculent material. A sample was sent for culture. The catheter was then flushed and secured to the skin with 0 Prolene suture. The catheter was connected to JP bulb suction. Post drainage CT imaging demonstrates near-total aspiration of the abscess cavity. There is no evidence of complication.  COMPLICATIONS: None  IMPRESSION: 1. Successful placement of a 14 French drainage catheter into the pericolonic fluid  and gas collection. Aspiration the yields 50 mL frankly feculent material.  PLAN: 1. Maintain tube to JP bulb drainage until leukocytosis has normalized and clinical picture has improved. After that, recommend conversion to gravity bag drainage to minimize the risk for fistula formation. 2. Given the feculent nature of the fluid and the underlying Crohn's disease, patient is at very high likelihood of developing a fistulous connection between the catheter and bowel. Drainage catheter will likely be in place for an extended period. Recommend contrast injection under fluoroscopy prior to drain removal. 3. Patient to be followed in IR drain clinic at 2 weeks post discharge. Signed,  Criselda Peaches, MD  [facility]  [facility] Radiology   Electronically Signed   By: [name] M.D.   On: 10/28/2014 08:47    Anti-infectives: Anti-infectives    Start     Dose/Rate Route Frequency Ordered Stop   10/28/14 1200  piperacillin-tazobactam (ZOSYN) IVPB 3.375 g     3.375 g 12.5 mL/hr over 240 Minutes Intravenous Every 8 hours 10/28/14 1021     10/26/14 2200  metroNIDAZOLE (FLAGYL) IVPB 500 mg  Status:  Discontinued     500 mg 100 mL/hr over 60 Minutes Intravenous Every 8 hours 10/26/14 2015 10/28/14 0943   10/26/14 2100  ciprofloxacin (CIPRO) IVPB 400 mg  Status:  Discontinued     400 mg 200 mL/hr over 60 Minutes Intravenous Every 12 hours 10/26/14 2027 10/28/14 1021   10/26/14 1900  fluconazole (DIFLUCAN) IVPB 100 mg     100 mg 50 mL/hr over 60 Minutes Intravenous Every 24 hours 10/26/14 1805     10/26/14 1800  vancomycin (VANCOCIN) 50 mg/mL oral solution 125 mg     125 mg Oral 4 times per day 10/26/14 1655         Assessment/Plan   1. Intra-abdominal abscess with crohn's disease and C diff colitis -patient now on Zosyn along with oral vancomycin -IR placed a perc drain yesterday that yielded 50cc of feculent material. The patient likely has a  fistulous connection from his colon to this abscess cavity, although he currently has minimal output, but it is feculent.  CXs are pending -cont NPO for now.he may need bowel rest still for a couple more days -WBC up slightly to 12.5 Will follow closely. If the patient does not improve with conservative management, he will likely require a Hartman's procedure. Hypoalbuminemia -albumin is 1.5.  Patient states he was eating great prior to admission; however, this is very low for a 44 yo male.  I will check a prealbumin on him today.  If this is low and there is some concern for multiple days of NPO vs potentially surgery, he may need some nutritional support with TNA.  LOS: 3 days    Marlana Mckowen E 10/29/2014, 9:20 AM Pager: [phone]

## 2014-10-30 DIAGNOSIS — Z8719 Personal history of other diseases of the digestive system: Secondary | ICD-10-CM

## 2014-10-30 LAB — CBC
HEMATOCRIT: 25 % — AB (ref 39.0–52.0)
Hemoglobin: 8 g/dL — ABNORMAL LOW (ref 13.0–17.0)
MCH: 29.6 pg (ref 26.0–34.0)
MCHC: 32 g/dL (ref 30.0–36.0)
MCV: 92.6 fL (ref 78.0–100.0)
PLATELETS: 752 10*3/uL — AB (ref 150–400)
RBC: 2.7 MIL/uL — ABNORMAL LOW (ref 4.22–5.81)
RDW: 16.4 % — AB (ref 11.5–15.5)
WBC: 10.7 10*3/uL — AB (ref 4.0–10.5)

## 2014-10-30 LAB — BASIC METABOLIC PANEL
Anion gap: 8 (ref 5–15)
BUN: <5 mg/dL — ABNORMAL LOW (ref 6–23)
CO2: 28 mmol/L (ref 19–32)
Calcium: 7.6 mg/dL — ABNORMAL LOW (ref 8.4–10.5)
Chloride: 95 mmol/L — ABNORMAL LOW (ref 96–112)
Creatinine, Ser: 0.81 mg/dL (ref 0.50–1.35)
GFR calc Af Amer: >90 mL/min (ref >90)
GFR calc non Af Amer: >90 mL/min (ref >90)
GLUCOSE: 91 mg/dL (ref 70–99)
POTASSIUM: 3.8 mmol/L (ref 3.5–5.1)
SODIUM: 131 mmol/L — AB (ref 135–145)

## 2014-10-30 LAB — PREALBUMIN: Prealbumin: 4.3 mg/dL — ABNORMAL LOW (ref 17.0–34.0)

## 2014-10-30 MED ORDER — MAGNESIUM SULFATE 2 GM/50ML IV SOLN
2.0000 g | Freq: Once | INTRAVENOUS | Status: AC
  Administered 2014-10-30: 2 g via INTRAVENOUS
  Filled 2014-10-30: qty 50

## 2014-10-30 MED ORDER — SODIUM CHLORIDE 0.9 % IV SOLN
INTRAVENOUS | Status: AC
  Administered 2014-10-30: 13:00:00 via INTRAVENOUS
  Filled 2014-10-30 (×4): qty 1000

## 2014-10-30 NOTE — Progress Notes (Signed)
ANTIBIOTIC CONSULT NOTE - Follow-up  Pharmacy Consult for zosyn Indication: Intra-abdominal Infection  No Known Allergies  Patient Measurements: Height: 5\' 7"  (170.2 cm) Weight: 165 lb 5.5 oz (75 kg) IBW/kg (Calculated) : 66.1   Vital Signs: Temp: 98.8 F (37.1 C) (03/21 0558) Temp Source: Oral (03/21 0558) BP: 122/91 mmHg (03/21 0558) Pulse Rate: 108 (03/21 0558) Intake/Output from previous day: 03/20 0701 - 03/21 0700 In: 1410 [I.V.:1260; IV Piggyback:150] Out: 550 [Urine:550] Intake/Output from this shift: Total I/O In: 531.7 [I.V.:526.7; Other:5] Out: 3 [Drains:3]  Labs:  Recent Labs  10/28/14 0243 10/29/14 0605 10/30/14 0615  WBC 11.8* 12.5* 10.7*  HGB 7.8* 8.5* 8.0*  PLT 704* 743* 752*  CREATININE 0.88 0.82 0.81   Estimated Creatinine Clearance: 111.1 mL/min (by C-G formula based on Cr of 0.81). No results for input(s): VANCOTROUGH, VANCOPEAK, VANCORANDOM, GENTTROUGH, GENTPEAK, GENTRANDOM, TOBRATROUGH, TOBRAPEAK, TOBRARND, AMIKACINPEAK, AMIKACINTROU, AMIKACIN in the last 72 hours.   Microbiology: Recent Results (from the past 720 hour(s))  Culture, Urine     Status: None   Collection Time: 10/08/14  4:33 PM  Result Value Ref Range Status   Specimen Description URINE, CLEAN CATCH  Final   Special Requests Immunocompromised  Final   Colony Count NO GROWTH Performed at [facility]   Final   Culture NO GROWTH Performed at [facility]   Final   Report Status 10/10/2014 FINAL  Final  Culture, blood (routine x 2)     Status: None (Preliminary result)   Collection Time: 10/26/14 10:13 PM  Result Value Ref Range Status   Specimen Description BLOOD LEFT ARM  Final   Special Requests BOTTLES DRAWN AEROBIC AND ANAEROBIC 5CC  Final   Culture   Final           BLOOD CULTURE RECEIVED NO GROWTH TO DATE CULTURE WILL BE HELD FOR 5 DAYS BEFORE ISSUING A FINAL NEGATIVE REPORT Performed at [facility]    Report Status PENDING  Incomplete   Culture, blood (routine x 2)     Status: None (Preliminary result)   Collection Time: 10/26/14 10:15 PM  Result Value Ref Range Status   Specimen Description BLOOD LEFT HAND  Final   Special Requests   Final    BOTTLES DRAWN AEROBIC AND ANAEROBIC [place] BLUE 3CC PURPLE   Culture   Final           BLOOD CULTURE RECEIVED NO GROWTH TO DATE CULTURE WILL BE HELD FOR 5 DAYS BEFORE ISSUING A FINAL NEGATIVE REPORT Performed at [facility]    Report Status PENDING  Incomplete  Anaerobic culture     Status: None (Preliminary result)   Collection Time: 10/27/14  5:56 PM  Result Value Ref Range Status   Specimen Description ABSCESS ABDOMEN  Final   Special Requests NONE  Final   Gram Stain PENDING  Incomplete   Culture   Final    NO ANAEROBES ISOLATED; CULTURE IN PROGRESS FOR 5 DAYS Performed at [facility]    Report Status PENDING  Incomplete  Culture, routine-abscess     Status: None (Preliminary result)   Collection Time: 10/27/14  5:56 PM  Result Value Ref Range Status   Specimen Description ABSCESS ABDOMEN  Final   Special Requests NONE  Final   Gram Stain   Final    MODERATE WBC PRESENT,BOTH PMN AND MONONUCLEAR NO SQUAMOUS EPITHELIAL CELLS SEEN MODERATE GRAM POSITIVE COCCI IN PAIRS IN CHAINS IN CLUSTERS FEW [place] NEGATIVE RODS Performed  at [place] Performed at [facility]    Report Status PENDING  Incomplete  Clostridium Difficile by PCR     Status: Abnormal   Collection Time: 10/29/14  9:58 AM  Result Value Ref Range Status   C difficile by pcr POSITIVE (A) NEGATIVE Final    Comment: CRITICAL RESULT CALLED TO, READ BACK BY AND VERIFIED WITH: A.FORD,RN 10/30/14 @1420  BY V.WILKINS    Assessment: 82 YOM with Crohn's disease, hx of recurrent C-diff, was on cipro/flagyl/diflucan for severe diverticulitis and intra abd abscess, surgery recommended changing ABX to Zosyn on 3/19. Pt is afebrile  and WBC is 10.7. Dose remains appropriate as renal function has been stable.  Flucon 3/17>> Cipro 3/17>> 3/19 Flagyl 3/17>> 3/19 Zosyn 3/19 >> PO vanc 3/17 >>  3/17 Blood cx x 2  3/18 abscess -  3/18 C-diff pcr  Goal of Therapy:  Resolution of infection  Plan:  - Continue zosyn 3.375gm IV Q8H (4 hr inf) - F/u renal fxn, C&S, clinical status and LOT  *Pharmacy will sign-off and follow peripherally as no dose adjustment are anticipated. Thank you for the consult!  Salome Arnt, PharmD, BCPS Pager # [phone] 10/30/2014 11:02 AM

## 2014-10-30 NOTE — Progress Notes (Signed)
[facility] Progress Note     Subjective: Pt doing minimally better today.  Mild nausea, no vomiting.  Tolerating ice chips.  Requiring PCA for pain control frequently.  Ambulating some.  Some flatus, had a loose BM today at 6am.   Wife and dad at bedside.     Objective: Vital signs in last 24 hours: Temp:  [98.7 F (37.1 C)-99.1 F (37.3 C)] 98.8 F (37.1 C) (03/21 0558) Pulse Rate:  [95-114] 108 (03/21 0558) Resp:  [9-18] 9 (03/21 0859) BP: (122-141)/(91-92) 122/91 mmHg (03/21 0558) SpO2:  [93 %-98 %] 95 % (03/21 0859) Weight:  [165 lb 5.5 oz (75 kg)] 165 lb 5.5 oz (75 kg) (03/21 0500) Last BM Date: 10/29/14   Intake/Output from previous day: 03/20 0701 - 03/21 0700 In: 1410 [I.V.:1260; IV Piggyback:150] Out: 550 [Urine:550] Intake/Output this shift: Total I/O In: 386.7 [I.V.:381.7; Other:5] Out: -    PE: Gen:  Alert, NAD, pleasant Abd: Soft, tender in LLQ, distended, +BS, no HSM, no abdominal scars noted    Lab Results:   Recent Labs  10/29/14 0605 10/30/14 0615  WBC 12.5* 10.7*  HGB 8.5* 8.0*  HCT 26.4* 25.0*  PLT 743* 752*   BMET  Recent Labs  10/29/14 0605 10/30/14 0615  NA 129* 131*  K 3.2* 3.8  CL 94* 95*  CO2 26 28  GLUCOSE 85 91  BUN <5* <5*  CREATININE 0.82 0.81  CALCIUM 7.6* 7.6*   PT/INR No results for input(s): LABPROT, INR in the last 72 hours. CMP     Component Value Date/Time   NA 131* 10/30/2014 0615   K 3.8 10/30/2014 0615   CL 95* 10/30/2014 0615   CO2 28 10/30/2014 0615   GLUCOSE 91 10/30/2014 0615   BUN <5* 10/30/2014 0615   CREATININE 0.81 10/30/2014 0615   CALCIUM 7.6* 10/30/2014 0615   PROT 4.7* 10/28/2014 0243   ALBUMIN 1.5* 10/28/2014 0243   AST 10 10/28/2014 0243   ALT 6 10/28/2014 0243   ALKPHOS 65 10/28/2014 0243   BILITOT 0.5 10/28/2014 0243   GFRNONAA >90 10/30/2014 0615   GFRAA >90 10/30/2014 0615   Lipase  No results found for: LIPASE     Studies/Results: No results  found.  Anti-infectives: Anti-infectives    Start     Dose/Rate Route Frequency Ordered Stop   10/28/14 1200  piperacillin-tazobactam (ZOSYN) IVPB 3.375 g     3.375 g 12.5 mL/hr over 240 Minutes Intravenous Every 8 hours 10/28/14 1021     10/26/14 2200  metroNIDAZOLE (FLAGYL) IVPB 500 mg  Status:  Discontinued     500 mg 100 mL/hr over 60 Minutes Intravenous Every 8 hours 10/26/14 2015 10/28/14 0943   10/26/14 2100  ciprofloxacin (CIPRO) IVPB 400 mg  Status:  Discontinued     400 mg 200 mL/hr over 60 Minutes Intravenous Every 12 hours 10/26/14 2027 10/28/14 1021   10/26/14 1900  fluconazole (DIFLUCAN) IVPB 100 mg     100 mg 50 mL/hr over 60 Minutes Intravenous Every 24 hours 10/26/14 1805     10/26/14 1800  vancomycin (VANCOCIN) 50 mg/mL oral solution 125 mg     125 mg Oral 4 times per day 10/26/14 1655         Assessment/Plan Intra-abdominal abscess with crohn's disease -Patient now on Zosyn along with oral vancomycin -IR placed a perc drain 10/27/14. The patient likely has a fistulous connection from his colon to this abscess cavity, although he currently has minimal output,  but it is feculent. CXs are NGTD. -Cont NPO for now. He may need bowel rest still for a couple more days -WBC down to 10.7 today.  Continue to monitor with conservative management.  If the patient does not improve with conservative management, he will likely require a Hartman's procedure. Hypoalbuminemia/low prealbumin -Albumin is 1.5. Patient states he was eating great prior to admission; however, this is very low for a 44 yo male. Prealbumin is 4.3.  NPO, may need TPN given malnourished state currently, potentially surgery if not improving. Recurrent C.diff - stool still positive 10/29/14 -Oral Vanc, ID following    LOS: 4 days    DORT, Selicia Windom 10/30/2014, 9:35 AM Pager: [phone]

## 2014-10-30 NOTE — Progress Notes (Signed)
Subjective: Feeling better since admission, but still with a significant amount of pain in the LLQ.  He also reports some issues with dysuria and spasms.  The general body spasms are new.  Objective: Vital signs in last 24 hours: Temp:  [98.7 F (37.1 C)-99.1 F (37.3 C)] 98.8 F (37.1 C) (03/21 0558) Pulse Rate:  [95-114] 108 (03/21 0558) Resp:  [10-18] 13 (03/21 0558) BP: (122-141)/(91-92) 122/91 mmHg (03/21 0558) SpO2:  [93 %-98 %] 95 % (03/21 0558) Weight:  [75 kg (165 lb 5.5 oz)] 75 kg (165 lb 5.5 oz) (03/21 0500) Last BM Date: 10/29/14  Intake/Output from previous day: 03/20 0701 - 03/21 0700 In: 1410 [I.V.:1260; IV Piggyback:150] Out: 550 [Urine:550] Intake/Output this shift:    General appearance: alert and no distress GI: tender in the LLQ and mild diffuse tenderness  Lab Results:  Recent Labs  10/28/14 0243 10/29/14 0605 10/30/14 0615  WBC 11.8* 12.5* 10.7*  HGB 7.8* 8.5* 8.0*  HCT 24.3* 26.4* 25.0*  PLT 704* 743* 752*   BMET  Recent Labs  10/28/14 0243 10/29/14 0605 10/30/14 0615  NA 130* 129* 131*  K 3.5 3.2* 3.8  CL 98 94* 95*  CO2 23 26 28   GLUCOSE 84 85 91  BUN <5* <5* <5*  CREATININE 0.88 0.82 0.81  CALCIUM 7.4* 7.6* 7.6*   LFT  Recent Labs  10/28/14 0243  PROT 4.7*  ALBUMIN 1.5*  AST 10  ALT 6  ALKPHOS 65  BILITOT 0.5   PT/INR No results for input(s): LABPROT, INR in the last 72 hours. Hepatitis Panel No results for input(s): HEPBSAG, HCVAB, HEPAIGM, HEPBIGM in the last 72 hours. C-Diff No results for input(s): CDIFFTOX in the last 72 hours. Fecal Lactopherrin No results for input(s): FECLLACTOFRN in the last 72 hours.  Studies/Results: No results found.  Medications:  Scheduled: . antiseptic oral rinse  7 mL Mouth Rinse q12n4p  . chlorhexidine  15 mL Mouth Rinse BID  . fluconazole (DIFLUCAN) IV  100 mg Intravenous Q24H  . heparin  5,000 Units Subcutaneous 3 times per day  . HYDROmorphone PCA 0.3 mg/mL   Intravenous  6 times per day  . magic mouthwash  10 mL Oral QID  . morphine  30 mg Oral Q12H  . piperacillin-tazobactam (ZOSYN)  IV  3.375 g Intravenous Q8H  . vancomycin  125 mg Oral 4 times per day   Continuous: . 0.9 % sodium chloride with kcl 100 mL/hr at 10/30/14 0405    Assessment/Plan: 1) Sigmoid colon abscess. 2) Crohn's disease.   The thought is that he may have a fistula.  There is minimal drainage, but he has been afebrile.  Currently he is on Zosyn and vancomycin.  No issues with diarrhea at this time point.  Odynophagia has somewhat improved, but he feels that there is some spasm in the lower esophagus.  It does not feels as if it is GERD.  I do not know what to make of the whole body spasms.  He was on Dilaudid during his prior admission without any issues.  Currently he is NPO and his albumin is 1.5.  He was on 15 mg of prednisone prior to his readmission and it does not appear that he has any significant adverse effects off of the medication.  I will continue to hold it at this time.  The plan is to minimize his immunosuppression with his abscess.  Plan: 1) Continue with antibiotics. 2) IR to reassess drainage. 3) ? TPN for  the possibility of surgery if patient does not make any significant improvements.     LOS: 4 days   Haisley Arens D 10/30/2014, 8:11 AM

## 2014-10-30 NOTE — Progress Notes (Signed)
Subjective: Pt ok, feeling a bit better each day. S/p perc drain 3/18  Objective: Physical Exam: BP 122/91 mmHg  Pulse 108  Temp(Src) 98.8 F (37.1 C) (Oral)  Resp 15  Ht 5\' 7"  (1.702 m)  Wt 165 lb 5.5 oz (75 kg)  BMI 25.89 kg/m2  SpO2 100% LLQ drain intact, site clean, min tender Not much output, but brown/feculent   Labs: CBC  Recent Labs  10/29/14 0605 10/30/14 0615  WBC 12.5* 10.7*  HGB 8.5* 8.0*  HCT 26.4* 25.0*  PLT 743* 752*   BMET  Recent Labs  10/29/14 0605 10/30/14 0615  NA 129* 131*  K 3.2* 3.8  CL 94* 95*  CO2 26 28  GLUCOSE 85 91  BUN <5* <5*  CREATININE 0.82 0.81  CALCIUM 7.6* 7.6*   LFT  Recent Labs  10/28/14 0243  PROT 4.7*  ALBUMIN 1.5*  AST 10  ALT 6  ALKPHOS 65  BILITOT 0.5   PT/INR No results for input(s): LABPROT, INR in the last 72 hours.   Studies/Results: No results found.  Assessment/Plan: Intra-abdominal abscess S/p perc drain 3/18 Output concerning for fistula. Recommend keeping drainage bulb unengaged(no suction). Keep flushed as ordered. IR following with CCS    LOS: 4 days    Ascencion Dike PA-C 10/30/2014 1:08 PM

## 2014-10-30 NOTE — Progress Notes (Signed)
Patient ID: Vincent Black, male   DOB: 21-Jan-1971, 44 y.o.   MRN: [id number]  [facility] PROGRESS NOTE  Vincent Black:[id number] DOB: April 17, 1971 DOA: 10/26/2014 PCP: No PCP Per Patient   Brief narrative:    44 y.o. male with Crohn's disease, C. difficile colitis presented with abd pain, fever, chills, poor oral intake several days in duration.   Assessment/Plan:    Principal Problem:   Sepsis secondary to severe diverticulitis and intra abd abscess - criteria for sepsis met on admission with T 102.2, HR 110, BP 110/73, WBC 14.6 - placed on ABX: Cipro and Flagyl IV but due to extent of the illness, surgery recommended changing ABX to Zosyn 3/19 - pt reports feeling better this AM but still with significant pain  - oral vanc continued for recent C. Diff treatment - WBC is trending down since admission: 14.6 --> 11.9 --> 11.8 --> 12.8 --> 10.7 - follow up on surgery recommendations    Intra-abdominal abscess - appreciate IR following and assisting - IR placed a perc drain 3/18 that yielded 50cc of feculent material - patient likely has a fistulous connection from his colon to this abscess cavity - ABX as noted above    Severe diverticulitis - ABX as noted above Active Problems:   Recent C. Diff - continue oral vancomycin for now  - C. Diff from 3/20 positive   Crohn's disease - appreciate GI team following    Normocytic anemia, secondary to Crohn's disease - appears that Hg at baseline typically ~8 - likely dilutional component from IVF pt has been receiving  - no signs of active bleeding - repeat CBC in AM   Hyponatremia - from pre renal etiology and dehydration in the setting of sepsis - Na improving    Oral thrush - continue Fluconazole  - added magic mouthwash and pt reports he has less odynophagia    Moderate PCM - in the context of acute illness - has to be NPO for now until abscess drained    Hypokalemia - keep K > 4 - supplement via IV - cmg is  1.8, will add 2 gm mg today IV   Thrombocytosis - monitor   DVT prophylaxis - Heparin SQ  Code Status: Full.  Family Communication:  plan of care discussed with the patient and wife at bedside, father at bedside  Disposition Plan: Home when stable.   IV access:  Peripheral IV  Procedures and diagnostic studies:    Ct Abdomen Pelvis W Contrast  10/26/2014    Severe colitis or diverticulitis involving the proximal sigmoid colon, with large pericolonic abscess containing stool measuring approximately 6 x 8 cm. Background  Mild diffuse colitis also noted, without other areas of colonic perforation or abscess.  No evidence of free intraperitoneal air.    Medical Consultants:  GI - Dr. Benson Norway IR ID Surgery   Other Consultants:  None  IAnti-Infectives:   Ciprofloxacin IV 3/17 --> 3/19 Metronidazole IV 3/17 --> 3/19 Zosyn 3/19 --> Vancomycin PO 3/17 --> Fluconazole IV 3/17 -->  Faye Ramsay, MD  [facility] Pager [phone]   If 7PM-7AM, please contact night-coverage [email or web address] Password [facility] 10/30/2014, 11:43 AM   LOS: 4 days   HPI/Subjective: No events overnight.   Objective: Filed Vitals:   10/30/14 0500 10/30/14 0558 10/30/14 0845 10/30/14 0859  BP:  122/91    Pulse:  108    Temp:  98.8 F (37.1 C)    TempSrc:  Oral    Resp:  13 11 9   Height:      Weight: 75 kg (165 lb 5.5 oz)     SpO2:  95% 95% 95%    Intake/Output Summary (Last 24 hours) at 10/30/14 1143 Last data filed at 10/30/14 1016  Gross per 24 hour  Intake 1941.67 ml  Output    253 ml  Net 1688.67 ml    Exam:   General:  Pt is alert, follows commands appropriately, not in acute distress  Cardiovascular: Regular rhythm, tachycardic, S1/S2, no murmurs, no rubs, no gallops  Respiratory: Clear to auscultation bilaterally, no wheezing, no crackles, no rhonchi  Abdomen: Soft, tender in epigastric area, non distended, bowel sounds present, no guarding  Extremities: No edema, pulses DP and PT  palpable bilaterally  Neuro: Grossly nonfocal  Data Reviewed: Basic Metabolic Panel:  Recent Labs Lab 10/26/14 1825 10/27/14 0630 10/28/14 0243 10/29/14 0500 10/29/14 0605 10/30/14 0615  NA 129* 134* 130*  --  129* 131*  K 3.7 4.0 3.5  --  3.2* 3.8  CL 89* 98 98  --  94* 95*  CO2 30 29 23   --  26 28  GLUCOSE 105* 83 84  --  85 91  BUN 11 9 <5*  --  <5* <5*  CREATININE 0.99 0.89 0.88  --  0.82 0.81  CALCIUM 7.9* 8.1* 7.4*  --  7.6* 7.6*  MG  --   --   --  1.8  --   --    Liver Function Tests:  Recent Labs Lab 10/26/14 1825 10/27/14 0630 10/28/14 0243  AST 17 10 10   ALT 9 7 6   ALKPHOS 81 68 65  BILITOT 0.8 0.3 0.5  PROT 6.1 5.3* 4.7*  ALBUMIN 2.0* 1.7* 1.5*   CBC:  Recent Labs Lab 10/26/14 1825 10/27/14 0630 10/28/14 0243 10/29/14 0605 10/30/14 0615  WBC 14.6* 11.9* 11.8* 12.5* 10.7*  HGB 9.0* 8.0* 7.8* 8.5* 8.0*  HCT 27.9* 25.0* 24.3* 26.4* 25.0*  MCV 92.7 92.9 92.4 93.0 92.6  PLT 782* 626* 704* 743* 752*   Scheduled Meds: . fluconazole (DIFLUCAN) IV  100 mg Intravenous Q24H  . heparin  5,000 Units Subcutaneous 3 times per day  . magic mouthwash  10 mL Oral QID  . morphine  30 mg Oral Q12H  . piperacillin-tazobactam (ZOSYN)  IV  3.375 g Intravenous Q8H  . vancomycin  125 mg Oral 4 times per day    Continuous Infusions: . 0.9 % sodium chloride with kcl 100 mL/hr at 10/30/14 1016

## 2014-10-30 NOTE — Progress Notes (Signed)
Patient ID: Vincent Black, male   DOB: 05/12/1971, 44 y.o.   MRN: [id number]         [facility]    Date of Admission:  10/26/2014           Day 5 IV antibiotics (currently on piperacillin tazobactam)        Day 5 oral vancomycin  Principal Problem:   Pericolonic abscess Active Problems:   History of Clostridium difficile colitis   Odynophagia   Crohn's disease   Protein-calorie malnutrition, severe   Ankylosing spondylitis   Unintentional weight loss   Normocytic anemia   Left lower quadrant pain   . antiseptic oral rinse  7 mL Mouth Rinse q12n4p  . chlorhexidine  15 mL Mouth Rinse BID  . fluconazole (DIFLUCAN) IV  100 mg Intravenous Q24H  . heparin  5,000 Units Subcutaneous 3 times per day  . HYDROmorphone PCA 0.3 mg/mL   Intravenous 6 times per day  . magic mouthwash  10 mL Oral QID  . morphine  30 mg Oral Q12H  . piperacillin-tazobactam (ZOSYN)  IV  3.375 g Intravenous Q8H  . vancomycin  125 mg Oral 4 times per day    Subjective: His abdominal pain is a little bit better since his percutaneous drain was placed. He is not having any diarrhea  Review of Systems: Pertinent items are noted in HPI.  Past Medical History  Diagnosis Date  . Crohn disease   . C. difficile colitis 09/30/2014  . Ankylosing spondylitis 09/30/2014  . Anxiety     History  Substance Use Topics  . Smoking status: Former Smoker -- 0.50 packs/day for 10 years    Types: Cigarettes  . Smokeless tobacco: Never Used     Comment: 'quit smoking in ~ 2014"  . Alcohol Use: Yes     Comment: 10/25/2014 "might have a drink a couple times/yr"    History reviewed. No pertinent family history. No Known Allergies  OBJECTIVE: Blood pressure 134/90, pulse 106, temperature 98.8 F (37.1 C), temperature source Oral, resp. rate 12, height 5\' 7"  (1.702 m), weight 165 lb 5.5 oz (75 kg), SpO2 100 %. General: he is sitting up in a chair. He is pale and appears somewhat  uncomfortable from pain  Lab Results Lab Results  Component Value Date   WBC 10.7* 10/30/2014   HGB 8.0* 10/30/2014   HCT 25.0* 10/30/2014   MCV 92.6 10/30/2014   PLT 752* 10/30/2014    Lab Results  Component Value Date   CREATININE 0.81 10/30/2014   BUN <5* 10/30/2014   NA 131* 10/30/2014   K 3.8 10/30/2014   CL 95* 10/30/2014   CO2 28 10/30/2014    Lab Results  Component Value Date   ALT 6 10/28/2014   AST 10 10/28/2014   ALKPHOS 65 10/28/2014   BILITOT 0.5 10/28/2014     Microbiology: Recent Results (from the past 240 hour(s))  Culture, blood (routine x 2)     Status: None (Preliminary result)   Collection Time: 10/26/14 10:13 PM  Result Value Ref Range Status   Specimen Description BLOOD LEFT ARM  Final   Special Requests BOTTLES DRAWN AEROBIC AND ANAEROBIC 5CC  Final   Culture   Final           BLOOD CULTURE RECEIVED NO GROWTH TO DATE CULTURE WILL BE HELD FOR 5 DAYS BEFORE ISSUING A FINAL NEGATIVE REPORT Performed at [facility]    Report Status PENDING  Incomplete  Culture, blood (routine x 2)     Status: None (Preliminary result)   Collection Time: 10/26/14 10:15 PM  Result Value Ref Range Status   Specimen Description BLOOD LEFT HAND  Final   Special Requests   Final    BOTTLES DRAWN AEROBIC AND ANAEROBIC [place] BLUE 3CC PURPLE   Culture   Final           BLOOD CULTURE RECEIVED NO GROWTH TO DATE CULTURE WILL BE HELD FOR 5 DAYS BEFORE ISSUING A FINAL NEGATIVE REPORT Performed at [facility]    Report Status PENDING  Incomplete  Anaerobic culture     Status: None (Preliminary result)   Collection Time: 10/27/14  5:56 PM  Result Value Ref Range Status   Specimen Description ABSCESS ABDOMEN  Final   Special Requests NONE  Final   Gram Stain PENDING  Incomplete   Culture   Final    NO ANAEROBES ISOLATED; CULTURE IN PROGRESS FOR 5 DAYS Performed at [facility]    Report Status PENDING  Incomplete  Culture, routine-abscess      Status: None (Preliminary result)   Collection Time: 10/27/14  5:56 PM  Result Value Ref Range Status   Specimen Description ABSCESS ABDOMEN  Final   Special Requests NONE  Final   Gram Stain   Final    MODERATE WBC PRESENT,BOTH PMN AND MONONUCLEAR NO SQUAMOUS EPITHELIAL CELLS SEEN MODERATE GRAM POSITIVE COCCI IN PAIRS IN CHAINS IN CLUSTERS FEW GRAM NEGATIVE RODS Performed at [facility]    Culture   Final    ABUNDANT GRAM NEGATIVE RODS Performed at [facility]    Report Status PENDING  Incomplete  Clostridium Difficile by PCR     Status: Abnormal   Collection Time: 10/29/14  9:58 AM  Result Value Ref Range Status   C difficile by pcr POSITIVE (A) NEGATIVE Final    Comment: CRITICAL RESULT CALLED TO, READ BACK BY AND VERIFIED WITH: A.FORD,RN 10/30/14 @1420  BY V.WILKINS     Assessment: He has a polymicrobial pericolonic abscess. I will continue piperacillin tazobactam pending final cultures and narrow antibiotic therapy as much as practical in order to not increase risk of a more severe relapse of his C. Difficile colitis.  Plan: 1. Continue piperacillin tazobactam pending abscess culture results 2. Continue oral vancomycin  Michel Bickers, MD [facility] [place] [phone] pager   [phone] cell 10/30/2014, 5:07 PM

## 2014-10-31 LAB — BASIC METABOLIC PANEL WITH GFR
Anion gap: 7 (ref 5–15)
BUN: <5 mg/dL — ABNORMAL LOW (ref 6–23)
CO2: 30 mmol/L (ref 19–32)
Calcium: 7.3 mg/dL — ABNORMAL LOW (ref 8.4–10.5)
Chloride: 94 mmol/L — ABNORMAL LOW (ref 96–112)
Creatinine, Ser: 0.8 mg/dL (ref 0.50–1.35)
GFR calc Af Amer: >90 mL/min
GFR calc non Af Amer: >90 mL/min
Glucose, Bld: 90 mg/dL (ref 70–99)
Potassium: 4 mmol/L (ref 3.5–5.1)
Sodium: 131 mmol/L — ABNORMAL LOW (ref 135–145)

## 2014-10-31 LAB — CULTURE, ROUTINE-ABSCESS

## 2014-10-31 LAB — CBC
HCT: 24.3 % — ABNORMAL LOW (ref 39.0–52.0)
Hemoglobin: 7.7 g/dL — ABNORMAL LOW (ref 13.0–17.0)
MCH: 29.4 pg (ref 26.0–34.0)
MCHC: 31.7 g/dL (ref 30.0–36.0)
MCV: 92.7 fL (ref 78.0–100.0)
Platelets: 805 10*3/uL — ABNORMAL HIGH (ref 150–400)
RBC: 2.62 MIL/uL — ABNORMAL LOW (ref 4.22–5.81)
RDW: 16.4 % — AB (ref 11.5–15.5)
WBC: 10.7 10*3/uL — AB (ref 4.0–10.5)

## 2014-10-31 LAB — GLUCOSE, CAPILLARY: Glucose-Capillary: 93 mg/dL (ref 70–99)

## 2014-10-31 LAB — PHOSPHORUS: PHOSPHORUS: 2.7 mg/dL (ref 2.3–4.6)

## 2014-10-31 LAB — MAGNESIUM: Magnesium: 1.9 mg/dL (ref 1.5–2.5)

## 2014-10-31 MED ORDER — SODIUM CHLORIDE 0.9 % IJ SOLN
10.0000 mL | INTRAMUSCULAR | Status: DC | PRN
  Administered 2014-11-01 – 2014-11-07 (×8): 10 mL
  Filled 2014-10-31 (×8): qty 40

## 2014-10-31 MED ORDER — TRACE MINERALS CR-CU-F-FE-I-MN-MO-SE-ZN IV SOLN
INTRAVENOUS | Status: AC
  Administered 2014-10-31: 18:00:00 via INTRAVENOUS
  Filled 2014-10-31: qty 720

## 2014-10-31 MED ORDER — INSULIN ASPART 100 UNIT/ML ~~LOC~~ SOLN
0.0000 [IU] | Freq: Four times a day (QID) | SUBCUTANEOUS | Status: DC
  Administered 2014-11-01: 2 [IU] via SUBCUTANEOUS
  Administered 2014-11-01 – 2014-11-03 (×6): 1 [IU] via SUBCUTANEOUS
  Administered 2014-11-04: 2 [IU] via SUBCUTANEOUS

## 2014-10-31 MED ORDER — CEFTRIAXONE SODIUM IN DEXTROSE 20 MG/ML IV SOLN
1.0000 g | INTRAVENOUS | Status: DC
  Administered 2014-10-31 – 2014-11-05 (×6): 1 g via INTRAVENOUS
  Filled 2014-10-31 (×7): qty 50

## 2014-10-31 MED ORDER — PANTOPRAZOLE SODIUM 40 MG IV SOLR
40.0000 mg | INTRAVENOUS | Status: DC
  Administered 2014-10-31 – 2014-11-04 (×5): 40 mg via INTRAVENOUS
  Filled 2014-10-31 (×6): qty 40

## 2014-10-31 MED ORDER — FAT EMULSION 20 % IV EMUL
120.0000 mL | INTRAVENOUS | Status: AC
  Administered 2014-10-31: 120 mL via INTRAVENOUS
  Filled 2014-10-31: qty 250

## 2014-10-31 MED ORDER — LORAZEPAM 2 MG/ML IJ SOLN: 1.0000 mg | Freq: Every evening | INTRAMUSCULAR | Status: DC | PRN

## 2014-10-31 MED ORDER — METRONIDAZOLE IN NACL 5-0.79 MG/ML-% IV SOLN
500.0000 mg | Freq: Three times a day (TID) | INTRAVENOUS | Status: DC
  Administered 2014-10-31 – 2014-11-06 (×17): 500 mg via INTRAVENOUS
  Filled 2014-10-31 (×19): qty 100

## 2014-10-31 MED ORDER — SODIUM CHLORIDE 0.9 % IV SOLN
INTRAVENOUS | Status: DC
  Administered 2014-10-31: 18:00:00 via INTRAVENOUS
  Filled 2014-10-31 (×3): qty 1000

## 2014-10-31 MED ORDER — METHOCARBAMOL 1000 MG/10ML IJ SOLN
1000.0000 mg | Freq: Three times a day (TID) | INTRAVENOUS | Status: DC | PRN
  Filled 2014-10-31: qty 10

## 2014-10-31 NOTE — Progress Notes (Signed)
Subjective: No real improvement.  Still with abdominal pain.  Minimal drainage.  Objective: Vital signs in last 24 hours: Temp:  [97.9 F (36.6 C)-99.2 F (37.3 C)] 98.3 F (36.8 C) (03/22 1302) Pulse Rate:  [99-113] 113 (03/22 1302) Resp:  [12-18] 18 (03/22 1302) BP: (123-145)/(83-96) 145/96 mmHg (03/22 1302) SpO2:  [95 %-100 %] 99 % (03/22 1302) Weight:  [75.8 kg (167 lb 1.7 oz)] 75.8 kg (167 lb 1.7 oz) (03/22 0400) Last BM Date: 10/31/14  Intake/Output from previous day: 03/21 0701 - 03/22 0700 In: 1376.7 [P.O.:180; I.V.:1136.7; IV Piggyback:50] Out: 121 [Urine:650; Drains:4] Intake/Output this shift: Total I/O In: 5 [Other:5] Out: -   General appearance: alert, no distress and uncomfortable GI: mildly diffusely tender, no rigidity  Lab Results:  Recent Labs  10/29/14 0605 10/30/14 0615 10/31/14 0640  WBC 12.5* 10.7* 10.7*  HGB 8.5* 8.0* 7.7*  HCT 26.4* 25.0* 24.3*  PLT 743* 752* 805*   BMET  Recent Labs  10/29/14 0605 10/30/14 0615 10/31/14 0640  NA 129* 131* 131*  K 3.2* 3.8 4.0  CL 94* 95* 94*  CO2 26 28 30   GLUCOSE 85 91 90  BUN <5* <5* <5*  CREATININE 0.82 0.81 0.80  CALCIUM 7.6* 7.6* 7.3*   LFT No results for input(s): PROT, ALBUMIN, AST, ALT, ALKPHOS, BILITOT, BILIDIR, IBILI in the last 72 hours. PT/INR No results for input(s): LABPROT, INR in the last 72 hours. Hepatitis Panel No results for input(s): HEPBSAG, HCVAB, HEPAIGM, HEPBIGM in the last 72 hours. C-Diff No results for input(s): CDIFFTOX in the last 72 hours. Fecal Lactopherrin No results for input(s): FECLLACTOFRN in the last 72 hours.  Studies/Results: No results found.  Medications:  Scheduled: . antiseptic oral rinse  7 mL Mouth Rinse q12n4p  . chlorhexidine  15 mL Mouth Rinse BID  . fluconazole (DIFLUCAN) IV  100 mg Intravenous Q24H  . heparin  5,000 Units Subcutaneous 3 times per day  . HYDROmorphone PCA 0.3 mg/mL   Intravenous 6 times per day  . insulin aspart  0-9  Units Subcutaneous 4 times per day  . magic mouthwash  10 mL Oral QID  . morphine  30 mg Oral Q12H  . pantoprazole (PROTONIX) IV  40 mg Intravenous Q24H  . piperacillin-tazobactam (ZOSYN)  IV  3.375 g Intravenous Q8H  . vancomycin  125 mg Oral 4 times per day   Continuous: . Marland KitchenTPN (CLINIMIX-E) Adult     And  . fat emulsion    . 0.9 % sodium chloride with kcl 100 mL/hr at 10/30/14 1254  . 0.9 % sodium chloride with kcl      Assessment/Plan: 1) Abscess. 2) Crohn's disease. 3) C. Diff.   It does not appear that he has made any significant progress.  He still feels unwell.  I think he will require surgery to advance his clinical status.  His albumin is low and I did see from the surgical note that there are plans for TPN.  Plan: 1) Continue with antibiotics. 2) TPN per Surgery. 3) Continue with pain control.   LOS: 5 days   [place] Sigmund D 10/31/2014, 2:06 PM

## 2014-10-31 NOTE — Progress Notes (Signed)
[facility] Progress Note     Subjective: Pt doesn't feel much better, abdominal pain still significant requiring frequent PCA usage.  Says he feels more bloated today.  He says drainage was leaking around the drain in LLQ.  Still nauseated.  No vomiting.  Urinating well.  Had another BM today.    Objective: Vital signs in last 24 hours: Temp:  [97.9 F (36.6 C)-99.2 F (37.3 C)] 97.9 F (36.6 C) (03/22 0817) Pulse Rate:  [99-112] 112 (03/22 0817) Resp:  [12-18] 18 (03/22 0817) BP: (123-143)/(83-92) 143/92 mmHg (03/22 0817) SpO2:  [95 %-100 %] 100 % (03/22 0817) Weight:  [75.8 kg (167 lb 1.7 oz)] 75.8 kg (167 lb 1.7 oz) (03/22 0400) Last BM Date: 10/31/14  Intake/Output from previous day: 03/21 0701 - 03/22 0700 In: 1376.7 [P.O.:180; I.V.:1136.7; IV Piggyback:50] Out: 654 [Urine:650; Drains:4] Intake/Output this shift: Total I/O In: 5 [Other:5] Out: -   PE: Gen:  Alert, NAD, pleasant Abd: Soft, distended, tender to palpation in LLQ over drain, +BS, no HSM, IR perc drain in place in LLQ with brown feculent drainage, dressing just replaced and clean   Lab Results:   Recent Labs  10/30/14 0615 10/31/14 0640  WBC 10.7* 10.7*  HGB 8.0* 7.7*  HCT 25.0* 24.3*  PLT 752* 805*   BMET  Recent Labs  10/30/14 0615 10/31/14 0640  NA 131* 131*  K 3.8 4.0  CL 95* 94*  CO2 28 30  GLUCOSE 91 90  BUN <5* <5*  CREATININE 0.81 0.80  CALCIUM 7.6* 7.3*   PT/INR No results for input(s): LABPROT, INR in the last 72 hours. CMP     Component Value Date/Time   NA 131* 10/31/2014 0640   K 4.0 10/31/2014 0640   CL 94* 10/31/2014 0640   CO2 30 10/31/2014 0640   GLUCOSE 90 10/31/2014 0640   BUN <5* 10/31/2014 0640   CREATININE 0.80 10/31/2014 0640   CALCIUM 7.3* 10/31/2014 0640   PROT 4.7* 10/28/2014 0243   ALBUMIN 1.5* 10/28/2014 0243   AST 10 10/28/2014 0243   ALT 6 10/28/2014 0243   ALKPHOS 65 10/28/2014 0243   BILITOT 0.5 10/28/2014 0243   GFRNONAA >90  10/31/2014 0640   GFRAA >90 10/31/2014 0640   Lipase  No results found for: LIPASE     Studies/Results: No results found.  Anti-infectives: Anti-infectives    Start     Dose/Rate Route Frequency Ordered Stop   10/28/14 1200  piperacillin-tazobactam (ZOSYN) IVPB 3.375 g     3.375 g 12.5 mL/hr over 240 Minutes Intravenous Every 8 hours 10/28/14 1021     10/26/14 2200  metroNIDAZOLE (FLAGYL) IVPB 500 mg  Status:  Discontinued     500 mg 100 mL/hr over 60 Minutes Intravenous Every 8 hours 10/26/14 2015 10/28/14 0943   10/26/14 2100  ciprofloxacin (CIPRO) IVPB 400 mg  Status:  Discontinued     400 mg 200 mL/hr over 60 Minutes Intravenous Every 12 hours 10/26/14 2027 10/28/14 1021   10/26/14 1900  fluconazole (DIFLUCAN) IVPB 100 mg     100 mg 50 mL/hr over 60 Minutes Intravenous Every 24 hours 10/26/14 1805     10/26/14 1800  vancomycin (VANCOCIN) 50 mg/mL oral solution 125 mg     125 mg Oral 4 times per day 10/26/14 1655         Assessment/Plan Intra-abdominal abscess with crohn's disease -Patient now on Zosyn along with oral vancomycin -IR placed a perc drain 10/27/14. The patient likely  has a fistulous connection from his colon to this abscess cavity, although he currently has minimal output, but it is feculent. CXs show Citrobacter freundii & Klebsiella oxytoca sensitive to Zosyn - Gram positive cocci in pairs/chains/clusters, few gram negatives -Cont NPO for now, start TPN/PICC -WBC stable at 10.7 today. Continue to monitor with conservative management.  -The patient has not seen much improvement with conservative management, he will likely require a Hartman's procedure - will discuss with Dr. Rosendo Gros Hypoalbuminemia/low prealbumin - Starting TPN -Albumin is 1.5. Patient states he was eating great prior to admission; however, this is very low for a 44 yo male. Prealbumin is 4.3. NPO.  Insert Picc, start TPN given malnourished state currently, potentially surgery if not  improving. Recurrent C.diff - stool still positive 10/29/14 -Oral Vanc, ID following Pyrosis -Start PPI     LOS: 5 days    DORT, Asante Blanda 10/31/2014, 9:01 AM Pager: [phone]

## 2014-10-31 NOTE — Progress Notes (Signed)
PARENTERAL NUTRITION CONSULT NOTE - INITIAL  Pharmacy Consult:  TPN Indication:  Sigmoid abscess with high likelihood of fistula formation  No Known Allergies  Patient Measurements: Height: 5\' 7"  (170.2 cm) Weight: 167 lb 1.7 oz (75.8 kg) IBW/kg (Calculated) : 66.1  Usual weight = 96 kg Current weight = 76 kg  Vital Signs: Temp: 97.9 F (36.6 C) (03/22 0817) Temp Source: Oral (03/22 0817) BP: 143/92 mmHg (03/22 0817) Pulse Rate: 112 (03/22 0817) Intake/Output from previous day: 03/21 0701 - 03/22 0700 In: 1376.7 [P.O.:180; I.V.:1136.7; IV Piggyback:50] Out: 654 [Urine:650; Drains:4] Intake/Output from this shift: Total I/O In: 5 [Other:5] Out: -   Labs:  Recent Labs  10/29/14 0605 10/30/14 0615 10/31/14 0640  WBC 12.5* 10.7* 10.7*  HGB 8.5* 8.0* 7.7*  HCT 26.4* 25.0* 24.3*  PLT 743* 752* 805*     Recent Labs  10/29/14 0500 10/29/14 0605 10/29/14 1034 10/30/14 0615 10/31/14 0640  NA  --  129*  --  131* 131*  K  --  3.2*  --  3.8 4.0  CL  --  94*  --  95* 94*  CO2  --  26  --  28 30  GLUCOSE  --  85  --  91 90  BUN  --  <5*  --  <5* <5*  CREATININE  --  0.82  --  0.81 0.80  CALCIUM  --  7.6*  --  7.6* 7.3*  MG 1.8  --   --   --  1.9  PHOS  --   --   --   --  2.7  PREALBUMIN  --   --  4.3*  --   --    Estimated Creatinine Clearance: 112.5 mL/min (by C-G formula based on Cr of 0.8).   No results for input(s): GLUCAP in the last 72 hours.  Medical History: Past Medical History  Diagnosis Date  . Crohn disease   . C. difficile colitis 09/30/2014  . Ankylosing spondylitis 09/30/2014  . Anxiety      Insulin Requirements in the past 24 hours:  Not yet on SSI  Assessment: 40 YOM with history of Crohn's and recent C.diff colitis presented on 10/26/14 with worsening abdominal pain.  CT showed sigmoid abscess and is s/p drain placement on 10/27/14 by IR.  Medical team suspects fistulous connection from colon to abscess cavity.  Given likelihood of  surgery, high suspicion of fistula and poor nutritional status (baseline prealbumin at 4.3 and significant weight loss), Pharmacy consulted to initiate TPN.  GI: Crohn's on Humira, Imuran, prednisone PTA.  Intra-abd abscess drain yielded feculent material.  Prealbumin 4.3, albumin 1.5, BM x2 Endo: no hx DM - AM glucose controlled Lytes: low Na/CL and TPN may exacerbate, others WNL - watch Renal: SCr stable, CrCL 113 ml/min - decent UOP 0.4 ml/kg/hr, NS30K at 100 ml/hr Pulm: currently on RA Cards: no hx - BP trending up, HR high normal-high Hepatobil: WNL Neuro: hx anxiety and ankylosing spondylitis - Dilaudid PCA + MS Contin, receiving PRN Zofran ID: Diflucan/Zosyn for intra-abd abscess + PO Vanc for C.diff - afebrile, WBC at 10.7 Best Practices: heparin SQ, MC TPN Access: PICC to be placed 3/22 prior to TPN initiation TPN day#: 0 (3/22 >> )  Current Nutrition:  NPO Anticipated goal: Clinimix E 5/15 at 100 ml/hr + IVFE at 7 ml/hr = 2040 kCal and 120gm of protein per day  Nutritional Goals:  1900-2100 kCal, 100-120 grams of protein per day   Plan:  -  Clinimix E 5/15 at 30 ml/hr + IVFE at 5 ml/hr - Daily multivitamin and trace elements - Start sensitive SSI Q6H.  D/C if CBGs remain controlled at goal TPN rate. - Decrease IVF to 70 ml/hr when TPN starts - Standard TPN labs and orders    Mykhia Danish D. Mina Marble, PharmD, BCPS Pager:  [phone] 10/31/2014, 9:16 AM

## 2014-10-31 NOTE — Progress Notes (Signed)
Patient ID: Vincent Black, male   DOB: Jul 25, 1971, 44 y.o.   MRN: [id number]  [facility] PROGRESS NOTE  Vincent Black LNL:[id number] DOB: [birth year] DOA: 10/26/2014 PCP: No PCP Per Patient   Brief narrative:    44 y.o. male with Crohn's disease, C. difficile colitis presented with abd pain, fever, chills, poor oral intake several days in duration.   Assessment/Plan:    Principal Problem:   Sepsis secondary to severe diverticulitis and intra abd abscess - criteria for sepsis met on admission with T 102.2, HR 110, BP 110/73, WBC 14.6 - placed on ABX: Cipro and Flagyl IV but due to extent of the illness, surgery recommended changing ABX to Zosyn 3/19 - pt still with significant pain  - WBC is trending: 14.6 --> 11.9 --> 11.8 --> 12.8 --> 10.7 --> 10.7 - follow up on surgery recommendations    Intra-abdominal abscess - appreciate IR following and assisting - IR placed a perc drain 3/18 that yielded 50cc of feculent material, cultures positive for Citrobacter and Klebsiella  - ABX as noted above  - start TPN today    Severe diverticulitis - ABX as noted above Active Problems:   Recent C. Diff - continue oral vancomycin  - C. Diff from 3/20 positive   Crohn's disease - appreciate GI team following    Normocytic anemia, secondary to Crohn's disease - appears that Hg at baseline typically ~8 - likely dilutional component from IVF pt has been receiving  - no signs of active bleeding - repeat CBC in AM   Hyponatremia - from pre renal etiology and dehydration in the setting of sepsis - Na stable 131   Oral thrush - continue Fluconazole  - added magic mouthwash and pt reports he has less odynophagia    Moderate PCM - in the context of acute illness - TPN to be started today    Hypokalemia - keep K > 4 - Mg is WNL   Thrombocytosis - trending up and will likely go up as pt now on TPN  DVT prophylaxis - Heparin SQ  Code Status: Full.  Family Communication:  plan of  care discussed with the patient and wife at bedside, father at bedside  Disposition Plan: Home when stable.   IV access:  Peripheral IV  Procedures and diagnostic studies:    Ct Abdomen Pelvis W Contrast  10/26/2014    Severe colitis or diverticulitis involving the proximal sigmoid colon, with large pericolonic abscess containing stool measuring approximately 6 x 8 cm. Background  Mild diffuse colitis also noted, without other areas of colonic perforation or abscess.  No evidence of free intraperitoneal air.    Medical Consultants:  GI - Dr. Benson Norway IR ID Surgery   Other Consultants:  None  IAnti-Infectives:   Ciprofloxacin IV 3/17 --> 3/19 Metronidazole IV 3/17 --> 3/19 Zosyn 3/19 --> Vancomycin PO 3/17 --> Fluconazole IV 3/17 -->  Faye Ramsay, MD  [facility] Pager [phone]   If 7PM-7AM, please contact night-coverage [email or web address] Password [email or web address] 10/31/2014, 2:50 PM   LOS: 5 days   HPI/Subjective: No events overnight.   Objective: Filed Vitals:   10/31/14 0800 10/31/14 0817 10/31/14 1200 10/31/14 1302  BP:  143/92  145/96  Pulse:  112  113  Temp:  97.9 F (36.6 C)  98.3 F (36.8 C)  TempSrc:  Oral  Oral  Resp: 18 18 18 18   Height:      Weight:      SpO2: 96% 100% 95% 99%  Intake/Output Summary (Last 24 hours) at 10/31/14 1450 Last data filed at 10/31/14 1431  Gross per 24 hour  Intake    845 ml  Output    900 ml  Net    -55 ml    Exam:   General:  Pt is alert, follows commands appropriately, not in acute distress  Cardiovascular: Regular rhythm, tachycardic, S1/S2, no murmurs, no rubs, no gallops  Respiratory: Clear to auscultation bilaterally, no wheezing, no crackles, no rhonchi  Abdomen: Soft, tender in epigastric area, non distended, bowel sounds present, no guarding  Extremities: No edema, pulses DP and PT palpable bilaterally  Neuro: Grossly nonfocal  Data Reviewed: Basic Metabolic Panel:  Recent Labs Lab 10/27/14 0630  10/28/14 0243 10/29/14 0500 10/29/14 0605 10/30/14 0615 10/31/14 0640  NA 134* 130*  --  129* 131* 131*  K 4.0 3.5  --  3.2* 3.8 4.0  CL 98 98  --  94* 95* 94*  CO2 29 23  --  26 28 30   GLUCOSE 83 84  --  85 91 90  BUN 9 <5*  --  <5* <5* <5*  CREATININE 0.89 0.88  --  0.82 0.81 0.80  CALCIUM 8.1* 7.4*  --  7.6* 7.6* 7.3*  MG  --   --  1.8  --   --  1.9  PHOS  --   --   --   --   --  2.7   Liver Function Tests:  Recent Labs Lab 10/26/14 1825 10/27/14 0630 10/28/14 0243  AST 17 10 10   ALT 9 7 6   ALKPHOS 81 68 65  BILITOT 0.8 0.3 0.5  PROT 6.1 5.3* 4.7*  ALBUMIN 2.0* 1.7* 1.5*   CBC:  Recent Labs Lab 10/27/14 0630 10/28/14 0243 10/29/14 0605 10/30/14 0615 10/31/14 0640  WBC 11.9* 11.8* 12.5* 10.7* 10.7*  HGB 8.0* 7.8* 8.5* 8.0* 7.7*  HCT 25.0* 24.3* 26.4* 25.0* 24.3*  MCV 92.9 92.4 93.0 92.6 92.7  PLT 626* 704* 743* 752* 805*   Scheduled Meds: . fluconazole  IV  100 mg Intravenous Q24H  . heparin  5,000 Units Subcutaneous 3 times per day  . magic mouthwash  10 mL Oral QID  . morphine  30 mg Oral Q12H  . ZOSYN  IV  3.375 g Intravenous Q8H  . vancomycin  125 mg Oral 4 times per day    Continuous Infusions: . Marland KitchenTPN (CLINIMIX-E) Adult     And  . fat emulsion    . 0.9 % sodium chloride with kcl 100 mL/hr at 10/30/14 1254  . 0.9 % sodium chloride with kcl

## 2014-10-31 NOTE — Progress Notes (Signed)
Unable to re-populate dilaudid pca doc notation, pt adm  1mg  of dilaudid IV.

## 2014-10-31 NOTE — Progress Notes (Signed)
NUTRITION FOLLOW-UP  INTERVENTION: TPN per pharmacy  RD to follow  NUTRITION DIAGNOSIS: Inadequate oral intake related to inability to eat as evidenced by NPO  Goal: Pt to meet >/= 90% of estimated needs; not met  Monitor:  TPN adequacy/tolerance, GI status, weight trends, labs  ASSESSMENT: Pt with PMH of Chron's disease, c. diff colitis. Presents with abdominal pain on left side ongoing for the past few months and worsening over the last few days.  Pt having bloody diarrhea, has resolved.   3/18- Per wt records pt has lost 9% of body weight in the past month (significant for time frame).  Pt reports usual body weight of 212 lbs and last weighed this in November of 2015.   3/22- Sigmoid abscess with high likelihood of fistula formation. Consulted for TPN energy requirements. Energy needs have not changes. Pt denied any N/V or abdominal pain upon assessment. Will continue to monitor TNA tolerance/adequacy and GI trends.  Scheduled to receive: Clinimix E 5/15 at 30 ml/hr + IVFE at 5 ml/hr TPN will provide 2010 kcal (100% of estimated needs), 120 grams of protein per day  Labs and medications reviewed: Low Na, Cl, BUN, Ca  Plan per Pharmacy: - Clinimix E 5/15 at 30 ml/hr + IVFE at 5 ml/hr - Daily multivitamin and trace elements - Start sensitive SSI Q6H. D/C if CBGs remain controlled at goal TPN rate. - Decrease IVF to 70 ml/hr when TPN starts - Standard TPN labs and orders   Height: Ht Readings from Last 1 Encounters:  10/26/14 5' 7"  (1.702 m)    Weight: Wt Readings from Last 1 Encounters:  10/31/14 167 lb 1.7 oz (75.8 kg)    BMI:  Body mass index is 26.17 kg/(m^2). Overweight  Estimated Nutritional Needs: Kcal: 1900-2100 kcal Protein: 100-120 g protein Fluid: >/= 1.9 L  Skin: WDL  Diet Order: Diet NPO time specified Except for: Ice Chips, Other (See Comments), Sips with Meds TPN (CLINIMIX-E) Adult  EDUCATION NEEDS: -No education needs identified at this  time   Intake/Output Summary (Last 24 hours) at 10/31/14 1057 Last data filed at 10/31/14 0914  Gross per 24 hour  Intake    850 ml  Output    651 ml  Net    199 ml    Last BM: 3/21   Labs:   Recent Labs Lab 10/29/14 0500 10/29/14 0605 10/30/14 0615 10/31/14 0640  NA  --  129* 131* 131*  K  --  3.2* 3.8 4.0  CL  --  94* 95* 94*  CO2  --  26 28 30   BUN  --  <5* <5* <5*  CREATININE  --  0.82 0.81 0.80  CALCIUM  --  7.6* 7.6* 7.3*  MG 1.8  --   --  1.9  PHOS  --   --   --  2.7  GLUCOSE  --  85 91 90    CBG (last 3)  No results for input(s): GLUCAP in the last 72 hours.  Scheduled Meds: . antiseptic oral rinse  7 mL Mouth Rinse q12n4p  . chlorhexidine  15 mL Mouth Rinse BID  . fluconazole (DIFLUCAN) IV  100 mg Intravenous Q24H  . heparin  5,000 Units Subcutaneous 3 times per day  . HYDROmorphone PCA 0.3 mg/mL   Intravenous 6 times per day  . insulin aspart  0-9 Units Subcutaneous 4 times per day  . magic mouthwash  10 mL Oral QID  . morphine  30 mg Oral Q12H  .  pantoprazole (PROTONIX) IV  40 mg Intravenous Q24H  . piperacillin-tazobactam (ZOSYN)  IV  3.375 g Intravenous Q8H  . vancomycin  125 mg Oral 4 times per day    Continuous Infusions: . Marland KitchenTPN (CLINIMIX-E) Adult     And  . fat emulsion    . 0.9 % sodium chloride with kcl 100 mL/hr at 10/30/14 1254  . 0.9 % sodium chloride with kcl      Past Medical History  Diagnosis Date  . Crohn disease   . C. difficile colitis 09/30/2014  . Ankylosing spondylitis 09/30/2014  . Anxiety     Past Surgical History  Procedure Laterality Date  . Flexible sigmoidoscopy N/A 09/29/2014    Procedure: FLEXIBLE SIGMOIDOSCOPY;  Surgeon: Beryle Beams, MD;  Location: [facility];  Service: Endoscopy;  Laterality: N/A;    Wynona Dove, MS Dietetic Intern Pager: [phone]

## 2014-10-31 NOTE — Progress Notes (Signed)
Small amount of brown drainage noted to JP site. MD Doyle Askew notified via text page. Site with DCD and drain flushed. Will continue to monitor.

## 2014-10-31 NOTE — Progress Notes (Signed)
Peripherally Inserted Central Catheter/Midline Placement  The IV Nurse has discussed with the patient and/or persons authorized to consent for the patient, the purpose of this procedure and the potential benefits and risks involved with this procedure.  The benefits include less needle sticks, lab draws from the catheter and patient may be discharged home with the catheter.  Risks include, but not limited to, infection, bleeding, blood clot (thrombus formation), and puncture of an artery; nerve damage and irregular heat beat.  Alternatives to this procedure were also discussed.  PICC/Midline Placement Documentation        Vincent Black 10/31/2014, 11:33 AM

## 2014-10-31 NOTE — Progress Notes (Signed)
PCA pump syringe changed. 2.2cc wasted. 2 RN verified Tera Helper and Dora. Charge RN Sonia Baller in room with patient as well.

## 2014-10-31 NOTE — Progress Notes (Signed)
Patient ID: Vincent Black, male   DOB: 1970/09/11, 44 y.o.   MRN: [id number]         [facility]    Date of Admission:  10/26/2014           Day 6 IV antibiotics (currently on piperacillin tazobactam)        Day 6 oral vancomycin  Principal Problem:   Pericolonic abscess Active Problems:   History of Clostridium difficile colitis   Odynophagia   Crohn's disease   Protein-calorie malnutrition, severe   Ankylosing spondylitis   Unintentional weight loss   Normocytic anemia   Left lower quadrant pain   . antiseptic oral rinse  7 mL Mouth Rinse q12n4p  . chlorhexidine  15 mL Mouth Rinse BID  . fluconazole (DIFLUCAN) IV  100 mg Intravenous Q24H  . heparin  5,000 Units Subcutaneous 3 times per day  . HYDROmorphone PCA 0.3 mg/mL   Intravenous 6 times per day  . insulin aspart  0-9 Units Subcutaneous 4 times per day  . magic mouthwash  10 mL Oral QID  . morphine  30 mg Oral Q12H  . pantoprazole (PROTONIX) IV  40 mg Intravenous Q24H  . piperacillin-tazobactam (ZOSYN)  IV  3.375 g Intravenous Q8H  . vancomycin  125 mg Oral 4 times per day    Subjective: His abdominal pain is a little bit better since his percutaneous drain was placed. He had a normal bowel movement today. He is not having any diarrhea. He is having some problems with nausea, vomiting and hiccups.  Review of Systems: Pertinent items are noted in HPI.  Past Medical History  Diagnosis Date  . Crohn disease   . C. difficile colitis 09/30/2014  . Ankylosing spondylitis 09/30/2014  . Anxiety     History  Substance Use Topics  . Smoking status: Former Smoker -- 0.50 packs/day for 10 years    Types: Cigarettes  . Smokeless tobacco: Never Used     Comment: 'quit smoking in ~ 2014"  . Alcohol Use: Yes     Comment: 10/25/2014 "might have a drink a couple times/yr"    History reviewed. No pertinent family history. No Known Allergies  OBJECTIVE: Blood pressure 145/96, pulse 113,  temperature 98.3 F (36.8 C), temperature source Oral, resp. rate 16, height 5\' 7"  (1.702 m), weight 167 lb 1.7 oz (75.8 kg), SpO2 97 %. General: he looks a little more comfortable today. Abdomen: Soft but mild diffuse tenderness. Drain output not recorded today. Only a small amount of brown fluid and drain bulb currently.  Lab Results Lab Results  Component Value Date   WBC 10.7* 10/31/2014   HGB 7.7* 10/31/2014   HCT 24.3* 10/31/2014   MCV 92.7 10/31/2014   PLT 805* 10/31/2014    Lab Results  Component Value Date   CREATININE 0.80 10/31/2014   BUN <5* 10/31/2014   NA 131* 10/31/2014   K 4.0 10/31/2014   CL 94* 10/31/2014   CO2 30 10/31/2014    Lab Results  Component Value Date   ALT 6 10/28/2014   AST 10 10/28/2014   ALKPHOS 65 10/28/2014   BILITOT 0.5 10/28/2014     Microbiology: Recent Results (from the past 240 hour(s))  Culture, blood (routine x 2)     Status: None (Preliminary result)   Collection Time: 10/26/14 10:13 PM  Result Value Ref Range Status   Specimen Description BLOOD LEFT ARM  Final   Special Requests BOTTLES DRAWN AEROBIC AND  ANAEROBIC 5CC  Final   Culture   Final           BLOOD CULTURE RECEIVED NO GROWTH TO DATE CULTURE WILL BE HELD FOR 5 DAYS BEFORE ISSUING A FINAL NEGATIVE REPORT Performed at [facility]    Report Status PENDING  Incomplete  Culture, blood (routine x 2)     Status: None (Preliminary result)   Collection Time: 10/26/14 10:15 PM  Result Value Ref Range Status   Specimen Description BLOOD LEFT HAND  Final   Special Requests   Final    BOTTLES DRAWN AEROBIC AND ANAEROBIC [place] BLUE 3CC PURPLE   Culture   Final           BLOOD CULTURE RECEIVED NO GROWTH TO DATE CULTURE WILL BE HELD FOR 5 DAYS BEFORE ISSUING A FINAL NEGATIVE REPORT Performed at [facility]    Report Status PENDING  Incomplete  Anaerobic culture     Status: None (Preliminary result)   Collection Time: 10/27/14  5:56 PM  Result Value Ref Range  Status   Specimen Description ABSCESS ABDOMEN  Final   Special Requests NONE  Final   Gram Stain PENDING  Incomplete   Culture   Final    NO ANAEROBES ISOLATED; CULTURE IN PROGRESS FOR 5 DAYS Performed at [facility]    Report Status PENDING  Incomplete  Culture, routine-abscess     Status: None   Collection Time: 10/27/14  5:56 PM  Result Value Ref Range Status   Specimen Description ABSCESS ABDOMEN  Final   Special Requests NONE  Final   Gram Stain   Final    MODERATE WBC PRESENT,BOTH PMN AND MONONUCLEAR NO SQUAMOUS EPITHELIAL CELLS SEEN MODERATE GRAM POSITIVE COCCI IN PAIRS IN CHAINS IN CLUSTERS FEW GRAM NEGATIVE RODS Performed at [facility]    Culture   Final    ABUNDANT CITROBACTER FREUNDII ABUNDANT KLEBSIELLA OXYTOCA Performed at [facility]    Report Status 10/31/2014 FINAL  Final   Organism ID, Bacteria CITROBACTER FREUNDII  Final   Organism ID, Bacteria KLEBSIELLA OXYTOCA  Final      Susceptibility   Citrobacter freundii - MIC*    CEFAZOLIN >=64 RESISTANT Resistant     CEFEPIME <=1 SENSITIVE Sensitive     CEFTAZIDIME <=1 SENSITIVE Sensitive     CEFTRIAXONE <=1 SENSITIVE Sensitive     CIPROFLOXACIN <=0.25 SENSITIVE Sensitive     GENTAMICIN <=1 SENSITIVE Sensitive     IMIPENEM <=0.25 SENSITIVE Sensitive     PIP/TAZO <=4 SENSITIVE Sensitive     TOBRAMYCIN <=1 SENSITIVE Sensitive     TRIMETH/SULFA <=20 SENSITIVE Sensitive     * ABUNDANT CITROBACTER FREUNDII   Klebsiella oxytoca - MIC*    AMPICILLIN RESISTANT      AMPICILLIN/SULBACTAM 4 SENSITIVE Sensitive     CEFAZOLIN <=4 SENSITIVE Sensitive     CEFEPIME <=1 SENSITIVE Sensitive     CEFTAZIDIME <=1 SENSITIVE Sensitive     CEFTRIAXONE <=1 SENSITIVE Sensitive     CIPROFLOXACIN <=0.25 SENSITIVE Sensitive     GENTAMICIN <=1 SENSITIVE Sensitive     IMIPENEM <=0.25 SENSITIVE Sensitive     PIP/TAZO <=4 SENSITIVE Sensitive     TOBRAMYCIN <=1 SENSITIVE Sensitive     TRIMETH/SULFA <=20  SENSITIVE Sensitive     * ABUNDANT KLEBSIELLA OXYTOCA  Clostridium Difficile by PCR     Status: Abnormal   Collection Time: 10/29/14  9:58 AM  Result Value Ref Range Status   C difficile by  pcr POSITIVE (A) NEGATIVE Final    Comment: CRITICAL RESULT CALLED TO, READ BACK BY AND VERIFIED WITH: A.FORD,RN 10/30/14 @1420  BY V.WILKINS     Assessment: He is a little bit better on therapy for his polymicrobial abscess. I will narrow his antibiotic therapy to IV ceftriaxone and metronidazole. The metronidazole can do double duty as treatment for anaerobes in the abscess and an adjunctive therapy for C. difficile colitis. I will also continue his oral vancomycin.  Plan: 1. Change piperacillin tazobactam to IV ceftriaxone and metronidazole 2. Continue oral vancomycin  Michel Bickers, MD [facility] [phone] pager   [phone] cell 10/31/2014, 6:39 PM

## 2014-11-01 LAB — CBC
HEMATOCRIT: 22.5 % — AB (ref 39.0–52.0)
Hemoglobin: 7.2 g/dL — ABNORMAL LOW (ref 13.0–17.0)
MCH: 29.9 pg (ref 26.0–34.0)
MCHC: 32 g/dL (ref 30.0–36.0)
MCV: 93.4 fL (ref 78.0–100.0)
Platelets: 674 10*3/uL — ABNORMAL HIGH (ref 150–400)
RBC: 2.41 MIL/uL — AB (ref 4.22–5.81)
RDW: 16.4 % — ABNORMAL HIGH (ref 11.5–15.5)
WBC: 9.4 10*3/uL (ref 4.0–10.5)

## 2014-11-01 LAB — DIFFERENTIAL
Basophils Absolute: 0 10*3/uL (ref 0.0–0.1)
Basophils Relative: 0 % (ref 0–1)
EOS PCT: 2 % (ref 0–5)
Eosinophils Absolute: 0.2 10*3/uL (ref 0.0–0.7)
LYMPHS PCT: 26 % (ref 12–46)
Lymphs Abs: 2.4 10*3/uL (ref 0.7–4.0)
MONO ABS: 0.7 10*3/uL (ref 0.1–1.0)
Monocytes Relative: 7 % (ref 3–12)
NEUTROS PCT: 65 % (ref 43–77)
Neutro Abs: 6.1 10*3/uL (ref 1.7–7.7)
SMEAR REVIEW: INCREASED

## 2014-11-01 LAB — COMPREHENSIVE METABOLIC PANEL
ALT: 9 U/L (ref 0–53)
ANION GAP: 3 — AB (ref 5–15)
AST: 19 U/L (ref 0–37)
Albumin: 1.5 g/dL — ABNORMAL LOW (ref 3.5–5.2)
Alkaline Phosphatase: 56 U/L (ref 39–117)
BUN: <5 mg/dL — ABNORMAL LOW (ref 6–23)
CALCIUM: 7.3 mg/dL — AB (ref 8.4–10.5)
CO2: 32 mmol/L (ref 19–32)
CREATININE: 0.62 mg/dL (ref 0.50–1.35)
Chloride: 99 mmol/L (ref 96–112)
GLUCOSE: 121 mg/dL — AB (ref 70–99)
Potassium: 3.1 mmol/L — ABNORMAL LOW (ref 3.5–5.1)
Sodium: 134 mmol/L — ABNORMAL LOW (ref 135–145)
Total Bilirubin: 0.4 mg/dL (ref 0.3–1.2)
Total Protein: 4.7 g/dL — ABNORMAL LOW (ref 6.0–8.3)

## 2014-11-01 LAB — GLUCOSE, CAPILLARY
GLUCOSE-CAPILLARY: 115 mg/dL — AB (ref 70–99)
Glucose-Capillary: 121 mg/dL — ABNORMAL HIGH (ref 70–99)
Glucose-Capillary: 153 mg/dL — ABNORMAL HIGH (ref 70–99)
Glucose-Capillary: 119 mg/dL — ABNORMAL HIGH (ref 70–99)

## 2014-11-01 LAB — ANAEROBIC CULTURE

## 2014-11-01 LAB — TRIGLYCERIDES: Triglycerides: 115 mg/dL (ref <150)

## 2014-11-01 LAB — MAGNESIUM: Magnesium: 1.8 mg/dL (ref 1.5–2.5)

## 2014-11-01 LAB — PHOSPHORUS: PHOSPHORUS: 2.7 mg/dL (ref 2.3–4.6)

## 2014-11-01 MED ORDER — FAT EMULSION 20 % IV EMUL
168.0000 mL | INTRAVENOUS | Status: AC
  Administered 2014-11-01: 168 mL via INTRAVENOUS
  Filled 2014-11-01: qty 200

## 2014-11-01 MED ORDER — POTASSIUM CHLORIDE 10 MEQ/50ML IV SOLN
10.0000 meq | INTRAVENOUS | Status: AC
  Administered 2014-11-01 (×4): 10 meq via INTRAVENOUS
  Filled 2014-11-01 (×4): qty 50

## 2014-11-01 MED ORDER — LORAZEPAM 2 MG/ML IJ SOLN
1.0000 mg | Freq: Two times a day (BID) | INTRAMUSCULAR | Status: DC | PRN
  Administered 2014-11-01 – 2014-11-07 (×9): 1 mg via INTRAVENOUS
  Filled 2014-11-01 (×9): qty 1

## 2014-11-01 MED ORDER — MAGNESIUM SULFATE IN D5W 10-5 MG/ML-% IV SOLN
1.0000 g | Freq: Once | INTRAVENOUS | Status: AC
  Administered 2014-11-01: 1 g via INTRAVENOUS
  Filled 2014-11-01: qty 100

## 2014-11-01 MED ORDER — TRACE MINERALS CR-CU-F-FE-I-MN-MO-SE-ZN IV SOLN
INTRAVENOUS | Status: AC
  Administered 2014-11-01: 18:00:00 via INTRAVENOUS
  Filled 2014-11-01: qty 960

## 2014-11-01 MED ORDER — SODIUM CHLORIDE 0.9 % IV SOLN
INTRAVENOUS | Status: AC
  Administered 2014-11-01 – 2014-11-02 (×2): via INTRAVENOUS
  Filled 2014-11-01 (×4): qty 1000

## 2014-11-01 NOTE — Progress Notes (Signed)
Subjective: Pt sleeping.  Objective: Vital signs in last 24 hours: Temp:  [97.7 F (36.5 C)-99.5 F (37.5 C)] 98.5 F (36.9 C) (03/23 1223) Pulse Rate:  [87-113] 104 (03/23 1223) Resp:  [12-20] 16 (03/23 1223) BP: (112-145)/(79-96) 128/80 mmHg (03/23 1223) SpO2:  [93 %-99 %] 96 % (03/23 1223) Weight:  [73.8 kg (162 lb 11.2 oz)] 73.8 kg (162 lb 11.2 oz) (03/23 0447) Last BM Date: 10/31/14  Intake/Output from previous day: 03/22 0701 - 03/23 0700 In: 305 [I.V.:100; IV Piggyback:200] Out: 2200 [Urine:2000; Emesis/NG output:200] Intake/Output this shift: Total I/O In: 0  Out: 525 [Urine:525]  General appearance: sleeping soundly  Lab Results:  Recent Labs  10/30/14 0615 10/31/14 0640 11/01/14 0534  WBC 10.7* 10.7* 9.4  HGB 8.0* 7.7* 7.2*  HCT 25.0* 24.3* 22.5*  PLT 752* 805* 674*   BMET  Recent Labs  10/30/14 0615 10/31/14 0640 11/01/14 0534  NA 131* 131* 134*  K 3.8 4.0 3.1*  CL 95* 94* 99  CO2 28 30 32  GLUCOSE 91 90 121*  BUN <5* <5* <5*  CREATININE 0.81 0.80 0.62  CALCIUM 7.6* 7.3* 7.3*   LFT  Recent Labs  11/01/14 0534  PROT 4.7*  ALBUMIN 1.5*  AST 19  ALT 9  ALKPHOS 56  BILITOT 0.4   PT/INR No results for input(s): LABPROT, INR in the last 72 hours. Hepatitis Panel No results for input(s): HEPBSAG, HCVAB, HEPAIGM, HEPBIGM in the last 72 hours. C-Diff No results for input(s): CDIFFTOX in the last 72 hours. Fecal Lactopherrin No results for input(s): FECLLACTOFRN in the last 72 hours.  Studies/Results: No results found.  Medications:  Scheduled: . antiseptic oral rinse  7 mL Mouth Rinse q12n4p  . cefTRIAXone (ROCEPHIN)  IV  1 g Intravenous Q24H  . chlorhexidine  15 mL Mouth Rinse BID  . fluconazole (DIFLUCAN) IV  100 mg Intravenous Q24H  . heparin  5,000 Units Subcutaneous 3 times per day  . HYDROmorphone PCA 0.3 mg/mL   Intravenous 6 times per day  . insulin aspart  0-9 Units Subcutaneous 4 times per day  . magic mouthwash  10  mL Oral QID  . metronidazole  500 mg Intravenous Q8H  . morphine  30 mg Oral Q12H  . pantoprazole (PROTONIX) IV  40 mg Intravenous Q24H  . potassium chloride  10 mEq Intravenous Q1 Hr x 4  . vancomycin  125 mg Oral 4 times per day   Continuous: . Marland KitchenTPN (CLINIMIX-E) Adult 30 mL/hr at 10/31/14 1755   And  . fat emulsion 120 mL (10/31/14 1755)  . Marland KitchenTPN (CLINIMIX-E) Adult     And  . fat emulsion    . 0.9 % sodium chloride with kcl 70 mL/hr at 11/01/14 1214    Assessment/Plan: 1) Sigmoid colon abscess. 2) Crohn's disease. 3) Malnutrition.   The patient is sleeping, however, the notes reflect that he feels much better with the additional drainage of the abscess.  I confirmed the improvement with Nursing.  This is a welcomed positive news.  Additionally, his antibiotic regimen has been tailored to be more focused.  Maybe there is a possibility that surgical intervention can be avoided.  Plan: 1) Continue with Vanc, Flagyl, and Rocephin. 2) Continue with drainage/flushes. 3) Continue with TPN.   LOS: 6 days   Parlee Amescua D 11/01/2014, 12:56 PM

## 2014-11-01 NOTE — Progress Notes (Signed)
PARENTERAL NUTRITION CONSULT NOTE -follow up Pharmacy Consult:  TPN Indication:  Sigmoid abscess with high likelihood of fistula formation  No Known Allergies  Patient Measurements: Height: 5\' 7"  (170.2 cm) Weight: 162 lb 11.2 oz (73.8 kg) IBW/kg (Calculated) : 66.1  Usual weight = 96 kg Current weight = 76 kg  Vital Signs: Temp: 97.7 F (36.5 C) (03/23 0447) Temp Source: Oral (03/23 0447) BP: 118/83 mmHg (03/23 0447) Pulse Rate: 87 (03/23 0447) Intake/Output from previous day: 03/22 0701 - 03/23 0700 In: 305 [I.V.:100; IV Piggyback:200] Out: 2200 [Urine:2000; Emesis/NG output:200] Intake/Output from this shift:    Labs:  Recent Labs  10/30/14 0615 10/31/14 0640 11/01/14 0534  WBC 10.7* 10.7* 9.4  HGB 8.0* 7.7* 7.2*  HCT 25.0* 24.3* 22.5*  PLT 752* 805* 674*     Recent Labs  10/29/14 1034 10/30/14 0615 10/31/14 0640 11/01/14 0534  NA  --  131* 131* 134*  K  --  3.8 4.0 3.1*  CL  --  95* 94* 99  CO2  --  28 30 32  GLUCOSE  --  91 90 121*  BUN  --  <5* <5* <5*  CREATININE  --  0.81 0.80 0.62  CALCIUM  --  7.6* 7.3* 7.3*  MG  --   --  1.9 1.8  PHOS  --   --  2.7 2.7  PROT  --   --   --  4.7*  ALBUMIN  --   --   --  1.5*  AST  --   --   --  19  ALT  --   --   --  9  ALKPHOS  --   --   --  56  BILITOT  --   --   --  0.4  PREALBUMIN 4.3*  --   --   --   TRIG  --   --   --  115   Estimated Creatinine Clearance: 112.5 mL/min (by C-G formula based on Cr of 0.62).    Recent Labs  10/31/14 1854 11/01/14 0001 11/01/14 0559  GLUCAP 93 115* 121*    Medical History: Past Medical History  Diagnosis Date  . Crohn disease   . C. difficile colitis 09/30/2014  . Ankylosing spondylitis 09/30/2014  . Anxiety      Insulin Requirements in the past 24 hours:  1 unit SSI for cbg 121  Assessment: 26 YOM with history of Crohn's and recent C.diff colitis presented on 10/26/14 with worsening abdominal pain.  CT showed sigmoid abscess and is s/p drain placement  on 10/27/14 by IR.  Medical team suspects fistulous connection from colon to abscess cavity.  Given likelihood of surgery, high suspicion of fistula and poor nutritional status (baseline prealbumin at 4.3 and significant weight loss), Pharmacy consulted to initiate TPN.  GI: Crohn's on Humira, Imuran, prednisone PTA.  Intra-abd abscess drain yielded feculent material.  Prealbumin 4.3, albumin 1.5, BM x2 Endo: no hx DM - CBGs 93, 115, 121 with TPN at 30 ml/hr Lytes: Na 134. K 3.1, phos 2.7, mag 1.8 Renal: SCr stable, CrCL 113 ml/min -  UOP 1/1 ml/kg/hr, NS30K at 70  ml/hr Pulm: currently on RA Cards: no hx -  Hepatobil: WNL. LFTs WNL, Trig 115 Neuro: hx anxiety and ankylosing spondylitis - Dilaudid PCA + MS Contin, receiving PRN Zofran ID: Diflucan, Zosyn changed to ceftriaxone and flagyl for intra-abd abscess + PO Vanc for C.diff - afebrile, WBC at 9.4 Best Practices: heparin SQ, MC  TPN Access: PICC to be placed 3/22 prior to TPN initiation TPN day#: 1 (3/22 >> )  Current Nutrition:  NPO; Clinimix E 5/15 at 30 ml/hr + IVFE at 5 ml/hr Anticipated goal: Clinimix E 5/15 at 100 ml/hr + IVFE at 7 ml/hr = 2040 kCal and 120gm of protein per day  Nutritional Goals:  1900-2100 kCal, 100-120 grams of protein per day   Plan:  -replete lytes with mag 1 gm and 4 runs of K - increase Clinimix E 5/15 to 40 ml/hr + IVFE at 7 ml/hr to provide 48 gm protein and 1018 Kcals.   - Daily multivitamin and trace elements - continue sensitive SSI Q6H.  D/C if CBGs remain controlled at goal TPN rate. - Decrease IVF to 60 ml/hr  - Standard TPN labs and orders  Eudelia Bunch, Pharm.D. [phone] 11/01/2014 8:14 AM

## 2014-11-01 NOTE — Progress Notes (Signed)
Patient ID: Vincent Black, male   DOB: 12-14-1970, 44 y.o.   MRN: [id number]         [facility]    Date of Admission:  10/26/2014           Day 7 IV antibiotics (currently on piperacillin tazobactam)        Day 7 oral vancomycin  Principal Problem:   Pericolonic abscess Active Problems:   History of Clostridium difficile colitis   Odynophagia   Crohn's disease   Protein-calorie malnutrition, severe   Ankylosing spondylitis   Unintentional weight loss   Normocytic anemia   Left lower quadrant pain   . antiseptic oral rinse  7 mL Mouth Rinse q12n4p  . cefTRIAXone (ROCEPHIN)  IV  1 g Intravenous Q24H  . chlorhexidine  15 mL Mouth Rinse BID  . fluconazole (DIFLUCAN) IV  100 mg Intravenous Q24H  . heparin  5,000 Units Subcutaneous 3 times per day  . HYDROmorphone PCA 0.3 mg/mL   Intravenous 6 times per day  . insulin aspart  0-9 Units Subcutaneous 4 times per day  . magic mouthwash  10 mL Oral QID  . metronidazole  500 mg Intravenous Q8H  . morphine  30 mg Oral Q12H  . pantoprazole (PROTONIX) IV  40 mg Intravenous Q24H  . potassium chloride  10 mEq Intravenous Q1 Hr x 4  . vancomycin  125 mg Oral 4 times per day    Subjective: His wife reports that he had one episode of nausea and vomiting last night. She states that he is feeling better today with less abdominal pain. He had one normal bowel movement this morning.  Review of Systems: Pertinent items are noted in HPI.  Past Medical History  Diagnosis Date  . Crohn disease   . C. difficile colitis 09/30/2014  . Ankylosing spondylitis 09/30/2014  . Anxiety     History  Substance Use Topics  . Smoking status: Former Smoker -- 0.50 packs/day for 10 years    Types: Cigarettes  . Smokeless tobacco: Never Used     Comment: 'quit smoking in ~ 2014"  . Alcohol Use: Yes     Comment: 10/25/2014 "might have a drink a couple times/yr"    History reviewed. No pertinent family history. No Known  Allergies  OBJECTIVE: Blood pressure 128/80, pulse 104, temperature 98.5 F (36.9 C), temperature source Oral, resp. rate 16, height 5\' 7"  (1.702 m), weight 162 lb 11.2 oz (73.8 kg), SpO2 96 %. General: he is sound asleep Abdomen: there is a small amount of brown fluid in his drain  Lab Results Lab Results  Component Value Date   WBC 9.4 11/01/2014   HGB 7.2* 11/01/2014   HCT 22.5* 11/01/2014   MCV 93.4 11/01/2014   PLT 674* 11/01/2014    Lab Results  Component Value Date   CREATININE 0.62 11/01/2014   BUN <5* 11/01/2014   NA 134* 11/01/2014   K 3.1* 11/01/2014   CL 99 11/01/2014   CO2 32 11/01/2014    Lab Results  Component Value Date   ALT 9 11/01/2014   AST 19 11/01/2014   ALKPHOS 56 11/01/2014   BILITOT 0.4 11/01/2014     Microbiology: Recent Results (from the past 240 hour(s))  Culture, blood (routine x 2)     Status: None (Preliminary result)   Collection Time: 10/26/14 10:13 PM  Result Value Ref Range Status   Specimen Description BLOOD LEFT ARM  Final   Special Requests  BOTTLES DRAWN AEROBIC AND ANAEROBIC 5CC  Final   Culture   Final           BLOOD CULTURE RECEIVED NO GROWTH TO DATE CULTURE WILL BE HELD FOR 5 DAYS BEFORE ISSUING A FINAL NEGATIVE REPORT Performed at [facility]    Report Status PENDING  Incomplete  Culture, blood (routine x 2)     Status: None (Preliminary result)   Collection Time: 10/26/14 10:15 PM  Result Value Ref Range Status   Specimen Description BLOOD LEFT HAND  Final   Special Requests   Final    BOTTLES DRAWN AEROBIC AND ANAEROBIC [place] BLUE 3CC PURPLE   Culture   Final           BLOOD CULTURE RECEIVED NO GROWTH TO DATE CULTURE WILL BE HELD FOR 5 DAYS BEFORE ISSUING A FINAL NEGATIVE REPORT Performed at [facility]    Report Status PENDING  Incomplete  Anaerobic culture     Status: None   Collection Time: 10/27/14  5:56 PM  Result Value Ref Range Status   Specimen Description ABSCESS ABDOMEN  Final    Special Requests NONE  Final   Gram Stain   Final    MODERATE WBC PRESENT,BOTH PMN AND MONONUCLEAR NO SQUAMOUS EPITHELIAL CELLS SEEN MODERATE GRAM POSITIVE COCCI IN PAIRS IN CHAINS IN CLUSTERS FEW GRAM NEGATIVE RODS Performed at [facility]    Culture   Final    NO ANAEROBES ISOLATED Performed at [facility]    Report Status 11/01/2014 FINAL  Final  Culture, routine-abscess     Status: None   Collection Time: 10/27/14  5:56 PM  Result Value Ref Range Status   Specimen Description ABSCESS ABDOMEN  Final   Special Requests NONE  Final   Gram Stain   Final    MODERATE WBC PRESENT,BOTH PMN AND MONONUCLEAR NO SQUAMOUS EPITHELIAL CELLS SEEN MODERATE GRAM POSITIVE COCCI IN PAIRS IN CHAINS IN CLUSTERS FEW GRAM NEGATIVE RODS Performed at [facility]    Culture   Final    ABUNDANT CITROBACTER FREUNDII ABUNDANT KLEBSIELLA OXYTOCA Performed at [facility]    Report Status 10/31/2014 FINAL  Final   Organism ID, Bacteria CITROBACTER FREUNDII  Final   Organism ID, Bacteria KLEBSIELLA OXYTOCA  Final      Susceptibility   Citrobacter freundii - MIC*    CEFAZOLIN >=64 RESISTANT Resistant     CEFEPIME <=1 SENSITIVE Sensitive     CEFTAZIDIME <=1 SENSITIVE Sensitive     CEFTRIAXONE <=1 SENSITIVE Sensitive     CIPROFLOXACIN <=0.25 SENSITIVE Sensitive     GENTAMICIN <=1 SENSITIVE Sensitive     IMIPENEM <=0.25 SENSITIVE Sensitive     PIP/TAZO <=4 SENSITIVE Sensitive     TOBRAMYCIN <=1 SENSITIVE Sensitive     TRIMETH/SULFA <=20 SENSITIVE Sensitive     * ABUNDANT CITROBACTER FREUNDII   Klebsiella oxytoca - MIC*    AMPICILLIN RESISTANT      AMPICILLIN/SULBACTAM 4 SENSITIVE Sensitive     CEFAZOLIN <=4 SENSITIVE Sensitive     CEFEPIME <=1 SENSITIVE Sensitive     CEFTAZIDIME <=1 SENSITIVE Sensitive     CEFTRIAXONE <=1 SENSITIVE Sensitive     CIPROFLOXACIN <=0.25 SENSITIVE Sensitive     GENTAMICIN <=1 SENSITIVE Sensitive     IMIPENEM <=0.25 SENSITIVE  Sensitive     PIP/TAZO <=4 SENSITIVE Sensitive     TOBRAMYCIN <=1 SENSITIVE Sensitive     TRIMETH/SULFA <=20 SENSITIVE Sensitive     * ABUNDANT KLEBSIELLA  OXYTOCA  Clostridium Difficile by PCR     Status: Abnormal   Collection Time: 10/29/14  9:58 AM  Result Value Ref Range Status   C difficile by pcr POSITIVE (A) NEGATIVE Final    Comment: CRITICAL RESULT CALLED TO, READ BACK BY AND VERIFIED WITH: A.FORD,RN 10/30/14 @1420  BY V.WILKINS     Assessment: He is showing some improvement on therapy for pericolonic abscess.  Plan: 1.  continue current antibiotics  Michel Bickers, MD [facility] [phone] pager   [phone] cell 11/01/2014, 2:58 PM

## 2014-11-01 NOTE — Progress Notes (Signed)
Patient ID: Vincent Black, male   DOB: 1971/01/06, 44 y.o.   MRN: [id number]    Subjective: Pt feels much better today.  His drain got flushed yesterday and started to drain more yellow clear drainage.  This apparently relieved a lot of pressure and his pain is much better.  He has essentially been able to stop using his PCA now.  Objective: Vital signs in last 24 hours: Temp:  [97.7 F (36.5 C)-99.5 F (37.5 C)] 99.3 F (37.4 C) (03/23 0823) Pulse Rate:  [87-113] 92 (03/23 0823) Resp:  [12-20] 16 (03/23 0823) BP: (112-145)/(79-96) 112/79 mmHg (03/23 0823) SpO2:  [95 %-99 %] 98 % (03/23 0823) Weight:  [73.8 kg (162 lb 11.2 oz)] 73.8 kg (162 lb 11.2 oz) (03/23 0447) Last BM Date: 10/31/14  Intake/Output from previous day: 03/22 0701 - 03/23 0700 In: 305 [I.V.:100; IV Piggyback:200] Out: 2200 [Urine:2000; Emesis/NG output:200] Intake/Output this shift:    PE: Abd: soft, much less tender, but still some in LLQ, drain with more cloudy yellow output today instead of feculent looking drainage. Heart: regular Lungs: CTAB  Lab Results:   Recent Labs  10/31/14 0640 11/01/14 0534  WBC 10.7* 9.4  HGB 7.7* 7.2*  HCT 24.3* 22.5*  PLT 805* 674*   BMET  Recent Labs  10/31/14 0640 11/01/14 0534  NA 131* 134*  K 4.0 3.1*  CL 94* 99  CO2 30 32  GLUCOSE 90 121*  BUN <5* <5*  CREATININE 0.80 0.62  CALCIUM 7.3* 7.3*   PT/INR No results for input(s): LABPROT, INR in the last 72 hours. CMP     Component Value Date/Time   NA 134* 11/01/2014 0534   K 3.1* 11/01/2014 0534   CL 99 11/01/2014 0534   CO2 32 11/01/2014 0534   GLUCOSE 121* 11/01/2014 0534   BUN <5* 11/01/2014 0534   CREATININE 0.62 11/01/2014 0534   CALCIUM 7.3* 11/01/2014 0534   PROT 4.7* 11/01/2014 0534   ALBUMIN 1.5* 11/01/2014 0534   AST 19 11/01/2014 0534   ALT 9 11/01/2014 0534   ALKPHOS 56 11/01/2014 0534   BILITOT 0.4 11/01/2014 0534   GFRNONAA >90 11/01/2014 0534   GFRAA >90 11/01/2014 0534    Lipase  No results found for: LIPASE     Studies/Results: No results found.  Anti-infectives: Anti-infectives    Start     Dose/Rate Route Frequency Ordered Stop   10/31/14 2000  cefTRIAXone (ROCEPHIN) 1 g in dextrose 5 % 50 mL IVPB - Premix     1 g 100 mL/hr over 30 Minutes Intravenous Every 24 hours 10/31/14 1855     10/31/14 2000  metroNIDAZOLE (FLAGYL) IVPB 500 mg     500 mg 100 mL/hr over 60 Minutes Intravenous Every 8 hours 10/31/14 1855     10/28/14 1200  piperacillin-tazobactam (ZOSYN) IVPB 3.375 g  Status:  Discontinued     3.375 g 12.5 mL/hr over 240 Minutes Intravenous Every 8 hours 10/28/14 1021 10/31/14 1855   10/26/14 2200  metroNIDAZOLE (FLAGYL) IVPB 500 mg  Status:  Discontinued     500 mg 100 mL/hr over 60 Minutes Intravenous Every 8 hours 10/26/14 2015 10/28/14 0943   10/26/14 2100  ciprofloxacin (CIPRO) IVPB 400 mg  Status:  Discontinued     400 mg 200 mL/hr over 60 Minutes Intravenous Every 12 hours 10/26/14 2027 10/28/14 1021   10/26/14 1900  fluconazole (DIFLUCAN) IVPB 100 mg     100 mg 50 mL/hr over 60 Minutes Intravenous Every  24 hours 10/26/14 1805     10/26/14 1800  vancomycin (VANCOCIN) 50 mg/mL oral solution 125 mg     125 mg Oral 4 times per day 10/26/14 1655         Assessment/Plan   Intra-abdominal abscess with crohn's disease -Patient switched back to Rocephin and Flagyl yesterday by ID, along with oral vancomycin -IR placed a perc drain 10/27/14. The patient may have a fistulous connection from his colon to this abscess cavity, although he currently has minimal output, and is now changed in caliber. CXs show Citrobacter freundii & Klebsiella oxytoca sensitive to Rocephin and Flagyl - Gram positive cocci in pairs/chains/clusters, few gram negatives -TPN/PICC for protein calorie malnutrition -can try clear liquids today. -WBC stable at 9.4 today. Continue to monitor with conservative management.  -would try to avoid surgery in this  patient right now as much as possible unless he deteriorated Hypoalbuminemia/low prealbumin - Starting TPN -Albumin is 1.5. Patient states he was eating great prior to admission; however, this is very low for a 44 yo male. Prealbumin is 4.3.  Insert Picc, start TPN given malnourished state currently, potentially surgery if not improving. Recurrent C.diff - stool still positive 10/29/14 -Oral Vanc, ID following Pyrosis -Start PPI   LOS: 6 days    Hallel Denherder E 11/01/2014, 9:19 AM Pager: [phone]

## 2014-11-01 NOTE — Progress Notes (Signed)
Patient ID: Vincent Black, male   DOB: 07-03-1971, 44 y.o.   MRN: [id number]  [facility] PROGRESS NOTE  AJAX SCHROLL XIH:[id number] DOB: [birth year] DOA: 10/26/2014 PCP: No PCP Per Patient   Brief narrative:    44 y.o. male with Crohn's disease, C. difficile colitis presented with abd pain, fever, chills, poor oral intake several days in duration.   Assessment/Plan:    Principal Problem:   Sepsis secondary to severe diverticulitis and intra abd abscess - criteria for sepsis met on admission with T 102.2, HR 110, BP 110/73, WBC 14.6 - placed on ABX: Cipro and Flagyl IV but due to extent of the illness, surgery recommended changing ABX to Zosyn 3/19 - cultures positive for Citrobacter and Klebsiella, ABX changed to Rocephin and Flagyl 3/22 based on culture reports  - WBC is trending: 14.6 --> 11.9 --> 11.8 --> 12.8 --> 10.7 --> 10.7 --> 9.4 - pt reports feeling better this AM and rates pain 3/10   Intra-abdominal abscess - appreciate IR following and assisting - IR placed a perc drain 3/18 that yielded 50cc of feculent material, cultures positive for Citrobacter and Klebsiella  - ABX as noted above  - tolerating TPN well for now - plan to try clear liquids today if pt able to tolerate    Severe diverticulitis - ABX as noted above Active Problems:   Recent C. Diff - C. Diff from 3/20 positive - continue oral vancomycin    Crohn's disease - appreciate GI team following    Normocytic anemia, secondary to Crohn's disease - appears that Hg at baseline typically ~8 - likely dilutional component from IVF pt has been receiving  - no signs of active bleeding, if Hg < 7 will plan on transfusing  - repeat CBC in AM   Hyponatremia - from pre renal etiology and dehydration in the setting of sepsis - Na stabilizing    Oral thrush - continue Fluconazole  - added magic mouthwash and pt reports he has less odynophagia    Moderate PCM - in the context of acute illness - TPN to be  started today    Hypokalemia - keep K > 4 - continue to supplement, Mg is WNL    Thrombocytosis - Plt trending down, will repeat CBC in AM  DVT prophylaxis - Heparin SQ  Code Status: Full.  Family Communication:  plan of care discussed with the patient and wife at bedside, father at bedside  Disposition Plan: Home when stable.   IV access:  Peripheral IV  Procedures and diagnostic studies:    Ct Abdomen Pelvis W Contrast  10/26/2014    Severe colitis or diverticulitis involving the proximal sigmoid colon, with large pericolonic abscess containing stool measuring approximately 6 x 8 cm. Background  Mild diffuse colitis also noted, without other areas of colonic perforation or abscess.  No evidence of free intraperitoneal air.    Medical Consultants:  GI - Dr. Benson Norway IR ID Surgery   Other Consultants:  None  IAnti-Infectives:   Ciprofloxacin IV 3/17 --> 3/19 Metronidazole IV 3/17 --> 3/19, restarted 3/22 --> Rocephin 3/22 --> Zosyn 3/19 --> 3/22 Vancomycin PO 3/17 --> Fluconazole IV 3/17 -->  Faye Ramsay, MD  [facility] Pager [phone]   If 7PM-7AM, please contact night-coverage [email or web address] Password [facility] 11/01/2014, 11:35 AM   LOS: 6 days   HPI/Subjective: No events overnight.   Objective: Filed Vitals:   11/01/14 0400 11/01/14 0447 11/01/14 0800 11/01/14 0823  BP:  118/83  112/79  Pulse:  87  92  Temp:  97.7 F (36.5 C)  99.3 F (37.4 C)  TempSrc:  Oral  Oral  Resp: 12 12 18 16   Height:      Weight:  73.8 kg (162 lb 11.2 oz)    SpO2: 96% 99% 94% 98%    Intake/Output Summary (Last 24 hours) at 11/01/14 1135 Last data filed at 11/01/14 0909  Gross per 24 hour  Intake    200 ml  Output   2725 ml  Net  -2525 ml    Exam:   General:  Pt is alert, follows commands appropriately, not in acute distress  Cardiovascular: Regular rhythm, tachycardic, S1/S2, no murmurs, no rubs, no gallops  Respiratory: Clear to auscultation bilaterally, no wheezing, no  crackles, no rhonchi  Abdomen: Soft, less tender in epigastric area, non distended, drain in place with minimal drainage   Extremities: No edema, pulses DP and PT palpable bilaterally  Neuro: Grossly nonfocal  Data Reviewed: Basic Metabolic Panel:  Recent Labs Lab 10/28/14 0243 10/29/14 0500 10/29/14 0605 10/30/14 0615 10/31/14 0640 11/01/14 0534  NA 130*  --  129* 131* 131* 134*  K 3.5  --  3.2* 3.8 4.0 3.1*  CL 98  --  94* 95* 94* 99  CO2 23  --  26 28 30  32  GLUCOSE 84  --  85 91 90 121*  BUN <5*  --  <5* <5* <5* <5*  CREATININE 0.88  --  0.82 0.81 0.80 0.62  CALCIUM 7.4*  --  7.6* 7.6* 7.3* 7.3*  MG  --  1.8  --   --  1.9 1.8  PHOS  --   --   --   --  2.7 2.7   Liver Function Tests:  Recent Labs Lab 10/26/14 1825 10/27/14 0630 10/28/14 0243 11/01/14 0534  AST 17 10 10 19   ALT 9 7 6 9   ALKPHOS 81 68 65 56  BILITOT 0.8 0.3 0.5 0.4  PROT 6.1 5.3* 4.7* 4.7*  ALBUMIN 2.0* 1.7* 1.5* 1.5*   CBC:  Recent Labs Lab 10/28/14 0243 10/29/14 0605 10/30/14 0615 10/31/14 0640 11/01/14 0534  WBC 11.8* 12.5* 10.7* 10.7* 9.4  NEUTROABS  --   --   --   --  6.1  HGB 7.8* 8.5* 8.0* 7.7* 7.2*  HCT 24.3* 26.4* 25.0* 24.3* 22.5*  MCV 92.4 93.0 92.6 92.7 93.4  PLT 704* 743* 752* 805* 674*   Scheduled Meds: . fluconazole  IV  100 mg Intravenous Q24H  . heparin  5,000 Units Subcutaneous 3 times per day  . magic mouthwash  10 mL Oral QID  . morphine  30 mg Oral Q12H  . ZOSYN  IV  3.375 g Intravenous Q8H  . vancomycin  125 mg Oral 4 times per day    Continuous Infusions: . Marland KitchenTPN (CLINIMIX-E) Adult 30 mL/hr at 10/31/14 1755   And  . fat emulsion 120 mL (10/31/14 1755)  . Marland KitchenTPN (CLINIMIX-E) Adult     And  . fat emulsion    . 0.9 % sodium chloride with kcl

## 2014-11-01 NOTE — Progress Notes (Signed)
Subjective: Pt ok, feeling a bit better each day. S/p perc drain 3/18  Objective: Physical Exam: BP 112/79 mmHg  Pulse 92  Temp(Src) 99.3 F (37.4 C) (Oral)  Resp 16  Ht 5\' 7"  (1.702 m)  Wt 162 lb 11.2 oz (73.8 kg)  BMI 25.48 kg/m2  SpO2 98% LLQ drain intact, some cloudy yellow drainage coming around drain at skin, minimal erythema, no fluctuance to suggest skin abscess. Flushed with 51mL sterile saline, easy return of cloudy yellow fluid with small debris. Hooked back to bulb.   Labs: CBC  Recent Labs  10/31/14 0640 11/01/14 0534  WBC 10.7* 9.4  HGB 7.7* 7.2*  HCT 24.3* 22.5*  PLT 805* 674*   BMET  Recent Labs  10/31/14 0640 11/01/14 0534  NA 131* 134*  K 4.0 3.1*  CL 94* 99  CO2 30 32  GLUCOSE 90 121*  BUN <5* <5*  CREATININE 0.80 0.62  CALCIUM 7.3* 7.3*   LFT  Recent Labs  11/01/14 0534  PROT 4.7*  ALBUMIN 1.5*  AST 19  ALT 9  ALKPHOS 56  BILITOT 0.4   PT/INR No results for input(s): LABPROT, INR in the last 72 hours.   Studies/Results: No results found.  Assessment/Plan: Intra-abdominal abscess S/p perc drain 3/18 Continue flushes as ordered  IR following with [facility], probable repeat CT next few days    LOS: 6 days    Ascencion Dike PA-C 11/01/2014 10:59 AM

## 2014-11-02 DIAGNOSIS — R1031 Right lower quadrant pain: Secondary | ICD-10-CM

## 2014-11-02 LAB — CBC
HCT: 23.8 % — ABNORMAL LOW (ref 39.0–52.0)
Hemoglobin: 7.4 g/dL — ABNORMAL LOW (ref 13.0–17.0)
MCH: 29.2 pg (ref 26.0–34.0)
MCHC: 31.1 g/dL (ref 30.0–36.0)
MCV: 94.1 fL (ref 78.0–100.0)
Platelets: 648 10*3/uL — ABNORMAL HIGH (ref 150–400)
RBC: 2.53 MIL/uL — ABNORMAL LOW (ref 4.22–5.81)
RDW: 17 % — ABNORMAL HIGH (ref 11.5–15.5)
WBC: 10 10*3/uL (ref 4.0–10.5)

## 2014-11-02 LAB — COMPREHENSIVE METABOLIC PANEL
ALT: 9 U/L (ref 0–53)
AST: 17 U/L (ref 0–37)
Albumin: 1.6 g/dL — ABNORMAL LOW (ref 3.5–5.2)
Alkaline Phosphatase: 57 U/L (ref 39–117)
Anion gap: 5 (ref 5–15)
BUN: <5 mg/dL — ABNORMAL LOW (ref 6–23)
CO2: 26 mmol/L (ref 19–32)
Calcium: 7.3 mg/dL — ABNORMAL LOW (ref 8.4–10.5)
Chloride: 103 mmol/L (ref 96–112)
Creatinine, Ser: 0.54 mg/dL (ref 0.50–1.35)
GFR calc Af Amer: >90 mL/min (ref >90)
GFR calc non Af Amer: >90 mL/min (ref >90)
Glucose, Bld: 133 mg/dL — ABNORMAL HIGH (ref 70–99)
Potassium: 3.9 mmol/L (ref 3.5–5.1)
Sodium: 134 mmol/L — ABNORMAL LOW (ref 135–145)
Total Bilirubin: 0.4 mg/dL (ref 0.3–1.2)
Total Protein: 4.7 g/dL — ABNORMAL LOW (ref 6.0–8.3)

## 2014-11-02 LAB — GLUCOSE, CAPILLARY
GLUCOSE-CAPILLARY: 148 mg/dL — AB (ref 70–99)
Glucose-Capillary: 117 mg/dL — ABNORMAL HIGH (ref 70–99)
Glucose-Capillary: 127 mg/dL — ABNORMAL HIGH (ref 70–99)
Glucose-Capillary: 132 mg/dL — ABNORMAL HIGH (ref 70–99)

## 2014-11-02 LAB — CULTURE, BLOOD (ROUTINE X 2)
Culture: NO GROWTH
Culture: NO GROWTH

## 2014-11-02 LAB — PHOSPHORUS: Phosphorus: 2.6 mg/dL (ref 2.3–4.6)

## 2014-11-02 LAB — MAGNESIUM: MAGNESIUM: 1.9 mg/dL (ref 1.5–2.5)

## 2014-11-02 MED ORDER — FAT EMULSION 20 % IV EMUL
168.0000 mL | INTRAVENOUS | Status: DC
  Administered 2014-11-02: 168 mL via INTRAVENOUS
  Filled 2014-11-02: qty 200

## 2014-11-02 MED ORDER — BOOST / RESOURCE BREEZE PO LIQD
1.0000 | Freq: Three times a day (TID) | ORAL | Status: DC
  Administered 2014-11-02 – 2014-11-07 (×11): 1 via ORAL

## 2014-11-02 MED ORDER — TRACE MINERALS CR-CU-F-FE-I-MN-MO-SE-ZN IV SOLN
INTRAVENOUS | Status: DC
  Administered 2014-11-02: 17:00:00 via INTRAVENOUS
  Filled 2014-11-02: qty 1992

## 2014-11-02 MED ORDER — POTASSIUM CHLORIDE 2 MEQ/ML IV SOLN
INTRAVENOUS | Status: DC
  Administered 2014-11-02 – 2014-11-07 (×4): via INTRAVENOUS
  Filled 2014-11-02 (×6): qty 1000

## 2014-11-02 NOTE — Progress Notes (Signed)
Patient ID: Vincent Black, male   DOB: [birth year], 44 y.o.   MRN: [id number]  [facility] PROGRESS NOTE  Vincent Black:[id number] DOB: 1970/12/28 DOA: 10/26/2014 PCP: No PCP Per Patient   Brief narrative:    44 y.o. male with Crohn's disease, C. difficile colitis presented with abd pain, fever, chills, poor oral intake several days in duration.   Assessment/Plan:    Principal Problem:   Sepsis secondary to severe diverticulitis and intra abd abscess - criteria for sepsis met on admission with T 102.2, HR 110, BP 110/73, WBC 14.6 - placed on ABX: Cipro and Flagyl IV but due to extent of the illness, surgery recommended changing ABX to Zosyn 3/19 - cultures positive for Citrobacter and Klebsiella, ABX changed to Rocephin and Flagyl 3/22 based on culture reports  - WBC trending: 14.6 --> 11.9 --> 11.8 --> 12.8 --> 10.7 --> 10.7 --> 9.4 --> 10 - pt reports feeling better this AM   Intra-abdominal abscess - appreciate IR following and assisting - IR placed a perc drain 3/18 that yielded 50cc of feculent material, cultures positive for Citrobacter and Klebsiella  - ABX as noted above  - tolerating TPN well for now - also tolerating clear liquid diet    Severe diverticulitis - ABX as noted above Active Problems:   Recent C. Diff - C. Diff from 3/20 positive - continue oral vancomycin    Crohn's disease - appreciate GI team following    Normocytic anemia, secondary to Crohn's disease - appears that Hg at baseline typically ~8 - likely dilutional component from IVF pt has been receiving  - no signs of active bleeding, if Hg < 7 will plan on transfusing  - repeat CBC in AM   Hyponatremia - from pre renal etiology and dehydration in the setting of sepsis - Na stabilizing    Oral thrush - continue Fluconazole  - added magic mouthwash and pt reports he has less odynophagia, almost resolve    Moderate PCM - in the context of acute illness - TPN to be started today   Hypokalemia - keep K > 4 - continue to supplement, Mg is WNL    Thrombocytosis - Plt trending down, will repeat CBC in AM  DVT prophylaxis - Heparin SQ  Code Status: Full.  Family Communication:  plan of care discussed with the patient and wife at bedside, father at bedside  Disposition Plan: Home when stable.   IV access:  Peripheral IV  Procedures and diagnostic studies:    Ct Abdomen Pelvis W Contrast  10/26/2014    Severe colitis or diverticulitis involving the proximal sigmoid colon, with large pericolonic abscess containing stool measuring approximately 6 x 8 cm. Background  Mild diffuse colitis also noted, without other areas of colonic perforation or abscess.  No evidence of free intraperitoneal air.    Medical Consultants:  GI - Dr. Benson Norway IR ID Surgery   Other Consultants:  None  IAnti-Infectives:   Ciprofloxacin IV 3/17 --> 3/19 Metronidazole IV 3/17 --> 3/19, restarted 3/22 --> Rocephin 3/22 --> Zosyn 3/19 --> 3/22 Vancomycin PO 3/17 --> Fluconazole IV 3/17 -->  Faye Ramsay, MD  [facility] Pager [phone]   If 7PM-7AM, please contact night-coverage [email or web address] Password [facility] 11/02/2014, 3:25 PM   LOS: 7 days   HPI/Subjective: No events overnight.   Objective: Filed Vitals:   11/02/14 0600 11/02/14 0639 11/02/14 1132 11/02/14 1302  BP: 127/81   125/87  Pulse: 101   103  Temp: 98.9 F (  37.2 C)   98.2 F (36.8 C)  TempSrc: Oral   Oral  Resp: _0 Height:      Weight:      SpO2: 95% 94% 98% 97%    Intake/Output Summary (Last 24 hours) at 11/02/14 1525 Last data filed at 11/02/14 1407  Gross per 24 hour  Intake 2391.5 ml  Output   1185 ml  Net 1206.5 ml    Exam:   General:  Pt is alert, follows commands appropriately, not in acute distress  Cardiovascular: Regular rhythm, tachycardic, S1/S2, no murmurs, no rubs, no gallops  Respiratory: Clear to auscultation bilaterally, no wheezing, no crackles, no rhonchi  Abdomen: Soft,  less tender in epigastric area, non distended, drain in place with moderate amount of brownish drainage   Extremities: No edema, pulses DP and PT palpable bilaterally  Neuro: Grossly nonfocal  Data Reviewed: Basic Metabolic Panel:  Recent Labs Lab 10/29/14 0500 10/29/14 0605 10/30/14 0615 10/31/14 0640 11/01/14 0534 11/02/14 0549  NA  --  129* 131* 131* 134* 134*  K  --  3.2* 3.8 4.0 3.1* 3.9  CL  --  94* 95* 94* 99 103  CO2  --  _1 32 26  GLUCOSE  --  85 91 90 121* 133*  BUN  --  <5* <5* <5* <5* <5*  CREATININE  --  0.82 0.81 0.80 0.62 0.54  CALCIUM  --  7.6* 7.6* 7.3* 7.3* 7.3*  MG 1.8  --   --  1.9 1.8 1.9  PHOS  --   --   --  2.7 2.7 2.6   Liver Function Tests:  Recent Labs Lab 10/26/14 1825 10/27/14 0630 10/28/14 0243 11/01/14 0534 11/02/14 0549  AST _2 ALT _3 ALKPHOS 81 68 65 56 57  BILITOT 0.8 0.3 0.5 0.4 0.4  PROT 6.1 5.3* 4.7* 4.7* 4.7*  ALBUMIN 2.0* 1.7* 1.5* 1.5* 1.6*   CBC:  Recent Labs Lab 10/29/14 0605 10/30/14 0615 10/31/14 0640 11/01/14 0534 11/02/14 0549  WBC 12.5* 10.7* 10.7* 9.4 10.0  NEUTROABS  --   --   --  6.1  --   HGB 8.5* 8.0* 7.7* 7.2* 7.4*  HCT 26.4* 25.0* 24.3* 22.5* 23.8*  MCV 93.0 92.6 92.7 93.4 94.1  PLT 743* 752* 805* 674* 648*   Scheduled Meds: . fluconazole  IV  100 mg Intravenous Q24H  . heparin  5,000 Units Subcutaneous 3 times per day  . magic mouthwash  10 mL Oral QID  . morphine  30 mg Oral Q12H  . ZOSYN  IV  3.375 g Intravenous Q8H  . vancomycin  125 mg Oral 4 times per day    Continuous Infusions: . Marland KitchenTPN (CLINIMIX-E) Adult     And  . fat emulsion    . Marland KitchenTPN (CLINIMIX-E) Adult 40 mL/hr at 11/01/14 1730   And  . fat emulsion 168 mL (11/01/14 1730)  . 0.9 % sodium chloride with kcl 70 mL/hr at 11/02/14 1337  . 0.9 % sodium chloride with kcl

## 2014-11-02 NOTE — Progress Notes (Signed)
PARENTERAL NUTRITION CONSULT NOTE - FOLLOW UP  Pharmacy Consult:  TPN Indication:  Sigmoid abscess with high likelihood of fistula formation  No Known Allergies  Patient Measurements: Height: 5\' 7"  (170.2 cm) Weight: 162 lb 11.2 oz (73.8 kg) IBW/kg (Calculated) : 66.1  Usual weight = 96 kg Current weight = 76 kg  Vital Signs: Temp: 98.9 F (37.2 C) (03/24 0600) Temp Source: Oral (03/24 0600) BP: 127/81 mmHg (03/24 0600) Pulse Rate: 101 (03/24 0600) Intake/Output from previous day: 03/23 0701 - 03/24 0700 In: 1628.7 [I.V.:1173.7; IV Piggyback:450] Out: 1856 [Urine:1400; Drains:10]  Labs:  Recent Labs  10/31/14 0640 11/01/14 0534 11/02/14 0549  WBC 10.7* 9.4 10.0  HGB 7.7* 7.2* 7.4*  HCT 24.3* 22.5* 23.8*  PLT 805* 674* 648*     Recent Labs  10/31/14 0640 11/01/14 0534 11/02/14 0549  NA 131* 134* 134*  K 4.0 3.1* 3.9  CL 94* 99 103  CO2 30 32 26  GLUCOSE 90 121* 133*  BUN <5* <5* <5*  CREATININE 0.80 0.62 0.54  CALCIUM 7.3* 7.3* 7.3*  MG 1.9 1.8 1.9  PHOS 2.7 2.7 2.6  PROT  --  4.7* 4.7*  ALBUMIN  --  1.5* 1.6*  AST  --  19 17  ALT  --  9 9  ALKPHOS  --  56 57  BILITOT  --  0.4 0.4  TRIG  --  115  --    Estimated Creatinine Clearance: 112.5 mL/min (by C-G formula based on Cr of 0.54).    Recent Labs  11/01/14 1812 11/02/14 0126 11/02/14 0603  GLUCAP 119* 117* 127*     Insulin Requirements in the past 24 hours:  3 units sensitive SSI  Assessment: 89 YOM with history of Crohn's and recent C.diff colitis presented on 10/26/14 with worsening abdominal pain.  CT showed sigmoid abscess and is s/p drain placement on 10/27/14 by IR.  Medical team suspects fistulous connection from colon to abscess cavity.  Given likelihood of surgery, high suspicion of fistula and poor nutritional status (baseline prealbumin at 4.3 and significant weight loss), Pharmacy consulted to manage TPN.  GI: Crohn's on Humira, Imuran, prednisone PTA.  Intra-abd abscess drain  yielded feculent material (resolved) >> improving and may not need surgery, started clears.   Baseline prealbumin 4.3.  Repeat CT in a couple of days Endo: no hx DM - CBGs controlled Lytes: mild hyponatremia, others WNL - watch Renal: SCr stable, CrCL 113 ml/min - good UOP 0.8 ml/kg/hr, NS30K at 60 ml/hr Pulm: remains on RA Cards: no hx - BP controlled, mild tachy Hepatobil: LFTs / tbili / TG WNL Neuro: hx anxiety and ankylosing spondylitis - weaning Dilaudid PCA + MS Contin, receiving PRN Ativan, pain score 4-6 ID: Diflucan and Zosyn changed to CTX/Flagyl for intra-abd abscess + PO Vanc for C.diff - afebrile, WBC WNL Best Practices: heparin SQ, MC TPN Access: PICC 10/31/14 TPN day#: 2 (3/22 >> )  Current Nutrition:  TPN + clear liquid diet Anticipated goal: Clinimix E 5/15 at 100 ml/hr + IVFE at 7 ml/hr = 2040 kCal and 120gm of protein per day  Nutritional Goals:  1900-2100 kCal, 100-120 grams of protein per day   Plan:  - Increase Clinimix E 5/15 to 83 ml/hr + continue IVFE at 7 ml/hr to provide 1750 kCal and 100gm protein per day.  Will increase to goal rate if diet is not advanced. - Daily multivitamin and trace elements - Continue sensitive SSI Q6H.  D/C if CBGs remain  controlled at goal TPN rate. - Decrease IVF to 20 ml/hr once new TPN bag starts - F/U with diet advancement if no surgery    Prabhleen Montemayor D. Mina Marble, PharmD, BCPS Pager:  [phone] 11/02/2014, 9:42 AM

## 2014-11-02 NOTE — Progress Notes (Signed)
Subjective:  Pt con't to do well.  Tol CLD with no abd pain.  Normal BM  Objective: Vital signs in last 24 hours: Temp:  [98.4 F (36.9 C)-98.9 F (37.2 C)] 98.9 F (37.2 C) (03/24 0600) Pulse Rate:  [101-106] 101 (03/24 0600) Resp:  [16-20] 18 (03/24 0639) BP: (127-129)/(80-86) 127/81 mmHg (03/24 0600) SpO2:  [93 %-99 %] 94 % (03/24 0639) Last BM Date: 11/01/14  Intake/Output from previous day: 03/23 0701 - 03/24 0700 In: 1628.7 [I.V.:1173.7; IV Piggyback:450] Out: 7341 [Urine:1400; Drains:10] Intake/Output this shift: Total I/O In: 5 [Other:5] Out: -   General appearance: alert and cooperative GI: soft, ttp LLW near drain, drain yellow/thick  Lab Results:   Recent Labs  11/01/14 0534 11/02/14 0549  WBC 9.4 10.0  HGB 7.2* 7.4*  HCT 22.5* 23.8*  PLT 674* 648*   BMET  Recent Labs  11/01/14 0534 11/02/14 0549  NA 134* 134*  K 3.1* 3.9  CL 99 103  CO2 32 26  GLUCOSE 121* 133*  BUN <5* <5*  CREATININE 0.62 0.54  CALCIUM 7.3* 7.3*   PT/INR No results for input(s): LABPROT, INR in the last 72 hours. ABG No results for input(s): PHART, HCO3 in the last 72 hours.  Invalid input(s): PCO2, PO2  Studies/Results: No results found.  Anti-infectives: Anti-infectives    Start     Dose/Rate Route Frequency Ordered Stop   10/31/14 2000  cefTRIAXone (ROCEPHIN) 1 g in dextrose 5 % 50 mL IVPB - Premix     1 g 100 mL/hr over 30 Minutes Intravenous Every 24 hours 10/31/14 1855     10/31/14 2000  metroNIDAZOLE (FLAGYL) IVPB 500 mg     500 mg 100 mL/hr over 60 Minutes Intravenous Every 8 hours 10/31/14 1855     10/28/14 1200  piperacillin-tazobactam (ZOSYN) IVPB 3.375 g  Status:  Discontinued     3.375 g 12.5 mL/hr over 240 Minutes Intravenous Every 8 hours 10/28/14 1021 10/31/14 1855   10/26/14 2200  metroNIDAZOLE (FLAGYL) IVPB 500 mg  Status:  Discontinued     500 mg 100 mL/hr over 60 Minutes Intravenous Every 8 hours 10/26/14 2015 10/28/14 0943   10/26/14  2100  ciprofloxacin (CIPRO) IVPB 400 mg  Status:  Discontinued     400 mg 200 mL/hr over 60 Minutes Intravenous Every 12 hours 10/26/14 2027 10/28/14 1021   10/26/14 1900  fluconazole (DIFLUCAN) IVPB 100 mg     100 mg 50 mL/hr over 60 Minutes Intravenous Every 24 hours 10/26/14 1805     10/26/14 1800  vancomycin (VANCOCIN) 50 mg/mL oral solution 125 mg     125 mg Oral 4 times per day 10/26/14 1655        Assessment/Plan:  Intra-abdominal abscess with crohn's disease -Patient switched back to Rocephin and Flagyl yesterday by ID, along with oral vancomycin -IR placed a perc drain 10/27/14. The patient may have a fistulous connection from his colon to this abscess cavity, although he currently has minimal output, and is now changed in caliber. CXs show Citrobacter freundii & Klebsiella oxytoca sensitive to Rocephin and Flagyl - Gram positive cocci in pairs/chains/clusters, few gram negatives -TPN/PICC for protein calorie malnutrition -can try clear liquids today, likely OK to adv diet as tol if tol PO with no abd pain -WBC stable at 10  today. Continue to monitor with conservative management.  -would try to avoid surgery in this patient right now as much as possible unless he deteriorated Hypoalbuminemia/low prealbumin - Starting TPN -  Albumin is 1.5. Patient states he was eating great prior to admission; however, this is very low for a 44 yo male. Prealbumin is 4.3. Insert Picc, start TPN given malnourished state currently, potentially surgery if not improving. Recurrent C.diff - stool still positive 10/29/14 -Oral Vanc, ID following Pyrosis -Start PPI   LOS: 7 days    Rosario Jacks., [facility] 11/02/2014

## 2014-11-02 NOTE — Progress Notes (Signed)
Subjective: Feeling much better.  No use of any pain medications.  Objective: Vital signs in last 24 hours: Temp:  [98.2 F (36.8 C)-98.9 F (37.2 C)] 98.2 F (36.8 C) (03/24 1302) Pulse Rate:  [101-106] 103 (03/24 1302) Resp:  [16-20] 17 (03/24 1600) BP: (125-129)/(81-87) 125/87 mmHg (03/24 1302) SpO2:  [94 %-99 %] 99 % (03/24 1600) Last BM Date: 11/01/14  Intake/Output from previous day: 03/23 0701 - 03/24 0700 In: 1628.7 [I.V.:1173.7; IV Piggyback:450] Out: 6301 [Urine:1400; Drains:10] Intake/Output this shift: Total I/O In: 762.8 [P.O.:500; I.V.:257.8; Other:5] Out: 300 [Urine:300]  General appearance: alert, fatigued and no distress GI: tender at the drainage site  Lab Results:  Recent Labs  10/31/14 0640 11/01/14 0534 11/02/14 0549  WBC 10.7* 9.4 10.0  HGB 7.7* 7.2* 7.4*  HCT 24.3* 22.5* 23.8*  PLT 805* 674* 648*   BMET  Recent Labs  10/31/14 0640 11/01/14 0534 11/02/14 0549  NA 131* 134* 134*  K 4.0 3.1* 3.9  CL 94* 99 103  CO2 30 32 26  GLUCOSE 90 121* 133*  BUN <5* <5* <5*  CREATININE 0.80 0.62 0.54  CALCIUM 7.3* 7.3* 7.3*   LFT  Recent Labs  11/02/14 0549  PROT 4.7*  ALBUMIN 1.6*  AST 17  ALT 9  ALKPHOS 57  BILITOT 0.4   PT/INR No results for input(s): LABPROT, INR in the last 72 hours. Hepatitis Panel No results for input(s): HEPBSAG, HCVAB, HEPAIGM, HEPBIGM in the last 72 hours. C-Diff No results for input(s): CDIFFTOX in the last 72 hours. Fecal Lactopherrin No results for input(s): FECLLACTOFRN in the last 72 hours.  Studies/Results: No results found.  Medications:  Scheduled: . antiseptic oral rinse  7 mL Mouth Rinse q12n4p  . cefTRIAXone (ROCEPHIN)  IV  1 g Intravenous Q24H  . chlorhexidine  15 mL Mouth Rinse BID  . feeding supplement (RESOURCE BREEZE)  1 Container Oral TID BM  . fluconazole (DIFLUCAN) IV  100 mg Intravenous Q24H  . heparin  5,000 Units Subcutaneous 3 times per day  . HYDROmorphone PCA 0.3 mg/mL    Intravenous 6 times per day  . insulin aspart  0-9 Units Subcutaneous 4 times per day  . magic mouthwash  10 mL Oral QID  . metronidazole  500 mg Intravenous Q8H  . morphine  30 mg Oral Q12H  . pantoprazole (PROTONIX) IV  40 mg Intravenous Q24H  . vancomycin  125 mg Oral 4 times per day   Continuous: . Marland KitchenTPN (CLINIMIX-E) Adult 83 mL/hr at 11/02/14 1703   And  . fat emulsion 168 mL (11/02/14 1703)  . Marland KitchenTPN (CLINIMIX-E) Adult Stopped (11/02/14 1704)   And  . fat emulsion Stopped (11/02/14 1704)  . 0.9 % sodium chloride with kcl 70 mL/hr at 11/02/14 1337  . 0.9 % sodium chloride with kcl      Assessment/Plan: 1) Sigmoid colon abscess. 2) Crohn's disease.   He is much better at this time with the additional drainage of the abscess.  In fact, all of his other symptoms of diffuse abdominal pain and dysuria have resolved.  This is very encouraging.  Plan: 1) Continue with drainage.  Repeat CT scan per IR. 2) Continue with antibiotics.   LOS: 7 days   Cornelious Bartolucci D 11/02/2014, 5:12 PM

## 2014-11-02 NOTE — Progress Notes (Signed)
Patient ID: Vincent Black, male   DOB: July 22, 1971, 44 y.o.   MRN: [id number]         [facility]    Date of Admission:  10/26/2014           Day 8 IV antibiotics         Day 8 oral vancomycin  Principal Problem:   Pericolonic abscess Active Problems:   History of Clostridium difficile colitis   Odynophagia   Crohn's disease   Protein-calorie malnutrition, severe   Ankylosing spondylitis   Unintentional weight loss   Normocytic anemia   Left lower quadrant pain   . antiseptic oral rinse  7 mL Mouth Rinse q12n4p  . cefTRIAXone (ROCEPHIN)  IV  1 g Intravenous Q24H  . chlorhexidine  15 mL Mouth Rinse BID  . fluconazole (DIFLUCAN) IV  100 mg Intravenous Q24H  . heparin  5,000 Units Subcutaneous 3 times per day  . HYDROmorphone PCA 0.3 mg/mL   Intravenous 6 times per day  . insulin aspart  0-9 Units Subcutaneous 4 times per day  . magic mouthwash  10 mL Oral QID  . metronidazole  500 mg Intravenous Q8H  . morphine  30 mg Oral Q12H  . pantoprazole (PROTONIX) IV  40 mg Intravenous Q24H  . vancomycin  125 mg Oral 4 times per day    Subjective: He has not been sleeping well but otherwise is feeling much better. His abdominal pain is improving. He is having normal bowel movements.  Review of Systems: Pertinent items are noted in HPI.  Past Medical History  Diagnosis Date  . Crohn disease   . C. difficile colitis 09/30/2014  . Ankylosing spondylitis 09/30/2014  . Anxiety     History  Substance Use Topics  . Smoking status: Former Smoker -- 0.50 packs/day for 10 years    Types: Cigarettes  . Smokeless tobacco: Never Used     Comment: 'quit smoking in ~ 2014"  . Alcohol Use: Yes     Comment: 10/25/2014 "might have a drink a couple times/yr"    History reviewed. No pertinent family history. No Known Allergies  OBJECTIVE: Blood pressure 125/87, pulse 103, temperature 98.2 F (36.8 C), temperature source Oral, resp. rate 18, height 5\' 7"   (1.702 m), weight 162 lb 11.2 oz (73.8 kg), SpO2 97 %. General: he is alert and comfortable Abdomen: there is a moderate amount of brown fluid in his drain  Lab Results Lab Results  Component Value Date   WBC 10.0 11/02/2014   HGB 7.4* 11/02/2014   HCT 23.8* 11/02/2014   MCV 94.1 11/02/2014   PLT 648* 11/02/2014    Lab Results  Component Value Date   CREATININE 0.54 11/02/2014   BUN <5* 11/02/2014   NA 134* 11/02/2014   K 3.9 11/02/2014   CL 103 11/02/2014   CO2 26 11/02/2014    Lab Results  Component Value Date   ALT 9 11/02/2014   AST 17 11/02/2014   ALKPHOS 57 11/02/2014   BILITOT 0.4 11/02/2014     Microbiology: Recent Results (from the past 240 hour(s))  Culture, blood (routine x 2)     Status: None   Collection Time: 10/26/14 10:13 PM  Result Value Ref Range Status   Specimen Description BLOOD LEFT ARM  Final   Special Requests BOTTLES DRAWN AEROBIC AND ANAEROBIC 5CC  Final   Culture   Final    NO GROWTH 5 DAYS Performed at [facility]  Report Status 11/02/2014 FINAL  Final  Culture, blood (routine x 2)     Status: None   Collection Time: 10/26/14 10:15 PM  Result Value Ref Range Status   Specimen Description BLOOD LEFT HAND  Final   Special Requests   Final    BOTTLES DRAWN AEROBIC AND ANAEROBIC [place] BLUE 3CC PURPLE   Culture   Final    NO GROWTH 5 DAYS Performed at [facility]    Report Status 11/02/2014 FINAL  Final  Anaerobic culture     Status: None   Collection Time: 10/27/14  5:56 PM  Result Value Ref Range Status   Specimen Description ABSCESS ABDOMEN  Final   Special Requests NONE  Final   Gram Stain   Final    MODERATE WBC PRESENT,BOTH PMN AND MONONUCLEAR NO SQUAMOUS EPITHELIAL CELLS SEEN MODERATE GRAM POSITIVE COCCI IN PAIRS IN CHAINS IN CLUSTERS FEW GRAM NEGATIVE RODS Performed at [facility]    Culture   Final    NO ANAEROBES ISOLATED Performed at [facility]    Report Status 11/01/2014 FINAL   Final  Culture, routine-abscess     Status: None   Collection Time: 10/27/14  5:56 PM  Result Value Ref Range Status   Specimen Description ABSCESS ABDOMEN  Final   Special Requests NONE  Final   Gram Stain   Final    MODERATE WBC PRESENT,BOTH PMN AND MONONUCLEAR NO SQUAMOUS EPITHELIAL CELLS SEEN MODERATE GRAM POSITIVE COCCI IN PAIRS IN CHAINS IN CLUSTERS FEW GRAM NEGATIVE RODS Performed at [facility]    Culture   Final    ABUNDANT CITROBACTER FREUNDII ABUNDANT KLEBSIELLA OXYTOCA Performed at [facility]    Report Status 10/31/2014 FINAL  Final   Organism ID, Bacteria CITROBACTER FREUNDII  Final   Organism ID, Bacteria KLEBSIELLA OXYTOCA  Final      Susceptibility   Citrobacter freundii - MIC*    CEFAZOLIN >=64 RESISTANT Resistant     CEFEPIME <=1 SENSITIVE Sensitive     CEFTAZIDIME <=1 SENSITIVE Sensitive     CEFTRIAXONE <=1 SENSITIVE Sensitive     CIPROFLOXACIN <=0.25 SENSITIVE Sensitive     GENTAMICIN <=1 SENSITIVE Sensitive     IMIPENEM <=0.25 SENSITIVE Sensitive     PIP/TAZO <=4 SENSITIVE Sensitive     TOBRAMYCIN <=1 SENSITIVE Sensitive     TRIMETH/SULFA <=20 SENSITIVE Sensitive     * ABUNDANT CITROBACTER FREUNDII   Klebsiella oxytoca - MIC*    AMPICILLIN RESISTANT      AMPICILLIN/SULBACTAM 4 SENSITIVE Sensitive     CEFAZOLIN <=4 SENSITIVE Sensitive     CEFEPIME <=1 SENSITIVE Sensitive     CEFTAZIDIME <=1 SENSITIVE Sensitive     CEFTRIAXONE <=1 SENSITIVE Sensitive     CIPROFLOXACIN <=0.25 SENSITIVE Sensitive     GENTAMICIN <=1 SENSITIVE Sensitive     IMIPENEM <=0.25 SENSITIVE Sensitive     PIP/TAZO <=4 SENSITIVE Sensitive     TOBRAMYCIN <=1 SENSITIVE Sensitive     TRIMETH/SULFA <=20 SENSITIVE Sensitive     * ABUNDANT KLEBSIELLA OXYTOCA  Clostridium Difficile by PCR     Status: Abnormal   Collection Time: 10/29/14  9:58 AM  Result Value Ref Range Status   C difficile by pcr POSITIVE (A) NEGATIVE Final    Comment: CRITICAL RESULT CALLED TO,  READ BACK BY AND VERIFIED WITH: A.FORD,RN 10/30/14 @1420  BY V.WILKINS     Assessment: He is improving slowly.  Plan: 1. Continue current antibiotics  Michel Bickers, MD [facility] [phone] pager   [phone] cell 11/02/2014, 2:54 PM

## 2014-11-02 NOTE — Progress Notes (Addendum)
NUTRITION FOLLOW-UP  INTERVENTION: TPN per pharmacy  Provide Resource Breeze TID, each provides 250 kcal, 9 grams of protein, 197 ml of fluid  Monitor supplement tolerance.  NUTRITION DIAGNOSIS: Inadequate oral intake related to decreased appetite as evidenced by abdominal pain and pt report; ongoing  Goal: Pt to meet >/= 90% of estimated needs; not met  Monitor:  TPN adequacy/tolerance, supplement tolerance, GI status, weight trends, labs  ASSESSMENT: Pt with PMH of Chron's disease, c. diff colitis. Presents with abdominal pain on left side ongoing for the past few months and worsening over the last few days.  Pt having bloody diarrhea, has resolved.   3/18- Per wt records pt has lost 9% of body weight in the past month (significant for time frame).  Pt reports usual body weight of 212 lbs and last weighed this in November of 2015.   3/22- Sigmoid abscess with high likelihood of fistula formation. Consulted for TPN energy requirements.   3/24- Diet advanced to clear liquids. Pt denied abdominal pain, but confirmed some nausea. He is having solid BMs. Will provide Resource Breeze TID. Encouraged pt to try PO intake as tolerated. Continue to monitor supplement tolerance, GI status, TPN.    Current nutrition: TPN Clinimix 5/15 at 83 ml/hr + IVFE at 7 ml/hr  Provides 1750 kcal (92% of estimated needs), 100 grams of protein  Labs and medications reviewed: Low Na, BUN, Ca  Plan per Pharmacy: - Increase Clinimix E 5/15 to 83 ml/hr + continue IVFE at 7 ml/hr to provide 1750 kCal and 100gm protein per day. Will increase to goal rate if diet is not advanced. - Daily multivitamin and trace elements - Continue sensitive SSI Q6H. D/C if CBGs remain controlled at goal TPN rate. - Decrease IVF to 20 ml/hr once new TPN bag starts - F/U with diet advancement if no surgery   Height: Ht Readings from Last 1 Encounters:  10/26/14 _0  (1.702 m)    Weight: Wt Readings from Last 1  Encounters:  11/01/14 162 lb 11.2 oz (73.8 kg)    BMI:  Body mass index is 25.48 kg/(m^2). Overweight  Estimated Nutritional Needs: Kcal: 1900-2100 kcal Protein: 100-120 g protein Fluid: >/= 1.9 L  Skin: WDL  Diet Order: TPN (CLINIMIX-E) Adult Diet clear liquid .TPN (CLINIMIX-E) Adult  EDUCATION NEEDS: -No education needs identified at this time   Intake/Output Summary (Last 24 hours) at 11/02/14 1455 Last data filed at 11/02/14 1407  Gross per 24 hour  Intake 2391.5 ml  Output   1185 ml  Net 1206.5 ml    Last BM: 3/24   Labs:   Recent Labs Lab 10/31/14 0640 11/01/14 0534 11/02/14 0549  NA 131* 134* 134*  K 4.0 3.1* 3.9  CL 94* 99 103  CO2 30 32 26  BUN <5* <5* <5*  CREATININE 0.80 0.62 0.54  CALCIUM 7.3* 7.3* 7.3*  MG 1.9 1.8 1.9  PHOS 2.7 2.7 2.6  GLUCOSE 90 121* 133*    CBG (last 3)   Recent Labs  11/02/14 0126 11/02/14 0603 11/02/14 1133  GLUCAP 117* 127* 132*    Scheduled Meds: . antiseptic oral rinse  7 mL Mouth Rinse q12n4p  . cefTRIAXone (ROCEPHIN)  IV  1 g Intravenous Q24H  . chlorhexidine  15 mL Mouth Rinse BID  . fluconazole (DIFLUCAN) IV  100 mg Intravenous Q24H  . heparin  5,000 Units Subcutaneous 3 times per day  . HYDROmorphone PCA 0.3 mg/mL   Intravenous 6 times per day  .  insulin aspart  0-9 Units Subcutaneous 4 times per day  . magic mouthwash  10 mL Oral QID  . metronidazole  500 mg Intravenous Q8H  . morphine  30 mg Oral Q12H  . pantoprazole (PROTONIX) IV  40 mg Intravenous Q24H  . vancomycin  125 mg Oral 4 times per day    Continuous Infusions: . Marland KitchenTPN (CLINIMIX-E) Adult     And  . fat emulsion    . Marland KitchenTPN (CLINIMIX-E) Adult 40 mL/hr at 11/01/14 1730   And  . fat emulsion 168 mL (11/01/14 1730)  . 0.9 % sodium chloride with kcl 70 mL/hr at 11/02/14 1337  . 0.9 % sodium chloride with kcl       Wynona Dove, MS Dietetic Intern Pager: [phone]

## 2014-11-03 ENCOUNTER — Encounter (HOSPITAL_COMMUNITY): Payer: Self-pay

## 2014-11-03 ENCOUNTER — Inpatient Hospital Stay (HOSPITAL_COMMUNITY): Payer: 59

## 2014-11-03 LAB — CBC
HEMATOCRIT: 22.7 % — AB (ref 39.0–52.0)
Hemoglobin: 7 g/dL — ABNORMAL LOW (ref 13.0–17.0)
MCH: 29.2 pg (ref 26.0–34.0)
MCHC: 30.8 g/dL (ref 30.0–36.0)
MCV: 94.6 fL (ref 78.0–100.0)
Platelets: 543 10*3/uL — ABNORMAL HIGH (ref 150–400)
RBC: 2.4 MIL/uL — AB (ref 4.22–5.81)
RDW: 17.1 % — AB (ref 11.5–15.5)
WBC: 8.9 10*3/uL (ref 4.0–10.5)

## 2014-11-03 LAB — BASIC METABOLIC PANEL
ANION GAP: 5 (ref 5–15)
BUN: <5 mg/dL — ABNORMAL LOW (ref 6–23)
CHLORIDE: 104 mmol/L (ref 96–112)
CO2: 25 mmol/L (ref 19–32)
CREATININE: 0.51 mg/dL (ref 0.50–1.35)
Calcium: 7.1 mg/dL — ABNORMAL LOW (ref 8.4–10.5)
GFR calc Af Amer: >90 mL/min (ref >90)
GFR calc non Af Amer: >90 mL/min (ref >90)
Glucose, Bld: 119 mg/dL — ABNORMAL HIGH (ref 70–99)
Potassium: 3.7 mmol/L (ref 3.5–5.1)
SODIUM: 134 mmol/L — AB (ref 135–145)

## 2014-11-03 LAB — GLUCOSE, CAPILLARY
Glucose-Capillary: 113 mg/dL — ABNORMAL HIGH (ref 70–99)
Glucose-Capillary: 116 mg/dL — ABNORMAL HIGH (ref 70–99)
Glucose-Capillary: 135 mg/dL — ABNORMAL HIGH (ref 70–99)
Glucose-Capillary: 140 mg/dL — ABNORMAL HIGH (ref 70–99)

## 2014-11-03 LAB — PREPARE RBC (CROSSMATCH)

## 2014-11-03 LAB — ABO/RH: ABO/RH(D): O POS

## 2014-11-03 MED ORDER — TRACE MINERALS CR-CU-F-FE-I-MN-MO-SE-ZN IV SOLN
INTRAVENOUS | Status: AC
  Administered 2014-11-03: 18:00:00 via INTRAVENOUS
  Filled 2014-11-03: qty 1992

## 2014-11-03 MED ORDER — TRACE MINERALS CR-CU-F-FE-I-MN-MO-SE-ZN IV SOLN
INTRAVENOUS | Status: AC
  Filled 2014-11-03: qty 1992

## 2014-11-03 MED ORDER — CLINIMIX E/DEXTROSE (5/15) 5 % IV SOLN
INTRAVENOUS | Status: DC
  Filled 2014-11-03: qty 1992

## 2014-11-03 MED ORDER — IOHEXOL 300 MG/ML  SOLN
100.0000 mL | Freq: Once | INTRAMUSCULAR | Status: AC | PRN
  Administered 2014-11-03: 100 mL via INTRAVENOUS

## 2014-11-03 MED ORDER — FAT EMULSION 20 % IV EMUL: 168.0000 mL | INTRAVENOUS | Status: DC

## 2014-11-03 MED ORDER — IOHEXOL 300 MG/ML  SOLN
25.0000 mL | INTRAMUSCULAR | Status: AC
  Administered 2014-11-03 (×2): 25 mL via ORAL

## 2014-11-03 MED ORDER — SODIUM CHLORIDE 0.9 % IV SOLN: Freq: Once | INTRAVENOUS | Status: DC

## 2014-11-03 MED ORDER — FAT EMULSION 20 % IV EMUL
168.0000 mL | INTRAVENOUS | Status: DC
  Filled 2014-11-03: qty 200

## 2014-11-03 MED ORDER — FAT EMULSION 20 % IV EMUL
168.0000 mL | INTRAVENOUS | Status: AC
  Filled 2014-11-03: qty 200

## 2014-11-03 MED ORDER — TRACE MINERALS CR-CU-F-FE-I-MN-MO-SE-ZN IV SOLN: INTRAVENOUS | Status: DC

## 2014-11-03 MED ORDER — FAT EMULSION 20 % IV EMUL
168.0000 mL | INTRAVENOUS | Status: AC
  Administered 2014-11-03: 168 mL via INTRAVENOUS
  Filled 2014-11-03: qty 200

## 2014-11-03 NOTE — Progress Notes (Signed)
[facility] Progress Note     Subjective: Pt doing much better. Pain improved.  No N/V, tolerating clears well.  Ambulating some.  Having BM's and flatus.  Father and wife at bedside.    Objective: Vital signs in last 24 hours: Temp:  [98.2 F (36.8 C)-99.3 F (37.4 C)] 98.5 F (36.9 C) (03/25 0537) Pulse Rate:  [100-106] 100 (03/25 0537) Resp:  [15-19] 15 (03/25 0800) BP: (110-127)/(79-87) 110/79 mmHg (03/25 0537) SpO2:  [93 %-99 %] 95 % (03/25 0800) Last BM Date: 11/02/14  Intake/Output from previous day: 03/24 0701 - 03/25 0700 In: 2638.5 [P.O.:1770; I.V.:508.5; IV Piggyback:350] Out: 2730 [Urine:2725; Drains:5] Intake/Output this shift:    PE: Gen:  Alert, NAD, pleasant Abd: Soft, less distension, minimal tenderness, +BS, no HSM, IR perc drain in place in LLQ with light brown/tan cloudy drainage  Lab Results:   Recent Labs  11/02/14 0549 11/03/14 0516  WBC 10.0 8.9  HGB 7.4* 7.0*  HCT 23.8* 22.7*  PLT 648* 543*   BMET  Recent Labs  11/02/14 0549 11/03/14 0516  NA 134* 134*  K 3.9 3.7  CL 103 104  CO2 26 25  GLUCOSE 133* 119*  BUN <5* <5*  CREATININE 0.54 0.51  CALCIUM 7.3* 7.1*   PT/INR No results for input(s): LABPROT, INR in the last 72 hours. CMP     Component Value Date/Time   NA 134* 11/03/2014 0516   K 3.7 11/03/2014 0516   CL 104 11/03/2014 0516   CO2 25 11/03/2014 0516   GLUCOSE 119* 11/03/2014 0516   BUN <5* 11/03/2014 0516   CREATININE 0.51 11/03/2014 0516   CALCIUM 7.1* 11/03/2014 0516   PROT 4.7* 11/02/2014 0549   ALBUMIN 1.6* 11/02/2014 0549   AST 17 11/02/2014 0549   ALT 9 11/02/2014 0549   ALKPHOS 57 11/02/2014 0549   BILITOT 0.4 11/02/2014 0549   GFRNONAA >90 11/03/2014 0516   GFRAA >90 11/03/2014 0516   Lipase  No results found for: LIPASE     Studies/Results: No results found.  Anti-infectives: Anti-infectives    Start     Dose/Rate Route Frequency Ordered Stop   10/31/14 2000  cefTRIAXone  (ROCEPHIN) 1 g in dextrose 5 % 50 mL IVPB - Premix     1 g 100 mL/hr over 30 Minutes Intravenous Every 24 hours 10/31/14 1855     10/31/14 2000  metroNIDAZOLE (FLAGYL) IVPB 500 mg     500 mg 100 mL/hr over 60 Minutes Intravenous Every 8 hours 10/31/14 1855     10/28/14 1200  piperacillin-tazobactam (ZOSYN) IVPB 3.375 g  Status:  Discontinued     3.375 g 12.5 mL/hr over 240 Minutes Intravenous Every 8 hours 10/28/14 1021 10/31/14 1855   10/26/14 2200  metroNIDAZOLE (FLAGYL) IVPB 500 mg  Status:  Discontinued     500 mg 100 mL/hr over 60 Minutes Intravenous Every 8 hours 10/26/14 2015 10/28/14 0943   10/26/14 2100  ciprofloxacin (CIPRO) IVPB 400 mg  Status:  Discontinued     400 mg 200 mL/hr over 60 Minutes Intravenous Every 12 hours 10/26/14 2027 10/28/14 1021   10/26/14 1900  fluconazole (DIFLUCAN) IVPB 100 mg     100 mg 50 mL/hr over 60 Minutes Intravenous Every 24 hours 10/26/14 1805     10/26/14 1800  vancomycin (VANCOCIN) 50 mg/mL oral solution 125 mg     125 mg Oral 4 times per day 10/26/14 1655         Assessment/Plan Intra-abdominal abscess  with crohn's disease -Patient switched back to Rocephin and Flagyl yesterday by ID, along with oral vancomycin -IR placed a perc drain 10/27/14. The patient may have a fistulous connection from his colon to this abscess cavity CXs show Citrobacter freundii & Klebsiella oxytoca sensitive to Rocephin and Flagyl  -TPN/PICC for protein calorie malnutrition -Will discuss with Dr. Hulen Skains, may repeat CT today and if improved advance diet to fulls -WBC stable at 8.9 today. Continue to monitor with conservative management.  -Would try to avoid surgery in this patient right now as much as possible unless he deteriorated -Would d/c PCA and start orals and IV push prn Hypoalbuminemia/low prealbumin - Starting TPN -Albumin is 1.6. Patient states he was eating great prior to admission; however, this is very low for a 44 yo male. Prealbumin is 4.3.  Insert Picc, start TPN given malnourished state currently, potentially surgery if not improving. Recurrent C.diff - stool still positive 10/29/14 -Oral Vanc, ID following     LOS: 8 days    Black, Vincent Mierzwa 11/03/2014, 9:29 AM Pager: [phone]

## 2014-11-03 NOTE — Progress Notes (Signed)
Wasted 2 mg of pca pump dilaudid with [place].

## 2014-11-03 NOTE — Progress Notes (Signed)
Patient ID: Vincent Black, male   DOB: 15-Jun-1971, 44 y.o.   MRN: [id number]  [facility] PROGRESS NOTE  Vincent Black QGB:[id number] DOB: March 20, 1971 DOA: 10/26/2014 PCP: No PCP Per Patient   Brief narrative:    44 y.o. male with Crohn's disease, C. difficile colitis presented with abd pain, fever, chills, poor oral intake several days in duration.   Assessment/Plan:    Principal Problem:   Sepsis secondary to severe diverticulitis and intra abd abscess - criteria for sepsis met on admission with T 102.2, HR 110, BP 110/73, WBC 14.6 - placed on ABX: Cipro and Flagyl IV but due to extent of the illness, surgery recommended changing ABX to Zosyn 3/19 - cultures positive for Citrobacter and Klebsiella, ABX changed to Rocephin and Flagyl 3/22 based on culture reports  - WBC trending: 14.6 --> 11.9 --> 11.8 --> 12.8 --> 10.7 --> 10.7 --> 9.4 --> 10 --> 8.9 - pt reports feeling better this AM - plan for repeat CT abd and pelvis    Intra-abdominal abscess - appreciate IR following and assisting - IR placed a perc drain 3/18 that yielded 50cc of feculent material, cultures positive for Citrobacter and Klebsiella  - ABX as noted above  - tolerating TPN well for now - also tolerating clear liquid diet - depending on CT abd results, may be able to advance diet    Severe diverticulitis - ABX as noted above Active Problems:   Recent C. Diff - C. Diff from 3/20 positive - continue oral vancomycin    Crohn's disease - appreciate GI team following    Normocytic anemia, secondary to Crohn's disease - appears that Hg at baseline typically ~8 - no signs of active bleeding, since Hg is 7 this AM, will transfuse 1U PRB 3/25 - repeat CBC in AM   Hyponatremia - from pre renal etiology and dehydration in the setting of sepsis - Na stabilizing at 134   Oral thrush - completed treatment with Fluconazole, today is day 9, will stop Fluconazole today 3/25 - continue magic mouthwash - pt  reports he has less odynophagia, almost resolved   Moderate PCM - in the context of acute illness - continue TPN, possibly advance diet based on CT abd and pelvis    Hypokalemia - keep K > 4 - continue to supplement with IVF, Mg is WNL    Thrombocytosis - Plt trending down, will repeat CBC in AM  DVT prophylaxis - Heparin SQ  Code Status: Full.  Family Communication:  plan of care discussed with the patient and wife at bedside, father at bedside  Disposition Plan: Home when stable.   IV access:  Peripheral IV  Procedures and diagnostic studies:    Ct Abdomen Pelvis W Contrast  10/26/2014    Severe colitis or diverticulitis involving the proximal sigmoid colon, with large pericolonic abscess containing stool measuring approximately 6 x 8 cm. Background  Mild diffuse colitis also noted, without other areas of colonic perforation or abscess.  No evidence of free intraperitoneal air.    Medical Consultants:  GI - Dr. Benson Norway IR ID Surgery   Other Consultants:  None  IAnti-Infectives:   Ciprofloxacin IV 3/17 --> 3/19 Metronidazole IV 3/17 --> 3/19, restarted 3/22 --> Rocephin 3/22 --> Zosyn 3/19 --> 3/22 Vancomycin PO 3/17 --> Fluconazole IV 3/17 --> 3/25  Faye Ramsay, MD  [facility] Pager [phone]   If 7PM-7AM, please contact night-coverage [email or web address] Password [email or web address] 11/03/2014, 12:47 PM   LOS: 8 days  HPI/Subjective: No events overnight.   Objective: Filed Vitals:   11/03/14 0451 11/03/14 0537 11/03/14 0800 11/03/14 1230  BP:  110/79    Pulse:  100    Temp:  98.5 F (36.9 C)    TempSrc:  Oral    Resp: 16 18 15 15   Height:      Weight:      SpO2: 93% 97% 95% 95%    Intake/Output Summary (Last 24 hours) at 11/03/14 1247 Last data filed at 11/03/14 0945  Gross per 24 hour  Intake 2180.67 ml  Output   2430 ml  Net -249.33 ml    Exam:   General:  Pt is alert, follows commands appropriately, not in acute distress  Cardiovascular: Regular rhythm,  tachycardic, S1/S2, no murmurs, no rubs, no gallops  Respiratory: Clear to auscultation bilaterally, no wheezing, no crackles, no rhonchi  Abdomen: Soft, less tender in epigastric area, non distended, drain in place with small amount of brownish drainage   Extremities: No edema, pulses DP and PT palpable bilaterally  Neuro: Grossly nonfocal  Data Reviewed: Basic Metabolic Panel:  Recent Labs Lab 10/29/14 0500  10/30/14 0615 10/31/14 0640 11/01/14 0534 11/02/14 0549 11/03/14 0516  NA  --   < > 131* 131* 134* 134* 134*  K  --   < > 3.8 4.0 3.1* 3.9 3.7  CL  --   < > 95* 94* 99 103 104  CO2  --   < > 28 30 32 26 25  GLUCOSE  --   < > 91 90 121* 133* 119*  BUN  --   < > <5* <5* <5* <5* <5*  CREATININE  --   < > 0.81 0.80 0.62 0.54 0.51  CALCIUM  --   < > 7.6* 7.3* 7.3* 7.3* 7.1*  MG 1.8  --   --  1.9 1.8 1.9  --   PHOS  --   --   --  2.7 2.7 2.6  --   < > = values in this interval not displayed. Liver Function Tests:  Recent Labs Lab 10/28/14 0243 11/01/14 0534 11/02/14 0549  AST 10 19 17   ALT 6 9 9   ALKPHOS 65 56 57  BILITOT 0.5 0.4 0.4  PROT 4.7* 4.7* 4.7*  ALBUMIN 1.5* 1.5* 1.6*   CBC:  Recent Labs Lab 10/30/14 0615 10/31/14 0640 11/01/14 0534 11/02/14 0549 11/03/14 0516  WBC 10.7* 10.7* 9.4 10.0 8.9  NEUTROABS  --   --  6.1  --   --   HGB 8.0* 7.7* 7.2* 7.4* 7.0*  HCT 25.0* 24.3* 22.5* 23.8* 22.7*  MCV 92.6 92.7 93.4 94.1 94.6  PLT 752* 805* 674* 648* 543*   Scheduled Meds: . fluconazole  IV  100 mg Intravenous Q24H  . heparin  5,000 Units Subcutaneous 3 times per day  . magic mouthwash  10 mL Oral QID  . morphine  30 mg Oral Q12H  . ZOSYN  IV  3.375 g Intravenous Q8H  . vancomycin  125 mg Oral 4 times per day    Continuous Infusions: . Marland KitchenTPN (CLINIMIX-E) Adult     And  . fat emulsion    . Marland KitchenTPN (CLINIMIX-E) Adult     And  . fat emulsion    . 0.9 % sodium chloride with kcl 20 mL/hr at 11/02/14 1819

## 2014-11-03 NOTE — Progress Notes (Signed)
Patient ID: Vincent Black, male   DOB: 1970/10/18, 44 y.o.   MRN: [id number]         [facility]    Date of Admission:  10/26/2014           Day 9 IV antibiotics         Day 9 oral vancomycin  Principal Problem:   Pericolonic abscess Active Problems:   History of Clostridium difficile colitis   Odynophagia   Crohn's disease   Protein-calorie malnutrition, severe   Ankylosing spondylitis   Unintentional weight loss   Normocytic anemia   Left lower quadrant pain   . sodium chloride   Intravenous Once  . antiseptic oral rinse  7 mL Mouth Rinse q12n4p  . cefTRIAXone (ROCEPHIN)  IV  1 g Intravenous Q24H  . chlorhexidine  15 mL Mouth Rinse BID  . feeding supplement (RESOURCE BREEZE)  1 Container Oral TID BM  . fluconazole (DIFLUCAN) IV  100 mg Intravenous Q24H  . heparin  5,000 Units Subcutaneous 3 times per day  . HYDROmorphone PCA 0.3 mg/mL   Intravenous 6 times per day  . insulin aspart  0-9 Units Subcutaneous 4 times per day  . magic mouthwash  10 mL Oral QID  . metronidazole  500 mg Intravenous Q8H  . morphine  30 mg Oral Q12H  . pantoprazole (PROTONIX) IV  40 mg Intravenous Q24H  . vancomycin  125 mg Oral 4 times per day    Subjective: He has some discomfort around his abdominal drain but his abdominal pain is markedly improved. He is not having any diarrhea.  Review of Systems: Pertinent items are noted in HPI.  Past Medical History  Diagnosis Date  . Crohn disease   . C. difficile colitis 09/30/2014  . Ankylosing spondylitis 09/30/2014  . Anxiety     History  Substance Use Topics  . Smoking status: Former Smoker -- 0.50 packs/day for 10 years    Types: Cigarettes  . Smokeless tobacco: Never Used     Comment: 'quit smoking in ~ 2014"  . Alcohol Use: Yes     Comment: 10/25/2014 "might have a drink a couple times/yr"    History reviewed. No pertinent family history. No Known Allergies  OBJECTIVE: Blood pressure 110/79, pulse  100, temperature 98.5 F (36.9 C), temperature source Oral, resp. rate 15, height 5\' 7"  (1.702 m), weight 162 lb 11.2 oz (73.8 kg), SpO2 95 %. General: he is sleepy Abdomen: there is a small amount of brown fluid in his drain; 5 mL output recorded yesterday  Lab Results Lab Results  Component Value Date   WBC 8.9 11/03/2014   HGB 7.0* 11/03/2014   HCT 22.7* 11/03/2014   MCV 94.6 11/03/2014   PLT 543* 11/03/2014    Lab Results  Component Value Date   CREATININE 0.51 11/03/2014   BUN <5* 11/03/2014   NA 134* 11/03/2014   K 3.7 11/03/2014   CL 104 11/03/2014   CO2 25 11/03/2014    Lab Results  Component Value Date   ALT 9 11/02/2014   AST 17 11/02/2014   ALKPHOS 57 11/02/2014   BILITOT 0.4 11/02/2014     Microbiology: Recent Results (from the past 240 hour(s))  Culture, blood (routine x 2)     Status: None   Collection Time: 10/26/14 10:13 PM  Result Value Ref Range Status   Specimen Description BLOOD LEFT ARM  Final   Special Requests BOTTLES DRAWN AEROBIC AND ANAEROBIC 5CC  Final   Culture   Final    NO GROWTH 5 DAYS Performed at [facility]    Report Status 11/02/2014 FINAL  Final  Culture, blood (routine x 2)     Status: None   Collection Time: 10/26/14 10:15 PM  Result Value Ref Range Status   Specimen Description BLOOD LEFT HAND  Final   Special Requests   Final    BOTTLES DRAWN AEROBIC AND ANAEROBIC [place] BLUE 3CC PURPLE   Culture   Final    NO GROWTH 5 DAYS Performed at [facility]    Report Status 11/02/2014 FINAL  Final  Anaerobic culture     Status: None   Collection Time: 10/27/14  5:56 PM  Result Value Ref Range Status   Specimen Description ABSCESS ABDOMEN  Final   Special Requests NONE  Final   Gram Stain   Final    MODERATE WBC PRESENT,BOTH PMN AND MONONUCLEAR NO SQUAMOUS EPITHELIAL CELLS SEEN MODERATE GRAM POSITIVE COCCI IN PAIRS IN CHAINS IN CLUSTERS FEW GRAM NEGATIVE RODS Performed at [facility]    Culture    Final    NO ANAEROBES ISOLATED Performed at [facility]    Report Status 11/01/2014 FINAL  Final  Culture, routine-abscess     Status: None   Collection Time: 10/27/14  5:56 PM  Result Value Ref Range Status   Specimen Description ABSCESS ABDOMEN  Final   Special Requests NONE  Final   Gram Stain   Final    MODERATE WBC PRESENT,BOTH PMN AND MONONUCLEAR NO SQUAMOUS EPITHELIAL CELLS SEEN MODERATE GRAM POSITIVE COCCI IN PAIRS IN CHAINS IN CLUSTERS FEW GRAM NEGATIVE RODS Performed at [facility]    Culture   Final    ABUNDANT CITROBACTER FREUNDII ABUNDANT KLEBSIELLA OXYTOCA Performed at [facility]    Report Status 10/31/2014 FINAL  Final   Organism ID, Bacteria CITROBACTER FREUNDII  Final   Organism ID, Bacteria KLEBSIELLA OXYTOCA  Final      Susceptibility   Citrobacter freundii - MIC*    CEFAZOLIN >=64 RESISTANT Resistant     CEFEPIME <=1 SENSITIVE Sensitive     CEFTAZIDIME <=1 SENSITIVE Sensitive     CEFTRIAXONE <=1 SENSITIVE Sensitive     CIPROFLOXACIN <=0.25 SENSITIVE Sensitive     GENTAMICIN <=1 SENSITIVE Sensitive     IMIPENEM <=0.25 SENSITIVE Sensitive     PIP/TAZO <=4 SENSITIVE Sensitive     TOBRAMYCIN <=1 SENSITIVE Sensitive     TRIMETH/SULFA <=20 SENSITIVE Sensitive     * ABUNDANT CITROBACTER FREUNDII   Klebsiella oxytoca - MIC*    AMPICILLIN RESISTANT      AMPICILLIN/SULBACTAM 4 SENSITIVE Sensitive     CEFAZOLIN <=4 SENSITIVE Sensitive     CEFEPIME <=1 SENSITIVE Sensitive     CEFTAZIDIME <=1 SENSITIVE Sensitive     CEFTRIAXONE <=1 SENSITIVE Sensitive     CIPROFLOXACIN <=0.25 SENSITIVE Sensitive     GENTAMICIN <=1 SENSITIVE Sensitive     IMIPENEM <=0.25 SENSITIVE Sensitive     PIP/TAZO <=4 SENSITIVE Sensitive     TOBRAMYCIN <=1 SENSITIVE Sensitive     TRIMETH/SULFA <=20 SENSITIVE Sensitive     * ABUNDANT KLEBSIELLA OXYTOCA  Clostridium Difficile by PCR     Status: Abnormal   Collection Time: 10/29/14  9:58 AM  Result Value Ref  Range Status   C difficile by pcr POSITIVE (A) NEGATIVE Final    Comment: CRITICAL RESULT CALLED TO, READ BACK BY AND VERIFIED WITH: A.FORD,RN 10/30/14 @1420   BY V.WILKINS     Assessment: He is improving slowly on therapy for his polymicrobial paracolonic abscess. Although his repeat C. difficile PCR is positive he has no clinical evidence of relapse. I'm hopeful that the combination of IV metronidazole and oral vancomycin will prevent recurrence of his diarrhea.  Plan: 1. Continue current antibiotics 2. Please call my partner, Dr. Talbot Grumbling [phone]), for any infectious disease questions this weekend  Michel Bickers, MD [facility] [place] [phone] pager   [phone] cell 11/03/2014, 10:50 AM

## 2014-11-03 NOTE — Progress Notes (Signed)
PARENTERAL NUTRITION CONSULT NOTE - FOLLOW UP  Pharmacy Consult:  TPN Indication:  Sigmoid abscess with high likelihood of fistula formation  No Known Allergies  Patient Measurements: Height: 5\' 7"  (170.2 cm) Weight: 162 lb 11.2 oz (73.8 kg) IBW/kg (Calculated) : 66.1  Usual weight = 96 kg Current weight = 76 kg  Vital Signs: Temp: 98.5 F (36.9 C) (03/25 0537) Temp Source: Oral (03/25 0537) BP: 110/79 mmHg (03/25 0537) Pulse Rate: 100 (03/25 0537) Intake/Output from previous day: 03/24 0701 - 03/25 0700 In: 2638.5 [P.O.:1770; I.V.:508.5; IV Piggyback:350] Out: 2730 [Urine:2725; Drains:5]  Labs:  Recent Labs  11/01/14 0534 11/02/14 0549 11/03/14 0516  WBC 9.4 10.0 8.9  HGB 7.2* 7.4* 7.0*  HCT 22.5* 23.8* 22.7*  PLT 674* 648* 543*     Recent Labs  11/01/14 0534 11/02/14 0549 11/03/14 0516  NA 134* 134* 134*  K 3.1* 3.9 3.7  CL 99 103 104  CO2 32 26 25  GLUCOSE 121* 133* 119*  BUN <5* <5* <5*  CREATININE 0.62 0.54 0.51  CALCIUM 7.3* 7.3* 7.1*  MG 1.8 1.9  --   PHOS 2.7 2.6  --   PROT 4.7* 4.7*  --   ALBUMIN 1.5* 1.6*  --   AST 19 17  --   ALT 9 9  --   ALKPHOS 56 57  --   BILITOT 0.4 0.4  --   TRIG 115  --   --    Estimated Creatinine Clearance: 112.5 mL/min (by C-G formula based on Cr of 0.51).    Recent Labs  11/02/14 1724 11/03/14 0001 11/03/14 0608  GLUCAP 148* 116* 140*     Insulin Requirements in the past 24 hours:  3 units sensitive SSI  Assessment: 28 YOM with history of Crohn's and recent C.diff colitis presented on 10/26/14 with worsening abdominal pain.  CT showed sigmoid abscess and is s/p drain placement on 10/27/14 by IR.  Medical team suspects fistulous connection from colon to abscess cavity.  Given likelihood of surgery, high suspicion of fistula and poor nutritional status (baseline prealbumin at 4.3 and significant weight loss), Pharmacy consulted to manage TPN.  GI: Crohn's on Humira, Imuran, prednisone PTA.  Intra-abd  abscess drain yielded feculent material (resolved) >> improving and may not need surgery, started clears.   Baseline prealbumin 4.3.  Repeat CT in a couple of days Endo: no hx DM - CBGs controlled Lytes: mild hyponatremia, others WNL - watch Renal: SCr stable, CrCL 113 ml/min - good UOP 1.5 ml/kg/hr, NS30K at 60 ml/hr Cards: no hx - BP controlled, mild tachy Hepatobil: LFTs / tbili / TG WNL Neuro: hx anxiety and ankylosing spondylitis - weaning Dilaudid PCA + MS Contin, receiving PRN Ativan, pain score 4-6 ID: Diflucan and Zosyn changed to CTX/Flagyl for intra-abd abscess + PO Vanc for C.diff - afebrile, WBC WNL Best Practices: heparin SQ, MC TPN Access: PICC 10/31/14 TPN day#: 3 (3/22 >> )  Current Nutrition:  TPN + clear liquid diet Anticipated goal: Clinimix E 5/15 at 100 ml/hr + IVFE at 7 ml/hr = 2040 kCal and 120gm of protein per day  Nutritional Goals:  1900-2100 kCal, 100-120 grams of protein per day   Plan:  - Continue Clinimix E 5/15 at 83 ml/hr (goal 100 ml/hr) + continue IVFE at 7 ml/hr to provide 1750 kCal and 100gm protein per day.  Plan to maintain current rate given likelihood of diet advancement. - Daily multivitamin and trace elements - Continue sensitive SSI Q6H.  D/C if CBGs remain controlled at goal TPN rate. - F/U CT result, PO intake to wean off of TPN    Chaylee Ehrsam D. Mina Marble, PharmD, BCPS Pager:  [phone] 11/03/2014, 11:29 AM

## 2014-11-03 NOTE — Progress Notes (Signed)
Cross cover for Dr. Benson Norway Subjective: Events since admission noted; patient had 1 BM yesterday and one this morning. He has had abdominal cramping at the present time. He denies having any fevers or chills. On TPN. Recent stool studies positve for C.Difficle on 10/29/14.   Objective: Vital signs in last 24 hours: Temp:  [98.2 F (36.8 C)-99.9 F (37.7 C)] 99.9 F (37.7 C) (03/25 1331) Pulse Rate:  [100-118] 117 (03/25 1331) Resp:  [15-20] 20 (03/25 1331) BP: (110-127)/(77-85) 112/83 mmHg (03/25 1331) SpO2:  [93 %-99 %] 97 % (03/25 1331) Last BM Date: 11/02/14  Intake/Output from previous day: 03/24 0701 - 03/25 0700 In: 2638.5 [P.O.:1770; I.V.:508.5; IV Piggyback:350] Out: 2730 [Urine:2725; Drains:5] Intake/Output this shift: Total I/O In: 52 [Blood:30; Other:5] Out: -   General appearance: alert, cooperative, appears older than stated age and mild distress Resp: clear to auscultation bilaterally Cardio: regular rate and rhythm, S1, S2 normal, no murmur, click, rub or gallop GI: soft with LLQ tenderness on palpation with hyperactive bowel sounds; no masses,  no organomegaly Extremities: extremities normal, atraumatic, no cyanosis or edema  Lab Results:  Recent Labs  11/01/14 0534 11/02/14 0549 11/03/14 0516  WBC 9.4 10.0 8.9  HGB 7.2* 7.4* 7.0*  HCT 22.5* 23.8* 22.7*  PLT 674* 648* 543*   BMET  Recent Labs  11/01/14 0534 11/02/14 0549 11/03/14 0516  NA 134* 134* 134*  K 3.1* 3.9 3.7  CL 99 103 104  CO2 32 26 25  GLUCOSE 121* 133* 119*  BUN <5* <5* <5*  CREATININE 0.62 0.54 0.51  CALCIUM 7.3* 7.3* 7.1*   LFT  Recent Labs  11/02/14 0549  PROT 4.7*  ALBUMIN 1.6*  AST 17  ALT 9  ALKPHOS 57  BILITOT 0.4   Studies/Results: No results found.  Medications: I have reviewed the patient's current medications.  Assessment/Plan: 1) Polymicrobial pericolonic abscess, Crohn's disease and C. Difficle colitis:  On Flagyl and Vancomycin. We could stop the  PPI's if he does not really need it. He seems to be improving slowly.  Crohn's disease. 2) Severe protein-calorie malnutrition with weight loss-on TPN-albumin 1.6. 3) Ankylosing spondylitis. 4) Normocytic anemia.    LOS: 8 days   Kolby Schara 11/03/2014, 2:07 PM

## 2014-11-04 DIAGNOSIS — K50913 Crohn's disease, unspecified, with fistula: Secondary | ICD-10-CM

## 2014-11-04 LAB — CBC
HEMATOCRIT: 30.3 % — AB (ref 39.0–52.0)
Hemoglobin: 9.3 g/dL — ABNORMAL LOW (ref 13.0–17.0)
MCH: 28.4 pg (ref 26.0–34.0)
MCHC: 30.7 g/dL (ref 30.0–36.0)
MCV: 92.4 fL (ref 78.0–100.0)
Platelets: 569 10*3/uL — ABNORMAL HIGH (ref 150–400)
RBC: 3.28 MIL/uL — ABNORMAL LOW (ref 4.22–5.81)
RDW: 17.9 % — AB (ref 11.5–15.5)
WBC: 12.8 10*3/uL — ABNORMAL HIGH (ref 4.0–10.5)

## 2014-11-04 LAB — TYPE AND SCREEN
ABO/RH(D): O POS
ANTIBODY SCREEN: NEGATIVE
Unit division: 0

## 2014-11-04 LAB — BASIC METABOLIC PANEL
Anion gap: 6 (ref 5–15)
BUN: 7 mg/dL (ref 6–23)
CALCIUM: 8.2 mg/dL — AB (ref 8.4–10.5)
CHLORIDE: 101 mmol/L (ref 96–112)
CO2: 26 mmol/L (ref 19–32)
CREATININE: 0.59 mg/dL (ref 0.50–1.35)
GFR calc non Af Amer: >90 mL/min (ref >90)
Glucose, Bld: 142 mg/dL — ABNORMAL HIGH (ref 70–99)
Potassium: 4.4 mmol/L (ref 3.5–5.1)
Sodium: 133 mmol/L — ABNORMAL LOW (ref 135–145)

## 2014-11-04 LAB — GLUCOSE, CAPILLARY
Glucose-Capillary: 139 mg/dL — ABNORMAL HIGH (ref 70–99)
Glucose-Capillary: 153 mg/dL — ABNORMAL HIGH (ref 70–99)

## 2014-11-04 MED ORDER — TRACE MINERALS CR-CU-F-FE-I-MN-MO-SE-ZN IV SOLN
INTRAVENOUS | Status: AC
  Administered 2014-11-04: 18:00:00 via INTRAVENOUS
  Filled 2014-11-04: qty 960

## 2014-11-04 MED ORDER — FAT EMULSION 20 % IV EMUL
120.0000 mL | INTRAVENOUS | Status: AC
  Administered 2014-11-04: 120 mL via INTRAVENOUS
  Filled 2014-11-04: qty 250

## 2014-11-04 MED ORDER — OXYCODONE HCL 5 MG PO TABS
5.0000 mg | ORAL_TABLET | ORAL | Status: DC | PRN
Start: 2014-11-04 — End: 2014-11-08
  Administered 2014-11-04 – 2014-11-08 (×14): 15 mg via ORAL
  Filled 2014-11-04 (×15): qty 3

## 2014-11-04 MED ORDER — METHOCARBAMOL 500 MG PO TABS
1000.0000 mg | ORAL_TABLET | Freq: Three times a day (TID) | ORAL | Status: DC | PRN
  Administered 2014-11-05 – 2014-11-08 (×5): 1000 mg via ORAL
  Filled 2014-11-04 (×8): qty 2

## 2014-11-04 NOTE — Progress Notes (Signed)
[facility] Progress Note     Subjective: After CT feculent output increased from JP drain 30mL thick brown stool like output.  Pt denies significant pain.  No N/V.  Tolerating clears well.  Has pain with BM's, which he continues to have.  Urinating well.  Has not been OOB much.    Objective: Vital signs in last 24 hours: Temp:  [98 F (36.7 C)-100.7 F (38.2 C)] 99.8 F (37.7 C) (03/26 0511) Pulse Rate:  [108-118] 110 (03/26 0511) Resp:  [12-22] 16 (03/26 0511) BP: (112-137)/(77-92) 127/82 mmHg (03/26 0511) SpO2:  [94 %-98 %] 95 % (03/26 0511) FiO2 (%):  [0 %] 0 % (03/26 0400) Weight:  [69 kg (152 lb 1.9 oz)] 69 kg (152 lb 1.9 oz) (03/26 0500) Last BM Date: 11/02/14  Intake/Output from previous day: 03/25 0701 - 03/26 0700 In: 374 [P.O.:50; Blood:309] Out: 4035 [Urine:3850; Emesis/NG output:100; Drains:85] Intake/Output this shift:    PE: Gen:  Alert, NAD, pleasant Abd: Soft, minimal distension, NT, +BS, no HSM, IR perc drain in place in LLQ with brown thick feculent output   Lab Results:   Recent Labs  11/03/14 0516 11/04/14 0545  WBC 8.9 12.8*  HGB 7.0* 9.3*  HCT 22.7* 30.3*  PLT 543* 569*   BMET  Recent Labs  11/03/14 0516 11/04/14 0545  NA 134* 133*  K 3.7 4.4  CL 104 101  CO2 25 26  GLUCOSE 119* 142*  BUN <5* 7  CREATININE 0.51 0.59  CALCIUM 7.1* 8.2*   PT/INR No results for input(s): LABPROT, INR in the last 72 hours. CMP     Component Value Date/Time   NA 133* 11/04/2014 0545   K 4.4 11/04/2014 0545   CL 101 11/04/2014 0545   CO2 26 11/04/2014 0545   GLUCOSE 142* 11/04/2014 0545   BUN 7 11/04/2014 0545   CREATININE 0.59 11/04/2014 0545   CALCIUM 8.2* 11/04/2014 0545   PROT 4.7* 11/02/2014 0549   ALBUMIN 1.6* 11/02/2014 0549   AST 17 11/02/2014 0549   ALT 9 11/02/2014 0549   ALKPHOS 57 11/02/2014 0549   BILITOT 0.4 11/02/2014 0549   GFRNONAA >90 11/04/2014 0545   GFRAA >90 11/04/2014 0545   Lipase  No results found  for: LIPASE     Studies/Results: Ct Abdomen Pelvis W Contrast  11/03/2014   CLINICAL DATA:  44 year old male with continued abdominal pain and low-grade fevers after drain placement. History of Crohn's disease.  EXAM: CT ABDOMEN AND PELVIS WITH CONTRAST  TECHNIQUE: Multidetector CT imaging of the abdomen and pelvis was performed using the standard protocol following bolus administration of intravenous contrast.  CONTRAST:  175mL OMNIPAQUE IOHEXOL 300 MG/ML  SOLN  COMPARISON:  CT the abdomen and pelvis 10/27/2014.  FINDINGS: Lower chest: Small left and trace right-sided pleural effusions lying dependently. Dependent subsegmental atelectasis in the lower lobes of the lungs bilaterally. Central venous catheter tip terminating at the superior cavoatrial junction.  Hepatobiliary: Sub cm low-attenuation lesion in segment 5 of the liver is too small to characterize, but is unchanged compared to prior studies, favored to represent a tiny cyst. No other aggressive appearing cystic or solid hepatic lesions are noted. No intra or extrahepatic biliary ductal dilatation. Gallbladder is normal in appearance.  Pancreas: Unremarkable.  Spleen: Unremarkable.  Adrenals/Urinary Tract: Bilateral adrenal glands and bilateral kidneys are normal in appearance. No hydroureteronephrosis. Urinary bladder is normal in appearance.  Stomach/Bowel: Previously placed pigtail drainage catheter is in a collection in the left lower  quadrant of the abdomen that still contains some gas and fluid, measuring approximately 4.9 x 2.8 x 3.6 cm (images 70 of series 2, coronal image 34, and sagittal image 82), significantly smaller than the prior examination. This is intimately associated with the superior aspect of the proximal sigmoid colon, and several adjacent mid small bowel loops (presumably jejunal). One of the adjacent small bowel loops appears mildly thickened, best appreciated on image 69 of series 2. Additionally, there is extensive  thickening of the colon in the region of the sigmoid colon, most severe in the proximal sigmoid colon, where there is also surrounding inflammatory changes in the adjacent sigmoid mesocolon, and hypervascularity in the sigmoid mesocolon. Dilatation of the more proximal aspects of the proximal to mid small bowel, with bowel loops measuring up to 4.4 cm in diameter, containing multiple air-fluid levels. Distal small bowel is relatively decompressed, but not completely decompressed, and there is gas, fluid and stool throughout the colon. The appearance of the stomach and appendix are normal. Terminal ileum is unremarkable in appearance.  Vascular/Lymphatic: Mild atherosclerosis in the abdominal and pelvic vasculature, without evidence of aneurysm, dissection or major branch occlusion. Numerous reactive size lymph nodes are noted in the sigmoid mesocolon. No definite pathologically enlarged lymph nodes are noted in the abdomen or pelvis.  Reproductive: Prostate gland and seminal vesicles are unremarkable in appearance.  Other: Trace volume of ascites.  No pneumoperitoneum.  Musculoskeletal: There are no aggressive appearing lytic or blastic lesions noted in the visualized portions of the skeleton.  IMPRESSION: 1. Interval placement of a pigtail drainage catheter within a peritoneal abscess in the left lower quadrant of the abdomen which is intimately associated with adjacent proximal sigmoid colon and adjacent jejunal small bowel loops. While there is some dilatation of the proximal to mid small bowel, with multiple air-fluid levels, this is favored to reflect a reactive ileus rather than a bowel obstruction based on findings on today's examination. The size of the abscess has significantly decreased following placement of the drainage catheter, although there is still residual fluid and gas within the collection. No new abscesses are identified on today's examination. Trace volume of ascites, presumably reactive. 2.  Extensive thickening of the sigmoid colon wall, and jejunum, presumably related to areas of activity in this patient with history of Crohn's disease. 3. No pneumoperitoneum. 4. Small left and trace right pleural effusions layering dependently. 5. Additional incidental findings, as above.   Electronically Signed   By: Vinnie Langton M.D.   On: 11/03/2014 17:49    Anti-infectives: Anti-infectives    Start     Dose/Rate Route Frequency Ordered Stop   10/31/14 2000  cefTRIAXone (ROCEPHIN) 1 g in dextrose 5 % 50 mL IVPB - Premix     1 g 100 mL/hr over 30 Minutes Intravenous Every 24 hours 10/31/14 1855     10/31/14 2000  metroNIDAZOLE (FLAGYL) IVPB 500 mg     500 mg 100 mL/hr over 60 Minutes Intravenous Every 8 hours 10/31/14 1855     10/28/14 1200  piperacillin-tazobactam (ZOSYN) IVPB 3.375 g  Status:  Discontinued     3.375 g 12.5 mL/hr over 240 Minutes Intravenous Every 8 hours 10/28/14 1021 10/31/14 1855   10/26/14 2200  metroNIDAZOLE (FLAGYL) IVPB 500 mg  Status:  Discontinued     500 mg 100 mL/hr over 60 Minutes Intravenous Every 8 hours 10/26/14 2015 10/28/14 0943   10/26/14 2100  ciprofloxacin (CIPRO) IVPB 400 mg  Status:  Discontinued  400 mg 200 mL/hr over 60 Minutes Intravenous Every 12 hours 10/26/14 2027 10/28/14 1021   10/26/14 1900  fluconazole (DIFLUCAN) IVPB 100 mg  Status:  Discontinued     100 mg 50 mL/hr over 60 Minutes Intravenous Every 24 hours 10/26/14 1805 11/03/14 1251   10/26/14 1800  vancomycin (VANCOCIN) 50 mg/mL oral solution 125 mg     125 mg Oral 4 times per day 10/26/14 1655         Assessment/Plan HD #9 Intra-abdominal abscess with crohn's disease -On Rocephin and Flagyl, along with oral vancomycin -IR placed a perc drain 10/27/14. The patient may have a fistulous connection from his colon to this abscess cavity CXs show Citrobacter freundii & Klebsiella oxytoca sensitive to Rocephin and Flagyl  -TPN/PICC for protein calorie malnutrition, cut back  on TPN to 1/2 dose -CT shows improvement in abscess, start fulls, monitor drain output closely -WBC stable at 8.9 today. Continue to monitor with conservative management.  -Would try to avoid surgery in this patient right now as much as possible unless he deteriorated -D/c PCA, start oral Oxy IR, takes 30mg  BID MS Contin home dose -Needs to ambulate!!! Hypoalbuminemia/low prealbumin - Starting TPN -Albumin is 1.6. Patient states he was eating great prior to admission; however, this is very low for a 44 yo male. Prealbumin is 4.3. Insert Picc, start TPN given malnourished state currently, potentially surgery if not improving. Recurrent C.diff - stool still positive 10/29/14 -Oral Vanc, ID following    LOS: 9 days    Vincent Black, Vincent Black 11/04/2014, 8:44 AM Pager: [phone]

## 2014-11-04 NOTE — Progress Notes (Signed)
Cross cover for Dr. Benson Norway. Subjective: Since I last evaluated the patient, he seems to be doing slightly better. He had some feculent material drain through the percutaneous pigtail catheter in the LLQ. Still has abdominal cramping with BM's but he claims it is better today. He had one BM this morning at about 4 AM and one BM yesterday. Denies having any fever or chills. WBC slightly elevated today at 12.8 with a hemoglobin of 9.3 gm/dl.   Objective: Vital signs in last 24 hours: Temp:  [98 F (36.7 C)-100.7 F (38.2 C)] 99.8 F (37.7 C) (03/26 0511) Pulse Rate:  [108-118] 110 (03/26 0511) Resp:  [12-22] 16 (03/26 0511) BP: (112-137)/(77-92) 127/82 mmHg (03/26 0511) SpO2:  [94 %-98 %] 95 % (03/26 0511) FiO2 (%):  [0 %] 0 % (03/26 0400) Weight:  [69 kg (152 lb 1.9 oz)] 69 kg (152 lb 1.9 oz) (03/26 0500) Last BM Date: 11/02/14  Intake/Output from previous day: 03/25 0701 - 03/26 0700 In: 374 [P.O.:50; Blood:309] Out: 4035 [Urine:3850; Emesis/NG output:100; Drains:85] Intake/Output this shift:   General appearance: appears stated age, cachectic, fatigued, mild distress and pale Resp: clear to auscultation bilaterally Cardio: regular rate and rhythm, S1, S2 normal, no murmur, click, rub or gallop GI: soft with LLQ tenderness on palpation with hypoactive bowel sounds; no masses,  no organomegaly; drain present in LLQ Extremities: extremities normal, atraumatic, no cyanosis or edema  Lab Results:  Recent Labs  11/02/14 0549 11/03/14 0516 11/04/14 0545  WBC 10.0 8.9 12.8*  HGB 7.4* 7.0* 9.3*  HCT 23.8* 22.7* 30.3*  PLT 648* 543* 569*   BMET  Recent Labs  11/02/14 0549 11/03/14 0516 11/04/14 0545  NA 134* 134* 133*  K 3.9 3.7 4.4  CL 103 104 101  CO2 26 25 26   GLUCOSE 133* 119* 142*  BUN <5* <5* 7  CREATININE 0.54 0.51 0.59  CALCIUM 7.3* 7.1* 8.2*   LFT  Recent Labs  11/02/14 0549  PROT 4.7*  ALBUMIN 1.6*  AST 17  ALT 9  ALKPHOS 57  BILITOT 0.4    Studies/Results: Ct Abdomen Pelvis W Contrast  11/03/2014   CLINICAL DATA:  44 year old male with continued abdominal pain and low-grade fevers after drain placement. History of Crohn's disease.  EXAM: CT ABDOMEN AND PELVIS WITH CONTRAST  TECHNIQUE: Multidetector CT imaging of the abdomen and pelvis was performed using the standard protocol following bolus administration of intravenous contrast.  CONTRAST:  148mL OMNIPAQUE IOHEXOL 300 MG/ML  SOLN  COMPARISON:  CT the abdomen and pelvis 10/27/2014.  FINDINGS: Lower chest: Small left and trace right-sided pleural effusions lying dependently. Dependent subsegmental atelectasis in the lower lobes of the lungs bilaterally. Central venous catheter tip terminating at the superior cavoatrial junction.  Hepatobiliary: Sub cm low-attenuation lesion in segment 5 of the liver is too small to characterize, but is unchanged compared to prior studies, favored to represent a tiny cyst. No other aggressive appearing cystic or solid hepatic lesions are noted. No intra or extrahepatic biliary ductal dilatation. Gallbladder is normal in appearance.  Pancreas: Unremarkable.  Spleen: Unremarkable.  Adrenals/Urinary Tract: Bilateral adrenal glands and bilateral kidneys are normal in appearance. No hydroureteronephrosis. Urinary bladder is normal in appearance.  Stomach/Bowel: Previously placed pigtail drainage catheter is in a collection in the left lower quadrant of the abdomen that still contains some gas and fluid, measuring approximately 4.9 x 2.8 x 3.6 cm (images 70 of series 2, coronal image 34, and sagittal image 82), significantly smaller than the  prior examination. This is intimately associated with the superior aspect of the proximal sigmoid colon, and several adjacent mid small bowel loops (presumably jejunal). One of the adjacent small bowel loops appears mildly thickened, best appreciated on image 69 of series 2. Additionally, there is extensive thickening of the colon  in the region of the sigmoid colon, most severe in the proximal sigmoid colon, where there is also surrounding inflammatory changes in the adjacent sigmoid mesocolon, and hypervascularity in the sigmoid mesocolon. Dilatation of the more proximal aspects of the proximal to mid small bowel, with bowel loops measuring up to 4.4 cm in diameter, containing multiple air-fluid levels. Distal small bowel is relatively decompressed, but not completely decompressed, and there is gas, fluid and stool throughout the colon. The appearance of the stomach and appendix are normal. Terminal ileum is unremarkable in appearance.  Vascular/Lymphatic: Mild atherosclerosis in the abdominal and pelvic vasculature, without evidence of aneurysm, dissection or major branch occlusion. Numerous reactive size lymph nodes are noted in the sigmoid mesocolon. No definite pathologically enlarged lymph nodes are noted in the abdomen or pelvis.  Reproductive: Prostate gland and seminal vesicles are unremarkable in appearance.  Other: Trace volume of ascites.  No pneumoperitoneum.  Musculoskeletal: There are no aggressive appearing lytic or blastic lesions noted in the visualized portions of the skeleton.  IMPRESSION: 1. Interval placement of a pigtail drainage catheter within a peritoneal abscess in the left lower quadrant of the abdomen which is intimately associated with adjacent proximal sigmoid colon and adjacent jejunal small bowel loops. While there is some dilatation of the proximal to mid small bowel, with multiple air-fluid levels, this is favored to reflect a reactive ileus rather than a bowel obstruction based on findings on today's examination. The size of the abscess has significantly decreased following placement of the drainage catheter, although there is still residual fluid and gas within the collection. No new abscesses are identified on today's examination. Trace volume of ascites, presumably reactive. 2. Extensive thickening of  the sigmoid colon wall, and jejunum, presumably related to areas of activity in this patient with history of Crohn's disease. 3. No pneumoperitoneum. 4. Small left and trace right pleural effusions layering dependently. 5. Additional incidental findings, as above.   Electronically Signed   By: Vinnie Langton M.D.   On: 11/03/2014 17:49   Medications: I have reviewed the patient's current medications.  Assessment and plan 1) Crohn's disease complicated by C. Difficle colitis and an intrabdominal abscess- On Rocephin and Flagyl & oral vancomycin; -IR placed a percutaneous drain 10/27/14; there is a question of a fistulous connection from his colon to this abscess; cultures from the abscess  Citrobacter freundii & Klebsiella oxytoca sensitive to Rocephin and Flagyl. CT scan done last night shows improvement in the size of the abscess. 2) Severe malnutrition with hypoalbuminemia/Abnormal weight loss on TPN. 3) Anemia of chronic disease. 4) Ankylosing spondylitis. 5) Hyperglycemia: needs close followup.    LOS: 9 days   Emagene Merfeld 11/04/2014, 8:16 AM

## 2014-11-04 NOTE — Progress Notes (Addendum)
PARENTERAL NUTRITION CONSULT NOTE - FOLLOW UP  Pharmacy Consult:  TPN Indication:  Sigmoid abscess with high likelihood of fistula formation  No Known Allergies  Patient Measurements: Height: 5\' 7"  (170.2 cm) Weight: 152 lb 1.9 oz (69 kg) IBW/kg (Calculated) : 66.1  Usual weight = 96 kg Current weight = 76 kg  Vital Signs: Temp: 99.8 F (37.7 C) (03/26 0511) Temp Source: Oral (03/26 0511) BP: 127/82 mmHg (03/26 0511) Pulse Rate: 110 (03/26 0511) Intake/Output from previous day: 03/25 0701 - 03/26 0700 In: 374 [P.O.:50; Blood:309] Out: 4035 [Urine:3850; Emesis/NG output:100; Drains:85]  Labs:  Recent Labs  11/02/14 0549 11/03/14 0516 11/04/14 0545  WBC 10.0 8.9 12.8*  HGB 7.4* 7.0* 9.3*  HCT 23.8* 22.7* 30.3*  PLT 648* 543* 569*     Recent Labs  11/02/14 0549 11/03/14 0516 11/04/14 0545  NA 134* 134* 133*  K 3.9 3.7 4.4  CL 103 104 101  CO2 26 25 26   GLUCOSE 133* 119* 142*  BUN <5* <5* 7  CREATININE 0.54 0.51 0.59  CALCIUM 7.3* 7.1* 8.2*  MG 1.9  --   --   PHOS 2.6  --   --   PROT 4.7*  --   --   ALBUMIN 1.6*  --   --   AST 17  --   --   ALT 9  --   --   ALKPHOS 57  --   --   BILITOT 0.4  --   --    Estimated Creatinine Clearance: 112.5 mL/min (by C-G formula based on Cr of 0.59).    Recent Labs  11/03/14 1738 11/04/14 0009 11/04/14 0519  GLUCAP 113* 139* 153*     Insulin Requirements in the past 24 hours:  2 units sensitive SSI  Assessment: 52 YOM with history of Crohn's and recent C.diff colitis presented on 10/26/14 with worsening abdominal pain.  CT showed sigmoid abscess and is s/p drain placement on 10/27/14 by IR.  Medical team suspects fistulous connection from colon to abscess cavity.  Given likelihood of surgery, high suspicion of fistula and poor nutritional status (baseline prealbumin at 4.3 and significant weight loss), Pharmacy consulted to manage TPN.  GI: Crohn's on Humira, Imuran, prednisone PTA.  Intra-abd abscess drain  yielded feculent material (resolved) >> improving and may not need surgery, advancing diet.   Baseline prealbumin 4.3.  Repeat CT on 3/25 showed reactive ileus instead of bowel obstruction, no abscess. Endo: no hx DM - CBGs acceptable with minimal SSI use Lytes: mild hyponatremia, others WNL - watch Renal: SCr stable, CrCL 113 ml/min - good UOP 2.3 ml/kg/hr, NS30K at 20 ml/hr Cards: no hx - BP controlled, tachy Hepatobil: LFTs / tbili / TG WNL Neuro: hx anxiety and ankylosing spondylitis - weaning Dilaudid PCA + MS Contin, receiving PRN Ativan, pain score 2-3 ID: Diflucan and Zosyn changed to CTX/Flagyl for intra-abd abscess + PO Vanc for C.diff - Tmax 100.7, WBC increased to 12.8 Best Practices: heparin SQ, MC TPN Access: PICC 10/31/14 TPN day#: 4 (3/22 >> )  Current Nutrition:  TPN + full liquid diet Anticipated goal: Clinimix E 5/15 at 100 ml/hr + IVFE at 7 ml/hr = 2040 kCal and 120gm of protein per day  Nutritional Goals:  1900-2100 kCal, 100-120 grams of protein per day   Plan:  - Decrease Clinimix E 5/15 to 40 ml/hr + decrease IVFE to 5 ml/hr.  Weaning TPN as diet being advanced per PA. - Daily multivitamin and trace elements -  D/C SSI/CBG checks - F/U diet tolerance and possibility of discontinuing TPN in AM    Nuri Branca D. Mina Marble, PharmD, BCPS Pager:  [phone] 11/04/2014, 9:28 AM

## 2014-11-04 NOTE — Progress Notes (Signed)
PATIENT DETAILS Name: Vincent Black Age: 44 y.o. Sex: male Date of Birth: 1971-05-03 Admit Date: 10/26/2014 Admitting Physician Reyne Dumas, MD PCP:No PCP Per Patient  Brief narrative:  44 year old male with history of Crohn's disease, recently discharged from this hospital on 10/10/14 after being treated for C. difficile colitis. Patient presented to the hospital on 3/17 with abdominal pain. CT of the abdomen showed sigmoid abscess. Patient was then admitted for further evaluation and treatment. Patient was started on empiric antibiotics, kept nothing by mouth, gastrology, surgery interventional radiology was consulted. She underwent IR drain placement on 3/18. Infectious disease directed antibiotic regimen. Has been started on TNA. Repeat CT of the abdomen on 3/25 she was significant improvement  Subjective: No major complaints this am.  Assessment/Plan: Principal Problem:   Sepsis:sepsis pathophysiology has resolved. Secondary to intra-abdominal abscess. See below for Abx and cultures  Active Problems:   Pericolonic abscess: underwent pec drain placement on 3/18, cultures positive for Citrobacter and Klebsiella. ID consulted and directing Abx. Currently on Rocephin. IR, GI and CCS following. Repeat CT Abd on 3/25 shows significant improvement.Pig tail drain in place. Gen Surgery has started patient on full liquids. Continues to be on TNA as well. As clinically improves, hopefully will be able to wean off TNA.    C Diff Colitis:On empiric oral Vanco and IV Flagyl    Crohn's disease: complicated by development of possible fistulous connection between abscess and colon. GI/CCS following. Abx as above. Currently not on any anti-inflammatories/immunosuppressives-which remain on hold.     Protein-calorie malnutrition, severe: Continue with TNA/oral supplements    Hyponatremia: mild, monitor for now    Chronic Odynophagia:completed a course of fluconazole.    Chronic Pain  Syndrome:MS Continue resumed, follow and adjust dosing.    Anemia: secondary to acute on chronic illness, transfused PRBC on 3/25. Follow CBC  Disposition: Remain inpatient  Antibiotics:  See below   Anti-infectives    Start     Dose/Rate Route Frequency Ordered Stop   10/31/14 2000  cefTRIAXone (ROCEPHIN) 1 g in dextrose 5 % 50 mL IVPB - Premix     1 g 100 mL/hr over 30 Minutes Intravenous Every 24 hours 10/31/14 1855     10/31/14 2000  metroNIDAZOLE (FLAGYL) IVPB 500 mg     500 mg 100 mL/hr over 60 Minutes Intravenous Every 8 hours 10/31/14 1855     10/28/14 1200  piperacillin-tazobactam (ZOSYN) IVPB 3.375 g  Status:  Discontinued     3.375 g 12.5 mL/hr over 240 Minutes Intravenous Every 8 hours 10/28/14 1021 10/31/14 1855   10/26/14 2200  metroNIDAZOLE (FLAGYL) IVPB 500 mg  Status:  Discontinued     500 mg 100 mL/hr over 60 Minutes Intravenous Every 8 hours 10/26/14 2015 10/28/14 0943   10/26/14 2100  ciprofloxacin (CIPRO) IVPB 400 mg  Status:  Discontinued     400 mg 200 mL/hr over 60 Minutes Intravenous Every 12 hours 10/26/14 2027 10/28/14 1021   10/26/14 1900  fluconazole (DIFLUCAN) IVPB 100 mg  Status:  Discontinued     100 mg 50 mL/hr over 60 Minutes Intravenous Every 24 hours 10/26/14 1805 11/03/14 1251   10/26/14 1800  vancomycin (VANCOCIN) 50 mg/mL oral solution 125 mg     125 mg Oral 4 times per day 10/26/14 1655        DVT Prophylaxis: Prophylactic Heparin   Code Status: Full code   Family Communication Spouse at bedside  Procedures:  Perc Drain 3/18  CONSULTS:  ID, GI, general surgery and IR  Time spent 40 minutes-which includes 50% of the time with face-to-face with patient/ family and coordinating care related to the above assessment and plan.  MEDICATIONS: Scheduled Meds: . sodium chloride   Intravenous Once  . cefTRIAXone (ROCEPHIN)  IV  1 g Intravenous Q24H  . feeding supplement (RESOURCE BREEZE)  1 Container Oral TID BM  . heparin   5,000 Units Subcutaneous 3 times per day  . magic mouthwash  10 mL Oral QID  . metronidazole  500 mg Intravenous Q8H  . morphine  30 mg Oral Q12H  . pantoprazole (PROTONIX) IV  40 mg Intravenous Q24H  . vancomycin  125 mg Oral 4 times per day   Continuous Infusions: . Marland KitchenTPN (CLINIMIX-E) Adult 83 mL/hr at 11/03/14 1808   And  . fat emulsion 168 mL (11/03/14 1809)  . Marland KitchenTPN (CLINIMIX-E) Adult     And  . fat emulsion    . 0.9 % sodium chloride with kcl 20 mL/hr at 11/03/14 1527   PRN Meds:.acetaminophen, LORazepam, methocarbamol, ondansetron (ZOFRAN) IV, oxyCODONE, sodium chloride    PHYSICAL EXAM: Vital signs in last 24 hours: Filed Vitals:   11/04/14 0400 11/04/14 0500 11/04/14 0511 11/04/14 1017  BP:   127/82 110/74  Pulse:   110   Temp:   99.8 F (37.7 C) 98.4 F (36.9 C)  TempSrc:   Oral Oral  Resp: 12  16 14   Height:      Weight:  69 kg (152 lb 1.9 oz)    SpO2: 98%  95% 100%    Weight change:  Filed Weights   10/31/14 0400 11/01/14 0447 11/04/14 0500  Weight: 75.8 kg (167 lb 1.7 oz) 73.8 kg (162 lb 11.2 oz) 69 kg (152 lb 1.9 oz)   Body mass index is 23.82 kg/(m^2).   Gen Exam: Awake and alert with clear speech.   Neck: Supple, No JVD.   Chest: B/L Clear.   CVS: S1 S2 Regular, no murmurs.  Abdomen: soft, BS +, non tender, non distended.  Extremities: no edema, lower extremities warm to touch. Neurologic: Non Focal.   Skin: No Rash.   Wounds: N/A.    Intake/Output from previous day:  Intake/Output Summary (Last 24 hours) at 11/04/14 1112 Last data filed at 11/04/14 0920  Gross per 24 hour  Intake    489 ml  Output   4035 ml  Net  -3546 ml     LAB RESULTS: CBC  Recent Labs Lab 10/31/14 0640 11/01/14 0534 11/02/14 0549 11/03/14 0516 11/04/14 0545  WBC 10.7* 9.4 10.0 8.9 12.8*  HGB 7.7* 7.2* 7.4* 7.0* 9.3*  HCT 24.3* 22.5* 23.8* 22.7* 30.3*  PLT 805* 674* 648* 543* 569*  MCV 92.7 93.4 94.1 94.6 92.4  MCH 29.4 29.9 29.2 29.2 28.4  MCHC 31.7  32.0 31.1 30.8 30.7  RDW 16.4* 16.4* 17.0* 17.1* 17.9*  LYMPHSABS  --  2.4  --   --   --   MONOABS  --  0.7  --   --   --   EOSABS  --  0.2  --   --   --   BASOSABS  --  0.0  --   --   --     Chemistries   Recent Labs Lab 10/29/14 0500  10/31/14 0640 11/01/14 0534 11/02/14 0549 11/03/14 0516 11/04/14 0545  NA  --   < > 131* 134* 134* 134* 133*  K  --   < >  4.0 3.1* 3.9 3.7 4.4  CL  --   < > 94* 99 103 104 101  CO2  --   < > 30 32 26 25 26   GLUCOSE  --   < > 90 121* 133* 119* 142*  BUN  --   < > <5* <5* <5* <5* 7  CREATININE  --   < > 0.80 0.62 0.54 0.51 0.59  CALCIUM  --   < > 7.3* 7.3* 7.3* 7.1* 8.2*  MG 1.8  --  1.9 1.8 1.9  --   --   < > = values in this interval not displayed.  CBG:  Recent Labs Lab 11/03/14 0608 11/03/14 1155 11/03/14 1738 11/04/14 0009 11/04/14 0519  GLUCAP 140* 135* 113* 139* 153*    GFR Estimated Creatinine Clearance: 112.5 mL/min (by C-G formula based on Cr of 0.59).  Coagulation profile No results for input(s): INR, PROTIME in the last 168 hours.  Cardiac Enzymes No results for input(s): CKMB, TROPONINI, MYOGLOBIN in the last 168 hours.  Invalid input(s): CK  Invalid input(s): POCBNP No results for input(s): DDIMER in the last 72 hours. No results for input(s): HGBA1C in the last 72 hours. No results for input(s): CHOL, HDL, LDLCALC, TRIG, CHOLHDL, LDLDIRECT in the last 72 hours. No results for input(s): TSH, T4TOTAL, T3FREE, THYROIDAB in the last 72 hours.  Invalid input(s): FREET3 No results for input(s): VITAMINB12, FOLATE, FERRITIN, TIBC, IRON, RETICCTPCT in the last 72 hours. No results for input(s): LIPASE, AMYLASE in the last 72 hours.  Urine Studies No results for input(s): UHGB, CRYS in the last 72 hours.  Invalid input(s): UACOL, UAPR, USPG, UPH, UTP, UGL, UKET, UBIL, UNIT, UROB, ULEU, UEPI, UWBC, URBC, UBAC, CAST, UCOM, BILUA  MICROBIOLOGY: Recent Results (from the past 240 hour(s))  Culture, blood (routine x  2)     Status: None   Collection Time: 10/26/14 10:13 PM  Result Value Ref Range Status   Specimen Description BLOOD LEFT ARM  Final   Special Requests BOTTLES DRAWN AEROBIC AND ANAEROBIC 5CC  Final   Culture   Final    NO GROWTH 5 DAYS Performed at [facility]    Report Status 11/02/2014 FINAL  Final  Culture, blood (routine x 2)     Status: None   Collection Time: 10/26/14 10:15 PM  Result Value Ref Range Status   Specimen Description BLOOD LEFT HAND  Final   Special Requests   Final    BOTTLES DRAWN AEROBIC AND ANAEROBIC [place] BLUE 3CC PURPLE   Culture   Final    NO GROWTH 5 DAYS Performed at [facility]    Report Status 11/02/2014 FINAL  Final  Anaerobic culture     Status: None   Collection Time: 10/27/14  5:56 PM  Result Value Ref Range Status   Specimen Description ABSCESS ABDOMEN  Final   Special Requests NONE  Final   Gram Stain   Final    MODERATE WBC PRESENT,BOTH PMN AND MONONUCLEAR NO SQUAMOUS EPITHELIAL CELLS SEEN MODERATE GRAM POSITIVE COCCI IN PAIRS IN CHAINS IN CLUSTERS FEW GRAM NEGATIVE RODS Performed at [facility]    Culture   Final    NO ANAEROBES ISOLATED Performed at [facility]    Report Status 11/01/2014 FINAL  Final  Culture, routine-abscess     Status: None   Collection Time: 10/27/14  5:56 PM  Result Value Ref Range Status   Specimen Description ABSCESS ABDOMEN  Final   Special  Requests NONE  Final   Gram Stain   Final    MODERATE WBC PRESENT,BOTH PMN AND MONONUCLEAR NO SQUAMOUS EPITHELIAL CELLS SEEN MODERATE GRAM POSITIVE COCCI IN PAIRS IN CHAINS IN CLUSTERS FEW GRAM NEGATIVE RODS Performed at [facility]    Culture   Final    ABUNDANT CITROBACTER FREUNDII ABUNDANT KLEBSIELLA OXYTOCA Performed at [facility]    Report Status 10/31/2014 FINAL  Final   Organism ID, Bacteria CITROBACTER FREUNDII  Final   Organism ID, Bacteria KLEBSIELLA OXYTOCA  Final      Susceptibility    Citrobacter freundii - MIC*    CEFAZOLIN >=64 RESISTANT Resistant     CEFEPIME <=1 SENSITIVE Sensitive     CEFTAZIDIME <=1 SENSITIVE Sensitive     CEFTRIAXONE <=1 SENSITIVE Sensitive     CIPROFLOXACIN <=0.25 SENSITIVE Sensitive     GENTAMICIN <=1 SENSITIVE Sensitive     IMIPENEM <=0.25 SENSITIVE Sensitive     PIP/TAZO <=4 SENSITIVE Sensitive     TOBRAMYCIN <=1 SENSITIVE Sensitive     TRIMETH/SULFA <=20 SENSITIVE Sensitive     * ABUNDANT CITROBACTER FREUNDII   Klebsiella oxytoca - MIC*    AMPICILLIN RESISTANT      AMPICILLIN/SULBACTAM 4 SENSITIVE Sensitive     CEFAZOLIN <=4 SENSITIVE Sensitive     CEFEPIME <=1 SENSITIVE Sensitive     CEFTAZIDIME <=1 SENSITIVE Sensitive     CEFTRIAXONE <=1 SENSITIVE Sensitive     CIPROFLOXACIN <=0.25 SENSITIVE Sensitive     GENTAMICIN <=1 SENSITIVE Sensitive     IMIPENEM <=0.25 SENSITIVE Sensitive     PIP/TAZO <=4 SENSITIVE Sensitive     TOBRAMYCIN <=1 SENSITIVE Sensitive     TRIMETH/SULFA <=20 SENSITIVE Sensitive     * ABUNDANT KLEBSIELLA OXYTOCA  Clostridium Difficile by PCR     Status: Abnormal   Collection Time: 10/29/14  9:58 AM  Result Value Ref Range Status   C difficile by pcr POSITIVE (A) NEGATIVE Final    Comment: CRITICAL RESULT CALLED TO, READ BACK BY AND VERIFIED WITH: A.FORD,RN 10/30/14 @1420  BY V.WILKINS     RADIOLOGY STUDIES/RESULTS: Ct Abdomen Pelvis W Contrast  11/03/2014   CLINICAL DATA:  44 year old male with continued abdominal pain and low-grade fevers after drain placement. History of Crohn's disease.  EXAM: CT ABDOMEN AND PELVIS WITH CONTRAST  TECHNIQUE: Multidetector CT imaging of the abdomen and pelvis was performed using the standard protocol following bolus administration of intravenous contrast.  CONTRAST:  115mL OMNIPAQUE IOHEXOL 300 MG/ML  SOLN  COMPARISON:  CT the abdomen and pelvis 10/27/2014.  FINDINGS: Lower chest: Small left and trace right-sided pleural effusions lying dependently. Dependent subsegmental  atelectasis in the lower lobes of the lungs bilaterally. Central venous catheter tip terminating at the superior cavoatrial junction.  Hepatobiliary: Sub cm low-attenuation lesion in segment 5 of the liver is too small to characterize, but is unchanged compared to prior studies, favored to represent a tiny cyst. No other aggressive appearing cystic or solid hepatic lesions are noted. No intra or extrahepatic biliary ductal dilatation. Gallbladder is normal in appearance.  Pancreas: Unremarkable.  Spleen: Unremarkable.  Adrenals/Urinary Tract: Bilateral adrenal glands and bilateral kidneys are normal in appearance. No hydroureteronephrosis. Urinary bladder is normal in appearance.  Stomach/Bowel: Previously placed pigtail drainage catheter is in a collection in the left lower quadrant of the abdomen that still contains some gas and fluid, measuring approximately 4.9 x 2.8 x 3.6 cm (images 70 of series 2, coronal image 34, and sagittal image 82), significantly smaller  than the prior examination. This is intimately associated with the superior aspect of the proximal sigmoid colon, and several adjacent mid small bowel loops (presumably jejunal). One of the adjacent small bowel loops appears mildly thickened, best appreciated on image 69 of series 2. Additionally, there is extensive thickening of the colon in the region of the sigmoid colon, most severe in the proximal sigmoid colon, where there is also surrounding inflammatory changes in the adjacent sigmoid mesocolon, and hypervascularity in the sigmoid mesocolon. Dilatation of the more proximal aspects of the proximal to mid small bowel, with bowel loops measuring up to 4.4 cm in diameter, containing multiple air-fluid levels. Distal small bowel is relatively decompressed, but not completely decompressed, and there is gas, fluid and stool throughout the colon. The appearance of the stomach and appendix are normal. Terminal ileum is unremarkable in appearance.   Vascular/Lymphatic: Mild atherosclerosis in the abdominal and pelvic vasculature, without evidence of aneurysm, dissection or major branch occlusion. Numerous reactive size lymph nodes are noted in the sigmoid mesocolon. No definite pathologically enlarged lymph nodes are noted in the abdomen or pelvis.  Reproductive: Prostate gland and seminal vesicles are unremarkable in appearance.  Other: Trace volume of ascites.  No pneumoperitoneum.  Musculoskeletal: There are no aggressive appearing lytic or blastic lesions noted in the visualized portions of the skeleton.  IMPRESSION: 1. Interval placement of a pigtail drainage catheter within a peritoneal abscess in the left lower quadrant of the abdomen which is intimately associated with adjacent proximal sigmoid colon and adjacent jejunal small bowel loops. While there is some dilatation of the proximal to mid small bowel, with multiple air-fluid levels, this is favored to reflect a reactive ileus rather than a bowel obstruction based on findings on today's examination. The size of the abscess has significantly decreased following placement of the drainage catheter, although there is still residual fluid and gas within the collection. No new abscesses are identified on today's examination. Trace volume of ascites, presumably reactive. 2. Extensive thickening of the sigmoid colon wall, and jejunum, presumably related to areas of activity in this patient with history of Crohn's disease. 3. No pneumoperitoneum. 4. Small left and trace right pleural effusions layering dependently. 5. Additional incidental findings, as above.   Electronically Signed   By: Vinnie Langton M.D.   On: 11/03/2014 17:49   Ct Abdomen Pelvis W Contrast  10/26/2014   CLINICAL DATA:  Generalized abdominal pain. Nausea and vomiting. Fever. C difficile colitis.  EXAM: CT ABDOMEN AND PELVIS WITH CONTRAST  TECHNIQUE: Multidetector CT imaging of the abdomen and pelvis was performed using the standard  protocol following bolus administration of intravenous contrast.  CONTRAST:  139mL OMNIPAQUE IOHEXOL 300 MG/ML  SOLN  COMPARISON:  07/11/2014  FINDINGS: Lower Chest:  Unremarkable.  Hepatobiliary: Mild hepatic steatosis. No liver masses are identified. Gallbladder is unremarkable. Tiny sub-cm cyst again seen in the inferior right hepatic lobe.  Pancreas: No mass, inflammatory changes, or other significant abnormality identified.  Spleen:  Within normal limits in size and appearance.  Adrenals:  No masses identified.  Kidneys/Urinary Tract:  No evidence of masses or hydronephrosis.  Stomach/Bowel/Peritoneum: Mild diffuse colonic wall thickening and mucosal enhancement is again demonstrated, consistent with diffuse colitis. However, there is a more cysts severe area of colonic wall thickening and pericolonic inflammatory change involving the proximal sigmoid colon with a large extraluminal collection containing gas stool and fluid which measures approximately 6.3 x 8.4 cm. This is consistent with a pericolonic abscess. There is no  evidence of free fluid or bowel obstruction. No evidence of pneumatosis or portal venous gas.  Vascular/Lymphatic: No pathologically enlarged lymph nodes identified. No other significant abnormality visualized.  Reproductive:  No mass or other significant abnormality identified.  Other:  None.  Musculoskeletal:  No suspicious bone lesions identified.  IMPRESSION: Severe colitis or diverticulitis involving the proximal sigmoid colon, with large pericolonic abscess containing stool measuring approximately 6 x 8 cm. Background  Mild diffuse colitis also noted, without other areas of colonic perforation or abscess.  No evidence of free intraperitoneal air.  These results will be called to the ordering clinician or representative by the Radiologist Assistant, and communication documented in the PACS or zVision Dashboard.   Electronically Signed   By: Earle Gell M.D.   On: 10/26/2014 12:09   Ct  Image Guided Drainage By Percutaneous Catheter  10/28/2014   CLINICAL DATA:  44 year old male with Crohn's disease and colonic perforation with large intraperitoneal abscess. CT-guided drain placement is warranted.  EXAM: CT IMAGE GUIDED DRAINAGE BY PERCUTANEOUS CATHETER  Date: 10/28/2014  PROCEDURE: 1. Placement of 104 French drainage catheter under CT guidance Interventional Radiologist:  Criselda Peaches, MD  ANESTHESIA/SEDATION: Moderate (conscious) sedation was used. 2 mg Versed, 1 mcg Fentanyl were administered intravenously. The patient's vital signs were monitored continuously by radiology nursing throughout the procedure.  Sedation Time: 12 minutes  MEDICATIONS: None additional  TECHNIQUE: Informed consent was obtained from the patient following explanation of the procedure, risks, benefits and alternatives. The patient understands, agrees and consents for the procedure. All questions were addressed. A time out was performed.  A planning axial CT scan was performed. The colonic perforation was localized. A suitable skin entry site was selected and marked. The region was then sterilely prepped and draped in standard fashion with Betadine skin prep. Local anesthesia was attained by infiltration with 1% lidocaine. Using intermittent CT fluoroscopic guidance, an 18 gauge trocar needle was advanced into the fluid collection. A 0.035 inch wire was then coiled within the fluid collection in the tract dilated to 14 [place]. A Cook 16 French drainage catheter was then advanced over the wire and formed within the fluid collection.  Aspiration yielded approximately 50 mL of frankly feculent material. A sample was sent for culture. The catheter was then flushed and secured to the skin with 0 Prolene suture. The catheter was connected to JP bulb suction. Post drainage CT imaging demonstrates near-total aspiration of the abscess cavity. There is no evidence of complication.  COMPLICATIONS: None  IMPRESSION: 1.  Successful placement of a 14 French drainage catheter into the pericolonic fluid and gas collection. Aspiration the yields 50 mL frankly feculent material.  PLAN: 1. Maintain tube to JP bulb drainage until leukocytosis has normalized and clinical picture has improved. After that, recommend conversion to gravity bag drainage to minimize the risk for fistula formation. 2. Given the feculent nature of the fluid and the underlying Crohn's disease, patient is at very high likelihood of developing a fistulous connection between the catheter and bowel. Drainage catheter will likely be in place for an extended period. Recommend contrast injection under fluoroscopy prior to drain removal. 3. Patient to be followed in IR drain clinic at 2 weeks post discharge. Signed,  Criselda Peaches, MD  [facility]  [facility]   Electronically Signed   By: [name] M.D.   On: 10/28/2014 08:47    Oren Binet, MD  [facility] Pager:[phone]  If 7PM-7AM, please contact  night-coverage [email or web address] Password [email or web address] 11/04/2014, 11:12 AM   LOS: 9 days

## 2014-11-05 LAB — CBC
HCT: 29.6 % — ABNORMAL LOW (ref 39.0–52.0)
Hemoglobin: 9.2 g/dL — ABNORMAL LOW (ref 13.0–17.0)
MCH: 28.7 pg (ref 26.0–34.0)
MCHC: 31.1 g/dL (ref 30.0–36.0)
MCV: 92.2 fL (ref 78.0–100.0)
Platelets: 511 10*3/uL — ABNORMAL HIGH (ref 150–400)
RBC: 3.21 MIL/uL — ABNORMAL LOW (ref 4.22–5.81)
RDW: 17.9 % — ABNORMAL HIGH (ref 11.5–15.5)
WBC: 10.5 10*3/uL (ref 4.0–10.5)

## 2014-11-05 MED ORDER — PANTOPRAZOLE SODIUM 40 MG PO TBEC
40.0000 mg | DELAYED_RELEASE_TABLET | Freq: Every day | ORAL | Status: DC
  Administered 2014-11-05 – 2014-11-08 (×4): 40 mg via ORAL
  Filled 2014-11-05: qty 1

## 2014-11-05 MED ORDER — TRACE MINERALS CR-CU-F-FE-I-MN-MO-SE-ZN IV SOLN
INTRAVENOUS | Status: AC
  Administered 2014-11-05: 17:00:00 via INTRAVENOUS
  Filled 2014-11-05: qty 960

## 2014-11-05 MED ORDER — FAT EMULSION 20 % IV EMUL
120.0000 mL | INTRAVENOUS | Status: AC
  Administered 2014-11-05: 120 mL via INTRAVENOUS
  Filled 2014-11-05: qty 200

## 2014-11-05 NOTE — Patient Instructions (Addendum)
ANKLE CIRCLES   Move feet in a circle clockwise, then counterclockwise. Repeat for 10 times each way, 2 times each day.  KNEE PRESSES  Tighten top of left thigh. Hold for 3 seconds. Relax for 3 seconds. Repeat 10 times. Do 2 times a day. Repeat with other leg.            BUTTOCK PRESSES   Tighten bottom. Hold for 3 seconds. Relax for 3 seconds. Repeat 10 times. Do 2 times a day.      HEEL SLIDES   Slide left heel along bed towards bottom. Hold for 3 seconds. Slide back to flat knee position. Repeat 10 times. Do 2 times a day. Repeat with other leg.        LEG OUT TO THE SIDE    Lie on back, legs straight. Draw left leg out to the side as far as possible. Repeat 10 times, 2 times each day.  Repeat with other leg.  KNEE: Extension, Long Arc Quads - Sitting   Raise leg until knee is straight. Repeat 10 times, 2 times each day. Repeat with other leg.     Copyright  VHI. All rights reserved.

## 2014-11-05 NOTE — Progress Notes (Signed)
[facility] Progress Note     Subjective: Pt looks great today since PCA d/c'ed.  No N/V, tolerating fulls well.  Still not ambulating much.  Drain still putting out feculent drainage, but only 57mL/24hr.  Wife helping to drain it.  Still has pain/cramping with BM's.  Had a large BM this am.     Objective: Vital signs in last 24 hours: Temp:  [98.2 F (36.8 C)-99.6 F (37.6 C)] 98.2 F (36.8 C) (03/27 0530) Pulse Rate:  [104-110] 110 (03/27 0530) Resp:  [14-16] 15 (03/27 0530) BP: (110-130)/(74-91) 127/89 mmHg (03/27 0530) SpO2:  [98 %-100 %] 98 % (03/27 0530) Weight:  [70.534 kg (155 lb 8 oz)] 70.534 kg (155 lb 8 oz) (03/27 0528) Last BM Date: 11/04/14  Intake/Output from previous day: 03/26 0701 - 03/27 0700 In: 1725 [P.O.:570; I.V.:250; IV Piggyback:400; TPN:500] Out: 1310 [Urine:1275; Drains:35] Intake/Output this shift:    PE: Gen:  Alert, NAD, pleasant Card:  RRR, no M/G/R heard Pulm:  CTA, no W/R/R Abd: Soft, NT, minimal distension, +BS, no HSM, drain in LLQ with feculent brown thick drainage. Ext:  No erythema, edema, or tenderness    Lab Results:   Recent Labs  11/04/14 0545 11/05/14 0455  WBC 12.8* 10.5  HGB 9.3* 9.2*  HCT 30.3* 29.6*  PLT 569* 511*   BMET  Recent Labs  11/03/14 0516 11/04/14 0545  NA 134* 133*  K 3.7 4.4  CL 104 101  CO2 25 26  GLUCOSE 119* 142*  BUN <5* 7  CREATININE 0.51 0.59  CALCIUM 7.1* 8.2*   PT/INR No results for input(s): LABPROT, INR in the last 72 hours. CMP     Component Value Date/Time   NA 133* 11/04/2014 0545   K 4.4 11/04/2014 0545   CL 101 11/04/2014 0545   CO2 26 11/04/2014 0545   GLUCOSE 142* 11/04/2014 0545   BUN 7 11/04/2014 0545   CREATININE 0.59 11/04/2014 0545   CALCIUM 8.2* 11/04/2014 0545   PROT 4.7* 11/02/2014 0549   ALBUMIN 1.6* 11/02/2014 0549   AST 17 11/02/2014 0549   ALT 9 11/02/2014 0549   ALKPHOS 57 11/02/2014 0549   BILITOT 0.4 11/02/2014 0549   GFRNONAA >90  11/04/2014 0545   GFRAA >90 11/04/2014 0545   Lipase  No results found for: LIPASE     Studies/Results: Ct Abdomen Pelvis W Contrast  11/03/2014   CLINICAL DATA:  44 year old male with continued abdominal pain and low-grade fevers after drain placement. History of Crohn's disease.  EXAM: CT ABDOMEN AND PELVIS WITH CONTRAST  TECHNIQUE: Multidetector CT imaging of the abdomen and pelvis was performed using the standard protocol following bolus administration of intravenous contrast.  CONTRAST:  164mL OMNIPAQUE IOHEXOL 300 MG/ML  SOLN  COMPARISON:  CT the abdomen and pelvis 10/27/2014.  FINDINGS: Lower chest: Small left and trace right-sided pleural effusions lying dependently. Dependent subsegmental atelectasis in the lower lobes of the lungs bilaterally. Central venous catheter tip terminating at the superior cavoatrial junction.  Hepatobiliary: Sub cm low-attenuation lesion in segment 5 of the liver is too small to characterize, but is unchanged compared to prior studies, favored to represent a tiny cyst. No other aggressive appearing cystic or solid hepatic lesions are noted. No intra or extrahepatic biliary ductal dilatation. Gallbladder is normal in appearance.  Pancreas: Unremarkable.  Spleen: Unremarkable.  Adrenals/Urinary Tract: Bilateral adrenal glands and bilateral kidneys are normal in appearance. No hydroureteronephrosis. Urinary bladder is normal in appearance.  Stomach/Bowel: Previously placed pigtail  drainage catheter is in a collection in the left lower quadrant of the abdomen that still contains some gas and fluid, measuring approximately 4.9 x 2.8 x 3.6 cm (images 70 of series 2, coronal image 34, and sagittal image 82), significantly smaller than the prior examination. This is intimately associated with the superior aspect of the proximal sigmoid colon, and several adjacent mid small bowel loops (presumably jejunal). One of the adjacent small bowel loops appears mildly thickened, best  appreciated on image 69 of series 2. Additionally, there is extensive thickening of the colon in the region of the sigmoid colon, most severe in the proximal sigmoid colon, where there is also surrounding inflammatory changes in the adjacent sigmoid mesocolon, and hypervascularity in the sigmoid mesocolon. Dilatation of the more proximal aspects of the proximal to mid small bowel, with bowel loops measuring up to 4.4 cm in diameter, containing multiple air-fluid levels. Distal small bowel is relatively decompressed, but not completely decompressed, and there is gas, fluid and stool throughout the colon. The appearance of the stomach and appendix are normal. Terminal ileum is unremarkable in appearance.  Vascular/Lymphatic: Mild atherosclerosis in the abdominal and pelvic vasculature, without evidence of aneurysm, dissection or major branch occlusion. Numerous reactive size lymph nodes are noted in the sigmoid mesocolon. No definite pathologically enlarged lymph nodes are noted in the abdomen or pelvis.  Reproductive: Prostate gland and seminal vesicles are unremarkable in appearance.  Other: Trace volume of ascites.  No pneumoperitoneum.  Musculoskeletal: There are no aggressive appearing lytic or blastic lesions noted in the visualized portions of the skeleton.  IMPRESSION: 1. Interval placement of a pigtail drainage catheter within a peritoneal abscess in the left lower quadrant of the abdomen which is intimately associated with adjacent proximal sigmoid colon and adjacent jejunal small bowel loops. While there is some dilatation of the proximal to mid small bowel, with multiple air-fluid levels, this is favored to reflect a reactive ileus rather than a bowel obstruction based on findings on today's examination. The size of the abscess has significantly decreased following placement of the drainage catheter, although there is still residual fluid and gas within the collection. No new abscesses are identified on  today's examination. Trace volume of ascites, presumably reactive. 2. Extensive thickening of the sigmoid colon wall, and jejunum, presumably related to areas of activity in this patient with history of Crohn's disease. 3. No pneumoperitoneum. 4. Small left and trace right pleural effusions layering dependently. 5. Additional incidental findings, as above.   Electronically Signed   By: Vinnie Langton M.D.   On: 11/03/2014 17:49    Anti-infectives: Anti-infectives    Start     Dose/Rate Route Frequency Ordered Stop   10/31/14 2000  cefTRIAXone (ROCEPHIN) 1 g in dextrose 5 % 50 mL IVPB - Premix     1 g 100 mL/hr over 30 Minutes Intravenous Every 24 hours 10/31/14 1855     10/31/14 2000  metroNIDAZOLE (FLAGYL) IVPB 500 mg     500 mg 100 mL/hr over 60 Minutes Intravenous Every 8 hours 10/31/14 1855     10/28/14 1200  piperacillin-tazobactam (ZOSYN) IVPB 3.375 g  Status:  Discontinued     3.375 g 12.5 mL/hr over 240 Minutes Intravenous Every 8 hours 10/28/14 1021 10/31/14 1855   10/26/14 2200  metroNIDAZOLE (FLAGYL) IVPB 500 mg  Status:  Discontinued     500 mg 100 mL/hr over 60 Minutes Intravenous Every 8 hours 10/26/14 2015 10/28/14 0943   10/26/14 2100  ciprofloxacin (CIPRO)  IVPB 400 mg  Status:  Discontinued     400 mg 200 mL/hr over 60 Minutes Intravenous Every 12 hours 10/26/14 2027 10/28/14 1021   10/26/14 1900  fluconazole (DIFLUCAN) IVPB 100 mg  Status:  Discontinued     100 mg 50 mL/hr over 60 Minutes Intravenous Every 24 hours 10/26/14 1805 11/03/14 1251   10/26/14 1800  vancomycin (VANCOCIN) 50 mg/mL oral solution 125 mg     125 mg Oral 4 times per day 10/26/14 1655         Assessment/Plan HD #10 Intra-abdominal abscess with crohn's disease -On Rocephin and Flagyl, along with oral vancomycin -IR placed a perc drain 10/27/14. 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 Continue to monitor with conservative management.  -Would try to avoid surgery in this patient right now as much as possible unless he deteriorated -Pain well controlled with oral Oxy IR, takes 30mg  BID MS Contin home dose -Needs to ambulate!!! - Ordered PT Hypoalbuminemia/low prealbumin - TPN -Albumin is 1.6. Patient states he was eating great prior to admission; however, this is very low for a 44 yo male. Prealbumin is 4.3. Recheck labs pending Monday, TPN given malnourished state currently.  Now on fulls, would continue this to make sure output doesn't pick up, then maybe soft diet tomorrow.  Will likely be discharged home soon. Recurrent C.diff - stool still positive 10/29/14 -Oral Vanc, ID following    LOS: 10 days    DORT, Alexande Sheerin 11/05/2014, 8:05 AM Pager: [phone]

## 2014-11-05 NOTE — Progress Notes (Signed)
PARENTERAL NUTRITION CONSULT NOTE - FOLLOW UP  Pharmacy Consult:  TPN Indication:  Sigmoid abscess with high likelihood of fistula formation  No Known Allergies  Patient Measurements: Height: 5\' 7"  (170.2 cm) Weight: 155 lb 8 oz (70.534 kg) IBW/kg (Calculated) : 66.1  Usual weight = 96 kg Current weight = 76 kg  Vital Signs: Temp: 98.2 F (36.8 C) (03/27 0530) Temp Source: Oral (03/27 0530) BP: 127/89 mmHg (03/27 0530) Pulse Rate: 110 (03/27 0530) Intake/Output from previous day: 03/26 0701 - 03/27 0700 In: 1725 [P.O.:570; I.V.:250; IV Piggyback:400; TPN:500] Out: 1310 [Urine:1275; Drains:35]  Labs:  Recent Labs  11/03/14 0516 11/04/14 0545 11/05/14 0455  WBC 8.9 12.8* 10.5  HGB 7.0* 9.3* 9.2*  HCT 22.7* 30.3* 29.6*  PLT 543* 569* 511*     Recent Labs  11/03/14 0516 11/04/14 0545  NA 134* 133*  K 3.7 4.4  CL 104 101  CO2 25 26  GLUCOSE 119* 142*  BUN <5* 7  CREATININE 0.51 0.59  CALCIUM 7.1* 8.2*   Estimated Creatinine Clearance: 112.5 mL/min (by C-G formula based on Cr of 0.59).    Recent Labs  11/03/14 1738 11/04/14 0009 11/04/14 0519  GLUCAP 113* 139* 153*     Insulin Requirements in the past 24 hours:  SSI/CBG checks d/c'ed 3/26  Assessment: 33 YOM with history of Crohn's and recent C.diff colitis presented on 10/26/14 with worsening abdominal pain.  CT showed sigmoid abscess and is s/p drain placement on 10/27/14 by IR.  Medical team suspects fistulous connection from colon to abscess cavity.  Given likelihood of surgery, high suspicion of fistula and poor nutritional status (baseline prealbumin at 4.3 and significant weight loss), Pharmacy consulted to manage TPN.  GI: Crohn's on Humira, Imuran, prednisone PTA.  Intra-abd abscess drain yielded feculent material (resolved) >> improving and may not need surgery, advancing diet.   Baseline prealbumin 4.3.  Repeat CT on 3/25 showed reactive ileus instead of bowel obstruction, no abscess.  Still  with feculent drainage but has improved, continue full liquid to make sure it does not increase, may advance to soft diet in AM.  +BM Endo: no hx DM - CBGs acceptable with minimal SSI use so SSI d/c'ed 3/26 Lytes: mild hyponatremia, others WNL - watch Renal: SCr stable, CrCL 113 ml/min - good UOP 0.8 ml/kg/hr, NS30K at 20 ml/hr Cards: no hx - BP controlled, tachy Hepatobil: LFTs / tbili / TG WNL Neuro: hx anxiety and ankylosing spondylitis - MS Contin, receiving PRN Ativan + OxyIR + Zofran, pain score 0-7 ID: Diflucan and Zosyn changed to CTX/Flagyl for intra-abd abscess + PO Vanc for C.diff - Tmax 100.7, WBC increased to 12.8 Best Practices: heparin SQ, MC TPN Access: PICC 10/31/14 TPN day#: 5 (3/22 >> )  Current Nutrition:  TPN + full liquid diet Anticipated goal: Clinimix E 5/15 at 100 ml/hr + IVFE at 7 ml/hr = 2040 kCal and 120gm of protein per day  Nutritional Goals:  1900-2100 kCal, 100-120 grams of protein per day   Plan:  - Continue Clinimix E 5/15 at 40 ml/hr + IVFE at 5 ml/hr (rate reduced 3/26 per PA when diet advanced to fulls) - Daily multivitamin and trace elements - F/U diet advancement and possibility of discontinuing TPN in AM - F/U AM labs    Khaleb Broz D. Mina Marble, PharmD, BCPS Pager:  [phone] 11/05/2014, 10:53 AM

## 2014-11-05 NOTE — Progress Notes (Signed)
PATIENT DETAILS Name: Vincent Black Age: 44 y.o. Sex: male Date of Birth: [birth year] Admit Date: 10/26/2014 Admitting Physician Reyne Dumas, MD PCP:No PCP Per Patient  Brief narrative:  44 year old male with history of Crohn's disease, recently discharged from this hospital on 10/10/14 after being treated for C. difficile colitis. Patient presented to the hospital on 3/17 with abdominal pain. CT of the abdomen showed sigmoid abscess. Patient was then admitted for further evaluation and treatment. Patient was started on empiric antibiotics, kept nothing by mouth, gastrology, surgery interventional radiology was consulted. She underwent IR drain placement on 3/18. Infectious disease directed antibiotic regimen. Has been started on TNA. Repeat CT of the abdomen on 3/25 showed significant improvement  Subjective: No major complaints this am.Ambulated  Assessment/Plan: Principal Problem:   Sepsis:sepsis pathophysiology has resolved. Secondary to intra-abdominal abscess. See below for Abx and cultures  Active Problems:   Pericolonic abscess: underwent pec drain placement on 3/18, cultures positive for Citrobacter and Klebsiella. ID consulted and directing Abx. Currently on Rocephin. IR, GI and CCS following. Repeat CT Abd on 3/25 shows significant improvement.Pig tail drain in place. Gen Surgery has started patient on full liquids -which patient is tolerating well. Continues to be on TNA as well. As clinically improves, hopefully will be able to wean off TNA and advance diet.   C Diff Colitis:On empiric oral Vanco and IV Flagyl    Crohn's disease: complicated by development of possible fistulous connection between abscess and colon. GI/CCS following. Abx as above. Currently not on any anti-inflammatories/immunosuppressives-which remain on hold.     Protein-calorie malnutrition, severe: Continue with TNA/oral supplements    Hyponatremia: mild, monitor for now    Chronic  Odynophagia:completed a course of fluconazole.    Chronic Pain Syndrome:MS Continue resumed, follow and adjust dosing.    Anemia: secondary to acute on chronic illness, transfused PRBC on 3/25. Follow CBC  Disposition: Remain inpatient  Antibiotics:  See below   Anti-infectives    Start     Dose/Rate Route Frequency Ordered Stop   10/31/14 2000  cefTRIAXone (ROCEPHIN) 1 g in dextrose 5 % 50 mL IVPB - Premix     1 g 100 mL/hr over 30 Minutes Intravenous Every 24 hours 10/31/14 1855     10/31/14 2000  metroNIDAZOLE (FLAGYL) IVPB 500 mg     500 mg 100 mL/hr over 60 Minutes Intravenous Every 8 hours 10/31/14 1855     10/28/14 1200  piperacillin-tazobactam (ZOSYN) IVPB 3.375 g  Status:  Discontinued     3.375 g 12.5 mL/hr over 240 Minutes Intravenous Every 8 hours 10/28/14 1021 10/31/14 1855   10/26/14 2200  metroNIDAZOLE (FLAGYL) IVPB 500 mg  Status:  Discontinued     500 mg 100 mL/hr over 60 Minutes Intravenous Every 8 hours 10/26/14 2015 10/28/14 0943   10/26/14 2100  ciprofloxacin (CIPRO) IVPB 400 mg  Status:  Discontinued     400 mg 200 mL/hr over 60 Minutes Intravenous Every 12 hours 10/26/14 2027 10/28/14 1021   10/26/14 1900  fluconazole (DIFLUCAN) IVPB 100 mg  Status:  Discontinued     100 mg 50 mL/hr over 60 Minutes Intravenous Every 24 hours 10/26/14 1805 11/03/14 1251   10/26/14 1800  vancomycin (VANCOCIN) 50 mg/mL oral solution 125 mg     125 mg Oral 4 times per day 10/26/14 1655        DVT Prophylaxis: Prophylactic Heparin   Code Status: Full code   Family Communication  Spouse at bedside  Procedures:  Perc Drain 3/18  CONSULTS:  ID, GI, general surgery and IR   MEDICATIONS: Scheduled Meds: . sodium chloride   Intravenous Once  . cefTRIAXone (ROCEPHIN)  IV  1 g Intravenous Q24H  . feeding supplement (RESOURCE BREEZE)  1 Container Oral TID BM  . heparin  5,000 Units Subcutaneous 3 times per day  . magic mouthwash  10 mL Oral QID  . metronidazole   500 mg Intravenous Q8H  . morphine  30 mg Oral Q12H  . pantoprazole  40 mg Oral Daily  . vancomycin  125 mg Oral 4 times per day   Continuous Infusions: . Marland KitchenTPN (CLINIMIX-E) Adult 40 mL/hr at 11/05/14 0600   And  . fat emulsion 120 mL (11/05/14 0600)  . Marland KitchenTPN (CLINIMIX-E) Adult     And  . fat emulsion    . 0.9 % sodium chloride with kcl 20 mL/hr at 11/04/14 2301   PRN Meds:.acetaminophen, LORazepam, methocarbamol, ondansetron (ZOFRAN) IV, oxyCODONE, sodium chloride    PHYSICAL EXAM: Vital signs in last 24 hours: Filed Vitals:   11/04/14 1830 11/04/14 2245 11/05/14 0528 11/05/14 0530  BP: 130/91 127/76  127/89  Pulse: 104 110  110  Temp: 98.2 F (36.8 C) 99.6 F (37.6 C)  98.2 F (36.8 C)  TempSrc: Oral Oral  Oral  Resp: 16 15  15   Height:      Weight:   70.534 kg (155 lb 8 oz)   SpO2: 99% 99%  98%    Weight change: 1.534 kg (3 lb 6.1 oz) Filed Weights   11/01/14 0447 11/04/14 0500 11/05/14 0528  Weight: 73.8 kg (162 lb 11.2 oz) 69 kg (152 lb 1.9 oz) 70.534 kg (155 lb 8 oz)   Body mass index is 24.35 kg/(m^2).   Gen Exam: Awake and alert with clear speech.   Neck: Supple, No JVD.   Chest: B/L Clear.   CVS: S1 S2 Regular, no murmurs.  Abdomen: soft, BS +, non tender, non distended.  Extremities: no edema, lower extremities warm to touch. Neurologic: Non Focal.   Skin: No Rash.   Wounds: N/A.    Intake/Output from previous day:  Intake/Output Summary (Last 24 hours) at 11/05/14 1257 Last data filed at 11/05/14 0700  Gross per 24 hour  Intake   1740 ml  Output    560 ml  Net   1180 ml     LAB RESULTS: CBC  Recent Labs Lab 11/01/14 0534 11/02/14 0549 11/03/14 0516 11/04/14 0545 11/05/14 0455  WBC 9.4 10.0 8.9 12.8* 10.5  HGB 7.2* 7.4* 7.0* 9.3* 9.2*  HCT 22.5* 23.8* 22.7* 30.3* 29.6*  PLT 674* 648* 543* 569* 511*  MCV 93.4 94.1 94.6 92.4 92.2  MCH 29.9 29.2 29.2 28.4 28.7  MCHC 32.0 31.1 30.8 30.7 31.1  RDW 16.4* 17.0* 17.1* 17.9* 17.9*    LYMPHSABS 2.4  --   --   --   --   MONOABS 0.7  --   --   --   --   EOSABS 0.2  --   --   --   --   BASOSABS 0.0  --   --   --   --     Chemistries   Recent Labs Lab 10/31/14 0640 11/01/14 0534 11/02/14 0549 11/03/14 0516 11/04/14 0545  NA 131* 134* 134* 134* 133*  K 4.0 3.1* 3.9 3.7 4.4  CL 94* 99 103 104 101  CO2 30 32 26 25 26  GLUCOSE 90 121* 133* 119* 142*  BUN <5* <5* <5* <5* 7  CREATININE 0.80 0.62 0.54 0.51 0.59  CALCIUM 7.3* 7.3* 7.3* 7.1* 8.2*  MG 1.9 1.8 1.9  --   --     CBG:  Recent Labs Lab 11/03/14 0608 11/03/14 1155 11/03/14 1738 11/04/14 0009 11/04/14 0519  GLUCAP 140* 135* 113* 139* 153*    GFR Estimated Creatinine Clearance: 112.5 mL/min (by C-G formula based on Cr of 0.59).  Coagulation profile No results for input(s): INR, PROTIME in the last 168 hours.  Cardiac Enzymes No results for input(s): CKMB, TROPONINI, MYOGLOBIN in the last 168 hours.  Invalid input(s): CK  Invalid input(s): POCBNP No results for input(s): DDIMER in the last 72 hours. No results for input(s): HGBA1C in the last 72 hours. No results for input(s): CHOL, HDL, LDLCALC, TRIG, CHOLHDL, LDLDIRECT in the last 72 hours. No results for input(s): TSH, T4TOTAL, T3FREE, THYROIDAB in the last 72 hours.  Invalid input(s): FREET3 No results for input(s): VITAMINB12, FOLATE, FERRITIN, TIBC, IRON, RETICCTPCT in the last 72 hours. No results for input(s): LIPASE, AMYLASE in the last 72 hours.  Urine Studies No results for input(s): UHGB, CRYS in the last 72 hours.  Invalid input(s): UACOL, UAPR, USPG, UPH, UTP, UGL, UKET, UBIL, UNIT, UROB, ULEU, UEPI, UWBC, URBC, UBAC, CAST, UCOM, BILUA  MICROBIOLOGY: Recent Results (from the past 240 hour(s))  Culture, blood (routine x 2)     Status: None   Collection Time: 10/26/14 10:13 PM  Result Value Ref Range Status   Specimen Description BLOOD LEFT ARM  Final   Special Requests BOTTLES DRAWN AEROBIC AND ANAEROBIC 5CC  Final    Culture   Final    NO GROWTH 5 DAYS Performed at [facility]    Report Status 11/02/2014 FINAL  Final  Culture, blood (routine x 2)     Status: None   Collection Time: 10/26/14 10:15 PM  Result Value Ref Range Status   Specimen Description BLOOD LEFT HAND  Final   Special Requests   Final    BOTTLES DRAWN AEROBIC AND ANAEROBIC [place] BLUE 3CC PURPLE   Culture   Final    NO GROWTH 5 DAYS Performed at [facility]    Report Status 11/02/2014 FINAL  Final  Anaerobic culture     Status: None   Collection Time: 10/27/14  5:56 PM  Result Value Ref Range Status   Specimen Description ABSCESS ABDOMEN  Final   Special Requests NONE  Final   Gram Stain   Final    MODERATE WBC PRESENT,BOTH PMN AND MONONUCLEAR NO SQUAMOUS EPITHELIAL CELLS SEEN MODERATE GRAM POSITIVE COCCI IN PAIRS IN CHAINS IN CLUSTERS FEW GRAM NEGATIVE RODS Performed at [facility]    Culture   Final    NO ANAEROBES ISOLATED Performed at [facility]    Report Status 11/01/2014 FINAL  Final  Culture, routine-abscess     Status: None   Collection Time: 10/27/14  5:56 PM  Result Value Ref Range Status   Specimen Description ABSCESS ABDOMEN  Final   Special Requests NONE  Final   Gram Stain   Final    MODERATE WBC PRESENT,BOTH PMN AND MONONUCLEAR NO SQUAMOUS EPITHELIAL CELLS SEEN MODERATE GRAM POSITIVE COCCI IN PAIRS IN CHAINS IN CLUSTERS FEW GRAM NEGATIVE RODS Performed at [facility]    Culture   Final    [place] Performed at [facility]  Report Status 10/31/2014 FINAL  Final   Organism ID, Bacteria CITROBACTER FREUNDII  Final   Organism ID, Bacteria KLEBSIELLA OXYTOCA  Final      Susceptibility   Citrobacter freundii - MIC*    CEFAZOLIN >=64 RESISTANT Resistant     CEFEPIME <=1 SENSITIVE Sensitive     CEFTAZIDIME <=1 SENSITIVE Sensitive     CEFTRIAXONE <=1 SENSITIVE Sensitive     CIPROFLOXACIN <=0.25  SENSITIVE Sensitive     GENTAMICIN <=1 SENSITIVE Sensitive     IMIPENEM <=0.25 SENSITIVE Sensitive     PIP/TAZO <=4 SENSITIVE Sensitive     TOBRAMYCIN <=1 SENSITIVE Sensitive     TRIMETH/SULFA <=20 SENSITIVE Sensitive     * ABUNDANT CITROBACTER FREUNDII   Klebsiella oxytoca - MIC*    AMPICILLIN RESISTANT      AMPICILLIN/SULBACTAM 4 SENSITIVE Sensitive     CEFAZOLIN <=4 SENSITIVE Sensitive     CEFEPIME <=1 SENSITIVE Sensitive     CEFTAZIDIME <=1 SENSITIVE Sensitive     CEFTRIAXONE <=1 SENSITIVE Sensitive     CIPROFLOXACIN <=0.25 SENSITIVE Sensitive     GENTAMICIN <=1 SENSITIVE Sensitive     IMIPENEM <=0.25 SENSITIVE Sensitive     PIP/TAZO <=4 SENSITIVE Sensitive     TOBRAMYCIN <=1 SENSITIVE Sensitive     TRIMETH/SULFA <=20 SENSITIVE Sensitive     * ABUNDANT KLEBSIELLA OXYTOCA  Clostridium Difficile by PCR     Status: Abnormal   Collection Time: 10/29/14  9:58 AM  Result Value Ref Range Status   C difficile by pcr POSITIVE (A) NEGATIVE Final    Comment: CRITICAL RESULT CALLED TO, READ BACK BY AND VERIFIED WITH: A.[facility] 10/30/14 @1420  BY V.WILKINS     RADIOLOGY STUDIES/RESULTS: Ct Abdomen Pelvis W Contrast  11/03/2014   CLINICAL DATA:  44 year old male with continued abdominal pain and low-grade fevers after drain placement. History of Crohn's disease.  EXAM: CT ABDOMEN AND PELVIS WITH CONTRAST  TECHNIQUE: Multidetector CT imaging of the abdomen and pelvis was performed using the standard protocol following bolus administration of intravenous contrast.  CONTRAST:  144mL OMNIPAQUE IOHEXOL 300 MG/ML  SOLN  COMPARISON:  CT the abdomen and pelvis 10/27/2014.  FINDINGS: Lower chest: Small left and trace right-sided pleural effusions lying dependently. Dependent subsegmental atelectasis in the lower lobes of the lungs bilaterally. Central venous catheter tip terminating at the superior cavoatrial junction.  Hepatobiliary: Sub cm low-attenuation lesion in segment 5 of the liver is too small  to characterize, but is unchanged compared to prior studies, favored to represent a tiny cyst. No other aggressive appearing cystic or solid hepatic lesions are noted. No intra or extrahepatic biliary ductal dilatation. Gallbladder is normal in appearance.  Pancreas: Unremarkable.  Spleen: Unremarkable.  Adrenals/Urinary Tract: Bilateral adrenal glands and bilateral kidneys are normal in appearance. No hydroureteronephrosis. Urinary bladder is normal in appearance.  Stomach/Bowel: Previously placed pigtail drainage catheter is in a collection in the left lower quadrant of the abdomen that still contains some gas and fluid, measuring approximately 4.9 x 2.8 x 3.6 cm (images 70 of series 2, coronal image 34, and sagittal image 82), significantly smaller than the prior examination. This is intimately associated with the superior aspect of the proximal sigmoid colon, and several adjacent mid small bowel loops (presumably jejunal). One of the adjacent small bowel loops appears mildly thickened, best appreciated on image 69 of series 2. Additionally, there is extensive thickening of the colon in the region of the sigmoid colon, most severe in the proximal sigmoid colon, where there  is also surrounding inflammatory changes in the adjacent sigmoid mesocolon, and hypervascularity in the sigmoid mesocolon. Dilatation of the more proximal aspects of the proximal to mid small bowel, with bowel loops measuring up to 4.4 cm in diameter, containing multiple air-fluid levels. Distal small bowel is relatively decompressed, but not completely decompressed, and there is gas, fluid and stool throughout the colon. The appearance of the stomach and appendix are normal. Terminal ileum is unremarkable in appearance.  Vascular/Lymphatic: Mild atherosclerosis in the abdominal and pelvic vasculature, without evidence of aneurysm, dissection or major branch occlusion. Numerous reactive size lymph nodes are noted in the sigmoid mesocolon. No  definite pathologically enlarged lymph nodes are noted in the abdomen or pelvis.  Reproductive: Prostate gland and seminal vesicles are unremarkable in appearance.  Other: Trace volume of ascites.  No pneumoperitoneum.  Musculoskeletal: There are no aggressive appearing lytic or blastic lesions noted in the visualized portions of the skeleton.  IMPRESSION: 1. Interval placement of a pigtail drainage catheter within a peritoneal abscess in the left lower quadrant of the abdomen which is intimately associated with adjacent proximal sigmoid colon and adjacent jejunal small bowel loops. While there is some dilatation of the proximal to mid small bowel, with multiple air-fluid levels, this is favored to reflect a reactive ileus rather than a bowel obstruction based on findings on today's examination. The size of the abscess has significantly decreased following placement of the drainage catheter, although there is still residual fluid and gas within the collection. No new abscesses are identified on today's examination. Trace volume of ascites, presumably reactive. 2. Extensive thickening of the sigmoid colon wall, and jejunum, presumably related to areas of activity in this patient with history of Crohn's disease. 3. No pneumoperitoneum. 4. Small left and trace right pleural effusions layering dependently. 5. Additional incidental findings, as above.   Electronically Signed   By: Vinnie Langton M.D.   On: 11/03/2014 17:49   Ct Abdomen Pelvis W Contrast  10/26/2014   CLINICAL DATA:  Generalized abdominal pain. Nausea and vomiting. Fever. C difficile colitis.  EXAM: CT ABDOMEN AND PELVIS WITH CONTRAST  TECHNIQUE: Multidetector CT imaging of the abdomen and pelvis was performed using the standard protocol following bolus administration of intravenous contrast.  CONTRAST:  147mL OMNIPAQUE IOHEXOL 300 MG/ML  SOLN  COMPARISON:  07/11/2014  FINDINGS: Lower Chest:  Unremarkable.  Hepatobiliary: Mild hepatic steatosis. No  liver masses are identified. Gallbladder is unremarkable. Tiny sub-cm cyst again seen in the inferior right hepatic lobe.  Pancreas: No mass, inflammatory changes, or other significant abnormality identified.  Spleen:  Within normal limits in size and appearance.  Adrenals:  No masses identified.  Kidneys/Urinary Tract:  No evidence of masses or hydronephrosis.  Stomach/Bowel/Peritoneum: Mild diffuse colonic wall thickening and mucosal enhancement is again demonstrated, consistent with diffuse colitis. However, there is a more cysts severe area of colonic wall thickening and pericolonic inflammatory change involving the proximal sigmoid colon with a large extraluminal collection containing gas stool and fluid which measures approximately 6.3 x 8.4 cm. This is consistent with a pericolonic abscess. There is no evidence of free fluid or bowel obstruction. No evidence of pneumatosis or portal venous gas.  Vascular/Lymphatic: No pathologically enlarged lymph nodes identified. No other significant abnormality visualized.  Reproductive:  No mass or other significant abnormality identified.  Other:  None.  Musculoskeletal:  No suspicious bone lesions identified.  IMPRESSION: Severe colitis or diverticulitis involving the proximal sigmoid colon, with large pericolonic abscess containing stool measuring  approximately 6 x 8 cm. Background  Mild diffuse colitis also noted, without other areas of colonic perforation or abscess.  No evidence of free intraperitoneal air.  These results will be called to the ordering clinician or representative by the Radiologist Assistant, and communication documented in the PACS or zVision Dashboard.   Electronically Signed   By: Earle Gell M.D.   On: 10/26/2014 12:09   Ct Image Guided Drainage By Percutaneous Catheter  10/28/2014   CLINICAL DATA:  44 year old male with Crohn's disease and colonic perforation with large intraperitoneal abscess. CT-guided drain placement is warranted.  EXAM:  CT IMAGE GUIDED DRAINAGE BY PERCUTANEOUS CATHETER  Date: 10/28/2014  PROCEDURE: 1. Placement of 47 French drainage catheter under CT guidance Interventional Radiologist:  Criselda Peaches, MD  ANESTHESIA/SEDATION: Moderate (conscious) sedation was used. 2 mg Versed, 1 mcg Fentanyl were administered intravenously. The patient's vital signs were monitored continuously by radiology nursing throughout the procedure.  Sedation Time: 12 minutes  MEDICATIONS: None additional  TECHNIQUE: Informed consent was obtained from the patient following explanation of the procedure, risks, benefits and alternatives. The patient understands, agrees and consents for the procedure. All questions were addressed. A time out was performed.  A planning axial CT scan was performed. The colonic perforation was localized. A suitable skin entry site was selected and marked. The region was then sterilely prepped and draped in standard fashion with Betadine skin prep. Local anesthesia was attained by infiltration with 1% lidocaine. Using intermittent CT fluoroscopic guidance, an 18 gauge trocar needle was advanced into the fluid collection. A 0.035 inch wire was then coiled within the fluid collection in the tract dilated to 14 [place]. A Cook 61 French drainage catheter was then advanced over the wire and formed within the fluid collection.  Aspiration yielded approximately 50 mL of frankly feculent material. A sample was sent for culture. The catheter was then flushed and secured to the skin with 0 Prolene suture. The catheter was connected to JP bulb suction. Post drainage CT imaging demonstrates near-total aspiration of the abscess cavity. There is no evidence of complication.  COMPLICATIONS: None  IMPRESSION: 1. Successful placement of a 14 French drainage catheter into the pericolonic fluid and gas collection. Aspiration the yields 50 mL frankly feculent material.  PLAN: 1. Maintain tube to JP bulb drainage until leukocytosis has  normalized and clinical picture has improved. After that, recommend conversion to gravity bag drainage to minimize the risk for fistula formation. 2. Given the feculent nature of the fluid and the underlying Crohn's disease, patient is at very high likelihood of developing a fistulous connection between the catheter and bowel. Drainage catheter will likely be in place for an extended period. Recommend contrast injection under fluoroscopy prior to drain removal. 3. Patient to be followed in IR drain clinic at 2 weeks post discharge. Signed,  Criselda Peaches, MD  [facility]  [facility]   Electronically Signed   By: [name] M.D.   On: 10/28/2014 08:47    Oren Binet, MD  [facility] Pager:[phone]  If 7PM-7AM, please contact night-coverage [email or web address] Password [facility] 11/05/2014, 12:57 PM   LOS: 10 days

## 2014-11-05 NOTE — Progress Notes (Signed)
Cross cover for Dr. Benson Norway. Subjective: Since I last evaluated the patient, he seems to be doing much better. He has had 1 BM, in the last 24 hours and looks much better than he did 2 days ago. He claims he feels a lot better as well. The abdominal pain has improved considerably but has not completly resolved. He has feculent drainage from the drain in the abscess. PCA has been discontinued. ,  Objective: Vital signs in last 24 hours: Temp:  [98.2 F (36.8 C)-99.6 F (37.6 C)] 98.2 F (36.8 C) (03/27 0530) Pulse Rate:  [104-110] 110 (03/27 0530) Resp:  [15-16] 15 (03/27 0530) BP: (120-130)/(76-91) 127/89 mmHg (03/27 0530) SpO2:  [98 %-99 %] 98 % (03/27 0530) Weight:  [70.534 kg (155 lb 8 oz)] 70.534 kg (155 lb 8 oz) (03/27 0528) Last BM Date: 11/04/14  Intake/Output from previous day: 03/26 0701 - 03/27 0700 In: 1965 [P.O.:570; I.V.:490; IV Piggyback:400; TPN:500] Out: 1310 [Urine:1275; Drains:35] Intake/Output this shift:  General appearance: alert, cooperative and no distress Resp: clear to auscultation bilaterally Cardio: regular rate and rhythm, S1, S2 normal, no murmur, click, rub or gallop GI: soft, non-tender; bowel sounds normal; no masses,  no organomegaly Extremities: extremities normal, atraumatic, no cyanosis or edema  Lab Results:  Recent Labs  11/03/14 0516 11/04/14 0545 11/05/14 0455  WBC 8.9 12.8* 10.5  HGB 7.0* 9.3* 9.2*  HCT 22.7* 30.3* 29.6*  PLT 543* 569* 511*   BMET  Recent Labs  11/03/14 0516 11/04/14 0545  NA 134* 133*  K 3.7 4.4  CL 104 101  CO2 25 26  GLUCOSE 119* 142*  BUN <5* 7  CREATININE 0.51 0.59  CALCIUM 7.1* 8.2*   Studies/Results: Ct Abdomen Pelvis W Contrast  11/03/2014   CLINICAL DATA:  44 year old male with continued abdominal pain and low-grade fevers after drain placement. History of Crohn's disease.  EXAM: CT ABDOMEN AND PELVIS WITH CONTRAST  TECHNIQUE: Multidetector CT imaging of the abdomen and pelvis was performed using  the standard protocol following bolus administration of intravenous contrast.  CONTRAST:  165mL OMNIPAQUE IOHEXOL 300 MG/ML  SOLN  COMPARISON:  CT the abdomen and pelvis 10/27/2014.  FINDINGS: Lower chest: Small left and trace right-sided pleural effusions lying dependently. Dependent subsegmental atelectasis in the lower lobes of the lungs bilaterally. Central venous catheter tip terminating at the superior cavoatrial junction.  Hepatobiliary: Sub cm low-attenuation lesion in segment 5 of the liver is too small to characterize, but is unchanged compared to prior studies, favored to represent a tiny cyst. No other aggressive appearing cystic or solid hepatic lesions are noted. No intra or extrahepatic biliary ductal dilatation. Gallbladder is normal in appearance.  Pancreas: Unremarkable.  Spleen: Unremarkable.  Adrenals/Urinary Tract: Bilateral adrenal glands and bilateral kidneys are normal in appearance. No hydroureteronephrosis. Urinary bladder is normal in appearance.  Stomach/Bowel: Previously placed pigtail drainage catheter is in a collection in the left lower quadrant of the abdomen that still contains some gas and fluid, measuring approximately 4.9 x 2.8 x 3.6 cm (images 70 of series 2, coronal image 34, and sagittal image 82), significantly smaller than the prior examination. This is intimately associated with the superior aspect of the proximal sigmoid colon, and several adjacent mid small bowel loops (presumably jejunal). One of the adjacent small bowel loops appears mildly thickened, best appreciated on image 69 of series 2. Additionally, there is extensive thickening of the colon in the region of the sigmoid colon, most severe in the proximal sigmoid colon,  where there is also surrounding inflammatory changes in the adjacent sigmoid mesocolon, and hypervascularity in the sigmoid mesocolon. Dilatation of the more proximal aspects of the proximal to mid small bowel, with bowel loops measuring up to 4.4  cm in diameter, containing multiple air-fluid levels. Distal small bowel is relatively decompressed, but not completely decompressed, and there is gas, fluid and stool throughout the colon. The appearance of the stomach and appendix are normal. Terminal ileum is unremarkable in appearance.  Vascular/Lymphatic: Mild atherosclerosis in the abdominal and pelvic vasculature, without evidence of aneurysm, dissection or major branch occlusion. Numerous reactive size lymph nodes are noted in the sigmoid mesocolon. No definite pathologically enlarged lymph nodes are noted in the abdomen or pelvis.  Reproductive: Prostate gland and seminal vesicles are unremarkable in appearance.  Other: Trace volume of ascites.  No pneumoperitoneum.  Musculoskeletal: There are no aggressive appearing lytic or blastic lesions noted in the visualized portions of the skeleton.  IMPRESSION: 1. Interval placement of a pigtail drainage catheter within a peritoneal abscess in the left lower quadrant of the abdomen which is intimately associated with adjacent proximal sigmoid colon and adjacent jejunal small bowel loops. While there is some dilatation of the proximal to mid small bowel, with multiple air-fluid levels, this is favored to reflect a reactive ileus rather than a bowel obstruction based on findings on today's examination. The size of the abscess has significantly decreased following placement of the drainage catheter, although there is still residual fluid and gas within the collection. No new abscesses are identified on today's examination. Trace volume of ascites, presumably reactive. 2. Extensive thickening of the sigmoid colon wall, and jejunum, presumably related to areas of activity in this patient with history of Crohn's disease. 3. No pneumoperitoneum. 4. Small left and trace right pleural effusions layering dependently. 5. Additional incidental findings, as above.   Electronically Signed   By: Vinnie Langton M.D.   On:  11/03/2014 17:49   Medications: I have reviewed the patient's current medications.  Assessment/Plan: 1) Crohn's disease complicated by C. Difficile colitis with a large sigmoid colon abscess on Vancomycin, Metronidazole and Rocephin-seems to be getting better. He has been encouraged to ambulate as much as possible. Continue present care. 2) Anemia of chronic disease.  3) Protein calorie malnutrition. 4) Ankylosing spondylitis.  LOS: 10 days   Stepen Prins 11/05/2014, 11:05 AM

## 2014-11-05 NOTE — Progress Notes (Signed)
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  Medications: Prior to Admission medications   Medication Sig Start Date End Date Taking? Authorizing Provider  Adalimumab 40 MG/0.8ML PNKT Inject 40 mg into the skin every 14 (fourteen) days.   Yes Historical Provider, MD  ALPRAZolam (XANAX) 0.25 MG tablet Take 0.25 mg by mouth 3 (three) times daily as needed for anxiety.   Yes Historical Provider, MD  azaTHIOprine (IMURAN) 50 MG tablet Take 3.5 tablets (175 mg total) by mouth daily. 10/10/14  Yes Carol Ada, MD  morphine (MS CONTIN) 30 MG 12 hr tablet Take 1 tablet (30 mg total) by mouth every 12 (twelve) hours. 10/10/14  Yes Carol Ada, MD  oxyCODONE-acetaminophen (PERCOCET/ROXICET) 5-325 MG per tablet Take 1 tablet by mouth every 6 (six) hours as needed for moderate pain or severe pain.   Yes Historical Provider, MD  predniSONE (DELTASONE) 20 MG tablet Take 2 tablets (40 mg total) by mouth daily with breakfast. Patient taking differently: Take 15 mg by mouth daily with breakfast.  10/10/14  Yes Carol Ada, MD  Probiotic Product (PROBIOTIC PO) Take 1 tablet by mouth daily. Chewable   Yes Historical Provider, MD  vancomycin (VANCOCIN) 50 mg/mL oral solution Take 10 mLs (500 mg total) by mouth every 6 (six) hours. Patient taking differently: Take 250 mg by mouth every 4 (four) hours.  10/10/14  Yes Carol Ada, MD     Vital Signs: BP 127/89 mmHg  Pulse 110  Temp(Src) 98.2 F (36.8 C) (Oral)  Resp 15  Ht 5\' 7"  (1.702 m)  Wt 70.534 kg (155 lb 8 oz)  BMI 24.35 kg/m2  SpO2 98%  Physical Exam  Abdominal: Soft. Bowel sounds are normal. There is tenderness.  Drain intact Clean and dry Output feculent 35 cc daily Cx: Citrobacter freundii and klebsiella oxytoca    Imaging: Ct  Abdomen Pelvis W Contrast  11/03/2014   CLINICAL DATA:  44 year old male with continued abdominal pain and low-grade fevers after drain placement. History of Crohn's disease.  EXAM: CT ABDOMEN AND PELVIS WITH CONTRAST  TECHNIQUE: Multidetector CT imaging of the abdomen and pelvis was performed using the standard protocol following bolus administration of intravenous contrast.  CONTRAST:  147mL OMNIPAQUE IOHEXOL 300 MG/ML  SOLN  COMPARISON:  CT the abdomen and pelvis 10/27/2014.  FINDINGS: Lower chest: Small left and trace right-sided pleural effusions lying dependently. Dependent subsegmental atelectasis in the lower lobes of the lungs bilaterally. Central venous catheter tip terminating at the superior cavoatrial junction.  Hepatobiliary: Sub cm low-attenuation lesion in segment 5 of the liver is too small to characterize, but is unchanged compared to prior studies, favored to represent a tiny cyst. No other aggressive appearing cystic or solid hepatic lesions are noted. No intra or extrahepatic biliary ductal dilatation. Gallbladder is normal in appearance.  Pancreas: Unremarkable.  Spleen: Unremarkable.  Adrenals/Urinary Tract: Bilateral adrenal glands and bilateral kidneys are normal in appearance. No hydroureteronephrosis. Urinary bladder is normal in appearance.  Stomach/Bowel: Previously placed pigtail drainage catheter is in a collection in the left lower quadrant of the abdomen that still contains some gas and fluid, measuring approximately 4.9 x 2.8 x 3.6 cm (images 70 of series 2, coronal image 34, and sagittal image 82), significantly smaller than the prior examination. This is intimately associated with the superior aspect of the  proximal sigmoid colon, and several adjacent mid small bowel loops (presumably jejunal). One of the adjacent small bowel loops appears mildly thickened, best appreciated on image 69 of series 2. Additionally, there is extensive thickening of the colon in the region of the  sigmoid colon, most severe in the proximal sigmoid colon, where there is also surrounding inflammatory changes in the adjacent sigmoid mesocolon, and hypervascularity in the sigmoid mesocolon. Dilatation of the more proximal aspects of the proximal to mid small bowel, with bowel loops measuring up to 4.4 cm in diameter, containing multiple air-fluid levels. Distal small bowel is relatively decompressed, but not completely decompressed, and there is gas, fluid and stool throughout the colon. The appearance of the stomach and appendix are normal. Terminal ileum is unremarkable in appearance.  Vascular/Lymphatic: Mild atherosclerosis in the abdominal and pelvic vasculature, without evidence of aneurysm, dissection or major branch occlusion. Numerous reactive size lymph nodes are noted in the sigmoid mesocolon. No definite pathologically enlarged lymph nodes are noted in the abdomen or pelvis.  Reproductive: Prostate gland and seminal vesicles are unremarkable in appearance.  Other: Trace volume of ascites.  No pneumoperitoneum.  Musculoskeletal: There are no aggressive appearing lytic or blastic lesions noted in the visualized portions of the skeleton.  IMPRESSION: 1. Interval placement of a pigtail drainage catheter within a peritoneal abscess in the left lower quadrant of the abdomen which is intimately associated with adjacent proximal sigmoid colon and adjacent jejunal small bowel loops. While there is some dilatation of the proximal to mid small bowel, with multiple air-fluid levels, this is favored to reflect a reactive ileus rather than a bowel obstruction based on findings on today's examination. The size of the abscess has significantly decreased following placement of the drainage catheter, although there is still residual fluid and gas within the collection. No new abscesses are identified on today's examination. Trace volume of ascites, presumably reactive. 2. Extensive thickening of the sigmoid colon wall,  and jejunum, presumably related to areas of activity in this patient with history of Crohn's disease. 3. No pneumoperitoneum. 4. Small left and trace right pleural effusions layering dependently. 5. Additional incidental findings, as above.   Electronically Signed   By: Vinnie Langton M.D.   On: 11/03/2014 17:49    Labs:  CBC:  Recent Labs  11/02/14 0549 11/03/14 0516 11/04/14 0545 11/05/14 0455  WBC 10.0 8.9 12.8* 10.5  HGB 7.4* 7.0* 9.3* 9.2*  HCT 23.8* 22.7* 30.3* 29.6*  PLT 648* 543* 569* 511*    COAGS:  Recent Labs  10/27/14 0630  INR 1.36  APTT 34    BMP:  Recent Labs  11/01/14 0534 11/02/14 0549 11/03/14 0516 11/04/14 0545  NA 134* 134* 134* 133*  K 3.1* 3.9 3.7 4.4  CL 99 103 104 101  CO2 32 26 25 26   GLUCOSE 121* 133* 119* 142*  BUN <5* <5* <5* 7  CALCIUM 7.3* 7.3* 7.1* 8.2*  CREATININE 0.62 0.54 0.51 0.59  GFRNONAA >90 >90 >90 >90  GFRAA >90 >90 >90 >90    LIVER FUNCTION TESTS:  Recent Labs  10/27/14 0630 10/28/14 0243 11/01/14 0534 11/02/14 0549  BILITOT 0.3 0.5 0.4 0.4  AST 10 10 19 17   ALT 7 6 9 9   ALKPHOS 68 65 56 57  PROT 5.3* 4.7* 4.7* 4.7*  ALBUMIN 1.7* 1.5* 1.5* 1.6*    Assessment and Plan:  abd abscess drain intact Reviewed 3/25 Ct with Dr Earleen Newport Plan per CCS Cont drain for now Improve nutrition  Signed: Trinetta Alemu A 11/05/2014, 11:12 AM   I spent a total of 15 Minutes in face to face in clinical consultation/evaluation, greater than 50% of which was counseling/coordinating care for abscess drain

## 2014-11-05 NOTE — Evaluation (Signed)
Physical Therapy Evaluation Patient Details Name: Vincent Black MRN: [id number] DOB: 1971-07-28 Today's Date: 11/05/2014   History of Present Illness  45 year old male with history of Crohn's disease, recently discharged from this hospital on 10/10/14 after being treated for C. difficile colitis. Patient presented to the hospital on 3/17 with abdominal pain. CT of the abdomen showed sigmoid abscess. Patient was then admitted for further evaluation and treatment. Patient was started on empiric antibiotics, kept nothing by mouth, gastrology,  underwent IR drain placement on 3/18. Repeat CT of the abdomen on 3/25 showed significant improvement  Clinical Impression  Patient doing well with mobility, no PT needs identified.  Patient does present with some generalized weakness due to prolonged hospitalization.  Encouraged patient to ambulate 3x/day with family/nursing and gave general exercise program.  Will sign off.  Please reconsult if needs change.      Follow Up Recommendations No PT follow up    Equipment Recommendations  None recommended by PT    Recommendations for Other Services       Precautions / Restrictions Precautions Precautions: None      Mobility  Bed Mobility Overal bed mobility: Independent                Transfers Overall transfer level: Independent                  Ambulation/Gait Ambulation/Gait assistance: Modified independent (Device/Increase time) Ambulation Distance (Feet): 100 Feet Assistive device:  (pushing IV pole) Gait Pattern/deviations: WFL(Within Functional Limits)     General Gait Details: also ambulated without IV pole, patient supervision x 20 feet.  no loss of balance even with perturbations.  Stairs            Wheelchair Mobility    Modified Rankin (Stroke Patients Only)       Balance Overall balance assessment: No apparent balance deficits (not formally assessed)                                            Pertinent Vitals/Pain Pain Assessment: 0-10 Pain Score: 8  (after ambulation) Pain Location: abdomen Pain Descriptors / Indicators: Cramping Pain Intervention(s): Limited activity within patient's tolerance;Repositioned;Relaxation    Home Living Family/patient expects to be discharged to:: Private residence Living Arrangements: Spouse/significant other Available Help at Discharge: Family Type of Home: House Home Access: Stairs to enter   Technical brewer of Steps: 3 Home Layout: One level Home Equipment: Cane - single point      Prior Function Level of Independence: Independent with assistive device(s)         Comments: was completely independent up until month ago, began using cane due to weakness due to c-diff     Hand Dominance        Extremity/Trunk Assessment   Upper Extremity Assessment: Generalized weakness           Lower Extremity Assessment: Generalized weakness      Cervical / Trunk Assessment: Normal  Communication   Communication: No difficulties  Cognition Arousal/Alertness: Awake/alert Behavior During Therapy: WFL for tasks assessed/performed Overall Cognitive Status: Within Functional Limits for tasks assessed                      General Comments      Exercises        Assessment/Plan    PT Assessment Patent does not  need any further PT services (needs to ambulate with family/nursing staff TID)  PT Diagnosis Generalized weakness   PT Problem List    PT Treatment Interventions     PT Goals (Current goals can be found in the Care Plan section) Acute Rehab PT Goals PT Goal Formulation: All assessment and education complete, DC therapy    Frequency     Barriers to discharge        Co-evaluation               End of Session   Activity Tolerance: Patient tolerated treatment well Patient left: in bed;with family/visitor present;with call bell/phone within reach           Time:  1002-1017 PT Time Calculation (min) (ACUTE ONLY): 15 min   Charges:   PT Evaluation $Initial PT Evaluation Tier I: 1 Procedure     PT G CodesShanna Cisco 11/05/2014, 10:22 AM  11/05/2014 [place], [place]

## 2014-11-06 LAB — DIFFERENTIAL
BASOS ABS: 0.1 10*3/uL (ref 0.0–0.1)
Basophils Relative: 1 % (ref 0–1)
EOS PCT: 2 % (ref 0–5)
Eosinophils Absolute: 0.2 10*3/uL (ref 0.0–0.7)
Lymphocytes Relative: 26 % (ref 12–46)
Lymphs Abs: 2.4 10*3/uL (ref 0.7–4.0)
Monocytes Absolute: 1 10*3/uL (ref 0.1–1.0)
Monocytes Relative: 11 % (ref 3–12)
Neutro Abs: 5.7 10*3/uL (ref 1.7–7.7)
Neutrophils Relative %: 60 % (ref 43–77)

## 2014-11-06 LAB — COMPREHENSIVE METABOLIC PANEL
ALK PHOS: 49 U/L (ref 39–117)
ALT: 7 U/L (ref 0–53)
ANION GAP: 7 (ref 5–15)
AST: 13 U/L (ref 0–37)
Albumin: 1.8 g/dL — ABNORMAL LOW (ref 3.5–5.2)
BILIRUBIN TOTAL: 0.3 mg/dL (ref 0.3–1.2)
CHLORIDE: 99 mmol/L (ref 96–112)
CO2: 26 mmol/L (ref 19–32)
CREATININE: 0.59 mg/dL (ref 0.50–1.35)
Calcium: 8.2 mg/dL — ABNORMAL LOW (ref 8.4–10.5)
GFR calc Af Amer: >90 mL/min (ref >90)
Glucose, Bld: 117 mg/dL — ABNORMAL HIGH (ref 70–99)
POTASSIUM: 3.9 mmol/L (ref 3.5–5.1)
Sodium: 132 mmol/L — ABNORMAL LOW (ref 135–145)
Total Protein: 5.8 g/dL — ABNORMAL LOW (ref 6.0–8.3)

## 2014-11-06 LAB — CBC
HEMATOCRIT: 29 % — AB (ref 39.0–52.0)
HEMOGLOBIN: 9.1 g/dL — AB (ref 13.0–17.0)
MCH: 28.7 pg (ref 26.0–34.0)
MCHC: 31.4 g/dL (ref 30.0–36.0)
MCV: 91.5 fL (ref 78.0–100.0)
Platelets: 497 10*3/uL — ABNORMAL HIGH (ref 150–400)
RBC: 3.17 MIL/uL — ABNORMAL LOW (ref 4.22–5.81)
RDW: 17.8 % — AB (ref 11.5–15.5)
WBC: 9.3 10*3/uL (ref 4.0–10.5)

## 2014-11-06 LAB — PHOSPHORUS: PHOSPHORUS: 4 mg/dL (ref 2.3–4.6)

## 2014-11-06 LAB — MAGNESIUM: Magnesium: 1.6 mg/dL (ref 1.5–2.5)

## 2014-11-06 LAB — TRIGLYCERIDES: TRIGLYCERIDES: 93 mg/dL (ref <150)

## 2014-11-06 MED ORDER — CIPROFLOXACIN HCL 500 MG PO TABS
500.0000 mg | ORAL_TABLET | Freq: Two times a day (BID) | ORAL | Status: DC
  Administered 2014-11-06 – 2014-11-08 (×5): 500 mg via ORAL
  Filled 2014-11-06 (×8): qty 1

## 2014-11-06 MED ORDER — METRONIDAZOLE 500 MG PO TABS
500.0000 mg | ORAL_TABLET | Freq: Three times a day (TID) | ORAL | Status: DC
  Administered 2014-11-06 – 2014-11-08 (×7): 500 mg via ORAL
  Filled 2014-11-06 (×11): qty 1

## 2014-11-06 NOTE — Progress Notes (Signed)
Patient ID: Vincent Black, male   DOB: 08/14/1970, 44 y.o.   MRN: [id number]    Subjective: Pt feeling better.  Tolerating full liquids.  Just had a BM.  No increase in pain  Objective: Vital signs in last 24 hours: Temp:  [98.4 F (36.9 C)-100.2 F (37.9 C)] 98.9 F (37.2 C) (03/28 0544) Pulse Rate:  [103-105] 105 (03/27 2124) Resp:  [16-17] 17 (03/28 0544) BP: (111-124)/(76-80) 111/80 mmHg (03/28 0544) SpO2:  [95 %-99 %] 95 % (03/28 0544) Weight:  [73.165 kg (161 lb 4.8 oz)] 73.165 kg (161 lb 4.8 oz) (03/28 0542) Last BM Date: 11/05/14  Intake/Output from previous day: 03/27 0701 - 03/28 0700 In: 825 [P.O.:120; IV Piggyback:250; TPN:450] Out: 2135 [Urine:2125; Drains:10] Intake/Output this shift:    PE: Abd: soft, appropriately tender in LLQ, JP drain with feculent output, +Bs Heart: regular Lungs: CTAB  Lab Results:   Recent Labs  11/05/14 0455 11/06/14 0455  WBC 10.5 9.3  HGB 9.2* 9.1*  HCT 29.6* 29.0*  PLT 511* 497*   BMET  Recent Labs  11/04/14 0545 11/06/14 0455  NA 133* 132*  K 4.4 3.9  CL 101 99  CO2 26 26  GLUCOSE 142* 117*  BUN 7 <5*  CREATININE 0.59 0.59  CALCIUM 8.2* 8.2*   PT/INR No results for input(s): LABPROT, INR in the last 72 hours. CMP     Component Value Date/Time   NA 132* 11/06/2014 0455   K 3.9 11/06/2014 0455   CL 99 11/06/2014 0455   CO2 26 11/06/2014 0455   GLUCOSE 117* 11/06/2014 0455   BUN <5* 11/06/2014 0455   CREATININE 0.59 11/06/2014 0455   CALCIUM 8.2* 11/06/2014 0455   PROT 5.8* 11/06/2014 0455   ALBUMIN 1.8* 11/06/2014 0455   AST 13 11/06/2014 0455   ALT 7 11/06/2014 0455   ALKPHOS 49 11/06/2014 0455   BILITOT 0.3 11/06/2014 0455   GFRNONAA >90 11/06/2014 0455   GFRAA >90 11/06/2014 0455   Lipase  No results found for: LIPASE     Studies/Results: No results found.  Anti-infectives: Anti-infectives    Start     Dose/Rate Route Frequency Ordered Stop   11/06/14 0945  ciprofloxacin (CIPRO)  tablet 500 mg     500 mg Oral 2 times daily 11/06/14 0941     11/06/14 0945  metroNIDAZOLE (FLAGYL) tablet 500 mg     500 mg Oral 3 times per day 11/06/14 0941     10/31/14 2000  cefTRIAXone (ROCEPHIN) 1 g in dextrose 5 % 50 mL IVPB - Premix  Status:  Discontinued     1 g 100 mL/hr over 30 Minutes Intravenous Every 24 hours 10/31/14 1855 11/06/14 0941   10/31/14 2000  metroNIDAZOLE (FLAGYL) IVPB 500 mg  Status:  Discontinued     500 mg 100 mL/hr over 60 Minutes Intravenous Every 8 hours 10/31/14 1855 11/06/14 0941   10/28/14 1200  piperacillin-tazobactam (ZOSYN) IVPB 3.375 g  Status:  Discontinued     3.375 g 12.5 mL/hr over 240 Minutes Intravenous Every 8 hours 10/28/14 1021 10/31/14 1855   10/26/14 2200  metroNIDAZOLE (FLAGYL) IVPB 500 mg  Status:  Discontinued     500 mg 100 mL/hr over 60 Minutes Intravenous Every 8 hours 10/26/14 2015 10/28/14 0943   10/26/14 2100  ciprofloxacin (CIPRO) IVPB 400 mg  Status:  Discontinued     400 mg 200 mL/hr over 60 Minutes Intravenous Every 12 hours 10/26/14 2027 10/28/14 1021   10/26/14 1900  fluconazole (DIFLUCAN) IVPB 100 mg  Status:  Discontinued     100 mg 50 mL/hr over 60 Minutes Intravenous Every 24 hours 10/26/14 1805 11/03/14 1251   10/26/14 1800  vancomycin (VANCOCIN) 50 mg/mL oral solution 125 mg     125 mg Oral 4 times per day 10/26/14 1655         Assessment/Plan  HD #11 Intra-abdominal abscess with crohn's disease -transition to oral cipro flagyl, along with oral vancomycin.  Had low grade 100.2 fever.  If this goes up on oral abx therapy, then he may need to be transitioned back to IV abx therapy. -IR placed a perc drain 10/27/14. 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  Will wean TNA as he is able to take in oral diet and  Breeze supplement -CT shows improvement in abscess -WBC stable Continue to monitor with conservative management.  -Would try to avoid surgery in this patient right now as much as possible unless he deteriorated -Pain well controlled with oral Oxy IR, takes 30mg  BID MS Contin home dose -ambulating better -will need to ask Dr. Dalbert Batman about when Humira can be restarted. Hypoalbuminemia/low prealbumin - TPN -Albumin is 1.6. Patient states he was eating great prior to admission; however, this is very low for a 44 yo male. Prealbumin pending today.  See above for TNA explanation. Will likely be discharged home soon. Recurrent C.diff - stool still positive 10/29/14 -Oral Vanc, ID following   LOS: 11 days    Vincent Black 11/06/2014, 9:41 AM Pager: [phone]

## 2014-11-06 NOTE — Progress Notes (Signed)
PARENTERAL NUTRITION CONSULT NOTE - FOLLOW UP  Pharmacy Consult:  TPN Indication:  Sigmoid abscess with high likelihood of fistula formation  No Known Allergies  Patient Measurements: Height: 5\' 7"  (170.2 cm) Weight: 161 lb 4.8 oz (73.165 kg) IBW/kg (Calculated) : 66.1  Usual weight = 96 kg Current weight = 76 kg  Vital Signs: Temp: 98.9 F (37.2 C) (03/28 0544) Temp Source: Oral (03/28 0544) BP: 111/80 mmHg (03/28 0544) Pulse Rate: 105 (03/27 2124) Intake/Output from previous day: 03/27 0701 - 03/28 0700 In: 825 [P.O.:120; IV Piggyback:250; TPN:450] Out: 2135 [Urine:2125; Drains:10]  Labs:  Recent Labs  11/04/14 0545 11/05/14 0455 11/06/14 0455  WBC 12.8* 10.5 9.3  HGB 9.3* 9.2* 9.1*  HCT 30.3* 29.6* 29.0*  PLT 569* 511* 497*     Recent Labs  11/04/14 0545 11/06/14 0455 11/06/14 0500  NA 133* 132*  --   K 4.4 3.9  --   CL 101 99  --   CO2 26 26  --   GLUCOSE 142* 117*  --   BUN 7 <5*  --   CREATININE 0.59 0.59  --   CALCIUM 8.2* 8.2*  --   MG  --  1.6  --   PHOS  --  4.0  --   PROT  --  5.8*  --   ALBUMIN  --  1.8*  --   AST  --  13  --   ALT  --  7  --   ALKPHOS  --  49  --   BILITOT  --  0.3  --   TRIG  --   --  93   Estimated Creatinine Clearance: 112.5 mL/min (by C-G formula based on Cr of 0.59).    Recent Labs  11/03/14 1738 11/04/14 0009 11/04/14 0519  GLUCAP 113* 139* 153*     Insulin Requirements in the past 24 hours:  SSI/CBG checks d/c'ed 3/26  Assessment: 14 YOM with history of Crohn's and recent C.diff colitis presented on 10/26/14 with worsening abdominal pain.  CT showed sigmoid abscess and is s/p drain placement on 10/27/14 by IR.  Medical team suspects fistulous connection from colon to abscess cavity.  Given likelihood of surgery, high suspicion of fistula and poor nutritional status (baseline prealbumin at 4.3 and significant weight loss), Pharmacy consulted to manage TPN.  GI: Crohn's on Humira, Imuran, prednisone PTA.   Intra-abd abscess drain yielded feculent material (resolved) >> improving and may not need surgery. Diet is full liquid. Baseline prealbumin 4.3.  Repeat CT on 3/25 showed reactive ileus instead of bowel obstruction, no abscess.  Still with feculent drainage but has improved 35 ml> 10 mls.  Abscess is draining well and size has markedly decreased. GI to d/w Surg about timing of immunosuppression and the abscess.  Surgery may advance to soft diet today.   Endo: no hx DM - CBGs acceptable with minimal SSI use so SSI d/c'ed 3/26 Lytes: mild hyponatremia, others WNL, Mag 1.6, phos 4 Renal: SCr stable, CrCL 113 ml/min - good UOP 1.2 ml/kg/hr, NS30K at 20 ml/hr Hepatobil: LFTs / tbili / TG WNL Neuro: hx anxiety and ankylosing spondylitis - MS Contin, receiving PRN Ativan + OxyIR + Zofran, pain score 0-7 ID: Diflucan and Zosyn changed to CTX/Flagyl for intra-abd abscess + PO Vanc for C.diff - Tmax 100.7, WBC wnl Best Practices: heparin SQ, MC TPN Access: PICC 10/31/14 TPN day#: 6 (3/22 >> )  Current Nutrition:  TPN at 40 ml/hr + full liquid diet  Anticipated goal: Clinimix E 5/15 at 100 ml/hr + IVFE at 7 ml/hr = 2040 kCal and 120gm of protein per day  Nutritional Goals:  1900-2100 kCal, 100-120 grams of protein per day   Plan:  -TPN to wean off today per CCS orders - no insulin in TPN so OK to stop TPN once this bag expires/completed -pharmacy to sign off thanks Eudelia Bunch, Sherian Rein.D. [phone] 11/06/2014 8:48 AM

## 2014-11-06 NOTE — Progress Notes (Signed)
PATIENT DETAILS Name: Vincent Black Age: 44 y.o. Sex: male Date of Birth: April 25, 1971 Admit Date: 10/26/2014 Admitting Physician Reyne Dumas, MD PCP:No PCP Per Patient  Brief narrative:  44 year old male with history of Crohn's disease, recently discharged from this hospital on 10/10/14 after being treated for C. difficile colitis. Patient presented to the hospital on 3/17 with abdominal pain. CT of the abdomen showed sigmoid abscess. Patient was then admitted for further evaluation and treatment. Patient was started on empiric antibiotics, kept nothing by mouth, gastroenterology, surgery,interventional radiology was consulted. She underwent IR drain placement on 3/18. Infectious disease directed antibiotic regimen. Has been started on TNA. Repeat CT of the abdomen on 3/25 showed significant improvement  Subjective: No major complaints this am.Tolearted full liquids  Assessment/Plan: Principal Problem:   Sepsis:sepsis pathophysiology has resolved. Secondary to intra-abdominal abscess. See below for Abx and cultures  Active Problems:   Pericolonic abscess: underwent pec drain placement on 3/18, cultures positive for Citrobacter and Klebsiella. ID consulted and directing Abx. Was on Rocephin-switched to Cipro by CCS. IR, GI, ID and [facility] following. Repeat CT Abd on 3/25 shows significant improvement.Pig tail drain in place.Diet has now been advanced to soft on 3/28.Continues to be on TNA as well. As clinically improves, hopefully will be able to wean off TNA and discontinue.   C Diff Colitis:On empiric oral Vanco and IV Flagyl    Crohn's disease: complicated by development of possible fistulous connection between abscess and colon. GI/CCS following. Abx as above. Currently not on any anti-inflammatories/immunosuppressives-which remain on hold.     Protein-calorie malnutrition, severe: Continue with TNA/oral supplements    Hyponatremia: mild, monitor for now    Chronic  Odynophagia:completed a course of fluconazole.    Chronic Pain Syndrome:MS Contin resumed, follow and adjust dosing.    Anemia: secondary to acute on chronic illness, transfused PRBC on 3/25. Follow CBC  Disposition: Remain inpatient-suspect home in 1-2 days  Antibiotics:  See below   Anti-infectives    Start     Dose/Rate Route Frequency Ordered Stop   11/06/14 1100  ciprofloxacin (CIPRO) tablet 500 mg     500 mg Oral 2 times daily 11/06/14 0941     11/06/14 0945  metroNIDAZOLE (FLAGYL) tablet 500 mg     500 mg Oral 3 times per day 11/06/14 0941     10/31/14 2000  cefTRIAXone (ROCEPHIN) 1 g in dextrose 5 % 50 mL IVPB - Premix  Status:  Discontinued     1 g 100 mL/hr over 30 Minutes Intravenous Every 24 hours 10/31/14 1855 11/06/14 0941   10/31/14 2000  metroNIDAZOLE (FLAGYL) IVPB 500 mg  Status:  Discontinued     500 mg 100 mL/hr over 60 Minutes Intravenous Every 8 hours 10/31/14 1855 11/06/14 0941   10/28/14 1200  piperacillin-tazobactam (ZOSYN) IVPB 3.375 g  Status:  Discontinued     3.375 g 12.5 mL/hr over 240 Minutes Intravenous Every 8 hours 10/28/14 1021 10/31/14 1855   10/26/14 2200  metroNIDAZOLE (FLAGYL) IVPB 500 mg  Status:  Discontinued     500 mg 100 mL/hr over 60 Minutes Intravenous Every 8 hours 10/26/14 2015 10/28/14 0943   10/26/14 2100  ciprofloxacin (CIPRO) IVPB 400 mg  Status:  Discontinued     400 mg 200 mL/hr over 60 Minutes Intravenous Every 12 hours 10/26/14 2027 10/28/14 1021   10/26/14 1900  fluconazole (DIFLUCAN) IVPB 100 mg  Status:  Discontinued     100 mg  50 mL/hr over 60 Minutes Intravenous Every 24 hours 10/26/14 1805 11/03/14 1251   10/26/14 1800  vancomycin (VANCOCIN) 50 mg/mL oral solution 125 mg     125 mg Oral 4 times per day 10/26/14 1655        DVT Prophylaxis: Prophylactic Heparin   Code Status: Full code   Family Communication Spouse at bedside  Procedures:  Perc Drain 3/18  CONSULTS:  ID, GI, general surgery and  IR   MEDICATIONS: Scheduled Meds: . sodium chloride   Intravenous Once  . ciprofloxacin  500 mg Oral BID  . feeding supplement (RESOURCE BREEZE)  1 Container Oral TID BM  . heparin  5,000 Units Subcutaneous 3 times per day  . magic mouthwash  10 mL Oral QID  . metroNIDAZOLE  500 mg Oral 3 times per day  . morphine  30 mg Oral Q12H  . pantoprazole  40 mg Oral Daily  . vancomycin  125 mg Oral 4 times per day   Continuous Infusions: . Marland KitchenTPN (CLINIMIX-E) Adult 40 mL/hr at 11/06/14 0500   And  . fat emulsion 120 mL (11/06/14 0500)  . 0.9 % sodium chloride with kcl 20 mL/hr at 11/04/14 2301   PRN Meds:.acetaminophen, LORazepam, methocarbamol, ondansetron (ZOFRAN) IV, oxyCODONE, sodium chloride    PHYSICAL EXAM: Vital signs in last 24 hours: Filed Vitals:   11/05/14 2305 11/06/14 0506 11/06/14 0542 11/06/14 0544  BP:    111/80  Pulse:      Temp: 99.2 F (37.3 C) 98.4 F (36.9 C)  98.9 F (37.2 C)  TempSrc: Oral Oral  Oral  Resp:    17  Height:      Weight:   73.165 kg (161 lb 4.8 oz)   SpO2:    95%    Weight change: 2.631 kg (5 lb 12.8 oz) Filed Weights   11/04/14 0500 11/05/14 0528 11/06/14 0542  Weight: 69 kg (152 lb 1.9 oz) 70.534 kg (155 lb 8 oz) 73.165 kg (161 lb 4.8 oz)   Body mass index is 25.26 kg/(m^2).   Gen Exam: Awake and alert with clear speech.   Neck: Supple, No JVD.   Chest: B/L Clear.  No rales or rhonchi CVS: S1 S2 Regular, no murmurs.  Abdomen: soft, BS +, non tender, non distended.  Extremities: no edema, lower extremities warm to touch. Neurologic: Non Focal.   Skin: No Rash.   Wounds: N/A.    Intake/Output from previous day:  Intake/Output Summary (Last 24 hours) at 11/06/14 1351 Last data filed at 11/06/14 0900  Gross per 24 hour  Intake    945 ml  Output   1610 ml  Net   -665 ml     LAB RESULTS: CBC  Recent Labs Lab 11/01/14 0534 11/02/14 0549 11/03/14 0516 11/04/14 0545 11/05/14 0455 11/06/14 0455  WBC 9.4 10.0 8.9  12.8* 10.5 9.3  HGB 7.2* 7.4* 7.0* 9.3* 9.2* 9.1*  HCT 22.5* 23.8* 22.7* 30.3* 29.6* 29.0*  PLT 674* 648* 543* 569* 511* 497*  MCV 93.4 94.1 94.6 92.4 92.2 91.5  MCH 29.9 29.2 29.2 28.4 28.7 28.7  MCHC 32.0 31.1 30.8 30.7 31.1 31.4  RDW 16.4* 17.0* 17.1* 17.9* 17.9* 17.8*  LYMPHSABS 2.4  --   --   --   --  2.4  MONOABS 0.7  --   --   --   --  1.0  EOSABS 0.2  --   --   --   --  0.2  BASOSABS 0.0  --   --   --   --  0.1    Chemistries   Recent Labs Lab 10/31/14 0640 11/01/14 0534 11/02/14 0549 11/03/14 0516 11/04/14 0545 11/06/14 0455  NA 131* 134* 134* 134* 133* 132*  K 4.0 3.1* 3.9 3.7 4.4 3.9  CL 94* 99 103 104 101 99  CO2 30 32 26 25 26 26   GLUCOSE 90 121* 133* 119* 142* 117*  BUN <5* <5* <5* <5* 7 <5*  CREATININE 0.80 0.62 0.54 0.51 0.59 0.59  CALCIUM 7.3* 7.3* 7.3* 7.1* 8.2* 8.2*  MG 1.9 1.8 1.9  --   --  1.6    CBG:  Recent Labs Lab 11/03/14 0608 11/03/14 1155 11/03/14 1738 11/04/14 0009 11/04/14 0519  GLUCAP 140* 135* 113* 139* 153*    GFR Estimated Creatinine Clearance: 112.5 mL/min (by C-G formula based on Cr of 0.59).  Coagulation profile No results for input(s): INR, PROTIME in the last 168 hours.  Cardiac Enzymes No results for input(s): CKMB, TROPONINI, MYOGLOBIN in the last 168 hours.  Invalid input(s): CK  Invalid input(s): POCBNP No results for input(s): DDIMER in the last 72 hours. No results for input(s): HGBA1C in the last 72 hours.  Recent Labs  11/06/14 0500  TRIG 93   No results for input(s): TSH, T4TOTAL, T3FREE, THYROIDAB in the last 72 hours.  Invalid input(s): FREET3 No results for input(s): VITAMINB12, FOLATE, FERRITIN, TIBC, IRON, RETICCTPCT in the last 72 hours. No results for input(s): LIPASE, AMYLASE in the last 72 hours.  Urine Studies No results for input(s): UHGB, CRYS in the last 72 hours.  Invalid input(s): UACOL, UAPR, USPG, UPH, UTP, UGL, UKET, UBIL, UNIT, UROB, ULEU, UEPI, UWBC, URBC, UBAC, CAST, UCOM,  BILUA  MICROBIOLOGY: Recent Results (from the past 240 hour(s))  Anaerobic culture     Status: None   Collection Time: 10/27/14  5:56 PM  Result Value Ref Range Status   Specimen Description ABSCESS ABDOMEN  Final   Special Requests NONE  Final   Gram Stain   Final    MODERATE WBC PRESENT,BOTH PMN AND MONONUCLEAR NO SQUAMOUS EPITHELIAL CELLS SEEN MODERATE GRAM POSITIVE COCCI IN PAIRS IN CHAINS IN CLUSTERS FEW GRAM NEGATIVE RODS Performed at [facility]    Culture   Final    NO ANAEROBES ISOLATED Performed at [facility]    Report Status 11/01/2014 FINAL  Final  Culture, routine-abscess     Status: None   Collection Time: 10/27/14  5:56 PM  Result Value Ref Range Status   Specimen Description ABSCESS ABDOMEN  Final   Special Requests NONE  Final   Gram Stain   Final    MODERATE WBC PRESENT,BOTH PMN AND MONONUCLEAR NO SQUAMOUS EPITHELIAL CELLS SEEN MODERATE GRAM POSITIVE COCCI IN PAIRS IN CHAINS IN CLUSTERS FEW GRAM NEGATIVE RODS Performed at [facility]    Culture   Final    ABUNDANT [place] Performed at [facility]    Report Status 10/31/2014 FINAL  Final   Organism ID, Bacteria CITROBACTER FREUNDII  Final   Organism ID, Bacteria KLEBSIELLA OXYTOCA  Final      Susceptibility   Citrobacter freundii - MIC*    CEFAZOLIN >=64 RESISTANT Resistant     CEFEPIME <=1 SENSITIVE Sensitive     CEFTAZIDIME <=1 SENSITIVE Sensitive     CEFTRIAXONE <=1 SENSITIVE Sensitive     CIPROFLOXACIN <=0.25 SENSITIVE Sensitive     GENTAMICIN <=1 SENSITIVE Sensitive     IMIPENEM <=0.25 SENSITIVE Sensitive     PIP/TAZO <=4  SENSITIVE Sensitive     TOBRAMYCIN <=1 SENSITIVE Sensitive     TRIMETH/SULFA <=20 SENSITIVE Sensitive     * ABUNDANT CITROBACTER FREUNDII   Klebsiella oxytoca - MIC*    AMPICILLIN RESISTANT      AMPICILLIN/SULBACTAM 4 SENSITIVE Sensitive     CEFAZOLIN <=4 SENSITIVE Sensitive     CEFEPIME <=1  SENSITIVE Sensitive     CEFTAZIDIME <=1 SENSITIVE Sensitive     CEFTRIAXONE <=1 SENSITIVE Sensitive     CIPROFLOXACIN <=0.25 SENSITIVE Sensitive     GENTAMICIN <=1 SENSITIVE Sensitive     IMIPENEM <=0.25 SENSITIVE Sensitive     PIP/TAZO <=4 SENSITIVE Sensitive     TOBRAMYCIN <=1 SENSITIVE Sensitive     TRIMETH/SULFA <=20 SENSITIVE Sensitive     * ABUNDANT KLEBSIELLA OXYTOCA  Clostridium Difficile by PCR     Status: Abnormal   Collection Time: 10/29/14  9:58 AM  Result Value Ref Range Status   C difficile by pcr POSITIVE (A) NEGATIVE Final    Comment: CRITICAL RESULT CALLED TO, READ BACK BY AND VERIFIED WITH: A.FORD,RN 10/30/14 @1420  BY V.WILKINS     RADIOLOGY STUDIES/RESULTS: Ct Abdomen Pelvis W Contrast  11/03/2014   CLINICAL DATA:  44 year old male with continued abdominal pain and low-grade fevers after drain placement. History of Crohn's disease.  EXAM: CT ABDOMEN AND PELVIS WITH CONTRAST  TECHNIQUE: Multidetector CT imaging of the abdomen and pelvis was performed using the standard protocol following bolus administration of intravenous contrast.  CONTRAST:  171mL OMNIPAQUE IOHEXOL 300 MG/ML  SOLN  COMPARISON:  CT the abdomen and pelvis 10/27/2014.  FINDINGS: Lower chest: Small left and trace right-sided pleural effusions lying dependently. Dependent subsegmental atelectasis in the lower lobes of the lungs bilaterally. Central venous catheter tip terminating at the superior cavoatrial junction.  Hepatobiliary: Sub cm low-attenuation lesion in segment 5 of the liver is too small to characterize, but is unchanged compared to prior studies, favored to represent a tiny cyst. No other aggressive appearing cystic or solid hepatic lesions are noted. No intra or extrahepatic biliary ductal dilatation. Gallbladder is normal in appearance.  Pancreas: Unremarkable.  Spleen: Unremarkable.  Adrenals/Urinary Tract: Bilateral adrenal glands and bilateral kidneys are normal in appearance. No  hydroureteronephrosis. Urinary bladder is normal in appearance.  Stomach/Bowel: Previously placed pigtail drainage catheter is in a collection in the left lower quadrant of the abdomen that still contains some gas and fluid, measuring approximately 4.9 x 2.8 x 3.6 cm (images 70 of series 2, coronal image 34, and sagittal image 82), significantly smaller than the prior examination. This is intimately associated with the superior aspect of the proximal sigmoid colon, and several adjacent mid small bowel loops (presumably jejunal). One of the adjacent small bowel loops appears mildly thickened, best appreciated on image 69 of series 2. Additionally, there is extensive thickening of the colon in the region of the sigmoid colon, most severe in the proximal sigmoid colon, where there is also surrounding inflammatory changes in the adjacent sigmoid mesocolon, and hypervascularity in the sigmoid mesocolon. Dilatation of the more proximal aspects of the proximal to mid small bowel, with bowel loops measuring up to 4.4 cm in diameter, containing multiple air-fluid levels. Distal small bowel is relatively decompressed, but not completely decompressed, and there is gas, fluid and stool throughout the colon. The appearance of the stomach and appendix are normal. Terminal ileum is unremarkable in appearance.  Vascular/Lymphatic: Mild atherosclerosis in the abdominal and pelvic vasculature, without evidence of aneurysm, dissection or major branch occlusion.  Numerous reactive size lymph nodes are noted in the sigmoid mesocolon. No definite pathologically enlarged lymph nodes are noted in the abdomen or pelvis.  Reproductive: Prostate gland and seminal vesicles are unremarkable in appearance.  Other: Trace volume of ascites.  No pneumoperitoneum.  Musculoskeletal: There are no aggressive appearing lytic or blastic lesions noted in the visualized portions of the skeleton.  IMPRESSION: 1. Interval placement of a pigtail drainage  catheter within a peritoneal abscess in the left lower quadrant of the abdomen which is intimately associated with adjacent proximal sigmoid colon and adjacent jejunal small bowel loops. While there is some dilatation of the proximal to mid small bowel, with multiple air-fluid levels, this is favored to reflect a reactive ileus rather than a bowel obstruction based on findings on today's examination. The size of the abscess has significantly decreased following placement of the drainage catheter, although there is still residual fluid and gas within the collection. No new abscesses are identified on today's examination. Trace volume of ascites, presumably reactive. 2. Extensive thickening of the sigmoid colon wall, and jejunum, presumably related to areas of activity in this patient with history of Crohn's disease. 3. No pneumoperitoneum. 4. Small left and trace right pleural effusions layering dependently. 5. Additional incidental findings, as above.   Electronically Signed   By: Vinnie Langton M.D.   On: 11/03/2014 17:49   Ct Abdomen Pelvis W Contrast  10/26/2014   CLINICAL DATA:  Generalized abdominal pain. Nausea and vomiting. Fever. C difficile colitis.  EXAM: CT ABDOMEN AND PELVIS WITH CONTRAST  TECHNIQUE: Multidetector CT imaging of the abdomen and pelvis was performed using the standard protocol following bolus administration of intravenous contrast.  CONTRAST:  18mL OMNIPAQUE IOHEXOL 300 MG/ML  SOLN  COMPARISON:  07/11/2014  FINDINGS: Lower Chest:  Unremarkable.  Hepatobiliary: Mild hepatic steatosis. No liver masses are identified. Gallbladder is unremarkable. Tiny sub-cm cyst again seen in the inferior right hepatic lobe.  Pancreas: No mass, inflammatory changes, or other significant abnormality identified.  Spleen:  Within normal limits in size and appearance.  Adrenals:  No masses identified.  Kidneys/Urinary Tract:  No evidence of masses or hydronephrosis.  Stomach/Bowel/Peritoneum: Mild diffuse  colonic wall thickening and mucosal enhancement is again demonstrated, consistent with diffuse colitis. However, there is a more cysts severe area of colonic wall thickening and pericolonic inflammatory change involving the proximal sigmoid colon with a large extraluminal collection containing gas stool and fluid which measures approximately 6.3 x 8.4 cm. This is consistent with a pericolonic abscess. There is no evidence of free fluid or bowel obstruction. No evidence of pneumatosis or portal venous gas.  Vascular/Lymphatic: No pathologically enlarged lymph nodes identified. No other significant abnormality visualized.  Reproductive:  No mass or other significant abnormality identified.  Other:  None.  Musculoskeletal:  No suspicious bone lesions identified.  IMPRESSION: Severe colitis or diverticulitis involving the proximal sigmoid colon, with large pericolonic abscess containing stool measuring approximately 6 x 8 cm. Background  Mild diffuse colitis also noted, without other areas of colonic perforation or abscess.  No evidence of free intraperitoneal air.  These results will be called to the ordering clinician or representative by the Radiologist Assistant, and communication documented in the PACS or zVision Dashboard.   Electronically Signed   By: Earle Gell M.D.   On: 10/26/2014 12:09   Ct Image Guided Drainage By Percutaneous Catheter  10/28/2014   CLINICAL DATA:  44 year old male with Crohn's disease and colonic perforation with large intraperitoneal abscess. CT-guided  drain placement is warranted.  EXAM: CT IMAGE GUIDED DRAINAGE BY PERCUTANEOUS CATHETER  Date: 10/28/2014  PROCEDURE: 1. Placement of 39 French drainage catheter under CT guidance Interventional Radiologist:  Criselda Peaches, MD  ANESTHESIA/SEDATION: Moderate (conscious) sedation was used. 2 mg Versed, 1 mcg Fentanyl were administered intravenously. The patient's vital signs were monitored continuously by radiology nursing throughout  the procedure.  Sedation Time: 12 minutes  MEDICATIONS: None additional  TECHNIQUE: Informed consent was obtained from the patient following explanation of the procedure, risks, benefits and alternatives. The patient understands, agrees and consents for the procedure. All questions were addressed. A time out was performed.  A planning axial CT scan was performed. The colonic perforation was localized. A suitable skin entry site was selected and marked. The region was then sterilely prepped and draped in standard fashion with Betadine skin prep. Local anesthesia was attained by infiltration with 1% lidocaine. Using intermittent CT fluoroscopic guidance, an 18 gauge trocar needle was advanced into the fluid collection. A 0.035 inch wire was then coiled within the fluid collection in the tract dilated to 14 [place]. A Cook 49 French drainage catheter was then advanced over the wire and formed within the fluid collection.  Aspiration yielded approximately 50 mL of frankly feculent material. A sample was sent for culture. The catheter was then flushed and secured to the skin with 0 Prolene suture. The catheter was connected to JP bulb suction. Post drainage CT imaging demonstrates near-total aspiration of the abscess cavity. There is no evidence of complication.  COMPLICATIONS: None  IMPRESSION: 1. Successful placement of a 14 French drainage catheter into the pericolonic fluid and gas collection. Aspiration the yields 50 mL frankly feculent material.  PLAN: 1. Maintain tube to JP bulb drainage until leukocytosis has normalized and clinical picture has improved. After that, recommend conversion to gravity bag drainage to minimize the risk for fistula formation. 2. Given the feculent nature of the fluid and the underlying Crohn's disease, patient is at very high likelihood of developing a fistulous connection between the catheter and bowel. Drainage catheter will likely be in place for an extended period. Recommend  contrast injection under fluoroscopy prior to drain removal. 3. Patient to be followed in IR drain clinic at 2 weeks post discharge. Signed,  Criselda Peaches, MD  [facility]  [facility]   Electronically Signed   By: [name] M.D.   On: 10/28/2014 08:47    Oren Binet, MD  [facility] Pager:[phone]  If 7PM-7AM, please contact night-coverage [email or web address] Password [email or web address] 11/06/2014, 1:51 PM   LOS: 11 days

## 2014-11-06 NOTE — Plan of Care (Signed)
Problem: Phase II Progression Outcomes Goal: IV changed to normal saline lock Outcome: Not Met (add Reason) Patient may go home with PICC line

## 2014-11-06 NOTE — Progress Notes (Addendum)
Patient ID: Vincent Black, male   DOB: 1971/04/19, 44 y.o.   MRN: [id number]         [facility]    Date of Admission:  10/26/2014           Day 12 IV antibiotics         Day 12 oral vancomycin  Principal Problem:   Pericolonic abscess Active Problems:   History of Clostridium difficile colitis   Odynophagia   Crohn's disease   Protein-calorie malnutrition, severe   Ankylosing spondylitis   Unintentional weight loss   Normocytic anemia   Left lower quadrant pain   . sodium chloride   Intravenous Once  . ciprofloxacin  500 mg Oral BID  . feeding supplement (RESOURCE BREEZE)  1 Container Oral TID BM  . heparin  5,000 Units Subcutaneous 3 times per day  . magic mouthwash  10 mL Oral QID  . metroNIDAZOLE  500 mg Oral 3 times per day  . morphine  30 mg Oral Q12H  . pantoprazole  40 mg Oral Daily  . vancomycin  125 mg Oral 4 times per day    Subjective: He developed some midline abdominal pain this morning after having a normal bowel movement. He's not having any more nausea or vomiting.  Review of Systems: Pertinent items are noted in HPI.  Past Medical History  Diagnosis Date  . Crohn disease   . C. difficile colitis 09/30/2014  . Ankylosing spondylitis 09/30/2014  . Anxiety     History  Substance Use Topics  . Smoking status: Former Smoker -- 0.50 packs/day for 10 years    Types: Cigarettes  . Smokeless tobacco: Never Used     Comment: 'quit smoking in ~ 2014"  . Alcohol Use: Yes     Comment: 10/25/2014 "might have a drink a couple times/yr"    History reviewed. No pertinent family history. No Known Allergies  OBJECTIVE: Blood pressure 111/80, pulse 105, temperature 98.9 F (37.2 C), temperature source Oral, resp. rate 17, height 5\' 7"  (1.702 m), weight 161 lb 4.8 oz (73.165 kg), SpO2 95 %. General: he appears slightly uncomfortable due to 2 abdominal pain Abdomen: there is a small amount of brown fluid in his drain. Active bowel  sounds.  Lab Results Lab Results  Component Value Date   WBC 9.3 11/06/2014   HGB 9.1* 11/06/2014   HCT 29.0* 11/06/2014   MCV 91.5 11/06/2014   PLT 497* 11/06/2014    Lab Results  Component Value Date   CREATININE 0.59 11/06/2014   BUN <5* 11/06/2014   NA 132* 11/06/2014   K 3.9 11/06/2014   CL 99 11/06/2014   CO2 26 11/06/2014    Lab Results  Component Value Date   ALT 7 11/06/2014   AST 13 11/06/2014   ALKPHOS 49 11/06/2014   BILITOT 0.3 11/06/2014     Microbiology: Recent Results (from the past 240 hour(s))  Anaerobic culture     Status: None   Collection Time: 10/27/14  5:56 PM  Result Value Ref Range Status   Specimen Description ABSCESS ABDOMEN  Final   Special Requests NONE  Final   Gram Stain   Final    MODERATE WBC PRESENT,BOTH PMN AND MONONUCLEAR NO SQUAMOUS EPITHELIAL CELLS SEEN MODERATE GRAM POSITIVE COCCI IN PAIRS IN CHAINS IN CLUSTERS FEW GRAM NEGATIVE RODS Performed at [facility]    Culture   Final    NO ANAEROBES ISOLATED Performed at [facility]  Report Status 11/01/2014 FINAL  Final  Culture, routine-abscess     Status: None   Collection Time: 10/27/14  5:56 PM  Result Value Ref Range Status   Specimen Description ABSCESS ABDOMEN  Final   Special Requests NONE  Final   Gram Stain   Final    MODERATE WBC PRESENT,BOTH PMN AND MONONUCLEAR NO SQUAMOUS EPITHELIAL CELLS SEEN MODERATE GRAM POSITIVE COCCI IN PAIRS IN CHAINS IN CLUSTERS FEW GRAM NEGATIVE RODS Performed at [facility]    Culture   Final    ABUNDANT CITROBACTER FREUNDII ABUNDANT KLEBSIELLA OXYTOCA Performed at [facility]    Report Status 10/31/2014 FINAL  Final   Organism ID, Bacteria CITROBACTER FREUNDII  Final   Organism ID, Bacteria KLEBSIELLA OXYTOCA  Final      Susceptibility   Citrobacter freundii - MIC*    CEFAZOLIN >=64 RESISTANT Resistant     CEFEPIME <=1 SENSITIVE Sensitive     CEFTAZIDIME <=1 SENSITIVE Sensitive      CEFTRIAXONE <=1 SENSITIVE Sensitive     CIPROFLOXACIN <=0.25 SENSITIVE Sensitive     GENTAMICIN <=1 SENSITIVE Sensitive     IMIPENEM <=0.25 SENSITIVE Sensitive     PIP/TAZO <=4 SENSITIVE Sensitive     TOBRAMYCIN <=1 SENSITIVE Sensitive     TRIMETH/SULFA <=20 SENSITIVE Sensitive     * ABUNDANT CITROBACTER FREUNDII   Klebsiella oxytoca - MIC*    AMPICILLIN RESISTANT      AMPICILLIN/SULBACTAM 4 SENSITIVE Sensitive     CEFAZOLIN <=4 SENSITIVE Sensitive     CEFEPIME <=1 SENSITIVE Sensitive     CEFTAZIDIME <=1 SENSITIVE Sensitive     CEFTRIAXONE <=1 SENSITIVE Sensitive     CIPROFLOXACIN <=0.25 SENSITIVE Sensitive     GENTAMICIN <=1 SENSITIVE Sensitive     IMIPENEM <=0.25 SENSITIVE Sensitive     PIP/TAZO <=4 SENSITIVE Sensitive     TOBRAMYCIN <=1 SENSITIVE Sensitive     TRIMETH/SULFA <=20 SENSITIVE Sensitive     * ABUNDANT KLEBSIELLA OXYTOCA  Clostridium Difficile by PCR     Status: Abnormal   Collection Time: 10/29/14  9:58 AM  Result Value Ref Range Status   C difficile by pcr POSITIVE (A) NEGATIVE Final    Comment: CRITICAL RESULT CALLED TO, READ BACK BY AND VERIFIED WITH: A.FORD,RN 10/30/14 @1420  BY V.WILKINS     Assessment: He is improving clinically and radiographically on therapy for his pericolonic abscess.  Plan: 1. Continue current antibiotics 2. I will followup on 11/08/2014  Michel Bickers, MD [facility] [phone] pager   [phone] cell 11/06/2014, 1:26 PM

## 2014-11-06 NOTE — Progress Notes (Signed)
Subjective: No complaints.  Feeling well.  Objective: Vital signs in last 24 hours: Temp:  [98.4 F (36.9 C)-100.2 F (37.9 C)] 98.9 F (37.2 C) (03/28 0544) Pulse Rate:  [103-105] 105 (03/27 2124) Resp:  [16-17] 17 (03/28 0544) BP: (111-124)/(76-80) 111/80 mmHg (03/28 0544) SpO2:  [95 %-99 %] 95 % (03/28 0544) Weight:  [73.165 kg (161 lb 4.8 oz)] 73.165 kg (161 lb 4.8 oz) (03/28 0542) Last BM Date: 11/05/14  Intake/Output from previous day: 03/27 0701 - 03/28 0700 In: 825 [P.O.:120; IV Piggyback:250; TPN:450] Out: 2135 [Urine:2125; Drains:10] Intake/Output this shift:    General appearance: alert and no distress GI: soft, non-tender; bowel sounds normal; no masses,  no organomegaly  Lab Results:  Recent Labs  11/04/14 0545 11/05/14 0455 11/06/14 0455  WBC 12.8* 10.5 9.3  HGB 9.3* 9.2* 9.1*  HCT 30.3* 29.6* 29.0*  PLT 569* 511* 497*   BMET  Recent Labs  11/04/14 0545 11/06/14 0455  NA 133* 132*  K 4.4 3.9  CL 101 99  CO2 26 26  GLUCOSE 142* 117*  BUN 7 <5*  CREATININE 0.59 0.59  CALCIUM 8.2* 8.2*   LFT  Recent Labs  11/06/14 0455  PROT 5.8*  ALBUMIN 1.8*  AST 13  ALT 7  ALKPHOS 49  BILITOT 0.3   PT/INR No results for input(s): LABPROT, INR in the last 72 hours. Hepatitis Panel No results for input(s): HEPBSAG, HCVAB, HEPAIGM, HEPBIGM in the last 72 hours. C-Diff No results for input(s): CDIFFTOX in the last 72 hours. Fecal Lactopherrin No results for input(s): FECLLACTOFRN in the last 72 hours.  Studies/Results: No results found.  Medications:  Scheduled: . sodium chloride   Intravenous Once  . cefTRIAXone (ROCEPHIN)  IV  1 g Intravenous Q24H  . feeding supplement (RESOURCE BREEZE)  1 Container Oral TID BM  . heparin  5,000 Units Subcutaneous 3 times per day  . magic mouthwash  10 mL Oral QID  . metronidazole  500 mg Intravenous Q8H  . morphine  30 mg Oral Q12H  . pantoprazole  40 mg Oral Daily  . vancomycin  125 mg Oral 4 times  per day   Continuous: . Marland KitchenTPN (CLINIMIX-E) Adult 40 mL/hr at 11/06/14 0500   And  . fat emulsion 120 mL (11/06/14 0500)  . 0.9 % sodium chloride with kcl 20 mL/hr at 11/04/14 2301    Assessment/Plan: 1) Sigmoid colon abscess. 2) Sigmoid colon colitis. 3) Crohn's disease.   He had a mild elevation in his temp last evening at 100.2.  No increase in his WBC.  Overall, clinically, he is well.  The CT scan reveals significant sigmoid colon inflammation, which I presume is secondary to his Crohn's disease.  The best chance to close the fistulous connection is with his Humira, but the abscess poses a problem.  I will discuss with Surgery their thoughts about the timing of immunosuppression and the abscess.  Currently, he is still draining well and his abscess size has markedly decreased.  Plan: 1) Continue with antibiotics. 2) Continue with TPN. 3) ? Timing for restarting immunosupression.   LOS: 11 days   Vincent Black 11/06/2014, 8:01 AM

## 2014-11-07 DIAGNOSIS — K651 Peritoneal abscess: Secondary | ICD-10-CM | POA: Insufficient documentation

## 2014-11-07 LAB — CBC
HEMATOCRIT: 29.4 % — AB (ref 39.0–52.0)
HEMOGLOBIN: 9.2 g/dL — AB (ref 13.0–17.0)
MCH: 28.8 pg (ref 26.0–34.0)
MCHC: 31.3 g/dL (ref 30.0–36.0)
MCV: 91.9 fL (ref 78.0–100.0)
PLATELETS: 497 10*3/uL — AB (ref 150–400)
RBC: 3.2 MIL/uL — ABNORMAL LOW (ref 4.22–5.81)
RDW: 17.5 % — AB (ref 11.5–15.5)
WBC: 9.6 10*3/uL (ref 4.0–10.5)

## 2014-11-07 LAB — PREALBUMIN: PREALBUMIN: 11 mg/dL — AB (ref 18.0–45.0)

## 2014-11-07 MED ORDER — ENSURE ENLIVE PO LIQD
237.0000 mL | Freq: Two times a day (BID) | ORAL | Status: DC
  Administered 2014-11-08: 237 mL via ORAL

## 2014-11-07 NOTE — Progress Notes (Signed)
NUTRITION FOLLOW UP  Intervention:   -D/c Resource Breeze po TID, each supplement provides 250 kcal and 9 grams of protein due to poor acceptance -Ensure Enlive po BID, each supplement provides 350 kcal and 20 grams of protein  Nutrition Dx:   Inadequate oral intake related to decreased appetite as evidenced by abdominal pain and pt report; ongoing  Goal:   Pt to meet >/= 90% of estimated needs; not met  Monitor:   PO/supplement intake, labs, weight changes, I/O's  Assessment:   Pt with PMH of Chron's disease, c. diff colitis. Presents with abdominal pain on left side ongoing for the past few months and worsening over the last few days. Pt having bloody diarrhea, has resolved.   TPN was d/c on 11/06/14.  Spoke with RN who confirms pt with continued poor appetite. Noted 10-25% meal completion. Pt was advanced to a soft diet today. RN reports pt tolerated well, but has a chronically poor appetite at baseline.  Pt has been refusing Resource Breeze supplement, due to disliking the taste. Will d/c due to poor acceptance and order Ensure.  Wt has remained stable since admission. Labs reviewed. Na: 132, BUN <5, Calcium: 8.2, Glucose: 117. Mg, K, and Phos WDL.   Height: Ht Readings from Last 1 Encounters:  10/26/14 5' 7"  (1.702 m)    Weight Status:   Wt Readings from Last 1 Encounters:  11/07/14 161 lb 4.8 oz (73.165 kg)   11/01/14 162 lb 11.2 oz (73.8 kg)       Re-estimated needs:  Kcal: 1900-2100 kcal Protein: 100-120 g protein Fluid: >/= 1.9   Skin: LLQ JP drain  Diet Order: DIET SOFT Room service appropriate?: Yes; Fluid consistency:: Thin   Intake/Output Summary (Last 24 hours) at 11/07/14 1413 Last data filed at 11/07/14 1320  Gross per 24 hour  Intake 973.67 ml  Output    625 ml  Net 348.67 ml    Last BM: 11/05/14   Labs:   Recent Labs Lab 11/01/14 0534 11/02/14 0549 11/03/14 0516 11/04/14 0545 11/06/14 0455  NA 134* 134* 134* 133* 132*  K 3.1* 3.9  3.7 4.4 3.9  CL 99 103 104 101 99  CO2 32 26 25 26 26   BUN <5* <5* <5* 7 <5*  CREATININE 0.62 0.54 0.51 0.59 0.59  CALCIUM 7.3* 7.3* 7.1* 8.2* 8.2*  MG 1.8 1.9  --   --  1.6  PHOS 2.7 2.6  --   --  4.0  GLUCOSE 121* 133* 119* 142* 117*    CBG (last 3)  No results for input(s): GLUCAP in the last 72 hours.  Scheduled Meds: . sodium chloride   Intravenous Once  . ciprofloxacin  500 mg Oral BID  . feeding supplement (RESOURCE BREEZE)  1 Container Oral TID BM  . heparin  5,000 Units Subcutaneous 3 times per day  . magic mouthwash  10 mL Oral QID  . metroNIDAZOLE  500 mg Oral 3 times per day  . morphine  30 mg Oral Q12H  . pantoprazole  40 mg Oral Daily  . vancomycin  125 mg Oral 4 times per day    Continuous Infusions: . 0.9 % sodium chloride with kcl 20 mL/hr at 11/07/14 1148    Avien Taha A. Jimmye Norman, RD, LDN, CDE Pager: [phone] After hours Pager: [phone]

## 2014-11-07 NOTE — Progress Notes (Signed)
Referring Physician(s): Hung,Patrick  Subjective:  abd abscess drain placed 3/18 Still in place Feculent Eating better ambulating some  Allergies: Review of patient's allergies indicates no known allergies.  Medications: Prior to Admission medications   Medication Sig Start Date End Date Taking? Authorizing Provider  Adalimumab 40 MG/0.8ML PNKT Inject 40 mg into the skin every 14 (fourteen) days.   Yes Historical Provider, MD  ALPRAZolam (XANAX) 0.25 MG tablet Take 0.25 mg by mouth 3 (three) times daily as needed for anxiety.   Yes Historical Provider, MD  azaTHIOprine (IMURAN) 50 MG tablet Take 3.5 tablets (175 mg total) by mouth daily. 10/10/14  Yes Carol Ada, MD  morphine (MS CONTIN) 30 MG 12 hr tablet Take 1 tablet (30 mg total) by mouth every 12 (twelve) hours. 10/10/14  Yes Carol Ada, MD  oxyCODONE-acetaminophen (PERCOCET/ROXICET) 5-325 MG per tablet Take 1 tablet by mouth every 6 (six) hours as needed for moderate pain or severe pain.   Yes Historical Provider, MD  predniSONE (DELTASONE) 20 MG tablet Take 2 tablets (40 mg total) by mouth daily with breakfast. Patient taking differently: Take 15 mg by mouth daily with breakfast.  10/10/14  Yes Carol Ada, MD  Probiotic Product (PROBIOTIC PO) Take 1 tablet by mouth daily. Chewable   Yes Historical Provider, MD  vancomycin (VANCOCIN) 50 mg/mL oral solution Take 10 mLs (500 mg total) by mouth every 6 (six) hours. Patient taking differently: Take 250 mg by mouth every 4 (four) hours.  10/10/14  Yes Carol Ada, MD     Vital Signs: BP 113/76 mmHg  Pulse 105  Temp(Src) 98 F (36.7 C) (Oral)  Resp 16  Ht 5\' 7"  (1.702 m)  Wt 73.165 kg (161 lb 4.8 oz)  BMI 25.26 kg/m2  SpO2 97%  Physical Exam  Abdominal: Soft. Bowel sounds are normal. There is no tenderness.  Site of drain is clean and dry NT Output not recorded Pt has a collection from this am of 10 cc- brownish fluid Scant in JP afeb     Imaging: Ct Abdomen  Pelvis W Contrast  11/03/2014   CLINICAL DATA:  44 year old male with continued abdominal pain and low-grade fevers after drain placement. History of Crohn's disease.  EXAM: CT ABDOMEN AND PELVIS WITH CONTRAST  TECHNIQUE: Multidetector CT imaging of the abdomen and pelvis was performed using the standard protocol following bolus administration of intravenous contrast.  CONTRAST:  139mL OMNIPAQUE IOHEXOL 300 MG/ML  SOLN  COMPARISON:  CT the abdomen and pelvis 10/27/2014.  FINDINGS: Lower chest: Small left and trace right-sided pleural effusions lying dependently. Dependent subsegmental atelectasis in the lower lobes of the lungs bilaterally. Central venous catheter tip terminating at the superior cavoatrial junction.  Hepatobiliary: Sub cm low-attenuation lesion in segment 5 of the liver is too small to characterize, but is unchanged compared to prior studies, favored to represent a tiny cyst. No other aggressive appearing cystic or solid hepatic lesions are noted. No intra or extrahepatic biliary ductal dilatation. Gallbladder is normal in appearance.  Pancreas: Unremarkable.  Spleen: Unremarkable.  Adrenals/Urinary Tract: Bilateral adrenal glands and bilateral kidneys are normal in appearance. No hydroureteronephrosis. Urinary bladder is normal in appearance.  Stomach/Bowel: Previously placed pigtail drainage catheter is in a collection in the left lower quadrant of the abdomen that still contains some gas and fluid, measuring approximately 4.9 x 2.8 x 3.6 cm (images 70 of series 2, coronal image 34, and sagittal image 82), significantly smaller than the prior examination. This is intimately  associated with the superior aspect of the proximal sigmoid colon, and several adjacent mid small bowel loops (presumably jejunal). One of the adjacent small bowel loops appears mildly thickened, best appreciated on image 69 of series 2. Additionally, there is extensive thickening of the colon in the region of the sigmoid  colon, most severe in the proximal sigmoid colon, where there is also surrounding inflammatory changes in the adjacent sigmoid mesocolon, and hypervascularity in the sigmoid mesocolon. Dilatation of the more proximal aspects of the proximal to mid small bowel, with bowel loops measuring up to 4.4 cm in diameter, containing multiple air-fluid levels. Distal small bowel is relatively decompressed, but not completely decompressed, and there is gas, fluid and stool throughout the colon. The appearance of the stomach and appendix are normal. Terminal ileum is unremarkable in appearance.  Vascular/Lymphatic: Mild atherosclerosis in the abdominal and pelvic vasculature, without evidence of aneurysm, dissection or major branch occlusion. Numerous reactive size lymph nodes are noted in the sigmoid mesocolon. No definite pathologically enlarged lymph nodes are noted in the abdomen or pelvis.  Reproductive: Prostate gland and seminal vesicles are unremarkable in appearance.  Other: Trace volume of ascites.  No pneumoperitoneum.  Musculoskeletal: There are no aggressive appearing lytic or blastic lesions noted in the visualized portions of the skeleton.  IMPRESSION: 1. Interval placement of a pigtail drainage catheter within a peritoneal abscess in the left lower quadrant of the abdomen which is intimately associated with adjacent proximal sigmoid colon and adjacent jejunal small bowel loops. While there is some dilatation of the proximal to mid small bowel, with multiple air-fluid levels, this is favored to reflect a reactive ileus rather than a bowel obstruction based on findings on today's examination. The size of the abscess has significantly decreased following placement of the drainage catheter, although there is still residual fluid and gas within the collection. No new abscesses are identified on today's examination. Trace volume of ascites, presumably reactive. 2. Extensive thickening of the sigmoid colon wall, and  jejunum, presumably related to areas of activity in this patient with history of Crohn's disease. 3. No pneumoperitoneum. 4. Small left and trace right pleural effusions layering dependently. 5. Additional incidental findings, as above.   Electronically Signed   By: Vinnie Langton M.D.   On: 11/03/2014 17:49    Labs:  CBC:  Recent Labs  11/03/14 0516 11/04/14 0545 11/05/14 0455 11/06/14 0455  WBC 8.9 12.8* 10.5 9.3  HGB 7.0* 9.3* 9.2* 9.1*  HCT 22.7* 30.3* 29.6* 29.0*  PLT 543* 569* 511* 497*    COAGS:  Recent Labs  10/27/14 0630  INR 1.36  APTT 34    BMP:  Recent Labs  11/02/14 0549 11/03/14 0516 11/04/14 0545 11/06/14 0455  NA 134* 134* 133* 132*  K 3.9 3.7 4.4 3.9  CL 103 104 101 99  CO2 26 25 26 26   GLUCOSE 133* 119* 142* 117*  BUN <5* <5* 7 <5*  CALCIUM 7.3* 7.1* 8.2* 8.2*  CREATININE 0.54 0.51 0.59 0.59  GFRNONAA >90 >90 >90 >90  GFRAA >90 >90 >90 >90    LIVER FUNCTION TESTS:  Recent Labs  10/28/14 0243 11/01/14 0534 11/02/14 0549 11/06/14 0455  BILITOT 0.5 0.4 0.4 0.3  AST 10 19 17 13   ALT 6 9 9 7   ALKPHOS 65 56 57 49  PROT 4.7* 4.7* 4.7* 5.8*  ALBUMIN 1.5* 1.5* 1.6* 1.8*    Assessment and Plan:  abd abscess drain intact +fistula Plan per [facility]CS We can see in  Rad drain clinic after dc   Signed: Eithan Beagle A 11/07/2014, 10:43 AM   I spent a total of 15 Minutes in face to face in clinical consultation/evaluation, greater than 50% of which was counseling/coordinating care for abd abscess drain

## 2014-11-07 NOTE — Progress Notes (Signed)
Patient ID: Vincent Black, male   DOB: 03/24/1971, 44 y.o.   MRN: [id number]    Subjective: Patient feels much better today.  Pain is much better.  Tolerating soft diet, but wanting more thicker soups, mashed potatoes, etc than chicken and more solid food.    Objective: Vital signs in last 24 hours: Temp:  [98 F (36.7 C)-99.5 F (37.5 C)] 98 F (36.7 C) (03/29 0535) Pulse Rate:  [105-113] 105 (03/29 0535) Resp:  [16-17] 16 (03/29 0535) BP: (113-123)/(76-84) 113/76 mmHg (03/29 0535) SpO2:  [95 %-97 %] 97 % (03/29 0535) Weight:  [73.165 kg (161 lb 4.8 oz)] 73.165 kg (161 lb 4.8 oz) (03/29 0535) Last BM Date: 11/05/14  Intake/Output from previous day: 03/28 0701 - 03/29 0700 In: 758.7 [P.O.:480; I.V.:258.7] Out: 1175 [Urine:1175] Intake/Output this shift:    PE: Abd: soft, much less tender today, drain still with feculent output, +BS Heart: regular Lungs: CTAB  Lab Results:   Recent Labs  11/05/14 0455 11/06/14 0455  WBC 10.5 9.3  HGB 9.2* 9.1*  HCT 29.6* 29.0*  PLT 511* 497*   BMET  Recent Labs  11/06/14 0455  NA 132*  K 3.9  CL 99  CO2 26  GLUCOSE 117*  BUN <5*  CREATININE 0.59  CALCIUM 8.2*   PT/INR No results for input(s): LABPROT, INR in the last 72 hours. CMP     Component Value Date/Time   NA 132* 11/06/2014 0455   K 3.9 11/06/2014 0455   CL 99 11/06/2014 0455   CO2 26 11/06/2014 0455   GLUCOSE 117* 11/06/2014 0455   BUN <5* 11/06/2014 0455   CREATININE 0.59 11/06/2014 0455   CALCIUM 8.2* 11/06/2014 0455   PROT 5.8* 11/06/2014 0455   ALBUMIN 1.8* 11/06/2014 0455   AST 13 11/06/2014 0455   ALT 7 11/06/2014 0455   ALKPHOS 49 11/06/2014 0455   BILITOT 0.3 11/06/2014 0455   GFRNONAA >90 11/06/2014 0455   GFRAA >90 11/06/2014 0455   Lipase  No results found for: LIPASE     Studies/Results: No results found.  Anti-infectives: Anti-infectives    Start     Dose/Rate Route Frequency Ordered Stop   11/06/14 1100  ciprofloxacin  (CIPRO) tablet 500 mg     500 mg Oral 2 times daily 11/06/14 0941     11/06/14 0945  metroNIDAZOLE (FLAGYL) tablet 500 mg     500 mg Oral 3 times per day 11/06/14 0941     10/31/14 2000  cefTRIAXone (ROCEPHIN) 1 g in dextrose 5 % 50 mL IVPB - Premix  Status:  Discontinued     1 g 100 mL/hr over 30 Minutes Intravenous Every 24 hours 10/31/14 1855 11/06/14 0941   10/31/14 2000  metroNIDAZOLE (FLAGYL) IVPB 500 mg  Status:  Discontinued     500 mg 100 mL/hr over 60 Minutes Intravenous Every 8 hours 10/31/14 1855 11/06/14 0941   10/28/14 1200  piperacillin-tazobactam (ZOSYN) IVPB 3.375 g  Status:  Discontinued     3.375 g 12.5 mL/hr over 240 Minutes Intravenous Every 8 hours 10/28/14 1021 10/31/14 1855   10/26/14 2200  metroNIDAZOLE (FLAGYL) IVPB 500 mg  Status:  Discontinued     500 mg 100 mL/hr over 60 Minutes Intravenous Every 8 hours 10/26/14 2015 10/28/14 0943   10/26/14 2100  ciprofloxacin (CIPRO) IVPB 400 mg  Status:  Discontinued     400 mg 200 mL/hr over 60 Minutes Intravenous Every 12 hours 10/26/14 2027 10/28/14 1021   10/26/14 1900  fluconazole (DIFLUCAN) IVPB 100 mg  Status:  Discontinued     100 mg 50 mL/hr over 60 Minutes Intravenous Every 24 hours 10/26/14 1805 11/03/14 1251   10/26/14 1800  vancomycin (VANCOCIN) 50 mg/mL oral solution 125 mg     125 mg Oral 4 times per day 10/26/14 1655         Assessment/Plan   HD #12 Intra-abdominal abscess with crohn's disease -On oral cipro flagyl, along with oral vancomycin.no further low grade fevers.  Patient actually looks better today. -IR placed a perc drain 10/27/14. 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  Discussed this with his wife and father.  Cont [employer] for additional nutritional support.  Prealbumin still pending. -CT shows improvement in  abscess -WBC stable Continue to monitor with conservative management.  -Would try to avoid surgery in this patient right now as much as possible unless he deteriorated -Pain well controlled with oral Oxy IR, takes 30mg  BID MS Contin home dose -ambulating better -no immunosuppressive therapy until abscess and fistula have healed. -hopefully able to dc home soon, next 24-72 hours, depending on how well he does with oral meds, fevers, WBC, and nutritional intake Hypoalbuminemia/low prealbumin - TPN -Albumin is 1.8. Prealbumin still pending today. Recurrent C.diff - stool still positive 10/29/14 -Oral Vanc   LOS: 12 days    Haley Fuerstenberg E 11/07/2014, 9:31 AM Pager: [phone]

## 2014-11-07 NOTE — Progress Notes (Signed)
Subjective: No complaints.  Feeling well at this time.  No fever.  Objective: Vital signs in last 24 hours: Temp:  [98 F (36.7 C)-99.5 F (37.5 C)] 98 F (36.7 C) (03/29 0535) Pulse Rate:  [105-113] 105 (03/29 0535) Resp:  [16-17] 16 (03/29 0535) BP: (113-123)/(76-84) 113/76 mmHg (03/29 0535) SpO2:  [95 %-97 %] 97 % (03/29 0535) Weight:  [73.165 kg (161 lb 4.8 oz)] 73.165 kg (161 lb 4.8 oz) (03/29 0535) Last BM Date: 11/05/14  Intake/Output from previous day: 03/28 0701 - 03/29 0700 In: 758.7 [P.O.:480; I.V.:258.7] Out: 1175 [Urine:1175] Intake/Output this shift:    General appearance: alert and no distress GI: soft, non-tender; bowel sounds normal; no masses,  no organomegaly  Lab Results:  Recent Labs  11/05/14 0455 11/06/14 0455  WBC 10.5 9.3  HGB 9.2* 9.1*  HCT 29.6* 29.0*  PLT 511* 497*   BMET  Recent Labs  11/06/14 0455  NA 132*  K 3.9  CL 99  CO2 26  GLUCOSE 117*  BUN <5*  CREATININE 0.59  CALCIUM 8.2*   LFT  Recent Labs  11/06/14 0455  PROT 5.8*  ALBUMIN 1.8*  AST 13  ALT 7  ALKPHOS 49  BILITOT 0.3   PT/INR No results for input(s): LABPROT, INR in the last 72 hours. Hepatitis Panel No results for input(s): HEPBSAG, HCVAB, HEPAIGM, HEPBIGM in the last 72 hours. C-Diff No results for input(s): CDIFFTOX in the last 72 hours. Fecal Lactopherrin No results for input(s): FECLLACTOFRN in the last 72 hours.  Studies/Results: No results found.  Medications:  Scheduled: . sodium chloride   Intravenous Once  . ciprofloxacin  500 mg Oral BID  . feeding supplement (RESOURCE BREEZE)  1 Container Oral TID BM  . heparin  5,000 Units Subcutaneous 3 times per day  . magic mouthwash  10 mL Oral QID  . metroNIDAZOLE  500 mg Oral 3 times per day  . morphine  30 mg Oral Q12H  . pantoprazole  40 mg Oral Daily  . vancomycin  125 mg Oral 4 times per day   Continuous: . 0.9 % sodium chloride with kcl 20 mL/hr at 11/04/14 2301     Assessment/Plan: 1) Sigmoid colon abscess. 2) Crohn's disease. 3) C. Diff colitis.   Clinically he continues to improve.  PO intake has increased and TPN discontinued.   He is ambulating.  Surgery wants to hold off immunosuppression.  Appreciate input.  Plan: 1) Continue with present care.  No new recommendations.  LOS: 12 days   Amritpal Shropshire D 11/07/2014, 7:20 AM

## 2014-11-07 NOTE — Progress Notes (Addendum)
PATIENT DETAILS Name: Vincent Black Age: 44 y.o. Sex: male Date of Birth: 1970/09/13 Admit Date: 10/26/2014 Admitting Physician Reyne Dumas, MD PCP:No PCP Per Patient  Brief narrative:  44 year old male with history of Crohn's disease, recently discharged from this hospital on 10/10/14 after being treated for C. difficile colitis. Patient presented to the hospital on 3/17 with abdominal pain. CT of the abdomen showed sigmoid abscess. Patient was then admitted for further evaluation and treatment. Patient was started on empiric antibiotics, kept nothing by mouth, gastroenterology, surgery,interventional radiology was consulted. She underwent IR drain placement on 3/18. Infectious disease directed antibiotic regimen. Has been started on TNA. Repeat CT of the abdomen on 3/25 showed significant improvement  Subjective: Tolerating soft diet, but wanting more thicker soups, mashed potatoes, etc than chicken and more solid food.   Assessment/Plan:   Sepsis:sepsis pathophysiology has resolved. Secondary to intra-abdominal abscess. See below for Abx and CXs show Citrobacter freundii & Klebsiella oxytoca     Pericolonic abscess: underwent pec drain placement on 3/18, cultures positive for Citrobacter and Klebsiella. ID consulted and directing Abx. Was on Rocephin-switched to Cipro by CCS. IR, GI, ID and CCS following. Repeat CT Abd on 3/25 shows significant improvement.Pig tail drain in place.Diet has now been advanced to soft on 3/28. TPN was d/c on 11/06/14.. Would try to avoid surgery in this patient right now as much as possible unless he deteriorated -Pain well controlled with oral Oxy IR, takes 30mg  BID MS Contin home dose -ambulating better -no immunosuppressive therapy until abscess and fistula have healed. -hopefully able to dc home soon, next 24-72 hours, depending on how well he does with oral meds, fevers, WBC, and nutritional intake   C Diff Colitis:On empiric oral Vanco and  changed iv to po Flagyl     Crohn's disease: complicated by development of possible fistulous connection between abscess and colon. GI/CCS following. Abx as above. C    Protein-calorie malnutrition, severe: Continue with TNA/oral supplements    Hyponatremia: mild, monitor for now    Chronic Odynophagia:completed a course of fluconazole.    Chronic Pain Syndrome:MS Contin resumed, follow and adjust dosing.    Anemia: secondary to acute on chronic illness, transfused PRBC on 3/25. Stable  CBC  Disposition: home soon, next 24-72 hours per surgery  Antibiotics:  See below   Anti-infectives    Start     Dose/Rate Route Frequency Ordered Stop   11/06/14 1100  ciprofloxacin (CIPRO) tablet 500 mg     500 mg Oral 2 times daily 11/06/14 0941     11/06/14 0945  metroNIDAZOLE (FLAGYL) tablet 500 mg     500 mg Oral 3 times per day 11/06/14 0941     10/31/14 2000  cefTRIAXone (ROCEPHIN) 1 g in dextrose 5 % 50 mL IVPB - Premix  Status:  Discontinued     1 g 100 mL/hr over 30 Minutes Intravenous Every 24 hours 10/31/14 1855 11/06/14 0941   10/31/14 2000  metroNIDAZOLE (FLAGYL) IVPB 500 mg  Status:  Discontinued     500 mg 100 mL/hr over 60 Minutes Intravenous Every 8 hours 10/31/14 1855 11/06/14 0941   10/28/14 1200  piperacillin-tazobactam (ZOSYN) IVPB 3.375 g  Status:  Discontinued     3.375 g 12.5 mL/hr over 240 Minutes Intravenous Every 8 hours 10/28/14 1021 10/31/14 1855   10/26/14 2200  metroNIDAZOLE (FLAGYL) IVPB 500 mg  Status:  Discontinued     500 mg 100 mL/hr over  60 Minutes Intravenous Every 8 hours 10/26/14 2015 10/28/14 0943   10/26/14 2100  ciprofloxacin (CIPRO) IVPB 400 mg  Status:  Discontinued     400 mg 200 mL/hr over 60 Minutes Intravenous Every 12 hours 10/26/14 2027 10/28/14 1021   10/26/14 1900  fluconazole (DIFLUCAN) IVPB 100 mg  Status:  Discontinued     100 mg 50 mL/hr over 60 Minutes Intravenous Every 24 hours 10/26/14 1805 11/03/14 1251   10/26/14 1800   vancomycin (VANCOCIN) 50 mg/mL oral solution 125 mg     125 mg Oral 4 times per day 10/26/14 1655        DVT Prophylaxis: Prophylactic Heparin   Code Status: Full code   Family Communication Spouse at bedside  Procedures:  Perc Drain 3/18  CONSULTS:  ID, GI, general surgery and IR   MEDICATIONS: Scheduled Meds: . sodium chloride   Intravenous Once  . ciprofloxacin  500 mg Oral BID  . feeding supplement (RESOURCE BREEZE)  1 Container Oral TID BM  . heparin  5,000 Units Subcutaneous 3 times per day  . magic mouthwash  10 mL Oral QID  . metroNIDAZOLE  500 mg Oral 3 times per day  . morphine  30 mg Oral Q12H  . pantoprazole  40 mg Oral Daily  . vancomycin  125 mg Oral 4 times per day   Continuous Infusions: . 0.9 % sodium chloride with kcl 20 mL/hr at 11/04/14 2301   PRN Meds:.acetaminophen, LORazepam, methocarbamol, ondansetron (ZOFRAN) IV, oxyCODONE, sodium chloride    PHYSICAL EXAM: Vital signs in last 24 hours: Filed Vitals:   11/06/14 0544 11/06/14 1523 11/06/14 2154 11/07/14 0535  BP: 111/80 123/78 119/84 113/76  Pulse:  113  105  Temp: 98.9 F (37.2 C) 98.7 F (37.1 C) 99.5 F (37.5 C) 98 F (36.7 C)  TempSrc: Oral Oral Oral Oral  Resp: 17  17 16   Height:      Weight:    73.165 kg (161 lb 4.8 oz)  SpO2: 95% 95% 97% 97%    Weight change: 0 kg (0 lb) Filed Weights   11/05/14 0528 11/06/14 0542 11/07/14 0535  Weight: 70.534 kg (155 lb 8 oz) 73.165 kg (161 lb 4.8 oz) 73.165 kg (161 lb 4.8 oz)   Body mass index is 25.26 kg/(m^2).   Gen Exam: Awake and alert with clear speech.   Neck: Supple, No JVD.   Chest: B/L Clear.  No rales or rhonchi CVS: S1 S2 Regular, no murmurs.  Abdomen: soft, BS +, non tender, non distended.  Extremities: no edema, lower extremities warm to touch. Neurologic: Non Focal.   Skin: No Rash.   Wounds: N/A.    Intake/Output from previous day:  Intake/Output Summary (Last 24 hours) at 11/07/14 1139 Last data filed at  11/07/14 0951  Gross per 24 hour  Intake 978.67 ml  Output    950 ml  Net  28.67 ml     LAB RESULTS: CBC  Recent Labs Lab 11/01/14 0534  11/03/14 0516 11/04/14 0545 11/05/14 0455 11/06/14 0455 11/07/14 1032  WBC 9.4  < > 8.9 12.8* 10.5 9.3 9.6  HGB 7.2*  < > 7.0* 9.3* 9.2* 9.1* 9.2*  HCT 22.5*  < > 22.7* 30.3* 29.6* 29.0* 29.4*  PLT 674*  < > 543* 569* 511* 497* 497*  MCV 93.4  < > 94.6 92.4 92.2 91.5 91.9  MCH 29.9  < > 29.2 28.4 28.7 28.7 28.8  MCHC 32.0  < > 30.8 30.7 31.1  31.4 31.3  RDW 16.4*  < > 17.1* 17.9* 17.9* 17.8* 17.5*  LYMPHSABS 2.4  --   --   --   --  2.4  --   MONOABS 0.7  --   --   --   --  1.0  --   EOSABS 0.2  --   --   --   --  0.2  --   BASOSABS 0.0  --   --   --   --  0.1  --   < > = values in this interval not displayed.  Chemistries   Recent Labs Lab 11/01/14 0534 11/02/14 0549 11/03/14 0516 11/04/14 0545 11/06/14 0455  NA 134* 134* 134* 133* 132*  K 3.1* 3.9 3.7 4.4 3.9  CL 99 103 104 101 99  CO2 32 26 25 26 26   GLUCOSE 121* 133* 119* 142* 117*  BUN <5* <5* <5* 7 <5*  CREATININE 0.62 0.54 0.51 0.59 0.59  CALCIUM 7.3* 7.3* 7.1* 8.2* 8.2*  MG 1.8 1.9  --   --  1.6    CBG:  Recent Labs Lab 11/03/14 0608 11/03/14 1155 11/03/14 1738 11/04/14 0009 11/04/14 0519  GLUCAP 140* 135* 113* 139* 153*    GFR Estimated Creatinine Clearance: 112.5 mL/min (by C-G formula based on Cr of 0.59).  Coagulation profile No results for input(s): INR, PROTIME in the last 168 hours.  Cardiac Enzymes No results for input(s): CKMB, TROPONINI, MYOGLOBIN in the last 168 hours.  Invalid input(s): CK  Invalid input(s): POCBNP No results for input(s): DDIMER in the last 72 hours. No results for input(s): HGBA1C in the last 72 hours.  Recent Labs  11/06/14 0500  TRIG 93   No results for input(s): TSH, T4TOTAL, T3FREE, THYROIDAB in the last 72 hours.  Invalid input(s): FREET3 No results for input(s): VITAMINB12, FOLATE, FERRITIN, TIBC, IRON,  RETICCTPCT in the last 72 hours. No results for input(s): LIPASE, AMYLASE in the last 72 hours.  Urine Studies No results for input(s): UHGB, CRYS in the last 72 hours.  Invalid input(s): UACOL, UAPR, USPG, UPH, UTP, UGL, UKET, UBIL, UNIT, UROB, ULEU, UEPI, UWBC, URBC, UBAC, CAST, UCOM, BILUA  MICROBIOLOGY: Recent Results (from the past 240 hour(s))  Clostridium Difficile by PCR     Status: Abnormal   Collection Time: 10/29/14  9:58 AM  Result Value Ref Range Status   C difficile by pcr POSITIVE (A) NEGATIVE Final    Comment: CRITICAL RESULT CALLED TO, READ BACK BY AND VERIFIED WITH: A.FORD,RN 10/30/14 @1420  BY V.WILKINS     RADIOLOGY STUDIES/RESULTS: Ct Abdomen Pelvis W Contrast  11/03/2014   CLINICAL DATA:  44 year old male with continued abdominal pain and low-grade fevers after drain placement. History of Crohn's disease.  EXAM: CT ABDOMEN AND PELVIS WITH CONTRAST  TECHNIQUE: Multidetector CT imaging of the abdomen and pelvis was performed using the standard protocol following bolus administration of intravenous contrast.  CONTRAST:  150mL OMNIPAQUE IOHEXOL 300 MG/ML  SOLN  COMPARISON:  CT the abdomen and pelvis 10/27/2014.  FINDINGS: Lower chest: Small left and trace right-sided pleural effusions lying dependently. Dependent subsegmental atelectasis in the lower lobes of the lungs bilaterally. Central venous catheter tip terminating at the superior cavoatrial junction.  Hepatobiliary: Sub cm low-attenuation lesion in segment 5 of the liver is too small to characterize, but is unchanged compared to prior studies, favored to represent a tiny cyst. No other aggressive appearing cystic or solid hepatic lesions are noted. No intra or extrahepatic biliary ductal dilatation.  Gallbladder is normal in appearance.  Pancreas: Unremarkable.  Spleen: Unremarkable.  Adrenals/Urinary Tract: Bilateral adrenal glands and bilateral kidneys are normal in appearance. No hydroureteronephrosis. Urinary bladder  is normal in appearance.  Stomach/Bowel: Previously placed pigtail drainage catheter is in a collection in the left lower quadrant of the abdomen that still contains some gas and fluid, measuring approximately 4.9 x 2.8 x 3.6 cm (images 70 of series 2, coronal image 34, and sagittal image 82), significantly smaller than the prior examination. This is intimately associated with the superior aspect of the proximal sigmoid colon, and several adjacent mid small bowel loops (presumably jejunal). One of the adjacent small bowel loops appears mildly thickened, best appreciated on image 69 of series 2. Additionally, there is extensive thickening of the colon in the region of the sigmoid colon, most severe in the proximal sigmoid colon, where there is also surrounding inflammatory changes in the adjacent sigmoid mesocolon, and hypervascularity in the sigmoid mesocolon. Dilatation of the more proximal aspects of the proximal to mid small bowel, with bowel loops measuring up to 4.4 cm in diameter, containing multiple air-fluid levels. Distal small bowel is relatively decompressed, but not completely decompressed, and there is gas, fluid and stool throughout the colon. The appearance of the stomach and appendix are normal. Terminal ileum is unremarkable in appearance.  Vascular/Lymphatic: Mild atherosclerosis in the abdominal and pelvic vasculature, without evidence of aneurysm, dissection or major branch occlusion. Numerous reactive size lymph nodes are noted in the sigmoid mesocolon. No definite pathologically enlarged lymph nodes are noted in the abdomen or pelvis.  Reproductive: Prostate gland and seminal vesicles are unremarkable in appearance.  Other: Trace volume of ascites.  No pneumoperitoneum.  Musculoskeletal: There are no aggressive appearing lytic or blastic lesions noted in the visualized portions of the skeleton.  IMPRESSION: 1. Interval placement of a pigtail drainage catheter within a peritoneal abscess in the  left lower quadrant of the abdomen which is intimately associated with adjacent proximal sigmoid colon and adjacent jejunal small bowel loops. While there is some dilatation of the proximal to mid small bowel, with multiple air-fluid levels, this is favored to reflect a reactive ileus rather than a bowel obstruction based on findings on today's examination. The size of the abscess has significantly decreased following placement of the drainage catheter, although there is still residual fluid and gas within the collection. No new abscesses are identified on today's examination. Trace volume of ascites, presumably reactive. 2. Extensive thickening of the sigmoid colon wall, and jejunum, presumably related to areas of activity in this patient with history of Crohn's disease. 3. No pneumoperitoneum. 4. Small left and trace right pleural effusions layering dependently. 5. Additional incidental findings, as above.   Electronically Signed   By: Vinnie Langton M.D.   On: 11/03/2014 17:49   Ct Abdomen Pelvis W Contrast  10/26/2014   CLINICAL DATA:  Generalized abdominal pain. Nausea and vomiting. Fever. C difficile colitis.  EXAM: CT ABDOMEN AND PELVIS WITH CONTRAST  TECHNIQUE: Multidetector CT imaging of the abdomen and pelvis was performed using the standard protocol following bolus administration of intravenous contrast.  CONTRAST:  140mL OMNIPAQUE IOHEXOL 300 MG/ML  SOLN  COMPARISON:  07/11/2014  FINDINGS: Lower Chest:  Unremarkable.  Hepatobiliary: Mild hepatic steatosis. No liver masses are identified. Gallbladder is unremarkable. Tiny sub-cm cyst again seen in the inferior right hepatic lobe.  Pancreas: No mass, inflammatory changes, or other significant abnormality identified.  Spleen:  Within normal limits in size and appearance.  Adrenals:  No masses identified.  Kidneys/Urinary Tract:  No evidence of masses or hydronephrosis.  Stomach/Bowel/Peritoneum: Mild diffuse colonic wall thickening and mucosal  enhancement is again demonstrated, consistent with diffuse colitis. However, there is a more cysts severe area of colonic wall thickening and pericolonic inflammatory change involving the proximal sigmoid colon with a large extraluminal collection containing gas stool and fluid which measures approximately 6.3 x 8.4 cm. This is consistent with a pericolonic abscess. There is no evidence of free fluid or bowel obstruction. No evidence of pneumatosis or portal venous gas.  Vascular/Lymphatic: No pathologically enlarged lymph nodes identified. No other significant abnormality visualized.  Reproductive:  No mass or other significant abnormality identified.  Other:  None.  Musculoskeletal:  No suspicious bone lesions identified.  IMPRESSION: Severe colitis or diverticulitis involving the proximal sigmoid colon, with large pericolonic abscess containing stool measuring approximately 6 x 8 cm. Background  Mild diffuse colitis also noted, without other areas of colonic perforation or abscess.  No evidence of free intraperitoneal air.  These results will be called to the ordering clinician or representative by the Radiologist Assistant, and communication documented in the PACS or zVision Dashboard.   Electronically Signed   By: Earle Gell M.D.   On: 10/26/2014 12:09   Ct Image Guided Drainage By Percutaneous Catheter  10/28/2014   CLINICAL DATA:  44 year old male with Crohn's disease and colonic perforation with large intraperitoneal abscess. CT-guided drain placement is warranted.  EXAM: CT IMAGE GUIDED DRAINAGE BY PERCUTANEOUS CATHETER  Date: 10/28/2014  PROCEDURE: 1. Placement of 70 French drainage catheter under CT guidance Interventional Radiologist:  Criselda Peaches, MD  ANESTHESIA/SEDATION: Moderate (conscious) sedation was used. 2 mg Versed, 1 mcg Fentanyl were administered intravenously. The patient's vital signs were monitored continuously by radiology nursing throughout the procedure.  Sedation Time: 12  minutes  MEDICATIONS: None additional  TECHNIQUE: Informed consent was obtained from the patient following explanation of the procedure, risks, benefits and alternatives. The patient understands, agrees and consents for the procedure. All questions were addressed. A time out was performed.  A planning axial CT scan was performed. The colonic perforation was localized. A suitable skin entry site was selected and marked. The region was then sterilely prepped and draped in standard fashion with Betadine skin prep. Local anesthesia was attained by infiltration with 1% lidocaine. Using intermittent CT fluoroscopic guidance, an 18 gauge trocar needle was advanced into the fluid collection. A 0.035 inch wire was then coiled within the fluid collection in the tract dilated to 14 [place]. A Cook 77 French drainage catheter was then advanced over the wire and formed within the fluid collection.  Aspiration yielded approximately 50 mL of frankly feculent material. A sample was sent for culture. The catheter was then flushed and secured to the skin with 0 Prolene suture. The catheter was connected to JP bulb suction. Post drainage CT imaging demonstrates near-total aspiration of the abscess cavity. There is no evidence of complication.  COMPLICATIONS: None  IMPRESSION: 1. Successful placement of a 14 French drainage catheter into the pericolonic fluid and gas collection. Aspiration the yields 50 mL frankly feculent material.  PLAN: 1. Maintain tube to JP bulb drainage until leukocytosis has normalized and clinical picture has improved. After that, recommend conversion to gravity bag drainage to minimize the risk for fistula formation. 2. Given the feculent nature of the fluid and the underlying Crohn's disease, patient is at very high likelihood of developing a fistulous connection between the catheter and bowel.  Drainage catheter will likely be in place for an extended period. Recommend contrast injection under fluoroscopy  prior to drain removal. 3. Patient to be followed in IR drain clinic at 2 weeks post discharge. Signed,  Criselda Peaches, MD  [facility]  [facility]   Electronically Signed   By: [name] M.D.   On: 10/28/2014 08:47    Reyne Dumas, MD  [facility] Pager:[phone]  If 7PM-7AM, please contact night-coverage [email or web address] Password [facility] 11/07/2014, 11:39 AM   LOS: 12 days

## 2014-11-08 ENCOUNTER — Telehealth (HOSPITAL_COMMUNITY): Payer: Self-pay | Admitting: Interventional Radiology

## 2014-11-08 ENCOUNTER — Other Ambulatory Visit (HOSPITAL_COMMUNITY): Payer: Self-pay | Admitting: Interventional Radiology

## 2014-11-08 DIAGNOSIS — K50114 Crohn's disease of large intestine with abscess: Secondary | ICD-10-CM

## 2014-11-08 LAB — CBC
HCT: 29.6 % — ABNORMAL LOW (ref 39.0–52.0)
Hemoglobin: 9.1 g/dL — ABNORMAL LOW (ref 13.0–17.0)
MCH: 28 pg (ref 26.0–34.0)
MCHC: 30.7 g/dL (ref 30.0–36.0)
MCV: 91.1 fL (ref 78.0–100.0)
PLATELETS: 500 10*3/uL — AB (ref 150–400)
RBC: 3.25 MIL/uL — ABNORMAL LOW (ref 4.22–5.81)
RDW: 17.3 % — ABNORMAL HIGH (ref 11.5–15.5)
WBC: 9.7 10*3/uL (ref 4.0–10.5)

## 2014-11-08 LAB — COMPREHENSIVE METABOLIC PANEL
ALT: <5 U/L (ref 0–53)
AST: 14 U/L (ref 0–37)
Albumin: 1.9 g/dL — ABNORMAL LOW (ref 3.5–5.2)
Alkaline Phosphatase: 55 U/L (ref 39–117)
Anion gap: 3 — ABNORMAL LOW (ref 5–15)
BILIRUBIN TOTAL: 0.4 mg/dL (ref 0.3–1.2)
CHLORIDE: 98 mmol/L (ref 96–112)
CO2: 30 mmol/L (ref 19–32)
CREATININE: 0.72 mg/dL (ref 0.50–1.35)
Calcium: 8 mg/dL — ABNORMAL LOW (ref 8.4–10.5)
GFR calc non Af Amer: >90 mL/min (ref >90)
GLUCOSE: 94 mg/dL (ref 70–99)
Potassium: 4 mmol/L (ref 3.5–5.1)
Sodium: 131 mmol/L — ABNORMAL LOW (ref 135–145)
TOTAL PROTEIN: 6.2 g/dL (ref 6.0–8.3)

## 2014-11-08 MED ORDER — METHOCARBAMOL 500 MG PO TABS: 1000.0000 mg | ORAL_TABLET | Freq: Three times a day (TID) | ORAL | Status: DC | PRN

## 2014-11-08 MED ORDER — VANCOMYCIN 50 MG/ML ORAL SOLUTION: 125.0000 mg | Freq: Four times a day (QID) | ORAL | Status: DC

## 2014-11-08 MED ORDER — METRONIDAZOLE 500 MG PO TABS: 500.0000 mg | ORAL_TABLET | Freq: Three times a day (TID) | ORAL | Status: DC

## 2014-11-08 MED ORDER — PANTOPRAZOLE SODIUM 40 MG PO TBEC: 40.0000 mg | DELAYED_RELEASE_TABLET | Freq: Every day | ORAL | Status: DC

## 2014-11-08 MED ORDER — CIPROFLOXACIN HCL 500 MG PO TABS: 500.0000 mg | ORAL_TABLET | Freq: Two times a day (BID) | ORAL | Status: DC

## 2014-11-08 NOTE — Progress Notes (Signed)
NURSING PROGRESS NOTE  Vincent Black [id number] Discharge Data: 11/08/2014 1:13 PM Attending Provider: Reyne Dumas, MD PCP:No PCP Per Patient     Myna Hidalgo to be D/C'd Home per MD order.  Discussed with the patient the After Visit Summary and all questions fully answered. All IV's discontinued with no bleeding noted. All belongings returned to patient for patient to take home.   Last Vital Signs:  Blood pressure 120/83, pulse 108, temperature 98.5 F (36.9 C), temperature source Oral, resp. rate 16, height 5\' 7"  (1.702 m), weight 69.718 kg (153 lb 11.2 oz), SpO2 98 %.  Discharge Medication List   Medication List    STOP taking these medications        Adalimumab 40 MG/0.8ML Pnkt     azaTHIOprine 50 MG tablet  Commonly known as:  IMURAN     predniSONE 20 MG tablet  Commonly known as:  DELTASONE      TAKE these medications        ALPRAZolam 0.25 MG tablet  Commonly known as:  XANAX  Take 0.25 mg by mouth 3 (three) times daily as needed for anxiety.     ciprofloxacin 500 MG tablet  Commonly known as:  CIPRO  Take 1 tablet (500 mg total) by mouth 2 (two) times daily.     methocarbamol 500 MG tablet  Commonly known as:  ROBAXIN  Take 2 tablets (1,000 mg total) by mouth every 8 (eight) hours as needed for muscle spasms.     metroNIDAZOLE 500 MG tablet  Commonly known as:  FLAGYL  Take 1 tablet (500 mg total) by mouth every 8 (eight) hours.     morphine 30 MG 12 hr tablet  Commonly known as:  MS CONTIN  Take 1 tablet (30 mg total) by mouth every 12 (twelve) hours.     oxyCODONE-acetaminophen 5-325 MG per tablet  Commonly known as:  PERCOCET/ROXICET  Take 1 tablet by mouth every 6 (six) hours as needed for moderate pain or severe pain.     pantoprazole 40 MG tablet  Commonly known as:  PROTONIX  Take 1 tablet (40 mg total) by mouth daily.     PROBIOTIC PO  Take 1 tablet by mouth daily. Chewable     vancomycin 50 mg/mL oral solution  Commonly known as:   VANCOCIN  Take 2.5 mLs (125 mg total) by mouth every 6 (six) hours.

## 2014-11-08 NOTE — Telephone Encounter (Signed)
Called pt to schedule appt for his drain clinic visit next week. No answer, left VM for him to call me back to schedule. JM

## 2014-11-08 NOTE — Discharge Summary (Signed)
Physician Discharge Summary  Vincent Black MRN: [id number] DOB/AGE: July 23, 1971 44 y.o.  PCP: No PCP Per Patient   Admit date: 10/26/2014 Discharge date: 11/08/2014  Discharge Diagnoses:     Principal Problem:   Pericolonic abscess Active Problems:   Crohn's disease   History of Clostridium difficile colitis   Protein-calorie malnutrition, severe   Ankylosing spondylitis   Unintentional weight loss   Normocytic anemia   Odynophagia   Left lower quadrant pain   Abscess of abdominal cavity  Follow-up recommendations Follow-up with PCP in 5-7 days Follow-up with general surgery in one to 2 weeks Patient being discharged with home health for drain care Patient would need to follow-up with interventional radiology and follow-up in the drain clinic Follow-up CBC, BMP in one week     Medication List    STOP taking these medications        Adalimumab 40 MG/0.8ML Pnkt     azaTHIOprine 50 MG tablet  Commonly known as:  IMURAN     predniSONE 20 MG tablet  Commonly known as:  DELTASONE      TAKE these medications        ALPRAZolam 0.25 MG tablet  Commonly known as:  XANAX  Take 0.25 mg by mouth 3 (three) times daily as needed for anxiety.     ciprofloxacin 500 MG tablet  Commonly known as:  CIPRO  Take 1 tablet (500 mg total) by mouth 2 (two) times daily.     methocarbamol 500 MG tablet  Commonly known as:  ROBAXIN  Take 2 tablets (1,000 mg total) by mouth every 8 (eight) hours as needed for muscle spasms.     metroNIDAZOLE 500 MG tablet  Commonly known as:  FLAGYL  Take 1 tablet (500 mg total) by mouth every 8 (eight) hours.     morphine 30 MG 12 hr tablet  Commonly known as:  MS CONTIN  Take 1 tablet (30 mg total) by mouth every 12 (twelve) hours.     oxyCODONE-acetaminophen 5-325 MG per tablet  Commonly known as:  PERCOCET/ROXICET  Take 1 tablet by mouth every 6 (six) hours as needed for moderate pain or severe pain.     pantoprazole 40 MG tablet   Commonly known as:  PROTONIX  Take 1 tablet (40 mg total) by mouth daily.     PROBIOTIC PO  Take 1 tablet by mouth daily. Chewable     vancomycin 50 mg/mL oral solution  Commonly known as:  VANCOCIN  Take 2.5 mLs (125 mg total) by mouth every 6 (six) hours.        Discharge Condition: Stable   Disposition: 01-Home or Self Care   Consults: General surgery Interventional radiology Infectious disease    Significant Diagnostic Studies: Ct Abdomen Pelvis W Contrast  11/03/2014   CLINICAL DATA:  44 year old male with continued abdominal pain and low-grade fevers after drain placement. History of Crohn's disease.  EXAM: CT ABDOMEN AND PELVIS WITH CONTRAST  TECHNIQUE: Multidetector CT imaging of the abdomen and pelvis was performed using the standard protocol following bolus administration of intravenous contrast.  CONTRAST:  113m OMNIPAQUE IOHEXOL 300 MG/ML  SOLN  COMPARISON:  CT the abdomen and pelvis 10/27/2014.  FINDINGS: Lower chest: Small left and trace right-sided pleural effusions lying dependently. Dependent subsegmental atelectasis in the lower lobes of the lungs bilaterally. Central venous catheter tip terminating at the superior cavoatrial junction.  Hepatobiliary: Sub cm low-attenuation lesion in segment 5 of the liver is too small  to characterize, but is unchanged compared to prior studies, favored to represent a tiny cyst. No other aggressive appearing cystic or solid hepatic lesions are noted. No intra or extrahepatic biliary ductal dilatation. Gallbladder is normal in appearance.  Pancreas: Unremarkable.  Spleen: Unremarkable.  Adrenals/Urinary Tract: Bilateral adrenal glands and bilateral kidneys are normal in appearance. No hydroureteronephrosis. Urinary bladder is normal in appearance.  Stomach/Bowel: Previously placed pigtail drainage catheter is in a collection in the left lower quadrant of the abdomen that still contains some gas and fluid, measuring approximately 4.9 x  2.8 x 3.6 cm (images 70 of series 2, coronal image 34, and sagittal image 82), significantly smaller than the prior examination. This is intimately associated with the superior aspect of the proximal sigmoid colon, and several adjacent mid small bowel loops (presumably jejunal). One of the adjacent small bowel loops appears mildly thickened, best appreciated on image 69 of series 2. Additionally, there is extensive thickening of the colon in the region of the sigmoid colon, most severe in the proximal sigmoid colon, where there is also surrounding inflammatory changes in the adjacent sigmoid mesocolon, and hypervascularity in the sigmoid mesocolon. Dilatation of the more proximal aspects of the proximal to mid small bowel, with bowel loops measuring up to 4.4 cm in diameter, containing multiple air-fluid levels. Distal small bowel is relatively decompressed, but not completely decompressed, and there is gas, fluid and stool throughout the colon. The appearance of the stomach and appendix are normal. Terminal ileum is unremarkable in appearance.  Vascular/Lymphatic: Mild atherosclerosis in the abdominal and pelvic vasculature, without evidence of aneurysm, dissection or major branch occlusion. Numerous reactive size lymph nodes are noted in the sigmoid mesocolon. No definite pathologically enlarged lymph nodes are noted in the abdomen or pelvis.  Reproductive: Prostate gland and seminal vesicles are unremarkable in appearance.  Other: Trace volume of ascites.  No pneumoperitoneum.  Musculoskeletal: There are no aggressive appearing lytic or blastic lesions noted in the visualized portions of the skeleton.  IMPRESSION: 1. Interval placement of a pigtail drainage catheter within a peritoneal abscess in the left lower quadrant of the abdomen which is intimately associated with adjacent proximal sigmoid colon and adjacent jejunal small bowel loops. While there is some dilatation of the proximal to mid small bowel, with  multiple air-fluid levels, this is favored to reflect a reactive ileus rather than a bowel obstruction based on findings on today's examination. The size of the abscess has significantly decreased following placement of the drainage catheter, although there is still residual fluid and gas within the collection. No new abscesses are identified on today's examination. Trace volume of ascites, presumably reactive. 2. Extensive thickening of the sigmoid colon wall, and jejunum, presumably related to areas of activity in this patient with history of Crohn's disease. 3. No pneumoperitoneum. 4. Small left and trace right pleural effusions layering dependently. 5. Additional incidental findings, as above.   Electronically Signed   By: Vinnie Langton M.D.   On: 11/03/2014 17:49   Ct Abdomen Pelvis W Contrast  10/26/2014   CLINICAL DATA:  Generalized abdominal pain. Nausea and vomiting. Fever. C difficile colitis.  EXAM: CT ABDOMEN AND PELVIS WITH CONTRAST  TECHNIQUE: Multidetector CT imaging of the abdomen and pelvis was performed using the standard protocol following bolus administration of intravenous contrast.  CONTRAST:  145m OMNIPAQUE IOHEXOL 300 MG/ML  SOLN  COMPARISON:  07/11/2014  FINDINGS: Lower Chest:  Unremarkable.  Hepatobiliary: Mild hepatic steatosis. No liver masses are identified. Gallbladder is unremarkable.  Tiny sub-cm cyst again seen in the inferior right hepatic lobe.  Pancreas: No mass, inflammatory changes, or other significant abnormality identified.  Spleen:  Within normal limits in size and appearance.  Adrenals:  No masses identified.  Kidneys/Urinary Tract:  No evidence of masses or hydronephrosis.  Stomach/Bowel/Peritoneum: Mild diffuse colonic wall thickening and mucosal enhancement is again demonstrated, consistent with diffuse colitis. However, there is a more cysts severe area of colonic wall thickening and pericolonic inflammatory change involving the proximal sigmoid colon with a large  extraluminal collection containing gas stool and fluid which measures approximately 6.3 x 8.4 cm. This is consistent with a pericolonic abscess. There is no evidence of free fluid or bowel obstruction. No evidence of pneumatosis or portal venous gas.  Vascular/Lymphatic: No pathologically enlarged lymph nodes identified. No other significant abnormality visualized.  Reproductive:  No mass or other significant abnormality identified.  Other:  None.  Musculoskeletal:  No suspicious bone lesions identified.  IMPRESSION: Severe colitis or diverticulitis involving the proximal sigmoid colon, with large pericolonic abscess containing stool measuring approximately 6 x 8 cm. Background  Mild diffuse colitis also noted, without other areas of colonic perforation or abscess.  No evidence of free intraperitoneal air.  These results will be called to the ordering clinician or representative by the Radiologist Assistant, and communication documented in the PACS or zVision Dashboard.   Electronically Signed   By: Earle Gell M.D.   On: 10/26/2014 12:09   Ct Image Guided Drainage By Percutaneous Catheter  10/28/2014   CLINICAL DATA:  44 year old male with Crohn's disease and colonic perforation with large intraperitoneal abscess. CT-guided drain placement is warranted.  EXAM: CT IMAGE GUIDED DRAINAGE BY PERCUTANEOUS CATHETER  Date: 10/28/2014  PROCEDURE: 1. Placement of 77 French drainage catheter under CT guidance Interventional Radiologist:  Criselda Peaches, MD  ANESTHESIA/SEDATION: Moderate (conscious) sedation was used. 2 mg Versed, 1 mcg Fentanyl were administered intravenously. The patient's vital signs were monitored continuously by radiology nursing throughout the procedure.  Sedation Time: 12 minutes  MEDICATIONS: None additional  TECHNIQUE: Informed consent was obtained from the patient following explanation of the procedure, risks, benefits and alternatives. The patient understands, agrees and consents for the  procedure. All questions were addressed. A time out was performed.  A planning axial CT scan was performed. The colonic perforation was localized. A suitable skin entry site was selected and marked. The region was then sterilely prepped and draped in standard fashion with Betadine skin prep. Local anesthesia was attained by infiltration with 1% lidocaine. Using intermittent CT fluoroscopic guidance, an 18 gauge trocar needle was advanced into the fluid collection. A 0.035 inch wire was then coiled within the fluid collection in the tract dilated to 14 [place]. A Cook 36 French drainage catheter was then advanced over the wire and formed within the fluid collection.  Aspiration yielded approximately 50 mL of frankly feculent material. A sample was sent for culture. The catheter was then flushed and secured to the skin with 0 Prolene suture. The catheter was connected to JP bulb suction. Post drainage CT imaging demonstrates near-total aspiration of the abscess cavity. There is no evidence of complication.  COMPLICATIONS: None  IMPRESSION: 1. Successful placement of a 14 French drainage catheter into the pericolonic fluid and gas collection. Aspiration the yields 50 mL frankly feculent material.  PLAN: 1. Maintain tube to JP bulb drainage until leukocytosis has normalized and clinical picture has improved. After that, recommend conversion to gravity bag drainage to minimize the  risk for fistula formation. 2. Given the feculent nature of the fluid and the underlying Crohn's disease, patient is at very high likelihood of developing a fistulous connection between the catheter and bowel. Drainage catheter will likely be in place for an extended period. Recommend contrast injection under fluoroscopy prior to drain removal. 3. Patient to be followed in IR drain clinic at 2 weeks post discharge. Signed,  Criselda Peaches, MD  [facility]  [facility]   Electronically Signed    By: [name] M.D.   On: 10/28/2014 08:47      Microbiology: No results found for this or any previous visit (from the past 240 hour(s)).   Labs: Results for orders placed or performed during the hospital encounter of 10/26/14 (from the past 48 hour(s))  CBC     Status: Abnormal   Collection Time: 11/07/14 10:32 AM  Result Value Ref Range   WBC 9.6 4.0 - 10.5 K/uL   RBC 3.20 (L) 4.22 - 5.81 MIL/uL   Hemoglobin 9.2 (L) 13.0 - 17.0 g/dL   HCT 29.4 (L) 39.0 - 52.0 %   MCV 91.9 78.0 - 100.0 fL   MCH 28.8 26.0 - 34.0 pg   MCHC 31.3 30.0 - 36.0 g/dL   RDW 17.5 (H) 11.5 - 15.5 %   Platelets 497 (H) 150 - 400 K/uL  CBC     Status: Abnormal   Collection Time: 11/08/14  5:45 AM  Result Value Ref Range   WBC 9.7 4.0 - 10.5 K/uL   RBC 3.25 (L) 4.22 - 5.81 MIL/uL   Hemoglobin 9.1 (L) 13.0 - 17.0 g/dL   HCT 29.6 (L) 39.0 - 52.0 %   MCV 91.1 78.0 - 100.0 fL   MCH 28.0 26.0 - 34.0 pg   MCHC 30.7 30.0 - 36.0 g/dL   RDW 17.3 (H) 11.5 - 15.5 %   Platelets 500 (H) 150 - 400 K/uL  Comprehensive metabolic panel     Status: Abnormal   Collection Time: 11/08/14  5:45 AM  Result Value Ref Range   Sodium 131 (L) 135 - 145 mmol/L   Potassium 4.0 3.5 - 5.1 mmol/L   Chloride 98 96 - 112 mmol/L   CO2 30 19 - 32 mmol/L   Glucose, Bld 94 70 - 99 mg/dL   BUN <5 (L) 6 - 23 mg/dL   Creatinine, Ser 0.72 0.50 - 1.35 mg/dL   Calcium 8.0 (L) 8.4 - 10.5 mg/dL   Total Protein 6.2 6.0 - 8.3 g/dL   Albumin 1.9 (L) 3.5 - 5.2 g/dL   AST 14 0 - 37 U/L   ALT <5 0 - 53 U/L   Alkaline Phosphatase 55 39 - 117 U/L   Total Bilirubin 0.4 0.3 - 1.2 mg/dL   GFR calc non Af Amer >90 >90 mL/min   GFR calc Af Amer >90 >90 mL/min    Comment: (NOTE) The eGFR has been calculated using the CKD EPI equation. This calculation has not been validated in all clinical situations. eGFR's persistently <90 mL/min signify possible Chronic Kidney Disease.    Anion gap 3 (L) 5 - 15     HPI :44 year old male with history  of Crohn's disease, recently discharged from this hospital on 10/10/14 after being treated for C. difficile colitis. Patient presented to the hospital on 3/17 with abdominal pain. CT of the abdomen showed sigmoid abscess. Patient was then admitted for further evaluation and treatment. Patient was started on empiric antibiotics,  kept nothing by mouth, gastroenterology, surgery,interventional radiology was consulted. She underwent IR drain placement on 3/18. Infectious disease directed antibiotic regimen. Has been started on TNA. Repeat CT of the abdomen on 3/25 showed significant improvement  HOSPITAL COURSE:   Sepsis:sepsis pathophysiology has resolved. Secondary to intra-abdominal abscess.  Patient initially treated with broad-spectrum antibiotics Narrowed down to ciprofloxacin and Flagyl Patient will continue these antibiotics until his drain was removed  CXs show Citrobacter freundii & Klebsiella oxytoca    Pericolonic abscess: underwent pec drain placement on 3/18, cultures positive for Citrobacter and Klebsiella. ID consulted and directing Abx. Was on Rocephin-switched to Cipro by CCS. IR, GI, ID and CCS consulted on the patient during this admission  Repeat CT Abd on 3/25 shows significant improvement.Pig tail drain in place.Diet has now been advanced to soft on 3/28. TPN was d/c on 11/06/14.. Surgery recommended that they would try to avoid surgery in this patient right now as much as possible unless he deteriorated -Pain well controlled with oral Oxy IR, takes 22m BID MS Contin home dose -ambulating better -no immunosuppressive therapy until abscess and fistula have healed. Patient progressed well with oral meds, no fevers, WBC normal for 3 days, and improving nutritional intake  C Diff Colitis:On empiric oral Vanco with taper and Cipro and Flagyl    Crohn's disease: complicated by development of possible fistulous connection between abscess and colon. GI/CCS following. Abx as above.  C   Protein-calorie malnutrition, severe: Improving   Hyponatremia: mild, monitor for now, repeat BMP in 3-5 days   Chronic Odynophagia:completed a course of fluconazole.   Chronic Pain Syndrome:MS Contin resumed, follow and adjust dosing.   Anemia: secondary to acute on chronic illness, transfused PRBC on 3/25. Stable CBC    Discharge Exam:   Blood pressure 120/83, pulse 108, temperature 98.5 F (36.9 C), temperature source Oral, resp. rate 16, height 5' 7"  (1.702 m), weight 69.718 kg (153 lb 11.2 oz), SpO2 98 %. Gen Exam: Awake and alert with clear speech.  Neck: Supple, No JVD.  Chest: B/L Clear. No rales or rhonchi CVS: S1 S2 Regular, no murmurs.  Abdomen: soft, BS +, non tender, non distended.  Extremities: no edema, lower extremities warm to touch. Neurologic: Non Focal.           Follow-up Information    Follow up with PCP. Schedule an appointment as soon as possible for a visit in 3 days.      Follow up with IAdin Hector MD. Schedule an appointment as soon as possible for a visit in 1 week.   Specialty:  General Surgery   Contact information:   [address] [place] [phone]      Follow up with [facility]   Specialty:  Home Health Services   Why:  They will contact you to schedule home nurse visit.   Contact information:   [address] [place] [phone]      Signed: AReyne Dumas3/30/2016, 11:04 AM

## 2014-11-08 NOTE — Progress Notes (Signed)
    Pt being discharged to home today  Rec: flush drain at home using 5 cc sterile saline 1-2x/day Record output daily Keep site clean and dry  Pt will hear from IR scheduler for follow drain clinic time and date

## 2014-11-08 NOTE — Progress Notes (Signed)
Subjective: Continues to improve and feel better. Tolerating soft diet. Says he's having 1 bowel movement per day. Still has enteric drainage from drain but volume is very low.  We had a long talk about his situation including his Crohn's colitis, C. difficile colitis, and controlled fistula. We talked about the role of surgery in the future. I told him that there was an increased chance that he may require surgery in the future but hopefully this can be done as an elective process after careful consideration. We discussed possible second opinion from a university surgeon specializing in inflammatory bowel disease.    Objective: Vital signs in last 24 hours: Temp:  [99.1 F (37.3 C)-99.6 F (37.6 C)] 99.6 F (37.6 C) (03/29 2200) Pulse Rate:  [111-113] 111 (03/29 2200) Resp:  [15-18] 15 (03/29 2200) BP: (117-120)/(83-84) 120/83 mmHg (03/29 2200) SpO2:  [99 %] 99 % (03/29 2200) Last BM Date: 11/07/14  Intake/Output from previous day: 03/29 0701 - 03/30 0700 In: 1051.3 [P.O.:600; I.V.:441.3] Out: 967.5 [Urine:950; Drains:17.5] Intake/Output this shift: Total I/O In: 686.3 [P.O.:240; I.V.:441.3; Other:5] Out: 757.5 [Urine:750; Drains:7.5]  General appearance: Alert and cooperative. Looks much better. Resp: clear to auscultation bilaterally GI: Soft. Still a little tender left lower quadrant below drain site. Drain site clean. Minimal enteric drainage in suction bulb.  Lab Results:   Recent Labs  11/06/14 0455 11/07/14 1032  WBC 9.3 9.6  HGB 9.1* 9.2*  HCT 29.0* 29.4*  PLT 497* 497*   BMET  Recent Labs  11/06/14 0455  NA 132*  K 3.9  CL 99  CO2 26  GLUCOSE 117*  BUN <5*  CREATININE 0.59  CALCIUM 8.2*   PT/INR No results for input(s): LABPROT, INR in the last 72 hours. ABG No results for input(s): PHART, HCO3 in the last 72 hours.  Invalid input(s): PCO2, PO2  Studies/Results: No results found.  Anti-infectives: Anti-infectives    Start      Dose/Rate Route Frequency Ordered Stop   11/06/14 1100  ciprofloxacin (CIPRO) tablet 500 mg     500 mg Oral 2 times daily 11/06/14 0941     11/06/14 0945  metroNIDAZOLE (FLAGYL) tablet 500 mg     500 mg Oral 3 times per day 11/06/14 0941     10/31/14 2000  cefTRIAXone (ROCEPHIN) 1 g in dextrose 5 % 50 mL IVPB - Premix  Status:  Discontinued     1 g 100 mL/hr over 30 Minutes Intravenous Every 24 hours 10/31/14 1855 11/06/14 0941   10/31/14 2000  metroNIDAZOLE (FLAGYL) IVPB 500 mg  Status:  Discontinued     500 mg 100 mL/hr over 60 Minutes Intravenous Every 8 hours 10/31/14 1855 11/06/14 0941   10/28/14 1200  piperacillin-tazobactam (ZOSYN) IVPB 3.375 g  Status:  Discontinued     3.375 g 12.5 mL/hr over 240 Minutes Intravenous Every 8 hours 10/28/14 1021 10/31/14 1855   10/26/14 2200  metroNIDAZOLE (FLAGYL) IVPB 500 mg  Status:  Discontinued     500 mg 100 mL/hr over 60 Minutes Intravenous Every 8 hours 10/26/14 2015 10/28/14 0943   10/26/14 2100  ciprofloxacin (CIPRO) IVPB 400 mg  Status:  Discontinued     400 mg 200 mL/hr over 60 Minutes Intravenous Every 12 hours 10/26/14 2027 10/28/14 1021   10/26/14 1900  fluconazole (DIFLUCAN) IVPB 100 mg  Status:  Discontinued     100 mg 50 mL/hr over 60 Minutes Intravenous Every 24 hours 10/26/14 1805 11/03/14 1251   10/26/14 1800  vancomycin (  VANCOCIN) 50 mg/mL oral solution 125 mg     125 mg Oral 4 times per day 10/26/14 1655        Assessment/Plan:  HD #13 Intra-abdominal abscess with crohn's disease -On oral cipro flagyl, along with oral vancomycin.no further low grade fevers. Patient actually looks better today. -IR placed a perc drain 10/27/14. 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 Discussed this with his wife and father. Cont [employer] for additional  nutritional support. Prealbumin 11.0.Marland Kitchena little better CT shows improvement in abscess -WBC stable Continue to monitor with conservative management.  -Would try to avoid surgery in this patient right now as much as possible unless he deteriorated -Pain well controlled with oral Oxy IR, takes 30mg  BID MS Contin home dose -ambulating better -no immunosuppressive therapy until abscess and fistula have healed. -hopefully able to dc home soon, next 24-72 hours, depending on how well he does with oral meds, fevers, WBC, and nutritional intake Would consider outpatient surgical consultation, possibly at [facility]   Hypoalbuminemia/low prealbumin - TPN -Albumin is 1.8.   Recurrent C.diff - stool still positive 10/29/14 -Oral Vanc    LOS: 13 days    Vincent Black M 11/08/2014

## 2014-11-08 NOTE — Progress Notes (Signed)
CARE MANAGEMENT NOTE 11/08/2014  Patient:  Vincent Black, Vincent Black   Account Number:  [id number]  Date Initiated:  11/08/2014  Documentation initiated by:  [facility]  Subjective/Objective Assessment:   pericolonic abscess     Action/Plan:   plan [facility] for drain care   Anticipated DC Date:  11/08/2014   Anticipated DC Plan:  [place]  CM consult      [facility] Choice  HOME HEALTH   Choice offered to / List presented to:  C-1 Patient        [place] arranged  HH-1 RN      [facility] agency  [place]   Status of service:  Completed, signed off Medicare Important Message given?   (If response is "NO", the following Medicare IM given date fields will be blank) Date Medicare IM given:   Medicare IM given by:   Date Additional Medicare IM given:   Additional Medicare IM given by:    Discharge Disposition:  [place]  Per UR Regulation:  Reviewed for med. necessity/level of care/duration of stay  If discussed at [place] of Stay Meetings, dates discussed:    Comments:  11/08/14 Spoke with patient and girlfriend about [place].They selected [facility]. Patient stated  that he and his girlfriend have been taught how to care for drain.Girlfriend stated that she will be bale to assist him 24/7 after d/c. Contacted Edwina at [place] and set up [facility].

## 2014-11-14 ENCOUNTER — Encounter: Payer: Self-pay | Admitting: *Deleted

## 2014-11-14 ENCOUNTER — Telehealth: Payer: Self-pay | Admitting: *Deleted

## 2014-11-14 ENCOUNTER — Other Ambulatory Visit: Payer: Self-pay | Admitting: Internal Medicine

## 2014-11-14 DIAGNOSIS — B3781 Candidal esophagitis: Secondary | ICD-10-CM | POA: Insufficient documentation

## 2014-11-14 MED ORDER — FLUCONAZOLE 100 MG PO TABS: 100.0000 mg | ORAL_TABLET | Freq: Every day | ORAL | Status: DC

## 2014-11-14 NOTE — Telephone Encounter (Signed)
Patient approved for 1 month only of fluconazole (no quantity limit).  [place] [id number].  However, current rx written for Fluconazole 100mg  #14 with 1 refill.  Please advise if appropriate to send prescription with #28 pills, as the patient will not be able to use the refill. Landis Gandy, RN

## 2014-11-15 ENCOUNTER — Encounter: Payer: Self-pay | Admitting: Internal Medicine

## 2014-11-15 ENCOUNTER — Other Ambulatory Visit: Payer: Self-pay | Admitting: *Deleted

## 2014-11-15 ENCOUNTER — Ambulatory Visit (INDEPENDENT_AMBULATORY_CARE_PROVIDER_SITE_OTHER): Payer: 59 | Admitting: Internal Medicine

## 2014-11-15 ENCOUNTER — Telehealth: Payer: Self-pay | Admitting: *Deleted

## 2014-11-15 DIAGNOSIS — K630 Abscess of intestine: Secondary | ICD-10-CM

## 2014-11-15 MED ORDER — PROMETHAZINE HCL 6.25 MG/5ML PO SYRP: ORAL_SOLUTION | ORAL | Status: DC

## 2014-11-15 MED ORDER — METRONIDAZOLE 500 MG PO TABS: 500.0000 mg | ORAL_TABLET | Freq: Three times a day (TID) | ORAL | Status: DC

## 2014-11-15 NOTE — Progress Notes (Signed)
Patient ID: Vincent Black, male   DOB: 21-Jan-1971, 44 y.o.   MRN: [id number]         [facility]  Patient Active Problem List   Diagnosis Date Noted  . Pericolonic abscess 10/27/2014    Priority: High  . History of Clostridium difficile colitis 09/30/2014    Priority: High  . Odynophagia 10/27/2014    Priority: Medium  . Candidiasis, esophageal 11/14/2014  . Abscess of abdominal cavity   . Left lower quadrant pain   . Intra-abdominal abscess 10/26/2014  . Hyponatremia 10/26/2014  . Oral thrush 10/26/2014  . Unintentional weight loss 10/08/2014  . Normocytic anemia 10/08/2014  . Thrombocytosis 10/08/2014  . Hypokalemia 10/01/2014  . Protein-calorie malnutrition, severe 09/30/2014  . Ankylosing spondylitis 09/30/2014  . Crohn's disease 09/28/2014    Patient's Medications  New Prescriptions   No medications on file  Previous Medications   ALPRAZOLAM (XANAX) 0.25 MG TABLET    Take 0.25 mg by mouth 3 (three) times daily as needed for anxiety.   CIPROFLOXACIN (CIPRO) 500 MG TABLET    Take 1 tablet (500 mg total) by mouth 2 (two) times daily.   FLUCONAZOLE (DIFLUCAN) 100 MG TABLET    Take 1 tablet (100 mg total) by mouth daily.   METHOCARBAMOL (ROBAXIN) 500 MG TABLET    Take 2 tablets (1,000 mg total) by mouth every 8 (eight) hours as needed for muscle spasms.   METRONIDAZOLE (FLAGYL) 500 MG TABLET    Take 1 tablet (500 mg total) by mouth every 8 (eight) hours.   MORPHINE (MS CONTIN) 30 MG 12 HR TABLET    Take 1 tablet (30 mg total) by mouth every 12 (twelve) hours.   OXYCODONE-ACETAMINOPHEN (PERCOCET/ROXICET) 5-325 MG PER TABLET    Take 1 tablet by mouth every 6 (six) hours as needed for moderate pain or severe pain.   PANTOPRAZOLE (PROTONIX) 40 MG TABLET    Take 1 tablet (40 mg total) by mouth daily.   PROBIOTIC PRODUCT (PROBIOTIC PO)    Take 1 tablet by mouth daily. Chewable   VANCOMYCIN (VANCOCIN) 50 MG/ML ORAL SOLUTION    Take 2.5 mLs (125 mg  total) by mouth every 6 (six) hours.  Modified Medications   No medications on file  Discontinued Medications   No medications on file    Subjective: Vincent Black is in for his hospital follow-up visit with his wife and father. He has Crohn's disease which has been felt to be in remission but he developed C. difficile colitis several months ago leading to hospitalization. He eventually responded to oral vancomycin. He then developed severe left lower quadrant pain leading to readmission. He was found to have a pelvic abscess that was presumed to be due to sigmoid diverticulitis. He was treated with IV ceftriaxone and metronidazole. He had a relapse of his C. difficile colitis and was put back on oral vancomycin. A drain was placed and draining cultures grew Citrobacter and Klebsiella. He was discharged on oral ciprofloxacin, metronidazole and vancomycin. He is now completed 21 days of total antibiotic therapy.  Unfortunately, he is feeling worse since discharge. He is having more sharp shooting pain across his lower abdomen. He states that it comes in waves and is almost constant. It gets worse if he tries to eat. His appetite remains poor. He states that everything tastes awful. He is also having a great deal of difficulty tolerating metronidazole. He states the tablet is very large and tastes very better. It  causes him to have nausea and vomiting and he has stopped taking it. He is currently only on ciprofloxacin and vancomycin. He has been trying to take Zofran for his nausea but he states that it actually makes his nausea worse.  When he was in the hospital he was treated with fluconazole for dysphagia and odynophagia. He states that both improved but have started to come back since he left the hospital. I called in a prescription for fluconazole yesterday.  Review of Systems: Constitutional: positive for anorexia, fatigue, malaise and weight loss, negative for chills, fevers and sweats Eyes:  negative Ears, nose, mouth, throat, and face: positive for sore mouth and mild odynophagia and dysphagia, negative for nasal congestion and sore throat Respiratory: negative Cardiovascular: negative Gastrointestinal: positive for abdominal pain, dysphagia, nausea, odynophagia and vomiting, negative for diarrhea Genitourinary:negative  Past Medical History  Diagnosis Date  . Crohn disease   . C. difficile colitis 09/30/2014  . Ankylosing spondylitis 09/30/2014  . Anxiety     History  Substance Use Topics  . Smoking status: Former Smoker -- 0.50 packs/day for 10 years    Types: Cigarettes  . Smokeless tobacco: Never Used     Comment: 'quit smoking in ~ 2014"  . Alcohol Use: Yes     Comment: 10/25/2014 "might have a drink a couple times/yr"    No family history on file.  No Known Allergies  Objective: Temp: 98.2 F (36.8 C) (04/06 0903) Temp Source: Oral (04/06 0903) BP: 137/97 mmHg (04/06 0903) Pulse Rate: 142 (04/06 0903)  General: He is thin and looks extremely tired. He is in a wheelchair. Skin: No rash Oral: Tongue and buccal mucosa erythematous without other lesions Lungs: Clear Cor: Tachycardic but regular S1 and S2 with no murmur Abdomen: Left lower quadrant drain in place. Feculent material in drain. They state that his drain output has been 15-20 mL daily. Mild lower quadrant tenderness with palpation  Lab Results    Assessment: He is doing worse. Some of that is due to intolerance of his metronidazole and worsening nausea. I reviewed the situation with Onnie Boer, our infectious disease pharmacist, today. We will have [facility] liquid metronidazole with sweetener to see if he can tolerate it. He is also set up for a repeat CT scan on 11/17/2014. I will have and try liquid promethazine for his nausea.  Plan: 1. Continue oral ciprofloxacin and vancomycin 2. Change metronidazole to liquid form with sweetener 3. Change Zofran to liquid  promethazine 4. Repeat abdominal and pelvic CT scan on 11/17/2014 5. Follow-up here on 11/21/2014   Michel Bickers, MD [place] [facility] [phone] pager   [phone] cell 11/15/2014, 9:30 AM

## 2014-11-15 NOTE — Telephone Encounter (Signed)
Error

## 2014-11-15 NOTE — Telephone Encounter (Signed)
change phenergan to [facility] on [address], [address]

## 2014-11-16 ENCOUNTER — Inpatient Hospital Stay: Payer: 59 | Admitting: Internal Medicine

## 2014-11-17 ENCOUNTER — Ambulatory Visit (HOSPITAL_COMMUNITY)
Admission: RE | Admit: 2014-11-17 | Discharge: 2014-11-17 | Disposition: A | Discharge status: Home / Self Care | Payer: 59 | Source: Ambulatory Visit | Attending: Interventional Radiology | Admitting: Interventional Radiology

## 2014-11-17 ENCOUNTER — Other Ambulatory Visit (HOSPITAL_COMMUNITY): Payer: Self-pay | Admitting: Interventional Radiology

## 2014-11-17 ENCOUNTER — Other Ambulatory Visit: Payer: Self-pay | Admitting: Interventional Radiology

## 2014-11-17 ENCOUNTER — Encounter (HOSPITAL_COMMUNITY): Payer: Self-pay

## 2014-11-17 DIAGNOSIS — L0291 Cutaneous abscess, unspecified: Secondary | ICD-10-CM

## 2014-11-17 DIAGNOSIS — K50114 Crohn's disease of large intestine with abscess: Secondary | ICD-10-CM | POA: Insufficient documentation

## 2014-11-17 MED ORDER — IOHEXOL 300 MG/ML  SOLN
100.0000 mL | Freq: Once | INTRAMUSCULAR | Status: AC | PRN
  Administered 2014-11-17: 100 mL via INTRAVENOUS

## 2014-11-17 MED ORDER — IOHEXOL 300 MG/ML  SOLN
50.0000 mL | Freq: Once | INTRAMUSCULAR | Status: AC | PRN
  Administered 2014-11-17: 50 mL via INTRAVENOUS

## 2014-11-17 NOTE — Procedures (Signed)
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

## 2014-11-21 ENCOUNTER — Encounter: Payer: Self-pay | Admitting: Internal Medicine

## 2014-11-21 ENCOUNTER — Ambulatory Visit (INDEPENDENT_AMBULATORY_CARE_PROVIDER_SITE_OTHER): Payer: 59 | Admitting: Internal Medicine

## 2014-11-21 VITALS — BP 124/95 | HR 138 | Temp 98.5°F | Wt 145.0 lb

## 2014-11-21 DIAGNOSIS — K630 Abscess of intestine: Secondary | ICD-10-CM | POA: Diagnosis not present

## 2014-11-21 NOTE — Progress Notes (Addendum)
Patient ID: Vincent Black, male   DOB: 03-04-1971, 44 y.o.   MRN: [id number]         [facility] for [facility]  Patient Active Problem List   Diagnosis Date Noted  . Pericolonic abscess 10/27/2014    Priority: High  . History of Clostridium difficile colitis 09/30/2014    Priority: High  . Odynophagia 10/27/2014    Priority: Medium  . Candidiasis, esophageal 11/14/2014  . Hyponatremia 10/26/2014  . Oral thrush 10/26/2014  . Unintentional weight loss 10/08/2014  . Normocytic anemia 10/08/2014  . Thrombocytosis 10/08/2014  . Hypokalemia 10/01/2014  . Protein-calorie malnutrition, severe 09/30/2014  . Ankylosing spondylitis 09/30/2014  . Crohn's disease 09/28/2014    Patient's Medications  New Prescriptions   No medications on file  Previous Medications   ALPRAZOLAM (XANAX) 0.25 MG TABLET    Take 0.25 mg by mouth 3 (three) times daily as needed for anxiety.   CIPROFLOXACIN (CIPRO) 500 MG TABLET    Take 1 tablet (500 mg total) by mouth 2 (two) times daily.   FLUCONAZOLE (DIFLUCAN) 100 MG TABLET    Take 1 tablet (100 mg total) by mouth daily.   METHOCARBAMOL (ROBAXIN) 500 MG TABLET    Take 2 tablets (1,000 mg total) by mouth every 8 (eight) hours as needed for muscle spasms.   METRONIDAZOLE (FLAGYL) 500 MG TABLET    Take 1 tablet (500 mg total) by mouth every 8 (eight) hours.   MORPHINE (MS CONTIN) 30 MG 12 HR TABLET    Take 1 tablet (30 mg total) by mouth every 12 (twelve) hours.   OXYCODONE-ACETAMINOPHEN (PERCOCET/ROXICET) 5-325 MG PER TABLET    Take 1 tablet by mouth every 6 (six) hours as needed for moderate pain or severe pain.   PANTOPRAZOLE (PROTONIX) 40 MG TABLET    Take 1 tablet (40 mg total) by mouth daily.   PROBIOTIC PRODUCT (PROBIOTIC PO)    Take 1 tablet by mouth daily. Chewable   PROMETHAZINE (PHENERGAN) 6.25 MG/5ML SYRUP    Take 12.5 to 25 mg every 6 hours as needed for nausea.   VANCOMYCIN (VANCOCIN) 50 MG/ML ORAL SOLUTION    Take 2.5 mLs (125 mg  total) by mouth every 6 (six) hours.  Modified Medications   No medications on file  Discontinued Medications   No medications on file    Subjective: [name] is in for his routine follow-up visit for his intra-abdominal abscess and C. difficile colitis. He is feeling much better over the past week. He restarted the metronidazole tablet and has tolerated it well as long as he takes Phenergan with it. He is also still taking his oral ciprofloxacin and vancomycin. He is not having any diarrhea, fever or chills. His abdominal pain has improved. His appetite has improved and he is eating well.  He had a follow-up CT scan and drain study on 11/17/2014. The abscess cavity was collapsed. 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 The suction drain bulb was changed to a gravity bag. He's had relatively little drain output since the study.  Review of Systems: Pertinent items are noted in HPI.  Past Medical History  Diagnosis Date  . Crohn disease   . C. difficile colitis 09/30/2014  . Ankylosing spondylitis 09/30/2014  . Anxiety     History  Substance Use Topics  . Smoking status: Former Smoker -- 0.50 packs/day for 10 years    Types: Cigarettes  . Smokeless tobacco: Never Used  Comment: 'quit smoking in ~ 2014"  . Alcohol Use: Yes     Comment: 10/25/2014 "might have a drink a couple times/yr"    No family history on file.  No Known Allergies  Objective: Temp: 98.5 F (36.9 C) (04/12 0956) Temp Source: Oral (04/12 0956) BP: 124/95 mmHg (04/12 0956) Pulse Rate: 138 (04/12 0956)  His weight is 145 pounds, down 19 over the past 2 months  General: He looks much better today. His color is better and he is smiling and in better spirits. Lungs: Clear Cor: Regular S1 and S2 with no murmurs Abdomen: Soft and nontender. About 10 mL of feculent material in drain bag  FLUOROSCOPIC INJECTION OF THE EXISTING LEFT LOWER QUADRANT ABSCESS DRAIN  Date: 4/8/20164/03/2015 12:38  pm  UNDER STERILE CONDITIONS, THE EXISTING DRAIN CATHETER WAS INJECTED WITH CONTRAST UNDER FLUOROSCOPY. IMAGES OBTAINED FOR DOCUMENTATION. THE ABSCESS CAVITY IS COLLAPSED. THIS CORRELATES WITH THE CT. THERE IS A SMALL ADJACENT FISTULA FROM THE COLLAPSED ABSCESS CAVITY TO AN ADJACENT SMALL BOWEL LOOP.  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  PLAN: Exchange suction bulb for a gravity bag. Outpatient repeat injection on 11/28/2014   Electronically Signed  By: Jerilynn Mages. Shick M.D.    Assessment: He is doing much better today. The CT scan shows a fistula between the small bowel and the abscess cavity rather than the sigmoid colon as initially presumed. I will discuss the significance of this with Dr. Benson Norway, his gastroenterologist. 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 I will continue his antibiotic therapy and follow-up with him after his repeat drain study on 11/28/2014.  Plan: 1. Continue current antibiotics 2. Repeat drain study on 11/28/2014 3. Follow-up here on 11/30/2014   Vincent Bickers, MD [place] [facility] [phone] pager   [phone] cell 11/21/2014, 10:15 AM   Addendum:  Given the new finding of a small bowel fistula there is concern for reactivation of his Crohn's. Dr. Benson Norway is recommending restarting his Humira and I am in agreement. I will continue antibiotics at least until his next drain study and follow-up visit with me on 11/30/2014.  Vincent Bickers, MD [facility] [place] [phone] pager   [phone] cell 11/22/2014, 4:10 PM

## 2014-11-28 ENCOUNTER — Ambulatory Visit (HOSPITAL_COMMUNITY)
Admission: RE | Admit: 2014-11-28 | Discharge: 2014-11-28 | Disposition: A | Discharge status: Home / Self Care | Payer: 59 | Source: Ambulatory Visit | Attending: Interventional Radiology | Admitting: Interventional Radiology

## 2014-11-28 ENCOUNTER — Other Ambulatory Visit (HOSPITAL_COMMUNITY): Payer: Self-pay | Admitting: Interventional Radiology

## 2014-11-28 DIAGNOSIS — K50013 Crohn's disease of small intestine with fistula: Secondary | ICD-10-CM | POA: Diagnosis not present

## 2014-11-28 DIAGNOSIS — K630 Abscess of intestine: Secondary | ICD-10-CM

## 2014-11-28 DIAGNOSIS — K651 Peritoneal abscess: Secondary | ICD-10-CM | POA: Insufficient documentation

## 2014-11-28 DIAGNOSIS — L0291 Cutaneous abscess, unspecified: Secondary | ICD-10-CM

## 2014-11-28 MED ORDER — IOHEXOL 300 MG/ML  SOLN
50.0000 mL | Freq: Once | INTRAMUSCULAR | Status: AC | PRN
  Administered 2014-11-28: 1 mL

## 2014-11-28 MED ORDER — ONDANSETRON HCL 4 MG/2ML IJ SOLN: 4.0000 mg | Freq: Once | INTRAMUSCULAR | Status: DC

## 2014-11-28 MED ORDER — ONDANSETRON HCL 4 MG/2ML IJ SOLN
INTRAMUSCULAR | Status: AC
  Filled 2014-11-28: qty 2

## 2014-11-28 NOTE — Procedures (Signed)
Fluoroscopic guided injection of left lower quadrant drain demonstrates a persistent communication with an adjacent loop of small bowel. Pt to return to IR drain clinic in 2 weeks for repeat injection.

## 2014-11-29 ENCOUNTER — Other Ambulatory Visit (HOSPITAL_COMMUNITY): Payer: 59

## 2014-11-30 ENCOUNTER — Ambulatory Visit: Payer: 59 | Admitting: Internal Medicine

## 2014-12-04 ENCOUNTER — Inpatient Hospital Stay (HOSPITAL_COMMUNITY)
Admission: AD | Admit: 2014-12-04 | Discharge: 2014-12-20 | DRG: 853 | Disposition: A | Discharge status: Home Health | Payer: 59 | Source: Ambulatory Visit | Attending: Internal Medicine | Admitting: Internal Medicine

## 2014-12-04 ENCOUNTER — Telehealth: Payer: Self-pay | Admitting: *Deleted

## 2014-12-04 ENCOUNTER — Inpatient Hospital Stay (HOSPITAL_COMMUNITY): Payer: 59

## 2014-12-04 ENCOUNTER — Inpatient Hospital Stay (HOSPITAL_COMMUNITY): Payer: 59 | Admitting: Anesthesiology

## 2014-12-04 ENCOUNTER — Encounter (HOSPITAL_COMMUNITY)
Admission: AD | Disposition: A | Discharge status: Home Health | Payer: Self-pay | Source: Ambulatory Visit | Attending: Internal Medicine

## 2014-12-04 ENCOUNTER — Encounter (HOSPITAL_COMMUNITY): Payer: Self-pay | Admitting: Anesthesiology

## 2014-12-04 DIAGNOSIS — D62 Acute posthemorrhagic anemia: Secondary | ICD-10-CM | POA: Diagnosis present

## 2014-12-04 DIAGNOSIS — K50918 Crohn's disease, unspecified, with other complication: Secondary | ICD-10-CM | POA: Diagnosis not present

## 2014-12-04 DIAGNOSIS — G894 Chronic pain syndrome: Secondary | ICD-10-CM | POA: Diagnosis present

## 2014-12-04 DIAGNOSIS — B3789 Other sites of candidiasis: Secondary | ICD-10-CM | POA: Diagnosis not present

## 2014-12-04 DIAGNOSIS — D72829 Elevated white blood cell count, unspecified: Secondary | ICD-10-CM | POA: Diagnosis not present

## 2014-12-04 DIAGNOSIS — E860 Dehydration: Secondary | ICD-10-CM | POA: Diagnosis present

## 2014-12-04 DIAGNOSIS — Z8619 Personal history of other infectious and parasitic diseases: Secondary | ICD-10-CM

## 2014-12-04 DIAGNOSIS — A047 Enterocolitis due to Clostridium difficile: Secondary | ICD-10-CM | POA: Diagnosis present

## 2014-12-04 DIAGNOSIS — N179 Acute kidney failure, unspecified: Secondary | ICD-10-CM | POA: Diagnosis not present

## 2014-12-04 DIAGNOSIS — R1030 Lower abdominal pain, unspecified: Secondary | ICD-10-CM

## 2014-12-04 DIAGNOSIS — K50914 Crohn's disease, unspecified, with abscess: Secondary | ICD-10-CM | POA: Diagnosis present

## 2014-12-04 DIAGNOSIS — K593 Megacolon, not elsewhere classified: Secondary | ICD-10-CM | POA: Diagnosis not present

## 2014-12-04 DIAGNOSIS — Z4659 Encounter for fitting and adjustment of other gastrointestinal appliance and device: Secondary | ICD-10-CM | POA: Diagnosis not present

## 2014-12-04 DIAGNOSIS — A0472 Enterocolitis due to Clostridium difficile, not specified as recurrent: Secondary | ICD-10-CM | POA: Diagnosis present

## 2014-12-04 DIAGNOSIS — G8918 Other acute postprocedural pain: Secondary | ICD-10-CM | POA: Diagnosis not present

## 2014-12-04 DIAGNOSIS — N289 Disorder of kidney and ureter, unspecified: Secondary | ICD-10-CM | POA: Diagnosis not present

## 2014-12-04 DIAGNOSIS — K631 Perforation of intestine (nontraumatic): Secondary | ICD-10-CM | POA: Diagnosis present

## 2014-12-04 DIAGNOSIS — F22 Delusional disorders: Secondary | ICD-10-CM

## 2014-12-04 DIAGNOSIS — D473 Essential (hemorrhagic) thrombocythemia: Secondary | ICD-10-CM | POA: Diagnosis present

## 2014-12-04 DIAGNOSIS — E43 Unspecified severe protein-calorie malnutrition: Secondary | ICD-10-CM | POA: Diagnosis present

## 2014-12-04 DIAGNOSIS — Z87891 Personal history of nicotine dependence: Secondary | ICD-10-CM

## 2014-12-04 DIAGNOSIS — A419 Sepsis, unspecified organism: Secondary | ICD-10-CM | POA: Diagnosis not present

## 2014-12-04 DIAGNOSIS — R109 Unspecified abdominal pain: Secondary | ICD-10-CM | POA: Diagnosis present

## 2014-12-04 DIAGNOSIS — F411 Generalized anxiety disorder: Secondary | ICD-10-CM | POA: Diagnosis not present

## 2014-12-04 DIAGNOSIS — K659 Peritonitis, unspecified: Secondary | ICD-10-CM | POA: Diagnosis not present

## 2014-12-04 DIAGNOSIS — D638 Anemia in other chronic diseases classified elsewhere: Secondary | ICD-10-CM | POA: Diagnosis present

## 2014-12-04 DIAGNOSIS — K651 Peritoneal abscess: Secondary | ICD-10-CM | POA: Diagnosis present

## 2014-12-04 DIAGNOSIS — R739 Hyperglycemia, unspecified: Secondary | ICD-10-CM | POA: Diagnosis present

## 2014-12-04 DIAGNOSIS — K5090 Crohn's disease, unspecified, without complications: Secondary | ICD-10-CM | POA: Diagnosis present

## 2014-12-04 DIAGNOSIS — D649 Anemia, unspecified: Secondary | ICD-10-CM | POA: Diagnosis present

## 2014-12-04 DIAGNOSIS — K632 Fistula of intestine: Secondary | ICD-10-CM | POA: Diagnosis not present

## 2014-12-04 DIAGNOSIS — R000 Tachycardia, unspecified: Secondary | ICD-10-CM | POA: Diagnosis present

## 2014-12-04 DIAGNOSIS — B9689 Other specified bacterial agents as the cause of diseases classified elsewhere: Secondary | ICD-10-CM | POA: Diagnosis not present

## 2014-12-04 DIAGNOSIS — K630 Abscess of intestine: Secondary | ICD-10-CM | POA: Diagnosis present

## 2014-12-04 DIAGNOSIS — E872 Acidosis, unspecified: Secondary | ICD-10-CM | POA: Diagnosis present

## 2014-12-04 DIAGNOSIS — J939 Pneumothorax, unspecified: Secondary | ICD-10-CM

## 2014-12-04 DIAGNOSIS — D75839 Thrombocytosis, unspecified: Secondary | ICD-10-CM | POA: Diagnosis present

## 2014-12-04 DIAGNOSIS — R634 Abnormal weight loss: Secondary | ICD-10-CM | POA: Diagnosis present

## 2014-12-04 DIAGNOSIS — Z9049 Acquired absence of other specified parts of digestive tract: Secondary | ICD-10-CM | POA: Diagnosis not present

## 2014-12-04 DIAGNOSIS — R1013 Epigastric pain: Secondary | ICD-10-CM | POA: Diagnosis not present

## 2014-12-04 DIAGNOSIS — R6521 Severe sepsis with septic shock: Secondary | ICD-10-CM | POA: Diagnosis present

## 2014-12-04 DIAGNOSIS — E875 Hyperkalemia: Secondary | ICD-10-CM | POA: Diagnosis not present

## 2014-12-04 DIAGNOSIS — R1010 Upper abdominal pain, unspecified: Secondary | ICD-10-CM | POA: Diagnosis not present

## 2014-12-04 DIAGNOSIS — I10 Essential (primary) hypertension: Secondary | ICD-10-CM | POA: Diagnosis present

## 2014-12-04 DIAGNOSIS — K50913 Crohn's disease, unspecified, with fistula: Secondary | ICD-10-CM | POA: Diagnosis not present

## 2014-12-04 DIAGNOSIS — Z6821 Body mass index (BMI) 21.0-21.9, adult: Secondary | ICD-10-CM | POA: Diagnosis not present

## 2014-12-04 DIAGNOSIS — A4159 Other Gram-negative sepsis: Principal | ICD-10-CM | POA: Diagnosis present

## 2014-12-04 DIAGNOSIS — I471 Supraventricular tachycardia: Secondary | ICD-10-CM | POA: Diagnosis not present

## 2014-12-04 DIAGNOSIS — R1084 Generalized abdominal pain: Secondary | ICD-10-CM | POA: Diagnosis not present

## 2014-12-04 DIAGNOSIS — R601 Generalized edema: Secondary | ICD-10-CM | POA: Diagnosis not present

## 2014-12-04 DIAGNOSIS — K50919 Crohn's disease, unspecified, with unspecified complications: Secondary | ICD-10-CM | POA: Diagnosis not present

## 2014-12-04 DIAGNOSIS — E871 Hypo-osmolality and hyponatremia: Secondary | ICD-10-CM | POA: Diagnosis present

## 2014-12-04 DIAGNOSIS — E861 Hypovolemia: Secondary | ICD-10-CM | POA: Diagnosis present

## 2014-12-04 DIAGNOSIS — J811 Chronic pulmonary edema: Secondary | ICD-10-CM

## 2014-12-04 DIAGNOSIS — E876 Hypokalemia: Secondary | ICD-10-CM | POA: Diagnosis not present

## 2014-12-04 DIAGNOSIS — Z8719 Personal history of other diseases of the digestive system: Secondary | ICD-10-CM | POA: Diagnosis not present

## 2014-12-04 DIAGNOSIS — R509 Fever, unspecified: Secondary | ICD-10-CM | POA: Diagnosis present

## 2014-12-04 DIAGNOSIS — E162 Hypoglycemia, unspecified: Secondary | ICD-10-CM | POA: Diagnosis not present

## 2014-12-04 DIAGNOSIS — R5381 Other malaise: Secondary | ICD-10-CM | POA: Diagnosis not present

## 2014-12-04 DIAGNOSIS — R6520 Severe sepsis without septic shock: Secondary | ICD-10-CM

## 2014-12-04 DIAGNOSIS — K5931 Toxic megacolon: Secondary | ICD-10-CM | POA: Diagnosis present

## 2014-12-04 DIAGNOSIS — R609 Edema, unspecified: Secondary | ICD-10-CM

## 2014-12-04 DIAGNOSIS — Z79899 Other long term (current) drug therapy: Secondary | ICD-10-CM | POA: Diagnosis present

## 2014-12-04 DIAGNOSIS — R1011 Right upper quadrant pain: Secondary | ICD-10-CM | POA: Diagnosis present

## 2014-12-04 HISTORY — PX: LAPAROTOMY: SHX154

## 2014-12-04 LAB — COMPREHENSIVE METABOLIC PANEL
ALT: 8 U/L (ref 0–53)
AST: 26 U/L (ref 0–37)
Albumin: 1.8 g/dL — ABNORMAL LOW (ref 3.5–5.2)
Alkaline Phosphatase: 111 U/L (ref 39–117)
Anion gap: 15 (ref 5–15)
BUN: 6 mg/dL (ref 6–23)
CO2: 21 mmol/L (ref 19–32)
Calcium: 7.9 mg/dL — ABNORMAL LOW (ref 8.4–10.5)
Chloride: 94 mmol/L — ABNORMAL LOW (ref 96–112)
Creatinine, Ser: 0.79 mg/dL (ref 0.50–1.35)
GFR calc Af Amer: >90 mL/min (ref >90)
GFR calc non Af Amer: >90 mL/min (ref >90)
GLUCOSE: 213 mg/dL — AB (ref 70–99)
Potassium: 3.6 mmol/L (ref 3.5–5.1)
Sodium: 130 mmol/L — ABNORMAL LOW (ref 135–145)
TOTAL PROTEIN: 5.9 g/dL — AB (ref 6.0–8.3)
Total Bilirubin: 0.9 mg/dL (ref 0.3–1.2)

## 2014-12-04 LAB — PHOSPHORUS: PHOSPHORUS: 3.6 mg/dL (ref 2.3–4.6)

## 2014-12-04 LAB — LACTIC ACID, PLASMA
LACTIC ACID, VENOUS: 4.1 mmol/L — AB (ref 0.5–2.0)
Lactic Acid, Venous: 4 mmol/L (ref 0.5–2.0)

## 2014-12-04 LAB — MRSA PCR SCREENING: MRSA BY PCR: NEGATIVE

## 2014-12-04 LAB — PROTIME-INR
INR: 1.52 — AB (ref 0.00–1.49)
PROTHROMBIN TIME: 18.4 s — AB (ref 11.6–15.2)

## 2014-12-04 LAB — APTT: APTT: 35 s (ref 24–37)

## 2014-12-04 LAB — MAGNESIUM: Magnesium: 1.3 mg/dL — ABNORMAL LOW (ref 1.5–2.5)

## 2014-12-04 SURGERY — LAPAROTOMY, EXPLORATORY
Anesthesia: General | Site: Abdomen

## 2014-12-04 MED ORDER — HYDROMORPHONE HCL 1 MG/ML IJ SOLN
INTRAMUSCULAR | Status: AC
  Filled 2014-12-04: qty 1

## 2014-12-04 MED ORDER — PANTOPRAZOLE SODIUM 40 MG IV SOLR
40.0000 mg | INTRAVENOUS | Status: DC
  Administered 2014-12-05 – 2014-12-06 (×2): 40 mg via INTRAVENOUS
  Filled 2014-12-04 (×4): qty 40

## 2014-12-04 MED ORDER — HEPARIN SODIUM (PORCINE) 5000 UNIT/ML IJ SOLN
5000.0000 [IU] | Freq: Three times a day (TID) | INTRAMUSCULAR | Status: DC
  Administered 2014-12-05 – 2014-12-20 (×46): 5000 [IU] via SUBCUTANEOUS
  Filled 2014-12-04 (×49): qty 1

## 2014-12-04 MED ORDER — GLYCOPYRROLATE 0.2 MG/ML IJ SOLN
INTRAMUSCULAR | Status: DC | PRN
Start: 2014-12-04 — End: 2014-12-04
  Administered 2014-12-04: 0.4 mg via INTRAVENOUS

## 2014-12-04 MED ORDER — METHOCARBAMOL 1000 MG/10ML IJ SOLN: 1000.0000 mg | Freq: Three times a day (TID) | INTRAVENOUS | Status: DC | PRN

## 2014-12-04 MED ORDER — ONDANSETRON HCL 4 MG/2ML IJ SOLN
INTRAMUSCULAR | Status: DC | PRN
  Administered 2014-12-04: 4 mg via INTRAVENOUS

## 2014-12-04 MED ORDER — HYDROMORPHONE HCL 1 MG/ML IJ SOLN
1.0000 mg | INTRAMUSCULAR | Status: DC | PRN
  Administered 2014-12-04: 1 mg via INTRAVENOUS
  Administered 2014-12-04: 2 mg via INTRAVENOUS
  Filled 2014-12-04: qty 2
  Filled 2014-12-04: qty 1

## 2014-12-04 MED ORDER — CIPROFLOXACIN IN D5W 400 MG/200ML IV SOLN
400.0000 mg | Freq: Two times a day (BID) | INTRAVENOUS | Status: DC
  Administered 2014-12-04: 400 mg via INTRAVENOUS
  Filled 2014-12-04 (×2): qty 200

## 2014-12-04 MED ORDER — PIPERACILLIN-TAZOBACTAM 3.375 G IVPB
3.3750 g | Freq: Three times a day (TID) | INTRAVENOUS | Status: DC
  Administered 2014-12-04 – 2014-12-19 (×45): 3.375 g via INTRAVENOUS
  Filled 2014-12-04 (×49): qty 50

## 2014-12-04 MED ORDER — PROMETHAZINE HCL 25 MG/ML IJ SOLN: 6.2500 mg | INTRAMUSCULAR | Status: DC | PRN

## 2014-12-04 MED ORDER — PHENYLEPHRINE HCL 10 MG/ML IJ SOLN
10.0000 mg | INTRAVENOUS | Status: DC | PRN
  Administered 2014-12-04: 10 ug/min via INTRAVENOUS

## 2014-12-04 MED ORDER — MORPHINE SULFATE ER 15 MG PO TBCR: 30.0000 mg | EXTENDED_RELEASE_TABLET | Freq: Two times a day (BID) | ORAL | Status: DC

## 2014-12-04 MED ORDER — ONDANSETRON HCL 4 MG PO TABS
4.0000 mg | ORAL_TABLET | Freq: Four times a day (QID) | ORAL | Status: DC | PRN
  Administered 2014-12-19: 4 mg via ORAL
  Filled 2014-12-04: qty 1

## 2014-12-04 MED ORDER — ALPRAZOLAM 0.25 MG PO TABS: 0.2500 mg | ORAL_TABLET | Freq: Three times a day (TID) | ORAL | Status: DC | PRN

## 2014-12-04 MED ORDER — HYDROMORPHONE HCL 1 MG/ML IJ SOLN
0.2500 mg | INTRAMUSCULAR | Status: DC | PRN
  Administered 2014-12-04 (×4): 0.5 mg via INTRAVENOUS

## 2014-12-04 MED ORDER — KETOROLAC TROMETHAMINE 30 MG/ML IJ SOLN: 30.0000 mg | Freq: Once | INTRAMUSCULAR | Status: DC | PRN

## 2014-12-04 MED ORDER — ENOXAPARIN SODIUM 40 MG/0.4ML ~~LOC~~ SOLN: 40.0000 mg | SUBCUTANEOUS | Status: DC

## 2014-12-04 MED ORDER — ROCURONIUM BROMIDE 100 MG/10ML IV SOLN
INTRAVENOUS | Status: DC | PRN
  Administered 2014-12-04: 10 mg via INTRAVENOUS
  Administered 2014-12-04: 40 mg via INTRAVENOUS

## 2014-12-04 MED ORDER — HEPARIN SODIUM (PORCINE) 5000 UNIT/ML IJ SOLN
5000.0000 [IU] | Freq: Once | INTRAMUSCULAR | Status: AC
  Administered 2014-12-04: 5000 [IU] via SUBCUTANEOUS

## 2014-12-04 MED ORDER — LACTATED RINGERS IV SOLN
INTRAVENOUS | Status: DC | PRN
  Administered 2014-12-04 (×3): via INTRAVENOUS

## 2014-12-04 MED ORDER — VANCOMYCIN HCL IN DEXTROSE 1-5 GM/200ML-% IV SOLN
1000.0000 mg | Freq: Three times a day (TID) | INTRAVENOUS | Status: DC
  Administered 2014-12-05 – 2014-12-06 (×5): 1000 mg via INTRAVENOUS
  Filled 2014-12-04 (×7): qty 200

## 2014-12-04 MED ORDER — MAGNESIUM SULFATE 2 GM/50ML IV SOLN: 2.0000 g | Freq: Once | INTRAVENOUS | Status: DC

## 2014-12-04 MED ORDER — NEOSTIGMINE METHYLSULFATE 10 MG/10ML IV SOLN
INTRAVENOUS | Status: DC | PRN
  Administered 2014-12-04: 3 mg via INTRAVENOUS
  Administered 2014-12-04: 2 mg via INTRAVENOUS

## 2014-12-04 MED ORDER — IOHEXOL 300 MG/ML  SOLN
25.0000 mL | INTRAMUSCULAR | Status: AC
  Administered 2014-12-04: 25 mL via ORAL

## 2014-12-04 MED ORDER — ONDANSETRON HCL 4 MG/2ML IJ SOLN
4.0000 mg | Freq: Four times a day (QID) | INTRAMUSCULAR | Status: DC | PRN
  Administered 2014-12-04 – 2014-12-14 (×5): 4 mg via INTRAVENOUS
  Filled 2014-12-04 (×5): qty 2

## 2014-12-04 MED ORDER — 0.9 % SODIUM CHLORIDE (POUR BTL) OPTIME
TOPICAL | Status: DC | PRN
  Administered 2014-12-04: 11000 mL

## 2014-12-04 MED ORDER — KETAMINE HCL 100 MG/ML IJ SOLN
INTRAMUSCULAR | Status: AC
  Filled 2014-12-04: qty 1

## 2014-12-04 MED ORDER — MIDAZOLAM HCL 5 MG/5ML IJ SOLN
INTRAMUSCULAR | Status: DC | PRN
  Administered 2014-12-04: 2 mg via INTRAVENOUS

## 2014-12-04 MED ORDER — KETAMINE HCL 50 MG/ML IJ SOLN
INTRAMUSCULAR | Status: DC | PRN
  Administered 2014-12-04 (×2): 50 mg via INTRAMUSCULAR

## 2014-12-04 MED ORDER — VANCOMYCIN 50 MG/ML ORAL SOLUTION
125.0000 mg | Freq: Four times a day (QID) | ORAL | Status: DC
  Filled 2014-12-04 (×4): qty 2.5

## 2014-12-04 MED ORDER — MIDAZOLAM HCL 2 MG/2ML IJ SOLN
INTRAMUSCULAR | Status: AC
  Filled 2014-12-04: qty 2

## 2014-12-04 MED ORDER — HYDROMORPHONE HCL 1 MG/ML IJ SOLN
1.0000 mg | INTRAMUSCULAR | Status: DC | PRN
  Administered 2014-12-04: 1 mg via INTRAVENOUS
  Filled 2014-12-04: qty 1

## 2014-12-04 MED ORDER — SUCCINYLCHOLINE CHLORIDE 20 MG/ML IJ SOLN
INTRAMUSCULAR | Status: DC | PRN
  Administered 2014-12-04: 120 mg via INTRAVENOUS

## 2014-12-04 MED ORDER — FENTANYL CITRATE (PF) 100 MCG/2ML IJ SOLN
INTRAMUSCULAR | Status: DC | PRN
  Administered 2014-12-04: 50 ug via INTRAVENOUS

## 2014-12-04 MED ORDER — FENTANYL CITRATE (PF) 100 MCG/2ML IJ SOLN
25.0000 ug | INTRAMUSCULAR | Status: DC | PRN
  Administered 2014-12-05: 100 ug via INTRAVENOUS
  Administered 2014-12-05: 50 ug via INTRAVENOUS
  Administered 2014-12-05: 100 ug via INTRAVENOUS
  Filled 2014-12-04 (×3): qty 2

## 2014-12-04 MED ORDER — LIDOCAINE HCL (CARDIAC) 20 MG/ML IV SOLN
INTRAVENOUS | Status: DC | PRN
  Administered 2014-12-04: 100 mg via INTRAVENOUS

## 2014-12-04 MED ORDER — SODIUM CHLORIDE 0.9 % IV SOLN
100.0000 mg | Freq: Every day | INTRAVENOUS | Status: DC
  Filled 2014-12-04: qty 100

## 2014-12-04 MED ORDER — FLUCONAZOLE IN SODIUM CHLORIDE 200-0.9 MG/100ML-% IV SOLN
200.0000 mg | INTRAVENOUS | Status: DC
  Administered 2014-12-05: 200 mg via INTRAVENOUS
  Filled 2014-12-04: qty 100

## 2014-12-04 MED ORDER — FENTANYL CITRATE (PF) 250 MCG/5ML IJ SOLN
INTRAMUSCULAR | Status: AC
  Filled 2014-12-04: qty 5

## 2014-12-04 MED ORDER — SODIUM CHLORIDE 0.9 % IV SOLN
INTRAVENOUS | Status: DC
  Administered 2014-12-04: 16:00:00 via INTRAVENOUS

## 2014-12-04 MED ORDER — PROPOFOL 10 MG/ML IV BOLUS
INTRAVENOUS | Status: DC | PRN
  Administered 2014-12-04: 100 mg via INTRAVENOUS

## 2014-12-04 MED ORDER — MEPERIDINE HCL 25 MG/ML IJ SOLN: 6.2500 mg | INTRAMUSCULAR | Status: DC | PRN

## 2014-12-04 SURGICAL SUPPLY — 55 items
BLADE SURG ROTATE 9660 (MISCELLANEOUS) IMPLANT
BNDG GAUZE ELAST 4 BULKY (GAUZE/BANDAGES/DRESSINGS) ×2 IMPLANT
CANISTER SUCTION 2500CC (MISCELLANEOUS) ×3 IMPLANT
CHLORAPREP W/TINT 26ML (MISCELLANEOUS) ×3 IMPLANT
COVER MAYO STAND STRL (DRAPES) IMPLANT
COVER SURGICAL LIGHT HANDLE (MISCELLANEOUS) ×3 IMPLANT
DRAPE LAPAROSCOPIC ABDOMINAL (DRAPES) ×3 IMPLANT
DRAPE PROXIMA HALF (DRAPES) IMPLANT
DRAPE UTILITY XL STRL (DRAPES) ×6 IMPLANT
DRAPE WARM FLUID 44X44 (DRAPE) ×3 IMPLANT
DRSG OPSITE POSTOP 4X10 (GAUZE/BANDAGES/DRESSINGS) IMPLANT
DRSG OPSITE POSTOP 4X8 (GAUZE/BANDAGES/DRESSINGS) IMPLANT
ELECT BLADE 6.5 EXT (BLADE) IMPLANT
ELECT CAUTERY BLADE 6.4 (BLADE) ×6 IMPLANT
ELECT REM PT RETURN 9FT ADLT (ELECTROSURGICAL) ×3
ELECTRODE REM PT RTRN 9FT ADLT (ELECTROSURGICAL) ×1 IMPLANT
GAUZE SPONGE 4X4 12PLY STRL (GAUZE/BANDAGES/DRESSINGS) ×2 IMPLANT
GLOVE BIO SURGEON STRL SZ7 (GLOVE) ×3 IMPLANT
GLOVE BIOGEL PI IND STRL 7.5 (GLOVE) ×1 IMPLANT
GLOVE BIOGEL PI INDICATOR 7.5 (GLOVE) ×2
GOWN STRL REUS W/ TWL LRG LVL3 (GOWN DISPOSABLE) ×3 IMPLANT
GOWN STRL REUS W/TWL LRG LVL3 (GOWN DISPOSABLE) ×9
KIT BASIN OR (CUSTOM PROCEDURE TRAY) ×3 IMPLANT
KIT OSTOMY DRAINABLE 2.75 STR (WOUND CARE) ×2 IMPLANT
KIT ROOM TURNOVER OR (KITS) ×3 IMPLANT
LIGASURE IMPACT 36 18CM CVD LR (INSTRUMENTS) IMPLANT
NS IRRIG 1000ML POUR BTL (IV SOLUTION) ×22 IMPLANT
PACK GENERAL/GYN (CUSTOM PROCEDURE TRAY) ×3 IMPLANT
PAD ARMBOARD 7.5X6 YLW CONV (MISCELLANEOUS) ×3 IMPLANT
PENCIL BUTTON HOLSTER BLD 10FT (ELECTRODE) IMPLANT
RELOAD PROXIMATE 75MM BLUE (ENDOMECHANICALS) ×9 IMPLANT
RELOAD STAPLE 75 3.8 BLU REG (ENDOMECHANICALS) IMPLANT
SPECIMEN JAR LARGE (MISCELLANEOUS) ×4 IMPLANT
SPONGE LAP 18X18 X RAY DECT (DISPOSABLE) ×8 IMPLANT
STAPLER CUT CVD 40MM GREEN (STAPLE) ×2 IMPLANT
STAPLER GUN LINEAR PROX 60 (STAPLE) ×2 IMPLANT
STAPLER PROXIMATE 75MM BLUE (STAPLE) ×4 IMPLANT
STAPLER VISISTAT 35W (STAPLE) ×3 IMPLANT
SUCTION POOLE TIP (SUCTIONS) ×5 IMPLANT
SUT PDS AB 1 TP1 96 (SUTURE) ×6 IMPLANT
SUT PROLENE 2 0 CT2 30 (SUTURE) ×4 IMPLANT
SUT SILK 2 0 (SUTURE) ×3
SUT SILK 2 0 SH CR/8 (SUTURE) ×3 IMPLANT
SUT SILK 2-0 18XBRD TIE 12 (SUTURE) ×1 IMPLANT
SUT SILK 3 0 (SUTURE) ×3
SUT SILK 3 0 SH CR/8 (SUTURE) ×3 IMPLANT
SUT SILK 3-0 18XBRD TIE 12 (SUTURE) ×1 IMPLANT
SUT VIC AB 3-0 SH 27 (SUTURE)
SUT VIC AB 3-0 SH 27X BRD (SUTURE) IMPLANT
TAPE CLOTH SURG 6X10 WHT LF (GAUZE/BANDAGES/DRESSINGS) ×2 IMPLANT
TOWEL OR 17X26 10 PK STRL BLUE (TOWEL DISPOSABLE) ×3 IMPLANT
TRAY FOLEY CATH 16FRSI W/METER (SET/KITS/TRAYS/PACK) IMPLANT
TUBE CONNECTING 12'X1/4 (SUCTIONS)
TUBE CONNECTING 12X1/4 (SUCTIONS) IMPLANT
YANKAUER SUCT BULB TIP NO VENT (SUCTIONS) ×2 IMPLANT

## 2014-12-04 NOTE — Consult Note (Signed)
[facility] Surgery Consult Note  Vincent Black 10-Jan-1971  [id number].    Requesting MD:  Chief Complaint/Reason for Consult:   HPI:  45 y/o white male well known to our service due to recent admissions with PMH of Crohn's disease, ankylosing spondylitis, anxiety, chronic pain on high doses of narcotics, recently discharged from this hospital on 10/10/14 after being treated for C. difficile colitis. Patient presented to the hospital on 3/17 with abdominal pain and sepsis. CT of the abdomen showed sigmoid abscess. Patient was then admitted for further evaluation and treatment. Patient was started on empiric antibiotics, kept nothing by mouth, gastroenterology, surgery,interventional radiology was consulted. She underwent IR drain placement on 10/27/14. Infectious disease directed antibiotic regimen. Had been started on TNA. Cultures showed citrobacter and klebsiella, ID.  Repeat CT of the abdomen on 3/25 showed significant improvement, pigtail was left in place.  CT on 11/17/14, 11/28/14 showed collapsed abscess cavity do adjacent small bowel loops, positive fistula.   He returns today with severe RUQ and otherwise moderate pain diffusely in the lower abdomen.  C/o persistent N/V.  His wife and dad said he was fine up until yesterday.  He was traveling to his appointments with Dr. Benson Norway, Dr.  Salon, and IR CT scans and was doing well.  He was eating better, going outside, feeling stronger, then yesterday he gradually started getting more nauseated and started vomiting with severe pain.  This time the pain was in his RLQ 10/10, and he said it feels exactly the same as the abscess felt on the left before.  No precipitating/alleviating factors.  No radiating pain.  No fever/chills, but noted hematemesis, sweats, generalized weakness, and confusion.  His wife said he threw up his medications yesterday evening and this am.  His perc drain has continued to drain feculent drainage, but not high quantities and  Dr. Benson Norway and Dr.  Salon decided to leave the drain in for a while longer despite resolution of abscess. Dr.  Salon and Dr. Benson Norway agreed to restart his Humira on 11/22/14.  He was due a follow up CT scan tomorrow, but was asked to be directly admitted by Dr. Benson Norway today.  He was admitted to 6N then transferred to 3S due to signs of sepsis and altered mental status.     ROS: All systems reviewed and otherwise negative except for as above  No family history on file.  Past Medical History  Diagnosis Date  . Crohn disease   . C. difficile colitis 09/30/2014  . Ankylosing spondylitis 09/30/2014  . Anxiety     Past Surgical History  Procedure Laterality Date  . Flexible sigmoidoscopy N/A 09/29/2014    Procedure: FLEXIBLE SIGMOIDOSCOPY;  Surgeon: Beryle Beams, MD;  Location: [place];  Service: Endoscopy;  Laterality: N/A;    Social History:  reports that he has quit smoking. His smoking use included Cigarettes. He has a 5 pack-year smoking history. He has never used smokeless tobacco. He reports that he drinks alcohol. He reports that he does not use illicit drugs.  Allergies: No Known Allergies  Medications Prior to Admission  Medication Sig Dispense Refill  . ALPRAZolam (XANAX) 0.25 MG tablet Take 0.25 mg by mouth 3 (three) times daily as needed for anxiety.    . ciprofloxacin (CIPRO) 500 MG tablet Take 1 tablet (500 mg total) by mouth 2 (two) times daily. 30 tablet 0  . fluconazole (DIFLUCAN) 100 MG tablet Take 1 tablet (100 mg total) by mouth daily. 14 tablet 1  . methocarbamol (  ROBAXIN) 500 MG tablet Take 2 tablets (1,000 mg total) by mouth every 8 (eight) hours as needed for muscle spasms. 30 tablet 0  . metroNIDAZOLE (FLAGYL) 500 MG tablet Take 1 tablet (500 mg total) by mouth every 8 (eight) hours. 90 tablet 1  . morphine (MS CONTIN) 30 MG 12 hr tablet Take 1 tablet (30 mg total) by mouth every 12 (twelve) hours. 60 tablet 0  . oxyCODONE-acetaminophen (PERCOCET/ROXICET) 5-325  MG per tablet Take 1 tablet by mouth every 6 (six) hours as needed for moderate pain or severe pain.    . pantoprazole (PROTONIX) 40 MG tablet Take 1 tablet (40 mg total) by mouth daily. 30 tablet 0  . Probiotic Product (PROBIOTIC PO) Take 1 tablet by mouth daily. Chewable    . promethazine (PHENERGAN) 6.25 MG/5ML syrup Take 12.5 to 25 mg every 6 hours as needed for nausea. 240 mL 2  . vancomycin (VANCOCIN) 50 mg/mL oral solution Take 2.5 mLs (125 mg total) by mouth every 6 (six) hours. 10 mL 0    Blood pressure 147/104, pulse 144, temperature 97.9 F (36.6 C), temperature source Oral, SpO2 98 %. Physical Exam: General: WD/WN white male who is laying in bed sweating, appears anxious and sick HEENT: head is normocephalic, atraumatic.  Sclera are noninjected.  PERRL.  Ears and nose without any masses or lesions.  Mouth is pink, but dry. Heart: tachycardic, normal rhythm.  No obvious murmurs, gallops, or rubs noted.  Palpable pedal pulses bilaterally Lungs: CTAB, no wheezes, rhonchi, or rales noted.  Respiratory effort nonlabored, good effort. Abd: soft, exquisitely tender in the RLQ, moderate tenderness on the LLQ, minimal tenderness in the upper abdomen, diminished BS, perc drain in LLQ with feculent output in gravity bag MS: all 4 extremities are symmetrical with no cyanosis, clubbing, or edema. Skin: warm and dry with no masses, lesions, or rashes Psych: A&Ox3 with an appropriate affect.   No results found for this or any previous visit (from the past 48 hour(s)). No results found.   Assessment/Plan Severe RLQ abdominal pain with nausea/vomiting -Concern for new abscess -Dr. Doyle Askew has ordered a CT scan of abdomen/pelvis, labs, PICC, started zosyn, and NG tube, NPO -Increased dilaudid dosing secondary to him not getting his PO MS contin in 24hr would expect his chronic pain to be quite severe. Intra-abdominal abscess with crohn's disease (Admitted between 10/26/14 to 11/08/14) -On  cipro/flagyl for citrobacter and klebsiella, also on diflucan per ID -Had been following up with Dr. Benson Norway and IR regarding eventual removal of IR perc drain -Dr. Dalbert Batman recommended he follow up with [facility] given the complexity of his conditions.  May need to consider transfer to a higher level of care.   -At the time Dr. Dalbert Batman recommended holding off on immunosuppressive therapy until abscess/fisutla has healed, however it looks like Dr. Alastor Kneale Salon and Dr. Benson Norway restarted his Humira on 11/22/14. PCM Recurrent C.diff -Followed by ID on oral vancomycin -C.diff precautions DVT Proph -Lovenox, SCD's   Coralie Keens, [facility] Surgery 12/04/2014, 3:52 PM Pager: [phone]

## 2014-12-04 NOTE — Progress Notes (Signed)
eLink Physician-Brief Progress Note Patient Name: Vincent Black DOB: 1971-01-25 MRN: [id number]   Date of Service  12/04/2014  HPI/Events of Note  Copious Emesis.   eICU Interventions  Will order NGT to LIS.     Intervention Category Intermediate Interventions: Other:  Lysle Dingwall 12/04/2014, 3:28 PM

## 2014-12-04 NOTE — Anesthesia Procedure Notes (Signed)
Procedure Name: Intubation Date/Time: 12/04/2014 6:31 PM Performed by: Manus Gunning, Tylisa Alcivar J Pre-anesthesia Checklist: Patient identified, Timeout performed, Emergency Drugs available, Suction available and Patient being monitored Patient Re-evaluated:Patient Re-evaluated prior to inductionOxygen Delivery Method: Circle system utilized Preoxygenation: Pre-oxygenation with 100% oxygen Intubation Type: IV induction and Rapid sequence Laryngoscope Size: Mac and 4 Grade View: Grade I Tube type: Oral Tube size: 8.0 mm Number of attempts: 1 Placement Confirmation: ETT inserted through vocal cords under direct vision,  positive ETCO2 and breath sounds checked- equal and bilateral Secured at: 22 cm Tube secured with: Tape Dental Injury: Teeth and Oropharynx as per pre-operative assessment

## 2014-12-04 NOTE — Progress Notes (Signed)
Pt headed to OR urgently, RR escorting, family present and aware of plan. Pt will head to ICU post op per DR.

## 2014-12-04 NOTE — Anesthesia Preprocedure Evaluation (Addendum)
Anesthesia Evaluation  Patient identified by MRN, date of birth, ID band Patient awake    Reviewed: Allergy & Precautions, NPO status , Patient's Chart, lab work & pertinent test results  Airway Mallampati: II  TM Distance: >3 FB Neck ROM: Full    Dental no notable dental hx.    Pulmonary former smoker,  breath sounds clear to auscultation  Pulmonary exam normal       Cardiovascular negative cardio ROS  Rhythm:Regular Rate:Tachycardia     Neuro/Psych PSYCHIATRIC DISORDERS Anxiety negative neurological ROS     GI/Hepatic negative GI ROS, Neg liver ROS,   Endo/Other  negative endocrine ROS  Renal/GU negative Renal ROS     Musculoskeletal negative musculoskeletal ROS (+)   Abdominal   Peds  Hematology  (+) anemia ,   Anesthesia Other Findings   Reproductive/Obstetrics                           Anesthesia Physical Anesthesia Plan  ASA: III and emergent  Anesthesia Plan: General   Post-op Pain Management:    Induction: Intravenous, Rapid sequence and Cricoid pressure planned  Airway Management Planned: Oral ETT  Additional Equipment: Arterial line, CVP and Ultrasound Guidance Line Placement  Intra-op Plan:   Post-operative Plan: Extubation in OR  Informed Consent: I have reviewed the patients History and Physical, chart, labs and discussed the procedure including the risks, benefits and alternatives for the proposed anesthesia with the patient or authorized representative who has indicated his/her understanding and acceptance.   Dental advisory given  Plan Discussed with: CRNA  Anesthesia Plan Comments: (2 x PIV)      Anesthesia Quick Evaluation

## 2014-12-04 NOTE — Transfer of Care (Signed)
Immediate Anesthesia Transfer of Care Note  Patient: Vincent Black  Procedure(s) Performed: Procedure(s): EXPLORATORY LAPAROTOMY, Total Colectomy (N/A)  Patient Location: PACU  Anesthesia Type:General  Level of Consciousness: awake, alert  and oriented  Airway & Oxygen Therapy: Patient Spontanous Breathing and Patient connected to face mask oxygen  Post-op Assessment: Report given to RN and Post -op Vital signs reviewed and stable  Post vital signs: Reviewed and stable  Last Vitals:  Filed Vitals:   12/04/14 1730  BP: 116/95  Pulse: 136  Temp:   Resp: 11    Complications: No apparent anesthesia complications

## 2014-12-04 NOTE — Op Note (Signed)
Preoperative diagnosis: perforated viscus Postoperative diagnosis: perforated sigmoid colon, perforated cecum toxic megacolon, small bowel fistula Procedure:  1. Ex lap 2. Small bowel resection with anastomosis 3. Total abdominal colectomy with end ileostomy Surgeon: Dr Serita Grammes Asst: Dr Stark Klein EBL: 50 cc Anesthesia: general Drains: 83 Fr Blake drain to llq Specimen: small bowel, total abdominal colon Complications: none Dispo to icu stable Sponge and needle count correct times two  Indications: This is a 44 year old male with a history of Crohn's disease. Over the last couple of months he has had C. Difficile colitis as well as a drain in his left lower quadrant. 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 Today he worsened acutely and he presents with a large volume of free air. I discussed with he and his family going to the operating room for laparotomy.  Procedure: After informed consent was obtained the patient was taken to the operating room. He was given Zosyn. Sequential compression devices were on her legs. I gave him subcutaneous heparin prior to beginning. He was then placed under general anesthesia without complication. His abdomen was prepped and draped in the standard sterile surgical fashion. A surgical timeout was then performed.  He had a right internal jugular vein as well as a right radial arterial line placed by anesthesia. He had a Foley catheter as well as a nasogastric tube placed.  I then made a midline incision and entered his peritoneum. There was a large amount of air as well as purulence and stool that were came from his abdomen once I entered. I explored his abdomen in all 4 quadrants. There was stool present in all 4 quadrants of his abdomen. I first identified the drain going into the small bowel. This actually was inside his small bowel. I removed the drain. I then used a GIA stapler to remove this segment  of bowel as there was a very large hole in it was leaking stool. I then used a stapler to perform a side-to-side functional end-to-end anastomosis. I created the common enterotomy with the GIA. I placed 2 silk crotch stitches. I then closed the common enterotomy with the [place] stapler. I oversewed the staple line with 3-0 silk suture. This was patent and hemostatic upon completion. This was all viable. I then went to his left lower quadrant. His sigmoid colon essentially been completely disrupted with a large abscess and leakage of a large amount of stool. I was able to rotate this medially. This was difficult to identify any structures due to the fact that this is been chronically inflamed with a very large abscess cavity. I used the LigaSure to divide this down to the superior rectum. I then used a contour stapler to divide this and passes off the table as a specimen. I did place Prolene sutures on the rectal stump for future surgery. I then was able to mobilize his colon on this side it was at that point that I saw the remainder of his transverse colon. This appeared to be very dusky. I then inspected his right lower quadrant and noted that he had a thinned out necrotic portion of cecum that was also leaking stool. Upon examination of the entire colon I realized that this was likely a toxic megacolon. I then proceeded with the total abdominal colectomy. I freed the attachments at the splenic flexure and remove the omentum from the transverse colon. I then was able to rotate the hepatic flexure and  medialized the entire colon. I then used the LigaSure device to divide the mesentery. I used a GIA stapler to come across the terminal ileum and the colon was removed in its entirety. I then irrigated with about 13 L until this was clear. I placed a drain in his left lower quadrant and secured this with a 2-0 nylon suture. I then made a hole in his abdominal wall on the right side. I went through the rectus muscle and  brought the ileum out. I then closed the fascia with #1 loop PDS. Dressing was placed and the wound was left open. I then matured his ileostomy with 3-0 Vicryl suture. The bag was placed over this. Dressings were placed. He tolerated this well and was extubated and transferred to recovery.

## 2014-12-04 NOTE — Progress Notes (Signed)
Patient transferred to 3S02 as ordered, reported to nurse [facility].

## 2014-12-04 NOTE — Significant Event (Signed)
Rapid Response Event Note  Overview: Time Called: 1537 Arrival Time: 1540 Event Type: Other (Comment) (multi med)  Initial Focused Assessment: Patient alert and oriented.  C/O abd pain, nausea and vomiting.  Vomiting gastric contents, brown bilious, some coffee ground. BP 142/108  ST 138  RR 17 O2 sat 97% Lung sounds decreased bases, regular heart tones, no bowel sounds. Abdomen tight and painful  Interventions: NG tube placed:  Brown drainage, some coffee ground. 300 cc output immediately. PCXR and KUB done Labs drawn.  [place] Dr Donne Hazel at bedside.  Discussed need for OR.  Family at bedside.   OR and Blood consent signed.  1740 Patient transported to OR holding.  Family assisted to OR waiting area.  Event Summary:   at    Name of Consulting Physician Notified: Dort PA at bedside upon my arrival.  Dr Donne Hazel notified of xray results. at    Outcome: Transferred (Comment) (OR then 99M)  Event End Time: 1800  Raliegh Ip

## 2014-12-04 NOTE — Consult Note (Signed)
PULMONARY / CRITICAL CARE MEDICINE   Name: Vincent Black MRN: [id number] DOB: 17-Jul-1971    ADMISSION DATE:  12/04/2014 CONSULTATION DATE:  12/04/2014  REFERRING MD :  Donne Hazel  CHIEF COMPLAINT:  Abd pain  INITIAL PRESENTATION:  44 y.o. M with Crohn's and C.diff brought to [facility] 4/25 for worsening abdominal pain.  Found to have perforated sigmoid colon, perforated cecal toxic megacolon, and small bowel fistula.  He was taken to the OR for ex lap and small bowel resection with anastomosis, total abdominal colectomy with end ileostomy.  Due to significant stool contamination, he was transferred to the ICU post op for close monitoring overnight.  STUDIES:  CXR 4/25 >>> extensive free intraperitoneal air. AXR 4/25 >>> large pneumoperitoneum c/w bowel perforation.  Small bowel and colonic distention, LLQ percutaneous abscess drain.  SIGNIFICANT EVENTS: 4/25 - admitted for severe abd pain.  Underwent ex lap with SB resection and total abdominal colectomy with end ileostomy.   HISTORY OF PRESENT ILLNESS:  Vincent Black is a 44 y.o. M with PMH as outlined below including Crohn's and recent C.diff colitis complicated by pericolonic abscess requiring drain placement 10/27/14.  He had recent admission and was discharged on 3/30 after being treated for severe sepsis due to intra abdominal abscess.  ID had been following him and he had recommended that he continue cipro and oral vanc until repeat CT which was scheduled for 12/05/14.    He saw Dr. Benson Norway on 4/25 for sudden hematemesis, generalized weakness, and RUQ pain.  Symptoms started suddnely and pt was throwing up massive amounts of gastric contents with blood in it.  He was instructed to come to [facility] for admission and further evaluation.  He had 10/10 pain in RLQ and pt stated that it felt exactly the same as the prior abscess.  Imaging revealed extensive free intraperitoneal air.  He was taken to the OR and was found to have perforated sigmoid colon,  perforated cecal toxic megacolon, and small bowel fistula.  He had ex lap and small bowel resection with anastomosis, total abdominal colectomy with end ileostomy.  Due to significant stool contamination throughout abdominal cavity, he was transferred to the ICU for close monitoring overnight.  Of note, his outpatient medication regimen consisted of PO cipro, flagyl, and vanc for C.Diff and pt was being followed by ID.  We will hold PO abx for now given his total colectomy and will have day team consult ID for further guidance on abx regimen.  PAST MEDICAL HISTORY :   has a past medical history of Crohn disease; C. difficile colitis (09/30/2014); Ankylosing spondylitis (09/30/2014); and Anxiety.  has past surgical history that includes Flexible sigmoidoscopy (N/A, 09/29/2014). Prior to Admission medications   Medication Sig Start Date End Date Taking? Authorizing Provider  ALPRAZolam (XANAX) 0.25 MG tablet Take 0.25 mg by mouth 3 (three) times daily as needed for anxiety.    Historical Provider, MD  ciprofloxacin (CIPRO) 500 MG tablet Take 1 tablet (500 mg total) by mouth 2 (two) times daily. 11/08/14   Reyne Dumas, MD  fluconazole (DIFLUCAN) 100 MG tablet Take 1 tablet (100 mg total) by mouth daily. 11/14/14   Michel Bickers, MD  methocarbamol (ROBAXIN) 500 MG tablet Take 2 tablets (1,000 mg total) by mouth every 8 (eight) hours as needed for muscle spasms. 11/08/14   Reyne Dumas, MD  metroNIDAZOLE (FLAGYL) 500 MG tablet Take 1 tablet (500 mg total) by mouth every 8 (eight) hours. 11/15/14   Michel Bickers, MD  morphine (MS CONTIN) 30 MG 12 hr tablet Take 1 tablet (30 mg total) by mouth every 12 (twelve) hours. 10/10/14   Carol Ada, MD  oxyCODONE-acetaminophen (PERCOCET/ROXICET) 5-325 MG per tablet Take 1 tablet by mouth every 6 (six) hours as needed for moderate pain or severe pain.    Historical Provider, MD  pantoprazole (PROTONIX) 40 MG tablet Take 1 tablet (40 mg total) by mouth daily. 11/08/14   Reyne Dumas, MD  Probiotic Product (PROBIOTIC PO) Take 1 tablet by mouth daily. Chewable    Historical Provider, MD  promethazine (PHENERGAN) 6.25 MG/5ML syrup Take 12.5 to 25 mg every 6 hours as needed for nausea. 11/15/14   Michel Bickers, MD  vancomycin (VANCOCIN) 50 mg/mL oral solution Take 2.5 mLs (125 mg total) by mouth every 6 (six) hours. 11/08/14   Reyne Dumas, MD   No Known Allergies  FAMILY HISTORY:  History reviewed. No pertinent family history.  SOCIAL HISTORY:  reports that he has quit smoking. His smoking use included Cigarettes. He has a 5 pack-year smoking history. He has never used smokeless tobacco. He reports that he drinks alcohol. He reports that he does not use illicit drugs.  REVIEW OF SYSTEMS:  All negative; except for those that are bolded, which indicate positives.  Constitutional: weight loss, weight gain, night sweats, fevers, chills, fatigue, weakness.  HEENT: headaches, sore throat, sneezing, nasal congestion, post nasal drip, difficulty swallowing, tooth/dental problems, visual complaints, visual changes, ear aches. Neuro: difficulty with speech, weakness, numbness, ataxia. CV:  chest pain, orthopnea, PND, swelling in lower extremities, dizziness, palpitations, syncope.  Resp: cough, hemoptysis, dyspnea, wheezing. GI  heartburn, indigestion, abdominal pain, nausea, vomiting, diarrhea, constipation, change in bowel habits, loss of appetite, hematemesis, melena, hematochezia.  GU: dysuria, change in color of urine, urgency or frequency, flank pain, hematuria. MSK: joint pain or swelling, decreased range of motion. Psych: change in mood or affect, depression, anxiety, suicidal ideations, homicidal ideations. Skin: rash, itching, bruising.   SUBJECTIVE:  Post op pain, otherwise no complaints.  VITAL SIGNS: Temp:  [97.2 F (36.2 C)-97.9 F (36.6 C)] 97.3 F (36.3 C) (04/25 2230) Pulse Rate:  [120-144] 129 (04/25 2230) Resp:  [11-22] 13 (04/25 2305) BP:  (96-156)/(70-114) 120/84 mmHg (04/25 2230) SpO2:  [95 %-100 %] 100 % (04/25 2230) Arterial Line BP: (98-128)/(60-73) 128/73 mmHg (04/25 2230) Weight:  [65.1 kg (143 lb 8.3 oz)] 65.1 kg (143 lb 8.3 oz) (04/25 1545) HEMODYNAMICS:   VENTILATOR SETTINGS:   INTAKE / OUTPUT: Intake/Output      04/25 0701 - 04/26 0700   I.V. (mL/kg) 3000 (46.1)   IV Piggyback 50   Total Intake(mL/kg) 3050 (46.9)   Urine (mL/kg/hr) 160   Emesis/NG output 300   Drains 195   Total Output 655   Net +2395         PHYSICAL EXAMINATION: General: Young male, in NAD. Neuro: A&O x 3, non-focal.  HEENT: [place]/AT. PERRL, sclerae anicteric. Cardiovascular: Tachy, regular, no M/R/G.  Lungs: Respirations even and unlabored.  CTA bilaterally, No W/R/R. Abdomen: BS absent.  Abd dressing in place and C/D/I.  Left sided JP drain and ileostomy in place.  Abd soft and appropriately tender post op. Musculoskeletal: No gross deformities, no edema.  Skin: Intact, warm, no rashes.  LABS:  CBC No results for input(s): WBC, HGB, HCT, PLT in the last 168 hours. Coag's  Recent Labs Lab 12/04/14 1625  APTT 35  INR 1.52*   BMET  Recent Labs Lab 12/04/14 1625  NA 130*  K 3.6  CL 94*  CO2 21  BUN 6  CREATININE 0.79  GLUCOSE 213*   Electrolytes  Recent Labs Lab 12/04/14 1625 12/04/14 1650  CALCIUM 7.9*  --   MG  --  1.3*  PHOS  --  3.6   Sepsis Markers  Recent Labs Lab 12/04/14 1650  LATICACIDVEN 4.1*   ABG No results for input(s): PHART, PCO2ART, PO2ART in the last 168 hours. Liver Enzymes  Recent Labs Lab 12/04/14 1625  AST 26  ALT 8  ALKPHOS 111  BILITOT 0.9  ALBUMIN 1.8*   Cardiac Enzymes No results for input(s): TROPONINI, PROBNP in the last 168 hours. Glucose No results for input(s): GLUCAP in the last 168 hours.  Imaging Dg Chest 1 View  12/04/2014   CLINICAL DATA:  Fever. Nasogastric tube placement. Crohn disease. Anxiety.  EXAM: CHEST  1 VIEW  COMPARISON:  CT of 11/17/2014   FINDINGS: Nasogastric terminates at the body of the stomach. Suspect remote anterior right rib trauma including at the second and possibly the third anterior right ribs. Midline trachea. Normal heart size. No pleural fluid. Low lung volumes. Patchy bibasilar atelectasis. Free intraperitoneal air under both hemidiaphragms.  IMPRESSION: 1. Appropriate position of nasogastric tube. 2. Extensive free intraperitoneal air. Critical test results telephoned toMaggie, r.n. at the time of interpretation at 4:40 p.m.on . 12/04/2014.   Electronically Signed   By: Abigail Miyamoto M.D.   On: 12/04/2014 16:38   Dg Chest Port 1 View  12/04/2014   CLINICAL DATA:  Central line and NG tube placement.  EXAM: PORTABLE CHEST - 1 VIEW  COMPARISON:  Earlier same day  FINDINGS: Grossly unchanged cardiac silhouette and mediastinal contours. Interval placement of a right jugular approach intravenous catheter with tip projected of the superior cavoatrial junction. Enteric tube tip and side port projects over the expected location of the gastric antrum. No pneumothorax. Apparent resolution of previously noted pneumoperitoneum. Improved aeration of lung bases without focal airspace opacity. No pleural effusion or pneumothorax. Unchanged bones.  IMPRESSION: 1. Appropriately positioned support apparatus as above. No pneumothorax. 2. Apparent resolution of previously noted large amount of pneumoperitoneum. 3. Improved aeration lungs with resolved bibasilar atelectasis.   Electronically Signed   By: Sandi Mariscal M.D.   On: 12/04/2014 21:19   Dg Abd Portable 1v  12/04/2014   CLINICAL DATA:  Nasogastric tube placement.  EXAM: PORTABLE ABDOMEN - 1 VIEW  COMPARISON:  Abdominal CT 11/17/2014  FINDINGS: There is large pneumoperitoneum with Rigler's sign and outlining of the falciform ligament. The colon is distended with high-density material, likely retained contrast from comparison CT. Small bowel dilatation.  Nasogastric tube is in good position.  There is a left lower quadrant percutaneous abscess drain.  Critical Value/emergent results were called by telephone at the time of interpretation on 12/04/2014 at 4:50 pm to Dr. [name] , who verbally acknowledged these results.  IMPRESSION: 1. Large pneumoperitoneum consistent with bowel perforation. 2. Small bowel and colonic distention which could be from low colonic obstruction or ileus. 3. Nasogastric tube is in good position. 4. Left lower quadrant percutaneous abscess drain.   Electronically Signed   By: Monte Fantasia M.D.   On: 12/04/2014 16:51    ASSESSMENT / PLAN:  GASTROINTESTINAL A:   Perforated sigmoid colon, perforated cecal toxic megacolon, and small bowel fistula - s/p ex lap and SBR with anastomosis, total abdominal colectomy with end ileostomy (4/25 - Dr. Donne Hazel) GI prophylaxis Nutrition Hx Crohn's, C.diff (was  on PO vanc, flagyl, cipro and was being followed by ID) P:   Post op care per [facility]. SUP: Pantoprazole. NPO. Nutrition timing per [facility]. Hold off on PO abx for now.  Day team to please consult ID for further direction on abx regimen.  CARDIOVASCULAR CVL R IJ 4/25 >>> R radial A line 4/25 >>> A:  Severe sepsis due to perforated sigmoid colon, perforated cecal toxic megacolon, and small bowel fistula - s/p ex lap and SBR with anastomosis, total abdominal colectomy with end ileostomy (4/25 - Dr. Donne Hazel) Sinus tachycardia - likely due to hypovolemia + pain P:  Goal MAP > 65. Goal CVP >10. IVF boluses PRN. Trend lactate.  PULMONARY A: At risk post op atelectasis P:   Pulmonary hygiene. Intermittent CXR.  RENAL A:   Hyponatremia Hypomagnesemia - repleted 2mg  Pseudohypocalcemia - corrects to 8.86 P:   NS @ 125. Send ionized calcium. BMP in AM.  HEMATOLOGIC A:   Anemia - chronic Thrombocytosis - chronic VTE Prophylaxis P:  Transfuse for Hgb < 7. SCD's / Heparin. CBC in AM.  INFECTIOUS A:   Severe sepsis due to perforated sigmoid  colon, perforated cecal toxic megacolon, and small bowel fistula - s/p ex lap and SBR with anastomosis, total abdominal colectomy with end ileostomy (4/25 - Dr. Donne Hazel) Hx C.diff - was on PO cipro , flagyl, vanc P:   BCx2 4/25 > UCx 4/25 > Wound Cx 4/25 > Anaerobic Cx 4/25 > Abx: Vanc, start date 4/25, day 1/x. Abx: Zosyn, start date 4/25, day 1/x. Antifungal:  Diflucan, start date 4/25, day 1/x. Hold PO abx for now, day team to please consult ID in AM for further direction on abx regimen.  ENDOCRINE A:   Hyperglycemia P:   SSI if glucose consistently > 180.  NEUROLOGIC A:   Post op pain Hx Anxiety, chronic pain P:   Fentanyl 25 - 100mg  q2hrs PRN. Continue outpatient robaxin. Hold outpatient alprazolam, MS contin, percocet.   Family updated: None.  Interdisciplinary Family Meeting v Palliative Care Meeting:  Due by: 5/2.   Montey Hora, [facility] Pager: [phone]  or [phone] 12/04/2014, 11:15 PM

## 2014-12-04 NOTE — H&P (Addendum)
[facility] History and Physical  Vincent Black NUU:[id number] DOB: 08-05-1971 DOA: 12/04/2014  Referring physician: Direct admission from Dr. Benson Black office  PCP:   Chief Complaint:   HPI:  Pt is 44 yo male with known history of Crohn's disease, several recent admissions in March 2016, discharged on March 1st, 2016 after being treated severe C. difficile colitis complicated by pericolonic abscess requiring drain placement on 10/27/14 and positive for Citrobacter and Klebsiella, most recent discharge on 3/30 after being treated for severe sepsis secondary to intra-abd abscess. Pt has seen Dr. Megan Black for management of ABX on 11/21/2014, was advised to continue taking antibiotics ciprofloxacin and oral vancomycin until repeat CT abdomen done which was scheduled be done on 12/05/2014. Last CT abdomen was done on 11/17/2014 and showed abscess cavity collapsed. Patient saw Dr. Benson Black his gastroenterologist earlier in a day for evaluation of sudden onset of hematemesis, generalized weakness, right upper quadrant area pain. Patient explains this started very suddenly, started to throw up massive amounts of gastric content with blood in it. Please note that patient is too weak to provide full history and father and patient's wife at bedside provided details. No reported fevers or chills, pain in the right upper and middle quadrants is constant and sharp, 10 /10 in severity, worse with eating, no specific alleviating factors, radiating to epigastric area. Patient describes pain similar to pain that he had with abscess last month. Patient is worried that this may be another abscess forming. Patient denies chest pain or shortness of breath but feels weak and tired.   Pt arrived to 6N but on exam looks very pale and sick, HR in 140's and significant abd discomfort. Transfer to SDU STAT, requested CT abd STAT and started Zosyn IV. Surgery consulted and notified doctor in e-link.   Assessment and  Plan: Principal Problem:   ? Sepsis - clinical symptoms and HR in 140's worrisome for underlying sepsis, blood work pending  - from likely intraabdominal process - sepsis order set in place - empiric zosyn for now  - follow up on blood cultures, lactic acid, procalcitonin level, CXR requested  - surgery consulted    Abdominal pain, severe and acute, with nausea and bloody vomiting  - Certainly worrisome for development of another abscess vs obstruction/perforation - Plan to obtain CT abdomen pelvis with contrast - holding off on oral vanc for now and started Zosyn - We'll place PICC line - need NGT to be placed STAT Active Problems:   Protein-calorie malnutrition, severe - In the setting of acute on chronic illness, recent intra-abdominal abscesses requiring drainage - We'll keep nothing by mouth for now   Crohn's disease - Follow up on GI recommendations   History of Clostridium difficile colitis - Per last note by Dr. Megan Black, 4/11, continuing antibiotics until repeat CT scan done - pt was on oral vanc and cipro, will hold both ABX for now and will place on Zosyn - call ID in AM to discuss continuing oral vanc    DVT prophylaxis - Lovenox SQ   ADDENDUM: CXR with massive pneumoperitoneum. Surgery team notified.   Radiological Exams on Admission: No results found.   Code Status: Full Family Communication: Pt, father and pt's wife at bedside Disposition Plan: Admit for further evaluation    Vincent Black Vincent Black [phone]   Review of Systems:  Constitutional: Negative for diaphoresis.  HENT: Negative for hearing loss, ear pain, nosebleeds, congestion, sore throat, neck pain, tinnitus and ear discharge.   Eyes: Negative  for blurred vision, double vision, photophobia, pain, discharge and redness.  Respiratory: Negative for cough, hemoptysis, sputum production, shortness of breath, wheezing and stridor.   Cardiovascular: Negative for chest pain, palpitations, orthopnea,  claudication and leg swelling.  Gastrointestinal: Per HPI Genitourinary: Negative for dysuria, urgency, frequency, hematuria and flank pain.  Musculoskeletal: Negative for myalgias, back pain, joint pain and falls.  Skin: Negative for itching and rash.  Neurological: Negative for dizziness Endo/Heme/Allergies: Negative for environmental allergies and polydipsia. Does not bruise/bleed easily.  Psychiatric/Behavioral: Negative for suicidal ideas. The patient is not nervous/anxious.      Past Medical History  Diagnosis Date  . Crohn disease   . C. difficile colitis 09/30/2014  . Ankylosing spondylitis 09/30/2014  . Anxiety     Past Surgical History  Procedure Laterality Date  . Flexible sigmoidoscopy N/A 09/29/2014    Procedure: FLEXIBLE SIGMOIDOSCOPY;  Surgeon: Beryle Beams, MD;  Location: [place];  Service: Endoscopy;  Laterality: N/A;    Social History:  reports that he has quit smoking. His smoking use included Cigarettes. He has a 5 pack-year smoking history. He has never used smokeless tobacco. He reports that he drinks alcohol. He reports that he does not use illicit drugs.  No Known Allergies Family history of HTN and HLD   Prior to Admission medications   Medication Sig Start Date End Date Taking? Authorizing Provider  ALPRAZolam (XANAX) 0.25 MG tablet Take 0.25 mg by mouth 3 (three) times daily as needed for anxiety.    Historical Provider, MD  ciprofloxacin (CIPRO) 500 MG tablet Take 1 tablet (500 mg total) by mouth 2 (two) times daily. 11/08/14   Reyne Dumas, MD  fluconazole (DIFLUCAN) 100 MG tablet Take 1 tablet (100 mg total) by mouth daily. 11/14/14   Michel Bickers, MD  methocarbamol (ROBAXIN) 500 MG tablet Take 2 tablets (1,000 mg total) by mouth every 8 (eight) hours as needed for muscle spasms. 11/08/14   Reyne Dumas, MD  metroNIDAZOLE (FLAGYL) 500 MG tablet Take 1 tablet (500 mg total) by mouth every 8 (eight) hours. 11/15/14   Michel Bickers, MD  morphine (MS  CONTIN) 30 MG 12 hr tablet Take 1 tablet (30 mg total) by mouth every 12 (twelve) hours. 10/10/14   Carol Ada, MD  oxyCODONE-acetaminophen (PERCOCET/ROXICET) 5-325 MG per tablet Take 1 tablet by mouth every 6 (six) hours as needed for moderate pain or severe pain.    Historical Provider, MD  pantoprazole (PROTONIX) 40 MG tablet Take 1 tablet (40 mg total) by mouth daily. 11/08/14   Reyne Dumas, MD  Probiotic Product (PROBIOTIC PO) Take 1 tablet by mouth daily. Chewable    Historical Provider, MD  promethazine (PHENERGAN) 6.25 MG/5ML syrup Take 12.5 to 25 mg every 6 hours as needed for nausea. 11/15/14   Michel Bickers, MD  vancomycin (VANCOCIN) 50 mg/mL oral solution Take 2.5 mLs (125 mg total) by mouth every 6 (six) hours. 11/08/14   Reyne Dumas, MD    Physical Exam: There were no vitals filed for this visit.  Physical Exam  Constitutional: In  moderate distress  HENT: External right and left ear normal. Dry MM Eyes: Conjunctivae and EOM are normal. PERRLA, no scleral icterus.  Neck: Normal ROM. Neck supple. No JVD. No tracheal deviation. No thyromegaly.  CVS: Regular rhythm, rate 140 bpm, S1/S2 +, no murmurs, no gallops, no carotid bruit.  Pulmonary: Effort and breath sounds normal, RR in mid 20's with diminished breath sounds at bases  Abdominal: Severe TTP with  very mild palpation with guarding Musculoskeletal: No edema and no tenderness.  Lymphadenopathy: No lymphadenopathy noted, cervical, inguinal.Limited exam due to pt's discomfort.  Neuro: Alert. No evidence of focal neurological findings but exam limited due to pt's distress  Skin: Skin is dry, clammy Psychiatric: Appears very tired and difficult to assess psych status due to distress.   Labs on Admission:  Basic Metabolic Panel: No results for input(s): NA, K, CL, CO2, GLUCOSE, BUN, CREATININE, CALCIUM, MG, PHOS in the last 168 hours. Liver Function Tests: No results for input(s): AST, ALT, ALKPHOS, BILITOT, PROT, ALBUMIN in the  last 168 hours. No results for input(s): LIPASE, AMYLASE in the last 168 hours. No results for input(s): AMMONIA in the last 168 hours. CBC: No results for input(s): WBC, NEUTROABS, HGB, HCT, MCV, PLT in the last 168 hours. Cardiac Enzymes: No results for input(s): CKTOTAL, CKMB, CKMBINDEX, TROPONINI in the last 168 hours. BNP: Invalid input(s): POCBNP CBG: No results for input(s): GLUCAP in the last 168 hours.  EKG: pending Time spent 90 min   If 7PM-7AM, please contact night-coverage [email or web address] Password [email or web address] 12/04/2014, 1:53 PM

## 2014-12-04 NOTE — Progress Notes (Signed)
Mag level low, 1.3, will order 2 gm Mg IV. Repeat Mg level in AM.  Faye Ramsay, MD  [facility] Pager [phone]  If 7PM-7AM, please contact night-coverage [email or web address] Password [email or web address]

## 2014-12-04 NOTE — Progress Notes (Signed)
ANTIBIOTIC CONSULT NOTE - INITIAL  Pharmacy Consult for vancomycin Indication: rule out sepsis  No Known Allergies  Patient Measurements: Height: 5\' 9"  (175.3 cm) Weight: 147 lb 7.8 oz (66.9 kg) IBW/kg (Calculated) : 70.7  Vital Signs: Temp: 97.3 F (36.3 C) (04/25 2230) Temp Source: Oral (04/25 1430) BP: 120/84 mmHg (04/25 2230) Pulse Rate: 129 (04/25 2230)  Labs:  Recent Labs  12/04/14 1625  CREATININE 0.79   Estimated Creatinine Clearance: 113.8 mL/min (by C-G formula based on Cr of 0.79).   Microbiology: Recent Results (from the past 720 hour(s))  Culture, blood (x 2)     Status: None (Preliminary result)   Collection Time: 12/04/14  4:50 PM  Result Value Ref Range Status   Specimen Description BLOOD LEFT HAND  Final   Special Requests BOTTLES DRAWN AEROBIC ONLY 2CC  Final   Culture PENDING  Incomplete   Report Status PENDING  Incomplete  MRSA PCR Screening     Status: None   Collection Time: 12/04/14  5:32 PM  Result Value Ref Range Status   MRSA by PCR NEGATIVE NEGATIVE Final    Comment:        The GeneXpert MRSA Assay (FDA approved for NASAL specimens only), is one component of a comprehensive MRSA colonization surveillance program. It is not intended to diagnose MRSA infection nor to guide or monitor treatment for MRSA infections.     Medical History: Past Medical History  Diagnosis Date  . Crohn disease   . C. difficile colitis 09/30/2014  . Ankylosing spondylitis 09/30/2014  . Anxiety     Medications:  Prescriptions prior to admission  Medication Sig Dispense Refill Last Dose  . ALPRAZolam (XANAX) 0.25 MG tablet Take 0.25 mg by mouth 3 (three) times daily as needed for anxiety.   1/16  . ciprofloxacin (CIPRO) 500 MG tablet Take 1 tablet (500 mg total) by mouth 2 (two) times daily. 30 tablet 0 Taking  . fluconazole (DIFLUCAN) 100 MG tablet Take 1 tablet (100 mg total) by mouth daily. 14 tablet 1 Taking  . methocarbamol (ROBAXIN) 500 MG  tablet Take 2 tablets (1,000 mg total) by mouth every 8 (eight) hours as needed for muscle spasms. 30 tablet 0   . metroNIDAZOLE (FLAGYL) 500 MG tablet Take 1 tablet (500 mg total) by mouth every 8 (eight) hours. 90 tablet 1   . morphine (MS CONTIN) 30 MG 12 hr tablet Take 1 tablet (30 mg total) by mouth every 12 (twelve) hours. 60 tablet 0 10/25/2014  . oxyCODONE-acetaminophen (PERCOCET/ROXICET) 5-325 MG per tablet Take 1 tablet by mouth every 6 (six) hours as needed for moderate pain or severe pain.   10/25/2014  . pantoprazole (PROTONIX) 40 MG tablet Take 1 tablet (40 mg total) by mouth daily. 30 tablet 0   . Probiotic Product (PROBIOTIC PO) Take 1 tablet by mouth daily. Chewable   10/25/2014  . promethazine (PHENERGAN) 6.25 MG/5ML syrup Take 12.5 to 25 mg every 6 hours as needed for nausea. 240 mL 2   . vancomycin (VANCOCIN) 50 mg/mL oral solution Take 2.5 mLs (125 mg total) by mouth every 6 (six) hours. 10 mL 0 Taking   Scheduled:  . [START ON 12/05/2014] fluconazole (DIFLUCAN) IV  200 mg Intravenous Q24H  . [START ON 12/05/2014] heparin subcutaneous  5,000 Units Subcutaneous 3 times per day  . HYDROmorphone      . HYDROmorphone      . magnesium sulfate 1 - 4 g bolus IVPB  2 g Intravenous  Once  . pantoprazole (PROTONIX) IV  40 mg Intravenous Q24H  . piperacillin-tazobactam (ZOSYN)  IV  3.375 g Intravenous Q8H   Infusions:  . sodium chloride 125 mL/hr at 12/04/14 1530    Assessment: 44yo male w/ h/o Crohn's, most recent hospital discharge 3/30 after severe sepsis 2/2 intraabdominal abscess, now c/o sudden onset of RUQ pain, generalized weakness, and hematemesis, admitted to MICU after ex lap and total colectomy, to begin IV ABX for sepsis.  Goal of Therapy:  Vancomycin trough level 15-20 mcg/ml  Plan:  Will begin vancomycin 1000mg  IV Q8H and monitor CBC, Cx, levels prn.  Wynona Neat, PharmD, BCPS  12/04/2014,11:50 PM

## 2014-12-04 NOTE — Progress Notes (Addendum)
Pt arrived to 3S02, looks ill, ST 130s, BP 148/105, admin zofran & dilaudid. Paged DR for plan, paged RR to assist. Will continue to monitor. CCS consult just arrived. elink involved as well.

## 2014-12-04 NOTE — Anesthesia Postprocedure Evaluation (Signed)
Anesthesia Post Note  Patient: Vincent Black  Procedure(s) Performed: Procedure(s) (LRB): EXPLORATORY LAPAROTOMY, Total Colectomy (N/A)  Anesthesia type: General  Patient location: PACU  Post pain: Pain level controlled  Post assessment: Post-op Vital signs reviewed  Last Vitals: BP 101/71 mmHg  Pulse 128  Temp(Src) 36.2 C (Oral)  Resp 16  Ht 5\' 9"  (1.753 m)  Wt 143 lb 8.3 oz (65.1 kg)  BMI 21.18 kg/m2  SpO2 100%  Post vital signs: Reviewed  Level of consciousness: sedated  Complications: No apparent anesthesia complications

## 2014-12-04 NOTE — Progress Notes (Signed)
ANTIBIOTIC CONSULT NOTE - INITIAL  Pharmacy Consult for zosyn Indication: intraabdominal infection  No Known Allergies  Vital Signs: Temp: 97.9 F (36.6 C) (04/25 1430) Temp Source: Oral (04/25 1430) BP: 147/104 mmHg (04/25 1430) Pulse Rate: 144 (04/25 1430) Intake/Output from previous day:   Intake/Output from this shift:    Labs: No results for input(s): WBC, HGB, PLT, LABCREA, CREATININE in the last 72 hours. CrCl cannot be calculated (Patient has no serum creatinine result on file.). No results for input(s): VANCOTROUGH, VANCOPEAK, VANCORANDOM, GENTTROUGH, GENTPEAK, GENTRANDOM, TOBRATROUGH, TOBRAPEAK, TOBRARND, AMIKACINPEAK, AMIKACINTROU, AMIKACIN in the last 72 hours.   Microbiology: No results found for this or any previous visit (from the past 720 hour(s)).  Medical History: Past Medical History  Diagnosis Date  . Crohn disease   . C. difficile colitis 09/30/2014  . Ankylosing spondylitis 09/30/2014  . Anxiety     Medications:  Prescriptions prior to admission  Medication Sig Dispense Refill Last Dose  . ALPRAZolam (XANAX) 0.25 MG tablet Take 0.25 mg by mouth 3 (three) times daily as needed for anxiety.   1/16  . ciprofloxacin (CIPRO) 500 MG tablet Take 1 tablet (500 mg total) by mouth 2 (two) times daily. 30 tablet 0 Taking  . fluconazole (DIFLUCAN) 100 MG tablet Take 1 tablet (100 mg total) by mouth daily. 14 tablet 1 Taking  . methocarbamol (ROBAXIN) 500 MG tablet Take 2 tablets (1,000 mg total) by mouth every 8 (eight) hours as needed for muscle spasms. 30 tablet 0   . metroNIDAZOLE (FLAGYL) 500 MG tablet Take 1 tablet (500 mg total) by mouth every 8 (eight) hours. 90 tablet 1   . morphine (MS CONTIN) 30 MG 12 hr tablet Take 1 tablet (30 mg total) by mouth every 12 (twelve) hours. 60 tablet 0 10/25/2014  . oxyCODONE-acetaminophen (PERCOCET/ROXICET) 5-325 MG per tablet Take 1 tablet by mouth every 6 (six) hours as needed for moderate pain or severe pain.   10/25/2014   . pantoprazole (PROTONIX) 40 MG tablet Take 1 tablet (40 mg total) by mouth daily. 30 tablet 0   . Probiotic Product (PROBIOTIC PO) Take 1 tablet by mouth daily. Chewable   10/25/2014  . promethazine (PHENERGAN) 6.25 MG/5ML syrup Take 12.5 to 25 mg every 6 hours as needed for nausea. 240 mL 2   . vancomycin (VANCOCIN) 50 mg/mL oral solution Take 2.5 mLs (125 mg total) by mouth every 6 (six) hours. 10 mL 0 Taking   Assessment: 44 yo man to start zosyn for intraabdominal infection.  His baseline labs are pending.  Goal of Therapy:  Eradication of infection  Plan:  Zosyn 3.375 gm IV q8 hours F/u baseline labs.  Vincent Black 12/04/2014,3:10 PM

## 2014-12-04 NOTE — Telephone Encounter (Signed)
Patient's wife called, wanting to know when he can reschedule last week's missed appointment. Per wife, patient was up all night with pain, slept through his appointment 4/21.  Please advise. Landis Gandy, RN

## 2014-12-05 ENCOUNTER — Encounter (HOSPITAL_COMMUNITY): Payer: Self-pay | Admitting: General Surgery

## 2014-12-05 ENCOUNTER — Inpatient Hospital Stay (HOSPITAL_COMMUNITY): Admission: RE | Admit: 2014-12-05 | Payer: 59 | Source: Ambulatory Visit

## 2014-12-05 DIAGNOSIS — K50913 Crohn's disease, unspecified, with fistula: Secondary | ICD-10-CM

## 2014-12-05 DIAGNOSIS — E876 Hypokalemia: Secondary | ICD-10-CM

## 2014-12-05 DIAGNOSIS — F411 Generalized anxiety disorder: Secondary | ICD-10-CM | POA: Diagnosis present

## 2014-12-05 DIAGNOSIS — R000 Tachycardia, unspecified: Secondary | ICD-10-CM | POA: Diagnosis present

## 2014-12-05 DIAGNOSIS — A0472 Enterocolitis due to Clostridium difficile, not specified as recurrent: Secondary | ICD-10-CM | POA: Diagnosis present

## 2014-12-05 DIAGNOSIS — D473 Essential (hemorrhagic) thrombocythemia: Secondary | ICD-10-CM

## 2014-12-05 DIAGNOSIS — B3781 Candidal esophagitis: Secondary | ICD-10-CM

## 2014-12-05 DIAGNOSIS — K631 Perforation of intestine (nontraumatic): Secondary | ICD-10-CM | POA: Diagnosis present

## 2014-12-05 DIAGNOSIS — I471 Supraventricular tachycardia: Secondary | ICD-10-CM

## 2014-12-05 DIAGNOSIS — K50918 Crohn's disease, unspecified, with other complication: Secondary | ICD-10-CM

## 2014-12-05 DIAGNOSIS — D72829 Elevated white blood cell count, unspecified: Secondary | ICD-10-CM | POA: Diagnosis present

## 2014-12-05 DIAGNOSIS — E871 Hypo-osmolality and hyponatremia: Secondary | ICD-10-CM

## 2014-12-05 DIAGNOSIS — A047 Enterocolitis due to Clostridium difficile: Secondary | ICD-10-CM

## 2014-12-05 DIAGNOSIS — K50914 Crohn's disease, unspecified, with abscess: Secondary | ICD-10-CM

## 2014-12-05 DIAGNOSIS — R739 Hyperglycemia, unspecified: Secondary | ICD-10-CM

## 2014-12-05 DIAGNOSIS — R6521 Severe sepsis with septic shock: Secondary | ICD-10-CM

## 2014-12-05 DIAGNOSIS — K632 Fistula of intestine: Secondary | ICD-10-CM

## 2014-12-05 DIAGNOSIS — R509 Fever, unspecified: Secondary | ICD-10-CM

## 2014-12-05 DIAGNOSIS — A419 Sepsis, unspecified organism: Secondary | ICD-10-CM | POA: Diagnosis present

## 2014-12-05 DIAGNOSIS — E872 Acidosis, unspecified: Secondary | ICD-10-CM | POA: Diagnosis present

## 2014-12-05 DIAGNOSIS — Z932 Ileostomy status: Secondary | ICD-10-CM

## 2014-12-05 DIAGNOSIS — K5931 Toxic megacolon: Secondary | ICD-10-CM | POA: Diagnosis present

## 2014-12-05 DIAGNOSIS — R1010 Upper abdominal pain, unspecified: Secondary | ICD-10-CM

## 2014-12-05 DIAGNOSIS — E46 Unspecified protein-calorie malnutrition: Secondary | ICD-10-CM

## 2014-12-05 DIAGNOSIS — K593 Megacolon, not elsewhere classified: Secondary | ICD-10-CM

## 2014-12-05 DIAGNOSIS — G894 Chronic pain syndrome: Secondary | ICD-10-CM | POA: Diagnosis present

## 2014-12-05 DIAGNOSIS — Z4659 Encounter for fitting and adjustment of other gastrointestinal appliance and device: Secondary | ICD-10-CM

## 2014-12-05 DIAGNOSIS — D638 Anemia in other chronic diseases classified elsewhere: Secondary | ICD-10-CM | POA: Diagnosis present

## 2014-12-05 DIAGNOSIS — Z79899 Other long term (current) drug therapy: Secondary | ICD-10-CM | POA: Diagnosis present

## 2014-12-05 DIAGNOSIS — Z9049 Acquired absence of other specified parts of digestive tract: Secondary | ICD-10-CM

## 2014-12-05 LAB — MAGNESIUM
MAGNESIUM: 1.1 mg/dL — AB (ref 1.5–2.5)
MAGNESIUM: 1.6 mg/dL (ref 1.5–2.5)

## 2014-12-05 LAB — LACTIC ACID, PLASMA
LACTIC ACID, VENOUS: 4.6 mmol/L — AB (ref 0.5–2.0)
LACTIC ACID, VENOUS: 3.7 mmol/L — AB (ref 0.5–2.0)
LACTIC ACID, VENOUS: 2.6 mmol/L — AB (ref 0.5–2.0)
Lactic Acid, Venous: 1.8 mmol/L (ref 0.5–2.0)

## 2014-12-05 LAB — BASIC METABOLIC PANEL
ANION GAP: 11 (ref 5–15)
ANION GAP: 9 (ref 5–15)
BUN: 9 mg/dL (ref 6–23)
BUN: 16 mg/dL (ref 6–23)
CALCIUM: 6.3 mg/dL — AB (ref 8.4–10.5)
CO2: 17 mmol/L — AB (ref 19–32)
CO2: 19 mmol/L (ref 19–32)
CREATININE: 1.09 mg/dL (ref 0.50–1.35)
Calcium: 6.7 mg/dL — ABNORMAL LOW (ref 8.4–10.5)
Chloride: 105 mmol/L (ref 96–112)
Chloride: 104 mmol/L (ref 96–112)
Creatinine, Ser: 1.82 mg/dL — ABNORMAL HIGH (ref 0.50–1.35)
GFR calc Af Amer: >90 mL/min (ref >90)
GFR calc Af Amer: 51 mL/min — ABNORMAL LOW (ref >90)
GFR calc non Af Amer: 82 mL/min — ABNORMAL LOW (ref >90)
GFR, EST NON AFRICAN AMERICAN: 44 mL/min — AB (ref >90)
GLUCOSE: 145 mg/dL — AB (ref 70–99)
Glucose, Bld: 191 mg/dL — ABNORMAL HIGH (ref 70–99)
POTASSIUM: 3.6 mmol/L (ref 3.5–5.1)
Potassium: 3.5 mmol/L (ref 3.5–5.1)
SODIUM: 133 mmol/L — AB (ref 135–145)
SODIUM: 132 mmol/L — AB (ref 135–145)

## 2014-12-05 LAB — CBC
HEMATOCRIT: 36.8 % — AB (ref 39.0–52.0)
Hemoglobin: 11.6 g/dL — ABNORMAL LOW (ref 13.0–17.0)
MCH: 27.8 pg (ref 26.0–34.0)
MCHC: 31.5 g/dL (ref 30.0–36.0)
MCV: 88 fL (ref 78.0–100.0)
PLATELETS: 401 10*3/uL — AB (ref 150–400)
RBC: 4.18 MIL/uL — ABNORMAL LOW (ref 4.22–5.81)
RDW: 16.6 % — ABNORMAL HIGH (ref 11.5–15.5)
WBC: 72.5 10*3/uL (ref 4.0–10.5)

## 2014-12-05 LAB — GLUCOSE, CAPILLARY: Glucose-Capillary: 108 mg/dL — ABNORMAL HIGH (ref 70–99)

## 2014-12-05 LAB — PHOSPHORUS
PHOSPHORUS: 3.8 mg/dL (ref 2.3–4.6)
Phosphorus: 3.2 mg/dL (ref 2.3–4.6)

## 2014-12-05 MED ORDER — SODIUM CHLORIDE 0.9 % IV SOLN
100.0000 mg | Freq: Every day | INTRAVENOUS | Status: DC
  Administered 2014-12-05: 100 mg via INTRAVENOUS
  Filled 2014-12-05 (×2): qty 100

## 2014-12-05 MED ORDER — ONDANSETRON HCL 4 MG/2ML IJ SOLN: 4.0000 mg | Freq: Four times a day (QID) | INTRAMUSCULAR | Status: DC | PRN

## 2014-12-05 MED ORDER — SODIUM CHLORIDE 0.45 % IV SOLN
INTRAVENOUS | Status: DC
  Administered 2014-12-05: 11:00:00 via INTRAVENOUS

## 2014-12-05 MED ORDER — FENTANYL CITRATE (PF) 100 MCG/2ML IJ SOLN: 50.0000 ug | INTRAMUSCULAR | Status: DC | PRN

## 2014-12-05 MED ORDER — LORAZEPAM 2 MG/ML IJ SOLN
0.5000 mg | Freq: Four times a day (QID) | INTRAMUSCULAR | Status: DC | PRN
  Administered 2014-12-07 – 2014-12-08 (×2): 1 mg via INTRAVENOUS
  Filled 2014-12-05 (×2): qty 1

## 2014-12-05 MED ORDER — POTASSIUM CHLORIDE 10 MEQ/100ML IV SOLN
10.0000 meq | INTRAVENOUS | Status: DC
  Filled 2014-12-05: qty 100

## 2014-12-05 MED ORDER — POTASSIUM CHLORIDE 10 MEQ/50ML IV SOLN
10.0000 meq | INTRAVENOUS | Status: AC
  Administered 2014-12-05 (×2): 10 meq via INTRAVENOUS
  Filled 2014-12-05: qty 50

## 2014-12-05 MED ORDER — NALOXONE HCL 0.4 MG/ML IJ SOLN: 0.4000 mg | INTRAMUSCULAR | Status: DC | PRN

## 2014-12-05 MED ORDER — PHENYLEPHRINE HCL 10 MG/ML IJ SOLN
0.0000 ug/min | INTRAVENOUS | Status: DC
  Administered 2014-12-05: 20 ug/min via INTRAVENOUS
  Filled 2014-12-05: qty 1

## 2014-12-05 MED ORDER — STERILE WATER FOR INJECTION IV SOLN
INTRAVENOUS | Status: DC
  Administered 2014-12-05 – 2014-12-06 (×2): via INTRAVENOUS
  Filled 2014-12-05 (×2): qty 9.7

## 2014-12-05 MED ORDER — SODIUM CHLORIDE 0.9 % IV BOLUS (SEPSIS)
1000.0000 mL | Freq: Once | INTRAVENOUS | Status: AC
  Administered 2014-12-05: 1000 mL via INTRAVENOUS

## 2014-12-05 MED ORDER — MAGNESIUM SULFATE 2 GM/50ML IV SOLN
2.0000 g | Freq: Once | INTRAVENOUS | Status: AC
  Administered 2014-12-05: 2 g via INTRAVENOUS
  Filled 2014-12-05: qty 50

## 2014-12-05 MED ORDER — INSULIN ASPART 100 UNIT/ML ~~LOC~~ SOLN: 0.0000 [IU] | SUBCUTANEOUS | Status: DC

## 2014-12-05 MED ORDER — METOCLOPRAMIDE HCL 5 MG/ML IJ SOLN: 5.0000 mg | Freq: Two times a day (BID) | INTRAMUSCULAR | Status: DC

## 2014-12-05 MED ORDER — FENTANYL 10 MCG/ML IV SOLN
INTRAVENOUS | Status: DC
  Administered 2014-12-05: 16:00:00 via INTRAVENOUS
  Administered 2014-12-05: 150 ug via INTRAVENOUS
  Administered 2014-12-05: 13:00:00 via INTRAVENOUS
  Administered 2014-12-05: 45 ug via INTRAVENOUS
  Filled 2014-12-05: qty 50

## 2014-12-05 MED ORDER — CETYLPYRIDINIUM CHLORIDE 0.05 % MT LIQD
7.0000 mL | Freq: Two times a day (BID) | OROMUCOSAL | Status: DC
  Administered 2014-12-06 (×2): 7 mL via OROMUCOSAL

## 2014-12-05 MED ORDER — HYDROMORPHONE 0.3 MG/ML IV SOLN
INTRAVENOUS | Status: DC
  Administered 2014-12-05: 21:00:00 via INTRAVENOUS
  Administered 2014-12-06: 2.3 mg via INTRAVENOUS
  Administered 2014-12-06: 1.2 mg via INTRAVENOUS
  Administered 2014-12-06: 13:00:00 via INTRAVENOUS
  Administered 2014-12-06: 3.3 mg via INTRAVENOUS
  Administered 2014-12-06: 0 mg via INTRAVENOUS
  Administered 2014-12-07: 4.5 mg via INTRAVENOUS
  Administered 2014-12-07: 1.8 mg via INTRAVENOUS
  Administered 2014-12-07: 21:00:00 via INTRAVENOUS
  Administered 2014-12-07: 1.8 mg via INTRAVENOUS
  Administered 2014-12-07: 0 mg via INTRAVENOUS
  Administered 2014-12-07: 1.77 mg via INTRAVENOUS
  Administered 2014-12-07: 0 mg via INTRAVENOUS
  Filled 2014-12-05 (×4): qty 25

## 2014-12-05 MED ORDER — SODIUM CHLORIDE 0.9 % IJ SOLN: 9.0000 mL | INTRAMUSCULAR | Status: DC | PRN

## 2014-12-05 MED ORDER — BUTAMBEN-TETRACAINE-BENZOCAINE 2-2-14 % EX AERO
1.0000 | INHALATION_SPRAY | Freq: Three times a day (TID) | CUTANEOUS | Status: DC | PRN
  Filled 2014-12-05: qty 20

## 2014-12-05 MED ORDER — SODIUM CHLORIDE 0.9 % IV BOLUS (SEPSIS)
2000.0000 mL | Freq: Once | INTRAVENOUS | Status: AC
  Administered 2014-12-05: 2000 mL via INTRAVENOUS

## 2014-12-05 MED ORDER — HYDROMORPHONE HCL 1 MG/ML IJ SOLN
1.0000 mg | Freq: Once | INTRAMUSCULAR | Status: DC
  Filled 2014-12-05: qty 1

## 2014-12-05 MED ORDER — SODIUM CHLORIDE 0.9 % IV SOLN: 100.0000 mg | Freq: Every day | INTRAVENOUS | Status: DC

## 2014-12-05 MED ORDER — ALPRAZOLAM 0.25 MG PO TABS: 0.2500 mg | ORAL_TABLET | Freq: Three times a day (TID) | ORAL | Status: DC | PRN

## 2014-12-05 MED ORDER — DIPHENHYDRAMINE HCL 50 MG/ML IJ SOLN: 12.5000 mg | Freq: Four times a day (QID) | INTRAMUSCULAR | Status: DC | PRN

## 2014-12-05 MED ORDER — METRONIDAZOLE IN NACL 5-0.79 MG/ML-% IV SOLN
500.0000 mg | Freq: Three times a day (TID) | INTRAVENOUS | Status: DC
  Administered 2014-12-05 – 2014-12-08 (×9): 500 mg via INTRAVENOUS
  Filled 2014-12-05 (×11): qty 100

## 2014-12-05 MED ORDER — DIPHENHYDRAMINE HCL 12.5 MG/5ML PO ELIX
12.5000 mg | ORAL_SOLUTION | Freq: Four times a day (QID) | ORAL | Status: DC | PRN
  Filled 2014-12-05: qty 5

## 2014-12-05 MED ORDER — SODIUM CHLORIDE 0.9 % IV SOLN
INTRAVENOUS | Status: DC
  Administered 2014-12-05: 20:00:00 via INTRAVENOUS
  Administered 2014-12-06: 75 mL/h via INTRAVENOUS
  Administered 2014-12-06: via INTRAVENOUS

## 2014-12-05 NOTE — Progress Notes (Signed)
Patient without any urine output.  Dr Titus Mould notified of urine output.  Patient given 2 liter NS bolus for output.  Stat BMP sent via order.  Will continue to monitor.

## 2014-12-05 NOTE — Progress Notes (Signed)
[facility] Progress Note  1 Day Post-Op  Subjective: Pt's pain significantly better, but tender over incision.  No N/V, NG tube in place.  Begging for ice chips.  Flatus and stool in bag.  Wife and mom and dad at bedside.    Objective: Vital signs in last 24 hours: Temp:  [97.2 F (36.2 C)-99.2 F (37.3 C)] 97.7 F (36.5 C) (04/26 0318) Pulse Rate:  [120-144] 129 (04/25 2230) Resp:  [11-22] 16 (04/26 0300) BP: (88-156)/(66-114) 88/66 mmHg (04/26 0700) SpO2:  [95 %-100 %] 100 % (04/25 2230) Arterial Line BP: (98-128)/(60-73) 128/73 mmHg (04/25 2230) Weight:  [65.1 kg (143 lb 8.3 oz)-66.9 kg (147 lb 7.8 oz)] 66.9 kg (147 lb 7.8 oz) (04/25 2300)    Intake/Output from previous day: 04/25 0701 - 04/26 0700 In: 4475 [I.V.:4075; IV Piggyback:400] Out: 755 [Urine:160; Emesis/NG output:300; Drains:295] Intake/Output this shift:    PE: Gen:  Alert, NAD, pleasant Card:  RRR, no M/G/R heard Pulm:  CTA, no W/R/R, good effort Abd: Soft, mildly distended, appropriately tender over midline incision and prior drain site, diminished BS, no HSM, midline incision clean and beefy red, minimal sanguinous drainage, blake drain in place in LLQ (at new site) with serosanguinous with small amount of cloudy output.     Lab Results:   Recent Labs  12/05/14 0415  WBC 72.5*  HGB 11.6*  HCT 36.8*  PLT 401*   BMET  Recent Labs  12/04/14 1625 12/05/14 0415  NA 130* 133*  K 3.6 3.5  CL 94* 105  CO2 21 17*  GLUCOSE 213* 191*  BUN 6 9  CREATININE 0.79 1.09  CALCIUM 7.9* 6.7*   PT/INR  Recent Labs  12/04/14 1625  LABPROT 18.4*  INR 1.52*   CMP     Component Value Date/Time   NA 133* 12/05/2014 0415   K 3.5 12/05/2014 0415   CL 105 12/05/2014 0415   CO2 17* 12/05/2014 0415   GLUCOSE 191* 12/05/2014 0415   BUN 9 12/05/2014 0415   CREATININE 1.09 12/05/2014 0415   CALCIUM 6.7* 12/05/2014 0415   PROT 5.9* 12/04/2014 1625   ALBUMIN 1.8* 12/04/2014 1625   AST 26  12/04/2014 1625   ALT 8 12/04/2014 1625   ALKPHOS 111 12/04/2014 1625   BILITOT 0.9 12/04/2014 1625   GFRNONAA 82* 12/05/2014 0415   GFRAA >90 12/05/2014 0415   Lipase  No results found for: LIPASE     Studies/Results: Dg Chest 1 View  12/04/2014   CLINICAL DATA:  Fever. Nasogastric tube placement. Crohn disease. Anxiety.  EXAM: CHEST  1 VIEW  COMPARISON:  CT of 11/17/2014  FINDINGS: Nasogastric terminates at the body of the stomach. Suspect remote anterior right rib trauma including at the second and possibly the third anterior right ribs. Midline trachea. Normal heart size. No pleural fluid. Low lung volumes. Patchy bibasilar atelectasis. Free intraperitoneal air under both hemidiaphragms.  IMPRESSION: 1. Appropriate position of nasogastric tube. 2. Extensive free intraperitoneal air. Critical test results telephoned toMaggie, r.n. at the time of interpretation at 4:40 p.m.on . 12/04/2014.   Electronically Signed   By: Abigail Miyamoto M.D.   On: 12/04/2014 16:38   Dg Chest Port 1 View  12/04/2014   CLINICAL DATA:  Central line and NG tube placement.  EXAM: PORTABLE CHEST - 1 VIEW  COMPARISON:  Earlier same day  FINDINGS: Grossly unchanged cardiac silhouette and mediastinal contours. Interval placement of a right jugular approach intravenous catheter with tip projected of the  superior cavoatrial junction. Enteric tube tip and side port projects over the expected location of the gastric antrum. No pneumothorax. Apparent resolution of previously noted pneumoperitoneum. Improved aeration of lung bases without focal airspace opacity. No pleural effusion or pneumothorax. Unchanged bones.  IMPRESSION: 1. Appropriately positioned support apparatus as above. No pneumothorax. 2. Apparent resolution of previously noted large amount of pneumoperitoneum. 3. Improved aeration lungs with resolved bibasilar atelectasis.   Electronically Signed   By: Sandi Mariscal M.D.   On: 12/04/2014 21:19   Dg Abd Portable  1v  12/04/2014   CLINICAL DATA:  Nasogastric tube placement.  EXAM: PORTABLE ABDOMEN - 1 VIEW  COMPARISON:  Abdominal CT 11/17/2014  FINDINGS: There is large pneumoperitoneum with Rigler's sign and outlining of the falciform ligament. The colon is distended with high-density material, likely retained contrast from comparison CT. Small bowel dilatation.  Nasogastric tube is in good position. There is a left lower quadrant percutaneous abscess drain.  Critical Value/emergent results were called by telephone at the time of interpretation on 12/04/2014 at 4:50 pm to Dr. [name] , who verbally acknowledged these results.  IMPRESSION: 1. Large pneumoperitoneum consistent with bowel perforation. 2. Small bowel and colonic distention which could be from low colonic obstruction or ileus. 3. Nasogastric tube is in good position. 4. Left lower quadrant percutaneous abscess drain.   Electronically Signed   By: Monte Fantasia M.D.   On: 12/04/2014 16:51    Anti-infectives: Anti-infectives    Start     Dose/Rate Route Frequency Ordered Stop   12/05/14 1000  micafungin (MYCAMINE) 100 mg in sodium chloride 0.9 % 100 mL IVPB  Status:  Discontinued     100 mg 100 mL/hr over 1 Hours Intravenous Daily 12/04/14 2255 12/04/14 2343   12/05/14 0000  fluconazole (DIFLUCAN) IVPB 200 mg     200 mg 100 mL/hr over 60 Minutes Intravenous Every 24 hours 12/04/14 2259     12/05/14 0000  vancomycin (VANCOCIN) IVPB 1000 mg/200 mL premix     1,000 mg 200 mL/hr over 60 Minutes Intravenous Every 8 hours 12/04/14 2356     12/04/14 1515  piperacillin-tazobactam (ZOSYN) IVPB 3.375 g     3.375 g 12.5 mL/hr over 240 Minutes Intravenous Every 8 hours 12/04/14 1510     12/04/14 1415  vancomycin (VANCOCIN) 50 mg/mL oral solution 125 mg  Status:  Discontinued     125 mg Oral 4 times per day 12/04/14 1402 12/04/14 2346   12/04/14 1415  ciprofloxacin (CIPRO) IVPB 400 mg  Status:  Discontinued     400 mg 200 mL/hr over 60 Minutes  Intravenous Every 12 hours 12/04/14 1402 12/04/14 1504       Assessment/Plan Perforated sigmoid colon, perforated cecum, toxic megacolon, small bowel fistula POD #1 s/p Ex Lap, SBR with anastamosis, total abdominal colectomy with end ileostomy, blake drain placement to LLQ -NPO, NG tube, IVF, pain control, antiemetics, antibiotics (Zosyn, Vancomycin, diflucan Day #2 IV) -Await bowel function, likely will have an ileus, and very likely he will develop intra-abdominal abscess that may need to be drained -Midline wound needs to be changed BID, tolerated this well -Mobilize and IS -SCD's and heparin -Ostomy actually putting out stool/flatus, but may develop ileus.  Keep NPO except ice chips for now. -Consider d/c foley tomorrow if urine output picks up, Cr. okay -Increased fentanyl dosage up to 153mcg, add ativan -Appreciate CCM/hospitalist care of the patient  Severe Sepsis 2* to above    LOS: 1 day  DORT, Vincent Black 12/05/2014, 8:05 AM Pager: [phone]

## 2014-12-05 NOTE — Progress Notes (Signed)
eLink Physician-Brief Progress Note Patient Name: Vincent Black DOB: 08/22/1970 MRN: [id number]   Date of Service  12/05/2014  HPI/Events of Note  Oliguria. CVP = 2 - 3.  eICU Interventions  Bolus with 0.9 NaCl 1 liter IV over 1 hour now.      Intervention Category Intermediate Interventions: Oliguria - evaluation and management  Unika Nazareno Eugene 12/05/2014, 7:04 PM

## 2014-12-05 NOTE — Progress Notes (Signed)
UR Completed.  [phone]  

## 2014-12-05 NOTE — Consult Note (Signed)
WOC ostomy consult note CCS following for assessment and plan of care to abd wound. Pt received Ileostomy to RLQ yesterday, and is in ICU on the first post-op day. Stomal assessment/size: Stoma is red and viable when viewed through the pouch which is intact with good seal. Output:  Mod amt semiformed brown stool in pouch. Ostomy pouching: 2pc.  Education provided: Will begin teaching sessions when pt is stable and out of ICU.  Supplies ordered to room for staff nurse use. Julien Girt MSN, RN, [place], [place], [place]

## 2014-12-05 NOTE — Progress Notes (Signed)
PULMONARY / CRITICAL CARE MEDICINE   Name: Vincent Black MRN: [id number] DOB: 05/17/1971    ADMISSION DATE:  12/04/2014 CONSULTATION DATE:  12/05/2014  REFERRING MD :  Donne Hazel  CHIEF COMPLAINT:  Abd pain  INITIAL PRESENTATION:  44 y.o. M with Crohn's and C.diff brought to [facility] 4/25 for worsening abdominal pain.  Found to have perforated sigmoid colon, perforated cecal toxic megacolon, and small bowel fistula.  Vincent Black was taken to the OR for ex lap and small bowel resection with anastomosis, total abdominal colectomy with end ileostomy.  Due to significant stool contamination, Vincent Black was transferred to the ICU post op for close monitoring overnight.  STUDIES:  CXR 4/25 >>> extensive free intraperitoneal air.  AXR 4/25 >>> large pneumoperitoneum c/w bowel perforation.  Small bowel and colonic distention, LLQ percutaneous abscess drain.  SIGNIFICANT EVENTS: 4/25 - admitted for severe abd pain.  Underwent ex lap with SB resection and total abdominal colectomy with end ileostomy.  SUBJECTIVE:  Post op pain that is improved with IV Fentanyl, otherwise no complaints.remains hypotensive  VITAL SIGNS: Temp:  [97.2 F (36.2 C)-99.2 F (37.3 C)] 97.7 F (36.5 C) (04/26 0318) Pulse Rate:  [120-144] 129 (04/25 2230) Resp:  [11-22] 16 (04/26 0300) BP: (91-156)/(66-114) 101/66 mmHg (04/26 0600) SpO2:  [95 %-100 %] 100 % (04/25 2230) Arterial Line BP: (98-128)/(60-73) 128/73 mmHg (04/25 2230) Weight:  [143 lb 8.3 oz (65.1 kg)-147 lb 7.8 oz (66.9 kg)] 147 lb 7.8 oz (66.9 kg) (04/25 2300) HEMODYNAMICS: CVP:  [7 mmHg-9 mmHg] 7 mmHg VENTILATOR SETTINGS:   INTAKE / OUTPUT: Intake/Output      04/25 0701 - 04/26 0700 04/26 0701 - 04/27 0700   I.V. (mL/kg) 4075 (60.9)    IV Piggyback 400    Total Intake(mL/kg) 4475 (66.9)    Urine (mL/kg/hr) 160    Emesis/NG output 300    Drains 295    Total Output 755     Net +3720            PHYSICAL EXAMINATION: General: Vincent Black, in mild distress. Neuro:  A&O x 3, non-focal.  HEENT: [place]/AT. EOMI Cardiovascular: Tachy, regular, no M/R/G.  Lungs: Respirations even and unlabored.  CTA bilaterally, no wehezes. Abdomen: BS absent.  Abd dressing in place and C/D/I.  Left sided JP drain with serosanguinous output, ileostomy in place.  Abd soft and appropriately tender post op. Musculoskeletal: No gross deformities, no edema.  Skin: Intact, warm, no appreiable rashes.  LABS:  CBC  Recent Labs Lab 12/05/14 0415  WBC PENDING  HGB 11.6*  HCT 36.8*  PLT 401*   Coag's  Recent Labs Lab 12/04/14 1625  APTT 35  INR 1.52*   BMET  Recent Labs Lab 12/04/14 1625 12/05/14 0415  NA 130* 133*  K 3.6 3.5  CL 94* 105  CO2 21 17*  BUN 6 9  CREATININE 0.79 1.09  GLUCOSE 213* 191*   Electrolytes  Recent Labs Lab 12/04/14 1625 12/04/14 1650 12/05/14 0415  CALCIUM 7.9*  --  6.7*  MG  --  1.3* 1.1*  PHOS  --  3.6 3.8   Sepsis Markers  Recent Labs Lab 12/04/14 1650 12/04/14 2210 12/05/14 0400  LATICACIDVEN 4.1* 4.0* 4.6*   ABG No results for input(s): PHART, PCO2ART, PO2ART in the last 168 hours.   Liver Enzymes  Recent Labs Lab 12/04/14 1625  AST 26  ALT 8  ALKPHOS 111  BILITOT 0.9  ALBUMIN 1.8*   Cardiac Enzymes No results for input(s): TROPONINI,  PROBNP in the last 168 hours. Glucose No results for input(s): GLUCAP in the last 168 hours.  Imaging Dg Chest 1 View  12/04/2014   CLINICAL DATA:  Fever. Nasogastric tube placement. Crohn disease. Anxiety.  EXAM: CHEST  1 VIEW  COMPARISON:  CT of 11/17/2014  FINDINGS: Nasogastric terminates at the body of the stomach. Suspect remote anterior right rib trauma including at the second and possibly the third anterior right ribs. Midline trachea. Normal heart size. No pleural fluid. Low lung volumes. Patchy bibasilar atelectasis. Free intraperitoneal air under both hemidiaphragms.  IMPRESSION: 1. Appropriate position of nasogastric tube. 2. Extensive free intraperitoneal air.  Critical test results telephoned toMaggie, r.n. at the time of interpretation at 4:40 p.m.on . 12/04/2014.   Electronically Signed   By: Abigail Miyamoto M.D.   On: 12/04/2014 16:38   Dg Chest Port 1 View  12/04/2014   CLINICAL DATA:  Central line and NG tube placement.  EXAM: PORTABLE CHEST - 1 VIEW  COMPARISON:  Earlier same day  FINDINGS: Grossly unchanged cardiac silhouette and mediastinal contours. Interval placement of a right jugular approach intravenous catheter with tip projected of the superior cavoatrial junction. Enteric tube tip and side port projects over the expected location of the gastric antrum. No pneumothorax. Apparent resolution of previously noted pneumoperitoneum. Improved aeration of lung bases without focal airspace opacity. No pleural effusion or pneumothorax. Unchanged bones.  IMPRESSION: 1. Appropriately positioned support apparatus as above. No pneumothorax. 2. Apparent resolution of previously noted large amount of pneumoperitoneum. 3. Improved aeration lungs with resolved bibasilar atelectasis.   Electronically Signed   By: Sandi Mariscal M.D.   On: 12/04/2014 21:19   Dg Abd Portable 1v  12/04/2014   CLINICAL DATA:  Nasogastric tube placement.  EXAM: PORTABLE ABDOMEN - 1 VIEW  COMPARISON:  Abdominal CT 11/17/2014  FINDINGS: There is large pneumoperitoneum with Rigler's sign and outlining of the falciform ligament. The colon is distended with high-density material, likely retained contrast from comparison CT. Small bowel dilatation.  Nasogastric tube is in good position. There is a left lower quadrant percutaneous abscess drain.  Critical Value/emergent results were called by telephone at the time of interpretation on 12/04/2014 at 4:50 pm to Dr. [name] , who verbally acknowledged these results.  IMPRESSION: 1. Large pneumoperitoneum consistent with bowel perforation. 2. Small bowel and colonic distention which could be from low colonic obstruction or ileus. 3. Nasogastric tube is in  good position. 4. Left lower quadrant percutaneous abscess drain.   Electronically Signed   By: Monte Fantasia M.D.   On: 12/04/2014 16:51    ASSESSMENT / PLAN:  GASTROINTESTINAL A:   Perforated sigmoid colon, perforated cecal toxic megacolon, and small bowel fistula - s/p ex lap and SBR with anastomosis, total abdominal colectomy with end ileostomy (4/25 - Dr. Donne Hazel) GI prophylaxis Nutrition H/o Crohn's Recent C.diff (was on PO vanc, flagyl, cipro and was being followed by ID) P:   Post op care per CCS. SUP: Pantoprazole. NPO. Nutrition timing per CCS. See below for abx GI following Given pre op nutritional status would favor early tpn if not able to feed in 72 hrs  CARDIOVASCULAR CVL R IJ 4/25 >>> R radial A line 4/25 >>> A:  Septic shock / sirs due to perforated sigmoid colon, perforated cecal toxic megacolon, and small bowel fistula - s/p ex lap and SBR with anastomosis, total abdominal colectomy with end ileostomy (4/25 - Dr. Donne Hazel) Sinus tachycardia - likely due to hypovolemia + pain  P:  Goal MAP > 60 or sys 90 if mental status Likely needs start pressors  (neo) tachy, repeat lactic acid now IVF boluses PRN clinically, would avoid new line as able just for a cvp Allow pos balance  PULMONARY A: At risk post op atelectasis Lungs clear P:   Pulmonary hygiene. CXR in am for edema risk  RENAL A:   Decreased uop Hyponatremia Hypomagnesemia  Hypokalemia Pseudohypocalcemia - corrects to 8.86 Hypovolemia Acidosis-NONAG P:   Increase fluids 175, change to 1/2 NS  Bolus with saline Send ionized calcium. 2g  IV MgSO4 89mEq KCl IV BMP in AM. Consider addition bicarb for NONAG bmet , mg, phos q8h  HEMATOLOGIC A:   Anemia - chronic Thrombocytosis - chronic VTE Prophylaxis leukocytosis P:  Transfuse for Hgb < 7. SCD's / Heparin. CBC in AM in with diff  INFECTIOUS A:   Severe sepsis due to perforated sigmoid colon, perforated cecal toxic  megacolon, and small bowel fistula - s/p ex lap and SBR with anastomosis, total abdominal colectomy with end ileostomy (4/25 - Dr. Donne Hazel) Hx C.diff - was on PO cipro , flagyl, vanc P:   BCx2 4/25 > UCx 4/25 > Wound Cx 4/25 > Anaerobic Cx 4/25 > Abx: Vanc, 4/25>>. Abx: Zosyn 4/25>> Antifungal:  Diflucan 4/25>> Holding PO abx for now  Change back to mycofungin for NONAlbicans Re add cdfif flagyl IV Feel strong that we need to continued to treat cdiff regardless of subtotal colectomy   ENDOCRINE A:   Hyperglycemia R/o rel AI P:   SSI if glucose consistently > 150. cortisol  NEUROLOGIC A:   Post op pain Hx Anxiety, chronic pain uncontrolled P:   Fentanyl 25 - 100mg  q2hrs PRN. To pca Continue outpatient robaxin. Holding outpatient alprazolam, MS contin, percocet. Will start PRN ativan (high risk WD)  Family updated: None.  Interdisciplinary Family Meeting v Palliative Care Meeting:  Due by: 5/2.   Otho Bellows, MD Internal Medicine Resident, [facility] Pager: [phone] 12/05/2014 7:26 AM   STAFF NOTE: Linwood Dibbles, MD FACP have personally reviewed Vincent Black's available data, including medical history, events of note, physical examination and test results as part of my evaluation. I have discussed with resident/NP and other care providers such as pharmacist, RN and RRT. In addition, I personally evaluated Vincent Black and elicited key findings of: concerns remain, septic shock, hypovolemia, no distress, pcxr in am for risk edema, start neo, may need new line for cvp, LA clearance needed, repeat pending, add myco back for NONALbicans, dc diflucan, supp mag, add bicarb for NONAG  The Vincent Black is critically ill with multiple organ systems failure and requires high complexity decision making for assessment and support, frequent evaluation and titration of therapies, application of advanced monitoring technologies and extensive  interpretation of multiple databases.   Critical Care Time devoted to Vincent Black care services described in this note is30 Minutes. This time reflects time of care of this signee: Merrie Roof, MD FACP. This critical care time does not reflect procedure time, or teaching time or supervisory time of PA/NP/Med student/Med Resident etc but could involve care discussion time. Rest per NP/medical resident whose note is outlined above and that I agree with   Lavon Paganini. Titus Mould, MD, [place] Pgr: [facility] 12/05/2014 10:52 AM

## 2014-12-05 NOTE — Addendum Note (Signed)
Addendum  created 12/05/14 0813 by Nolon Nations, MD   Modules edited: Anesthesia LDA, Lines/Drains/Airways Properties Editor   Lines/Drains/Airways Properties Editor:  Properties of line/drain/airway/wound CVC Double Lumen 12/04/14 Right Internal jugular 15 cm 0 cm have been modified.

## 2014-12-05 NOTE — Progress Notes (Signed)
Critical lactic acid 4.6 consistent with previous results, MD aware.

## 2014-12-05 NOTE — Progress Notes (Signed)
15 mL Fentanyl PCA and 1 mg Dilaudid wasted with Kerry Fort, RN

## 2014-12-05 NOTE — Progress Notes (Signed)
INITIAL NUTRITION ASSESSMENT  Pt meets criteria for SEVERE MALNUTRITION in the context of acute illness or injury as evidenced by a 10% weight loss in 2 months and moderate fat mass loss and moderate to severe muscle mass loss.  DOCUMENTATION CODES Per approved criteria  -Severe malnutrition in the context of acute illness or injury   INTERVENTION: Diet advancement per MD.  RD to continue to monitor.   NUTRITION DIAGNOSIS: Malnutrition related to acute illness as evidenced by weight loss and severe muscle mass loss.   Goal: Pt to meet >/= 90% of their estimated nutrition needs   Monitor:  Diet advancement, weight trends, labs, I/O's  Reason for Assessment: MST  44 y.o. male  Admitting Dx: Abdominal pain  ASSESSMENT: Pt with history of Crohn's disease, several recent admissions in March 2016 after being treated severe C. difficile colitis complicated by pericolonic abscess requiring drain placement. CT abdomen was done on 11/17/2014 and showed abscess cavity collapsed. Pt presented with hematemesis, generalized weakness, right upper quadrant area pain.   Procedure (4/25):  1. Ex lap 2. Small bowel resection with anastomosis 3. Total abdominal colectomy with end ileostomy  Pt is currently NPO with NGT in place for decompression as pt likely with ileus. Family at bedside. Pt reports PTA he was eating fine with no other difficulties up until the day prior to admission. Family reports pt has had weight loss due C. Diff from previous admission where they reports usual body weight of 190 lbs. Per Epic weight records, pt with a 10% weight loss in 2 months. Pt reports he tried drinking Ensure at home, however it caused abdominal discomfort and reports once diet advances he would like to try alternative supplements. RD to continue to monitor.   Nutrition Focused Physical Exam:  Subcutaneous Fat:  Orbital Region: N/A Upper Arm Region: Mild depletions Thoracic and Lumbar Region: Mild  to moderate depletion  Muscle:  Temple Region: Mild to moderate depletion Clavicle Bone Region: Moderate depletion Clavicle and Acromion Bone Region: Moderate depletion Scapular Bone Region: N/A Dorsal Hand: N/A Patellar Region: Moderate depletion Anterior Thigh Region: Severe depletion Posterior Calf Region: Moderate to severe depletion  Edema: none  Labs: Low sodium, CO2, calcium, and GFR.  Height: Ht Readings from Last 1 Encounters:  12/04/14 5\' 9"  (1.753 m)    Weight: Wt Readings from Last 1 Encounters:  12/04/14 147 lb 7.8 oz (66.9 kg)    Ideal Body Weight: 160 lbs  % Ideal Body Weight: 92%  Wt Readings from Last 10 Encounters:  12/04/14 147 lb 7.8 oz (66.9 kg)  11/21/14 145 lb (65.772 kg)  09/28/14 164 lb 0.4 oz (74.4 kg)  02/23/12 193 lb (87.544 kg)    Usual Body Weight: 190 lbs per family report  % Usual Body Weight: 77%  BMI:  Body mass index is 21.77 kg/(m^2).  Estimated Nutritional Needs: Kcal: 1900-2100 Protein: 90-110 grams Fluid: 1.9 - 2.1 L/day  Skin: Incision on abdomen  Diet Order: Diet NPO time specified  EDUCATION NEEDS: -No education needs identified at this time   Intake/Output Summary (Last 24 hours) at 12/05/14 1000 Last data filed at 12/05/14 0809  Gross per 24 hour  Intake   4475 ml  Output    910 ml  Net   3565 ml    Last BM: PTA  Labs:   Recent Labs Lab 12/04/14 1625 12/04/14 1650 12/05/14 0415  NA 130*  --  133*  K 3.6  --  3.5  CL 94*  --  105  CO2 21  --  17*  BUN 6  --  9  CREATININE 0.79  --  1.09  CALCIUM 7.9*  --  6.7*  MG  --  1.3* 1.1*  PHOS  --  3.6 3.8  GLUCOSE 213*  --  191*    CBG (last 3)  No results for input(s): GLUCAP in the last 72 hours.  Scheduled Meds: . [START ON 12/06/2014] antiseptic oral rinse  7 mL Mouth Rinse BID  . fluconazole (DIFLUCAN) IV  200 mg Intravenous Q24H  . heparin subcutaneous  5,000 Units Subcutaneous 3 times per day  . pantoprazole (PROTONIX) IV  40 mg  Intravenous Q24H  . piperacillin-tazobactam (ZOSYN)  IV  3.375 g Intravenous Q8H  . vancomycin  1,000 mg Intravenous Q8H    Continuous Infusions: . sodium chloride 175 mL/hr at 12/05/14 0803    Past Medical History  Diagnosis Date  . Crohn disease   . C. difficile colitis 09/30/2014  . Ankylosing spondylitis 09/30/2014  . Anxiety     Past Surgical History  Procedure Laterality Date  . Flexible sigmoidoscopy N/A 09/29/2014    Procedure: FLEXIBLE SIGMOIDOSCOPY;  Surgeon: Beryle Beams, MD;  Location: [facility];  Service: Endoscopy;  Laterality: N/A;    Kallie Locks, MS, RD, LDN Pager # [phone] After hours/ weekend pager # [phone]

## 2014-12-05 NOTE — Consult Note (Signed)
[facility] for Infectious Disease     Reason for Consult:C diff colitis, Abdominal perforation    Referring Physician: PCCM  Principal Problem:   Abdominal pain Active Problems:   Crohn's disease   History of Clostridium difficile colitis   Protein-calorie malnutrition, severe   Unintentional weight loss   . [START ON 12/06/2014] antiseptic oral rinse  7 mL Mouth Rinse BID  . fentaNYL   Intravenous 6 times per day  . heparin subcutaneous  5,000 Units Subcutaneous 3 times per day  . metronidazole  500 mg Intravenous Q8H  . micafungin (MYCAMINE) IV  100 mg Intravenous Daily  . pantoprazole (PROTONIX) IV  40 mg Intravenous Q24H  . piperacillin-tazobactam (ZOSYN)  IV  3.375 g Intravenous Q8H  . vancomycin  1,000 mg Intravenous Q8H    Recommendations: - Continue IV Vanc and zosyn and Micafungin - Can D/C Flagyl - Will need to follow Abdominal CT for abscess and need drainage - Will follow  Assessment: Septic shock due to perforated cecum and sigmoid colon Hx of C diff Leukemoid reaction Malnourished  Hx Esophageal Candidiasis   Antibiotics: IV Vancomycin :  4/25 >>> IV Zosyn :  4/25 >>> IV Flagyl : 4/26 >>> PO Cipro: 4/25 x1  Antifungals: IV Micafungin >>> 4/26>>> IV Fluconazole 4/26 x1  HPI: Vincent Black is a 44 y.o. male with a past medical history including severe Crohn's disease complicated by pericolonic abscess in March which treatment cause a recurrence of C diff colitis, and Ankylosing spondylitis.  He has been followed recently by Dr Megan Salon in the Infectious disease clinic.  And had been receiving treatment with Cipro, Flagyl and oral Vancomycin (as well as diflucan for esophageal candidiasis) and he had noted to have a recurrence of a small bowel fistula he subsequently followed up with Dr Benson Norway on 4/25 for hematemesis, and RUQ pain and was arranged for admission to the hospital at that time.  On arrival he appeared toxic and was found to have massive  pneumoperitoneum and surgery was called for exlap which noted perforated sigmoid colon, perforated cecum, toxic megacolon, and small bowel fistula and he was treated with a total abdominal colectomy and ileostomy.  He currently reports he is feeling slightly better but remains nauseous with diffuse abdominal pain and pressure.  He reports he has not restarted Humira (last dose was months ago) although the was the plan from the last ID note 4/12.  Review of Systems: Review of Systems  Constitutional: Positive for malaise/fatigue. Negative for fever and chills.  Eyes: Negative for blurred vision.  Respiratory: Negative for cough.   Cardiovascular: Negative for chest pain.  Gastrointestinal: Positive for nausea and abdominal pain. Negative for vomiting.  Genitourinary: Negative for dysuria.  Skin: Negative for rash.  Neurological: Positive for weakness. Negative for dizziness and headaches.     Past Medical History  Diagnosis Date  . Crohn disease   . C. difficile colitis 09/30/2014  . Ankylosing spondylitis 09/30/2014  . Anxiety     History  Substance Use Topics  . Smoking status: Former Smoker -- 0.50 packs/day for 10 years    Types: Cigarettes  . Smokeless tobacco: Never Used     Comment: 'quit smoking in ~ 2014"  . Alcohol Use: Yes     Comment: 10/25/2014 "might have a drink a couple times/yr"    History reviewed. No pertinent family history. No Known Allergies  OBJECTIVE: Blood pressure 104/78, pulse 137, temperature 98.8 F (37.1 C), temperature source  Oral, resp. rate 24, height 5\' 9"  (1.753 m), weight 147 lb 7.8 oz (66.9 kg), SpO2 97 %. General: resting in bed knees flexed, PCA pump in hand, NGT in place Skin: warm, dry Lungs: CTAB Cor: Tachycardic, no appreciated murmur Abdomen: bandages c/d/i, diffuse tenderness to palpation   Microbiology: Recent Results (from the past 240 hour(s))  Culture, blood (x 2)     Status: None (Preliminary result)   Collection Time:  12/04/14  4:50 PM  Result Value Ref Range Status   Specimen Description BLOOD LEFT HAND  Final   Special Requests BOTTLES DRAWN AEROBIC ONLY 2CC  Final   Culture PENDING  Incomplete   Report Status PENDING  Incomplete  MRSA PCR Screening     Status: None   Collection Time: 12/04/14  5:32 PM  Result Value Ref Range Status   MRSA by PCR NEGATIVE NEGATIVE Final    Comment:        The GeneXpert MRSA Assay (FDA approved for NASAL specimens only), is one component of a comprehensive MRSA colonization surveillance program. It is not intended to diagnose MRSA infection nor to guide or monitor treatment for MRSA infections.   Wound culture     Status: None (Preliminary result)   Collection Time: 12/04/14  7:10 PM  Result Value Ref Range Status   Specimen Description ABDOMEN  Final   Special Requests PATIENT ON FOLLOWING ZINACEF  Final   Gram Stain   Final    FEW WBC PRESENT,BOTH PMN AND MONONUCLEAR NO SQUAMOUS EPITHELIAL CELLS SEEN NO ORGANISMS SEEN Performed at [facility]    Culture PENDING  Incomplete   Report Status PENDING  Incomplete  Anaerobic culture     Status: None (Preliminary result)   Collection Time: 12/04/14  7:10 PM  Result Value Ref Range Status   Specimen Description WOUND ABDOMEN  Final   Special Requests PT ON ZINACEF  Final   Gram Stain   Final    FEW WBC PRESENT,BOTH PMN AND MONONUCLEAR NO SQUAMOUS EPITHELIAL CELLS SEEN NO ORGANISMS SEEN Performed at [facility]    Culture   Final    NO ANAEROBES ISOLATED; CULTURE IN PROGRESS FOR 5 DAYS Performed at [facility]    Report Status PENDING  Incomplete    Lucious Groves, DO IMTS PGY-2 Pager [phone] 12/05/2014, 2:17 PM

## 2014-12-05 NOTE — Progress Notes (Signed)
[place] TEAM 1 - Stepdown/ICU TEAM Progress Note  Vincent Black PIR:[id number] DOB: September 11, 1970 DOA: 12/04/2014 PCP: No PCP Per Patient  Admit HPI / Brief Narrative: 44 y.o. WM PMHx anxiety, ankylosing spondylitis, Crohn's and C.diff brought to [facility] 4/25 for worsening abdominal pain. Found to have perforated sigmoid colon, perforated cecal toxic megacolon, and small bowel fistula. He was taken to the OR for ex lap and small bowel resection with anastomosis, total abdominal colectomy with end ileostomy. Due to significant stool contamination, he was transferred to the ICU post op for close monitoring overnight.  HPI/Subjective: 4/26 A/O 4, states abdominal pain has decreased in severity, only complaint is persistent sore throat  Assessment/Plan: Perforated sigmoid colon, perforated cecal toxic megacolon, and small bowel fistula  - s/p ex lap and SBR with anastomosis, total abdominal colectomy with end ileostomy (4/25 - Dr. Donne Hazel)  Crohn's Dz  C.diff  colitis  -Continue metronidazole and vancomycin  -Given pre op nutritional status would favor TPN  starting on 4/28  Septic shock / secondary perforated sigmoid colon, perforated cecal toxic megacolon, and small bowel fistula  - s/p ex lap and SBR with anastomosis, total abdominal colectomy with end ileostomy (4/25 - Dr. Donne Hazel) -ue to perforated sigmoid colon, perforated cecal toxic megacolon, and small bowel fistula - s/p ex lap and SBR with anastomosis, total abdominal colectomy with end ileostomy (4/25 - Dr. Donne Hazel)  Sinus tachycardia -Multifactorial to include hypovolemia + pain -Normal saline 125 ml/hr  -Goal MAP > 60  Decreased uop -Secondary dehydration -Strict in and out   Hyponatremia -Switch 0.45% saline to normal saline at 125 ml/hr  Hypomagnesemia  - Hypokalemia  Pseudohypocalcemia  - corrects to 8.86  Acidosis-NONAG -Improving with hydration, continue to monitor:   Anemia -  chronic -Transfuse for Hgb < 7.  Thrombocytosis - chronic  leukocytosis CBC in AM in with diff  Hyperglycemia -Sensitive SSI -cortisol pending  Acute on chronic pain uncontrolled  -Fentanyl PCA -Continue outpatient robaxin. -1000 mg  TID.  Anxiety -PRN  Ativan    Code Status: FULL Family Communication: no family present at time of exam Disposition Plan: Surgery    Consultants: Dr.Robert W Comer (ID) Dr Donnie Mesa (surgery). Dr.Patrick Benson Norway (GI) Dr.Daniel Lily Kocher [facility] M)  Procedure/Significant Events: CXR 4/25 >>> extensive free intraperitoneal air.  AXR 4/25 >>> large pneumoperitoneum c/w bowel perforation. Small bowel and colonic distention, LLQ percutaneous abscess drain.    Culture BCx2 4/25 > UCx 4/25 > Wound Cx 4/25 > Anaerobic Cx 4/25 >   Antibiotics: Zosyn 4/25>> Antifungal: Diflucan 4/25>> Metronidazole 4/26>> Vanc, 4/25>>.  DVT prophylaxis:  SCD's / Heparin.   Devices    LINES / TUBES:  CVL R IJ 4/25 >>> R radial A line 4/25 >>>     Continuous Infusions: . sodium chloride 125 mL/hr at 12/05/14 2016  . phenylephrine (NEO-SYNEPHRINE) Adult infusion Stopped (12/05/14 2017)  .  sodium bicarbonate infusion 1/4 NS 1000 mL 50 mL/hr at 12/05/14 1900    Objective: VITAL SIGNS: Temp: 98.4 F (36.9 C) (04/26 2005) Temp Source: Oral (04/26 2005) BP: 127/80 mmHg (04/26 1900) Pulse Rate: 132 (04/26 1900) SPO2; FIO2:   Intake/Output Summary (Last 24 hours) at 12/05/14 2024 Last data filed at 12/05/14 1900  Gross per 24 hour  Intake 5064.47 ml  Output    753 ml  Net 4311.47 ml     Exam: General:  A/O 4, pain only partially controlled, left-sided JP drain in place,No acute respiratory distress Lungs: Clear  to auscultation bilaterally without wheezes or crackles Cardiovascular:  Tachycardic,Regular rhythm without murmur gallop or rub normal S1 and S2 Abdomen:  appropriatelytender, nondistended, soft,  hypoactive bowel  sounds,no rebound, no ascites, no appreciable mass, JP drain in place on the left draining serosanguineous fluid, midline vertical incision covered and clean did not undress, colostomy bag in place RLL.  Extremities: No significant cyanosis, clubbing, or edema bilateral lower extremities  Data Reviewed: Basic Metabolic Panel:  Recent Labs Lab 12/04/14 1625 12/04/14 1650 12/05/14 0415 12/05/14 1657  NA 130*  --  133* 132*  K 3.6  --  3.5 3.6  CL 94*  --  105 104  CO2 21  --  17* 19  GLUCOSE 213*  --  191* 145*  BUN 6  --  9 16  CREATININE 0.79  --  1.09 1.82*  CALCIUM 7.9*  --  6.7* 6.3*  MG  --  1.3* 1.1* 1.6  PHOS  --  3.6 3.8 3.2   Liver Function Tests:  Recent Labs Lab 12/04/14 1625  AST 26  ALT 8  ALKPHOS 111  BILITOT 0.9  PROT 5.9*  ALBUMIN 1.8*   No results for input(s): LIPASE, AMYLASE in the last 168 hours. No results for input(s): AMMONIA in the last 168 hours. CBC:  Recent Labs Lab 12/05/14 0415  WBC 72.5*  HGB 11.6*  HCT 36.8*  MCV 88.0  PLT 401*   Cardiac Enzymes: No results for input(s): CKTOTAL, CKMB, CKMBINDEX, TROPONINI in the last 168 hours. BNP (last 3 results) No results for input(s): BNP in the last 8760 hours.  ProBNP (last 3 results) No results for input(s): PROBNP in the last 8760 hours.  CBG: No results for input(s): GLUCAP in the last 168 hours.  Recent Results (from the past 240 hour(s))  Culture, blood (x 2)     Status: None (Preliminary result)   Collection Time: 12/04/14  4:50 PM  Result Value Ref Range Status   Specimen Description BLOOD LEFT HAND  Final   Special Requests BOTTLES DRAWN AEROBIC ONLY 2CC  Final   Culture PENDING  Incomplete   Report Status PENDING  Incomplete  MRSA PCR Screening     Status: None   Collection Time: 12/04/14  5:32 PM  Result Value Ref Range Status   MRSA by PCR NEGATIVE NEGATIVE Final    Comment:        The GeneXpert MRSA Assay (FDA approved for NASAL specimens only), is one  component of a comprehensive MRSA colonization surveillance program. It is not intended to diagnose MRSA infection nor to guide or monitor treatment for MRSA infections.   Wound culture     Status: None (Preliminary result)   Collection Time: 12/04/14  7:10 PM  Result Value Ref Range Status   Specimen Description ABDOMEN  Final   Special Requests PATIENT ON FOLLOWING ZINACEF  Final   Gram Stain   Final    FEW WBC PRESENT,BOTH PMN AND MONONUCLEAR NO SQUAMOUS EPITHELIAL CELLS SEEN NO ORGANISMS SEEN Performed at [facility]    Culture PENDING  Incomplete   Report Status PENDING  Incomplete  Anaerobic culture     Status: None (Preliminary result)   Collection Time: 12/04/14  7:10 PM  Result Value Ref Range Status   Specimen Description WOUND ABDOMEN  Final   Special Requests PT ON ZINACEF  Final   Gram Stain   Final    FEW WBC PRESENT,BOTH PMN AND MONONUCLEAR NO SQUAMOUS EPITHELIAL CELLS SEEN NO  ORGANISMS SEEN Performed at [facility]    Culture   Final    NO ANAEROBES ISOLATED; CULTURE IN PROGRESS FOR 5 DAYS Performed at [facility]    Report Status PENDING  Incomplete     Studies:  Recent x-ray studies have been reviewed in detail by the Attending Physician  Scheduled Meds:  Scheduled Meds: . [START ON 12/06/2014] antiseptic oral rinse  7 mL Mouth Rinse BID  . fentaNYL   Intravenous 6 times per day  . heparin subcutaneous  5,000 Units Subcutaneous 3 times per day  . insulin aspart  0-9 Units Subcutaneous 6 times per day  . metronidazole  500 mg Intravenous Q8H  . micafungin (MYCAMINE) IV  100 mg Intravenous Daily  . pantoprazole (PROTONIX) IV  40 mg Intravenous Q24H  . piperacillin-tazobactam (ZOSYN)  IV  3.375 g Intravenous Q8H  . sodium chloride  1,000 mL Intravenous Once  . vancomycin  1,000 mg Intravenous Q8H    Time spent on care of this patient: 40 mins   WOODS, Geraldo Docker , MD  [facility] Office  [phone] Pager  [phone]  On-Call/Text Page:      [email or web address]      password [email or web address]  If 7PM-7AM, please contact night-coverage [email or web address] Password [facility] 12/05/2014, 8:24 PM   LOS: 1 day   Care during the described time interval was provided by me .  I have reviewed this patient's available data, including medical history, events of note, physical examination, radiology studies and test results as part of my evaluation  Dia Crawford, MD [phone] Pager

## 2014-12-05 NOTE — Significant Event (Signed)
CRITICAL VALUE ALERT  Critical value received:  WBC 72.5    Date of notification: 12/05/14   Time of notification:  0727 Critical value read back:Yes.    Nurse who received alert: Wynn Banker RN  MD notified (1st page):  DR Benson Norway    Time of first page:  0730  MD notified (2nd page):  Time of second page:  Responding MD:    Time MD responded: At [facility]

## 2014-12-05 NOTE — Consult Note (Signed)
Reason for Consult: Crohn's Disease Referring Physician: CCM  Vincent Black HPI: This is a 44 year old male who is well-known to me for his severe Crohn's disease, recent C. Diff colitis, small bowel abscess secondary to a fistula, and ankylosing spondylitis admitted for acute worsening of his abdominal pain.  The patient was slowly progressing during my evaluation of him in the office last Wednesday, however, on Friday, per his report, he started to have an increase in his abdominal pain.  Yesterday his wife called and she reported that his abdominal pain was severe and the drainage was producing a "sucking sound".  Arrangements were made for admission to the hospital and upon arrival it was clear that he was extremely ill.  A CXR was performed and it revealed free air in the abdomen.  Dr. Donne Hazel operated on him emergently and noted that he had toxic megacolon.  Two areas of perforation were noted in the cecum and the sigmoid colon.  He has a history of severe pan colitis from his colonoscopy in 06/2014.  As a result of the findings he had a total colectomy with an end ileostomy.    Past Medical History  Diagnosis Date  . Crohn disease   . C. difficile colitis 09/30/2014  . Ankylosing spondylitis 09/30/2014  . Anxiety     Past Surgical History  Procedure Laterality Date  . Flexible sigmoidoscopy N/A 09/29/2014    Procedure: FLEXIBLE SIGMOIDOSCOPY;  Surgeon: Beryle Beams, MD;  Location: [facility];  Service: Endoscopy;  Laterality: N/A;    History reviewed. No pertinent family history.  Social History:  reports that he has quit smoking. His smoking use included Cigarettes. He has a 5 pack-year smoking history. He has never used smokeless tobacco. He reports that he drinks alcohol. He reports that he does not use illicit drugs.  Allergies: No Known Allergies  Medications:  Scheduled: . [START ON 12/06/2014] antiseptic oral rinse  7 mL Mouth Rinse BID  . fluconazole (DIFLUCAN) IV  200  mg Intravenous Q24H  . heparin subcutaneous  5,000 Units Subcutaneous 3 times per day  . HYDROmorphone      . HYDROmorphone      . magnesium sulfate 1 - 4 g bolus IVPB  2 g Intravenous Once  . pantoprazole (PROTONIX) IV  40 mg Intravenous Q24H  . piperacillin-tazobactam (ZOSYN)  IV  3.375 g Intravenous Q8H  . potassium chloride  10 mEq Intravenous Q1 Hr x 2  . vancomycin  1,000 mg Intravenous Q8H   Continuous: . sodium chloride 125 mL/hr at 12/04/14 1530    Results for orders placed or performed during the hospital encounter of 12/04/14 (from the past 24 hour(s))  Comprehensive metabolic panel     Status: Abnormal   Collection Time: 12/04/14  4:25 PM  Result Value Ref Range   Sodium 130 (L) 135 - 145 mmol/L   Potassium 3.6 3.5 - 5.1 mmol/L   Chloride 94 (L) 96 - 112 mmol/L   CO2 21 19 - 32 mmol/L   Glucose, Bld 213 (H) 70 - 99 mg/dL   BUN 6 6 - 23 mg/dL   Creatinine, Ser 0.79 0.50 - 1.35 mg/dL   Calcium 7.9 (L) 8.4 - 10.5 mg/dL   Total Protein 5.9 (L) 6.0 - 8.3 g/dL   Albumin 1.8 (L) 3.5 - 5.2 g/dL   AST 26 0 - 37 U/L   ALT 8 0 - 53 U/L   Alkaline Phosphatase 111 39 - 117 U/L  Total Bilirubin 0.9 0.3 - 1.2 mg/dL   GFR calc non Af Amer >90 >90 mL/min   GFR calc Af Amer >90 >90 mL/min   Anion gap 15 5 - 15  Protime-INR     Status: Abnormal   Collection Time: 12/04/14  4:25 PM  Result Value Ref Range   Prothrombin Time 18.4 (H) 11.6 - 15.2 seconds   INR 1.52 (H) 0.00 - 1.49  APTT     Status: None   Collection Time: 12/04/14  4:25 PM  Result Value Ref Range   aPTT 35 24 - 37 seconds  Magnesium     Status: Abnormal   Collection Time: 12/04/14  4:50 PM  Result Value Ref Range   Magnesium 1.3 (L) 1.5 - 2.5 mg/dL  Phosphorus     Status: None   Collection Time: 12/04/14  4:50 PM  Result Value Ref Range   Phosphorus 3.6 2.3 - 4.6 mg/dL  Culture, blood (x 2)     Status: None (Preliminary result)   Collection Time: 12/04/14  4:50 PM  Result Value Ref Range   Specimen  Description BLOOD LEFT HAND    Special Requests BOTTLES DRAWN AEROBIC ONLY 2CC    Culture PENDING    Report Status PENDING   Lactic acid, plasma     Status: Abnormal   Collection Time: 12/04/14  4:50 PM  Result Value Ref Range   Lactic Acid, Venous 4.1 (HH) 0.5 - 2.0 mmol/L  MRSA PCR Screening     Status: None   Collection Time: 12/04/14  5:32 PM  Result Value Ref Range   MRSA by PCR NEGATIVE NEGATIVE  Lactic acid, plasma     Status: Abnormal   Collection Time: 12/04/14 10:10 PM  Result Value Ref Range   Lactic Acid, Venous 4.0 (HH) 0.5 - 2.0 mmol/L  Lactic acid, plasma     Status: Abnormal   Collection Time: 12/05/14  4:00 AM  Result Value Ref Range   Lactic Acid, Venous 4.6 (HH) 0.5 - 2.0 mmol/L  Basic metabolic panel     Status: Abnormal   Collection Time: 12/05/14  4:15 AM  Result Value Ref Range   Sodium 133 (L) 135 - 145 mmol/L   Potassium 3.5 3.5 - 5.1 mmol/L   Chloride 105 96 - 112 mmol/L   CO2 17 (L) 19 - 32 mmol/L   Glucose, Bld 191 (H) 70 - 99 mg/dL   BUN 9 6 - 23 mg/dL   Creatinine, Ser 1.09 0.50 - 1.35 mg/dL   Calcium 6.7 (L) 8.4 - 10.5 mg/dL   GFR calc non Af Amer 82 (L) >90 mL/min   GFR calc Af Amer >90 >90 mL/min   Anion gap 11 5 - 15  CBC     Status: Abnormal   Collection Time: 12/05/14  4:15 AM  Result Value Ref Range   WBC 72.5 (HH) 4.0 - 10.5 K/uL   RBC 4.18 (L) 4.22 - 5.81 MIL/uL   Hemoglobin 11.6 (L) 13.0 - 17.0 g/dL   HCT 36.8 (L) 39.0 - 52.0 %   MCV 88.0 78.0 - 100.0 fL   MCH 27.8 26.0 - 34.0 pg   MCHC 31.5 30.0 - 36.0 g/dL   RDW 16.6 (H) 11.5 - 15.5 %   Platelets 401 (H) 150 - 400 K/uL  Magnesium     Status: Abnormal   Collection Time: 12/05/14  4:15 AM  Result Value Ref Range   Magnesium 1.1 (L) 1.5 - 2.5 mg/dL  Phosphorus  Status: None   Collection Time: 12/05/14  4:15 AM  Result Value Ref Range   Phosphorus 3.8 2.3 - 4.6 mg/dL     Dg Chest 1 View  12/04/2014   CLINICAL DATA:  Fever. Nasogastric tube placement. Crohn disease.  Anxiety.  EXAM: CHEST  1 VIEW  COMPARISON:  CT of 11/17/2014  FINDINGS: Nasogastric terminates at the body of the stomach. Suspect remote anterior right rib trauma including at the second and possibly the third anterior right ribs. Midline trachea. Normal heart size. No pleural fluid. Low lung volumes. Patchy bibasilar atelectasis. Free intraperitoneal air under both hemidiaphragms.  IMPRESSION: 1. Appropriate position of nasogastric tube. 2. Extensive free intraperitoneal air. Critical test results telephoned toMaggie, r.n. at the time of interpretation at 4:40 p.m.on . 12/04/2014.   Electronically Signed   By: Abigail Miyamoto M.D.   On: 12/04/2014 16:38   Dg Chest Port 1 View  12/04/2014   CLINICAL DATA:  Central line and NG tube placement.  EXAM: PORTABLE CHEST - 1 VIEW  COMPARISON:  Earlier same day  FINDINGS: Grossly unchanged cardiac silhouette and mediastinal contours. Interval placement of a right jugular approach intravenous catheter with tip projected of the superior cavoatrial junction. Enteric tube tip and side port projects over the expected location of the gastric antrum. No pneumothorax. Apparent resolution of previously noted pneumoperitoneum. Improved aeration of lung bases without focal airspace opacity. No pleural effusion or pneumothorax. Unchanged bones.  IMPRESSION: 1. Appropriately positioned support apparatus as above. No pneumothorax. 2. Apparent resolution of previously noted large amount of pneumoperitoneum. 3. Improved aeration lungs with resolved bibasilar atelectasis.   Electronically Signed   By: Sandi Mariscal M.D.   On: 12/04/2014 21:19   Dg Abd Portable 1v  12/04/2014   CLINICAL DATA:  Nasogastric tube placement.  EXAM: PORTABLE ABDOMEN - 1 VIEW  COMPARISON:  Abdominal CT 11/17/2014  FINDINGS: There is large pneumoperitoneum with Rigler's sign and outlining of the falciform ligament. The colon is distended with high-density material, likely retained contrast from comparison CT.  Small bowel dilatation.  Nasogastric tube is in good position. There is a left lower quadrant percutaneous abscess drain.  Critical Value/emergent results were called by telephone at the time of interpretation on 12/04/2014 at 4:50 pm to Dr. [name] , who verbally acknowledged these results.  IMPRESSION: 1. Large pneumoperitoneum consistent with bowel perforation. 2. Small bowel and colonic distention which could be from low colonic obstruction or ileus. 3. Nasogastric tube is in good position. 4. Left lower quadrant percutaneous abscess drain.   Electronically Signed   By: Monte Fantasia M.D.   On: 12/04/2014 16:51    ROS:  As stated above in the HPI otherwise negative.  Blood pressure 101/66, pulse 129, temperature 97.7 F (36.5 C), temperature source Oral, resp. rate 16, height 5\' 9"  (1.753 m), weight 66.9 kg (147 lb 7.8 oz), SpO2 100 %.    PE: Gen: Fatigued and weak appearing. HEENT:  Zinc/AT, EOMI Neck: Supple, no LAD Lungs: CTA Bilaterally CV: RRR without M/G/R ABM: tender at the incision site Ext: No C/C/E  Assessment/Plan: 1) Toxic megacolon. 2) Leukomoid reaction.   The patient is stable.  He complains of a dry mouth.  I am appreciative of Dr. Cristal Generous intervention.  This may put him into surgical remission.  At this time no further immunosuppression is required.    Plan: 1) Continue with routine post-op care. 2) Agree with antibiotics. 3) No immunosuppression at this time.    Vincent Black  D 12/05/2014, 7:37 AM

## 2014-12-05 NOTE — Progress Notes (Signed)
Inpatient Diabetes Program Recommendations  AACE/ADA: New Consensus Statement on Inpatient Glycemic Control (2013)  Target Ranges:  Prepandial:   less than 140 mg/dL      Peak postprandial:   less than 180 mg/dL (1-2 hours)      Critically ill patients:  140 - 180 mg/dL     Results for Vincent Black, Vincent Black (MRN [id number]) as of 12/05/2014 10:07  Ref. Range 12/04/2014 16:25 12/05/2014 04:15  Glucose Latest Ref Range: 70-99 mg/dL 213 (H) 191 (H)    Admit with: Perforated sigmoid colon, perforated cecum toxic megacolon, small bowel fistula   MD- Note patient has had 2 elevated lab blood glucose levels.  Currently NPO.  Please consider checking CBGs Q4 hours and cover with Novolog Sensitive SSI Q4 hours if CBGs elevated.     Will follow Wyn Quaker RN, MSN, CDE Diabetes Coordinator Inpatient Diabetes Program Team Pager: [phone] (8a-5p)

## 2014-12-06 ENCOUNTER — Inpatient Hospital Stay (HOSPITAL_COMMUNITY): Payer: 59

## 2014-12-06 DIAGNOSIS — F411 Generalized anxiety disorder: Secondary | ICD-10-CM

## 2014-12-06 DIAGNOSIS — K631 Perforation of intestine (nontraumatic): Secondary | ICD-10-CM

## 2014-12-06 DIAGNOSIS — A09 Infectious gastroenteritis and colitis, unspecified: Secondary | ICD-10-CM

## 2014-12-06 DIAGNOSIS — G894 Chronic pain syndrome: Secondary | ICD-10-CM

## 2014-12-06 LAB — CBC WITH DIFFERENTIAL/PLATELET
BASOS ABS: 0 10*3/uL (ref 0.0–0.1)
BASOS ABS: 0 10*3/uL (ref 0.0–0.1)
Basophils Relative: 0 % (ref 0–1)
Basophils Relative: 0 % (ref 0–1)
Eosinophils Absolute: 0 10*3/uL (ref 0.0–0.7)
Eosinophils Absolute: 0 10*3/uL (ref 0.0–0.7)
Eosinophils Relative: 0 % (ref 0–5)
Eosinophils Relative: 0 % (ref 0–5)
HEMATOCRIT: 23 % — AB (ref 39.0–52.0)
HEMATOCRIT: 25.8 % — AB (ref 39.0–52.0)
HEMOGLOBIN: 7.4 g/dL — AB (ref 13.0–17.0)
Hemoglobin: 8.1 g/dL — ABNORMAL LOW (ref 13.0–17.0)
LYMPHS PCT: 3 % — AB (ref 12–46)
LYMPHS PCT: 2 % — AB (ref 12–46)
Lymphs Abs: 1.3 10*3/uL (ref 0.7–4.0)
Lymphs Abs: 1.2 10*3/uL (ref 0.7–4.0)
MCH: 27.7 pg (ref 26.0–34.0)
MCH: 27.5 pg (ref 26.0–34.0)
MCHC: 32.2 g/dL (ref 30.0–36.0)
MCHC: 31.4 g/dL (ref 30.0–36.0)
MCV: 86.1 fL (ref 78.0–100.0)
MCV: 87.5 fL (ref 78.0–100.0)
MONOS PCT: 4 % (ref 3–12)
MONOS PCT: 3 % (ref 3–12)
Monocytes Absolute: 1.7 10*3/uL — ABNORMAL HIGH (ref 0.1–1.0)
Monocytes Absolute: 1.8 10*3/uL — ABNORMAL HIGH (ref 0.1–1.0)
NEUTROS PCT: 93 % — AB (ref 43–77)
NEUTROS PCT: 95 % — AB (ref 43–77)
Neutro Abs: 39.6 10*3/uL — ABNORMAL HIGH (ref 1.7–7.7)
Neutro Abs: 57.9 10*3/uL — ABNORMAL HIGH (ref 1.7–7.7)
Platelets: 222 10*3/uL (ref 150–400)
Platelets: 280 10*3/uL (ref 150–400)
RBC: 2.67 MIL/uL — ABNORMAL LOW (ref 4.22–5.81)
RBC: 2.95 MIL/uL — ABNORMAL LOW (ref 4.22–5.81)
RDW: 17 % — ABNORMAL HIGH (ref 11.5–15.5)
RDW: 16.8 % — AB (ref 11.5–15.5)
WBC: 42.6 10*3/uL — ABNORMAL HIGH (ref 4.0–10.5)
WBC: 60.9 10*3/uL — AB (ref 4.0–10.5)

## 2014-12-06 LAB — CORTISOL: Cortisol, Plasma: 95.4 ug/dL

## 2014-12-06 LAB — COMPREHENSIVE METABOLIC PANEL
ALT: 9 U/L (ref 0–53)
AST: 18 U/L (ref 0–37)
Alkaline Phosphatase: 154 U/L — ABNORMAL HIGH (ref 39–117)
Anion gap: 7 (ref 5–15)
BUN: 21 mg/dL (ref 6–23)
CO2: 19 mmol/L (ref 19–32)
Calcium: 6.2 mg/dL — CL (ref 8.4–10.5)
Chloride: 107 mmol/L (ref 96–112)
Creatinine, Ser: 2.16 mg/dL — ABNORMAL HIGH (ref 0.50–1.35)
GFR calc Af Amer: 42 mL/min — ABNORMAL LOW (ref >90)
GFR calc non Af Amer: 36 mL/min — ABNORMAL LOW (ref >90)
Glucose, Bld: 120 mg/dL — ABNORMAL HIGH (ref 70–99)
Potassium: 3.4 mmol/L — ABNORMAL LOW (ref 3.5–5.1)
Sodium: 133 mmol/L — ABNORMAL LOW (ref 135–145)
Total Bilirubin: 0.5 mg/dL (ref 0.3–1.2)
Total Protein: 3.6 g/dL — ABNORMAL LOW (ref 6.0–8.3)

## 2014-12-06 LAB — BASIC METABOLIC PANEL
Anion gap: 9 (ref 5–15)
BUN: 27 mg/dL — ABNORMAL HIGH (ref 6–23)
CO2: 16 mmol/L — ABNORMAL LOW (ref 19–32)
CREATININE: 2.49 mg/dL — AB (ref 0.50–1.35)
Calcium: 6.8 mg/dL — ABNORMAL LOW (ref 8.4–10.5)
Chloride: 107 mmol/L (ref 96–112)
GFR calc Af Amer: 35 mL/min — ABNORMAL LOW (ref >90)
GFR calc non Af Amer: 30 mL/min — ABNORMAL LOW (ref >90)
Glucose, Bld: 123 mg/dL — ABNORMAL HIGH (ref 70–99)
Potassium: 3.8 mmol/L (ref 3.5–5.1)
Sodium: 132 mmol/L — ABNORMAL LOW (ref 135–145)

## 2014-12-06 LAB — PHOSPHORUS: PHOSPHORUS: 4.2 mg/dL (ref 2.3–4.6)

## 2014-12-06 LAB — GLUCOSE, CAPILLARY
GLUCOSE-CAPILLARY: 102 mg/dL — AB (ref 70–99)
GLUCOSE-CAPILLARY: 104 mg/dL — AB (ref 70–99)
GLUCOSE-CAPILLARY: 85 mg/dL (ref 70–99)
GLUCOSE-CAPILLARY: 85 mg/dL (ref 70–99)
Glucose-Capillary: 103 mg/dL — ABNORMAL HIGH (ref 70–99)
Glucose-Capillary: 100 mg/dL — ABNORMAL HIGH (ref 70–99)

## 2014-12-06 LAB — MAGNESIUM
MAGNESIUM: 1.5 mg/dL (ref 1.5–2.5)
MAGNESIUM: 2.6 mg/dL — AB (ref 1.5–2.5)

## 2014-12-06 LAB — CALCIUM, IONIZED: CALCIUM, IONIZED, SERUM: 3.9 mg/dL — AB (ref 4.5–5.6)

## 2014-12-06 LAB — LACTIC ACID, PLASMA: Lactic Acid, Venous: 1.2 mmol/L (ref 0.5–2.0)

## 2014-12-06 MED ORDER — POTASSIUM CHLORIDE 10 MEQ/50ML IV SOLN
10.0000 meq | INTRAVENOUS | Status: AC
  Administered 2014-12-06 (×2): 10 meq via INTRAVENOUS
  Filled 2014-12-06 (×2): qty 50

## 2014-12-06 MED ORDER — MAGNESIUM SULFATE 4 GM/100ML IV SOLN
4.0000 g | Freq: Once | INTRAVENOUS | Status: AC
  Administered 2014-12-06: 4 g via INTRAVENOUS
  Filled 2014-12-06: qty 100

## 2014-12-06 MED ORDER — FAMOTIDINE IN NACL 20-0.9 MG/50ML-% IV SOLN
20.0000 mg | Freq: Two times a day (BID) | INTRAVENOUS | Status: DC
  Administered 2014-12-06 – 2014-12-07 (×3): 20 mg via INTRAVENOUS
  Filled 2014-12-06 (×4): qty 50

## 2014-12-06 MED ORDER — FLUCONAZOLE IN SODIUM CHLORIDE 400-0.9 MG/200ML-% IV SOLN
400.0000 mg | INTRAVENOUS | Status: DC
  Administered 2014-12-06: 400 mg via INTRAVENOUS
  Filled 2014-12-06 (×3): qty 200

## 2014-12-06 MED ORDER — SODIUM CHLORIDE 0.9 % IV BOLUS (SEPSIS)
1000.0000 mL | Freq: Once | INTRAVENOUS | Status: AC
  Administered 2014-12-06: 1000 mL via INTRAVENOUS

## 2014-12-06 NOTE — Progress Notes (Signed)
[facility]  Date of Admission:  12/04/2014  Antibiotics: IV Vancomycin : 4/25 >>> IV Zosyn : 4/25 >>> IV Flagyl : 4/26 >>> PO Cipro: 4/25 x1  Subjective: Patient remains nauseous but feeling better, pain moderately controlled with PCA pump  Objective: Temp:  [98 F (36.7 C)-99 F (37.2 C)] 98 F (36.7 C) (04/27 0800) Pulse Rate:  [121-139] 124 (04/27 0900) Resp:  [15-28] 17 (04/27 0900) BP: (103-132)/(58-86) 130/82 mmHg (04/27 0900) SpO2:  [93 %-100 %] 100 % (04/27 0900)  General: ill appearing Skin: warm, dry Lungs: CTAB Cor: Tachycardic, regular, no appreciated murmur Abdomen: ileostomy in place with liquid stool, JP drain with small amount of serosanguinous output, tenderness around surgical sites.   Lab Results Lab Results  Component Value Date   WBC 42.6* 12/06/2014   HGB 7.4* 12/06/2014   HCT 23.0* 12/06/2014   MCV 86.1 12/06/2014   PLT 222 12/06/2014    Lab Results  Component Value Date   CREATININE 2.16* 12/06/2014   BUN 21 12/06/2014   NA 133* 12/06/2014   K 3.4* 12/06/2014   CL 107 12/06/2014   CO2 19 12/06/2014    Lab Results  Component Value Date   ALT 9 12/06/2014   AST 18 12/06/2014   ALKPHOS 154* 12/06/2014   BILITOT 0.5 12/06/2014      Microbiology: Recent Results (from the past 240 hour(s))  Culture, blood (x 2)     Status: None (Preliminary result)   Collection Time: 12/04/14  4:50 PM  Result Value Ref Range Status   Specimen Description BLOOD LEFT HAND  Final   Special Requests BOTTLES DRAWN AEROBIC ONLY 2CC  Final   Culture   Final           BLOOD CULTURE RECEIVED NO GROWTH TO DATE CULTURE WILL BE HELD FOR 5 DAYS BEFORE ISSUING A FINAL NEGATIVE REPORT Performed at [facility]    Report Status PENDING  Incomplete  Culture, blood (x 2)     Status: None (Preliminary result)   Collection Time: 12/04/14  4:50 PM  Result Value Ref Range Status   Specimen Description BLOOD RIGHT HAND  Final     Special Requests BOTTLES DRAWN AEROBIC ONLY 5CC  Final   Culture   Final           BLOOD CULTURE RECEIVED NO GROWTH TO DATE CULTURE WILL BE HELD FOR 5 DAYS BEFORE ISSUING A FINAL NEGATIVE REPORT Performed at [facility]    Report Status PENDING  Incomplete  MRSA PCR Screening     Status: None   Collection Time: 12/04/14  5:32 PM  Result Value Ref Range Status   MRSA by PCR NEGATIVE NEGATIVE Final    Comment:        The GeneXpert MRSA Assay (FDA approved for NASAL specimens only), is one component of a comprehensive MRSA colonization surveillance program. It is not intended to diagnose MRSA infection nor to guide or monitor treatment for MRSA infections.   Wound culture     Status: None (Preliminary result)   Collection Time: 12/04/14  7:10 PM  Result Value Ref Range Status   Specimen Description ABDOMEN  Final   Special Requests PATIENT ON FOLLOWING ZINACEF  Final   Gram Stain   Final    FEW WBC PRESENT,BOTH PMN AND MONONUCLEAR NO SQUAMOUS EPITHELIAL CELLS SEEN NO ORGANISMS SEEN Performed at [facility]    Culture   Final    FEW  CANDIDA ALBICANS Performed at [facility]    Report Status PENDING  Incomplete  Anaerobic culture     Status: None (Preliminary result)   Collection Time: 12/04/14  7:10 PM  Result Value Ref Range Status   Specimen Description WOUND ABDOMEN  Final   Special Requests PT ON ZINACEF  Final   Gram Stain   Final    FEW WBC PRESENT,BOTH PMN AND MONONUCLEAR NO SQUAMOUS EPITHELIAL CELLS SEEN NO ORGANISMS SEEN Performed at [facility]    Culture   Final    NO ANAEROBES ISOLATED; CULTURE IN PROGRESS FOR 5 DAYS Performed at [facility]    Report Status PENDING  Incomplete    Studies/Results: Dg Chest 1 View  12/04/2014   CLINICAL DATA:  Fever. Nasogastric tube placement. Crohn disease. Anxiety.  EXAM: CHEST  1 VIEW  COMPARISON:  CT of 11/17/2014  FINDINGS: Nasogastric terminates at the body of the  stomach. Suspect remote anterior right rib trauma including at the second and possibly the third anterior right ribs. Midline trachea. Normal heart size. No pleural fluid. Low lung volumes. Patchy bibasilar atelectasis. Free intraperitoneal air under both hemidiaphragms.  IMPRESSION: 1. Appropriate position of nasogastric tube. 2. Extensive free intraperitoneal air. Critical test results telephoned toMaggie, r.n. at the time of interpretation at 4:40 p.m.on . 12/04/2014.   Electronically Signed   By: Abigail Miyamoto M.D.   On: 12/04/2014 16:38   Dg Chest Port 1 View  12/06/2014   CLINICAL DATA:  Edema  EXAM: PORTABLE CHEST - 1 VIEW  COMPARISON:  12/04/2014  FINDINGS: Low lung volumes with vascular crowding. Possible mild interstitial edema with bibasilar atelectasis. No pneumothorax.  The heart is normal in size.  Right IJ venous catheter terminates at the cavoatrial junction.  Enteric tube traverses in the distal gastric antrum.  Lucency beneath the right hemidiaphragm could suggest free air related to recent surgery.  IMPRESSION: Low lung volumes with vascular crowding and possible mild interstitial edema.  Bibasilar atelectasis.  Support apparatus as above.   Electronically Signed   By: Julian Hy M.D.   On: 12/06/2014 09:45   Dg Chest Port 1 View  12/04/2014   CLINICAL DATA:  Central line and NG tube placement.  EXAM: PORTABLE CHEST - 1 VIEW  COMPARISON:  Earlier same day  FINDINGS: Grossly unchanged cardiac silhouette and mediastinal contours. Interval placement of a right jugular approach intravenous catheter with tip projected of the superior cavoatrial junction. Enteric tube tip and side port projects over the expected location of the gastric antrum. No pneumothorax. Apparent resolution of previously noted pneumoperitoneum. Improved aeration of lung bases without focal airspace opacity. No pleural effusion or pneumothorax. Unchanged bones.  IMPRESSION: 1. Appropriately positioned support apparatus  as above. No pneumothorax. 2. Apparent resolution of previously noted large amount of pneumoperitoneum. 3. Improved aeration lungs with resolved bibasilar atelectasis.   Electronically Signed   By: Sandi Mariscal M.D.   On: 12/04/2014 21:19   Dg Abd Portable 1v  12/04/2014   CLINICAL DATA:  Nasogastric tube placement.  EXAM: PORTABLE ABDOMEN - 1 VIEW  COMPARISON:  Abdominal CT 11/17/2014  FINDINGS: There is large pneumoperitoneum with Rigler's sign and outlining of the falciform ligament. The colon is distended with high-density material, likely retained contrast from comparison CT. Small bowel dilatation.  Nasogastric tube is in good position. There is a left lower quadrant percutaneous abscess drain.  Critical Value/emergent results were called by telephone at the time of interpretation on 12/04/2014  at 4:50 pm to Dr. [name] , who verbally acknowledged these results.  IMPRESSION: 1. Large pneumoperitoneum consistent with bowel perforation. 2. Small bowel and colonic distention which could be from low colonic obstruction or ileus. 3. Nasogastric tube is in good position. 4. Left lower quadrant percutaneous abscess drain.   Electronically Signed   By: Monte Fantasia M.D.   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

## 2014-12-06 NOTE — Progress Notes (Signed)
[facility] Progress Note  2 Days Post-Op  Subjective: Pt feels much better with dilaudid pca.  No N/V, tolerating ice chips and sips of water.  Not been OOB yet - still tachy and was on levo.  Stool and gas in ostomy bag.    Objective: Vital signs in last 24 hours: Temp:  [97.8 F (36.6 C)-99 F (37.2 C)] 98.4 F (36.9 C) (04/27 0423) Pulse Rate:  [121-141] 127 (04/27 0600) Resp:  [15-28] 18 (04/27 0600) BP: (95-127)/(58-86) 116/66 mmHg (04/27 0600) SpO2:  [93 %-100 %] 98 % (04/27 0600)    Intake/Output from previous day: 04/26 0701 - 04/27 0700 In: 4070.8 [I.V.:2740.8; IV Piggyback:1150] Out: 358 [Urine:58; Emesis/NG output:250; Drains:50] Intake/Output this shift:    PE: Gen: Alert, NAD, pleasant Card: RRR, no M/G/R heard Pulm: CTA, no W/R/R, good effort Abd: Soft, mildly distended, appropriately tender over midline incision and prior drain site, +BS, no HSM, midline incision clean and beefy red, minimal sanguinous drainage, blake drain in place in LLQ (at new site) with serosanguinous with small amount of serous fluid.  Lab Results:   Recent Labs  12/05/14 0415 12/06/14 0311  WBC 72.5* 42.6*  HGB 11.6* 7.4*  HCT 36.8* 23.0*  PLT 401* 222   BMET  Recent Labs  12/05/14 1657 12/06/14 0311  NA 132* 133*  K 3.6 3.4*  CL 104 107  CO2 19 19  GLUCOSE 145* 120*  BUN 16 21  CREATININE 1.82* 2.16*  CALCIUM 6.3* 6.2*   PT/INR  Recent Labs  12/04/14 1625  LABPROT 18.4*  INR 1.52*   CMP     Component Value Date/Time   NA 133* 12/06/2014 0311   K 3.4* 12/06/2014 0311   CL 107 12/06/2014 0311   CO2 19 12/06/2014 0311   GLUCOSE 120* 12/06/2014 0311   BUN 21 12/06/2014 0311   CREATININE 2.16* 12/06/2014 0311   CALCIUM 6.2* 12/06/2014 0311   PROT 3.6* 12/06/2014 0311   ALBUMIN <1.0* 12/06/2014 0311   AST 18 12/06/2014 0311   ALT 9 12/06/2014 0311   ALKPHOS 154* 12/06/2014 0311   BILITOT 0.5 12/06/2014 0311   GFRNONAA 36* 12/06/2014  0311   GFRAA 42* 12/06/2014 0311   Lipase  No results found for: LIPASE     Studies/Results: Dg Chest 1 View  12/04/2014   CLINICAL DATA:  Fever. Nasogastric tube placement. Crohn disease. Anxiety.  EXAM: CHEST  1 VIEW  COMPARISON:  CT of 11/17/2014  FINDINGS: Nasogastric terminates at the body of the stomach. Suspect remote anterior right rib trauma including at the second and possibly the third anterior right ribs. Midline trachea. Normal heart size. No pleural fluid. Low lung volumes. Patchy bibasilar atelectasis. Free intraperitoneal air under both hemidiaphragms.  IMPRESSION: 1. Appropriate position of nasogastric tube. 2. Extensive free intraperitoneal air. Critical test results telephoned toMaggie, r.n. at the time of interpretation at 4:40 p.m.on . 12/04/2014.   Electronically Signed   By: Abigail Miyamoto M.D.   On: 12/04/2014 16:38   Dg Chest Port 1 View  12/04/2014   CLINICAL DATA:  Central line and NG tube placement.  EXAM: PORTABLE CHEST - 1 VIEW  COMPARISON:  Earlier same day  FINDINGS: Grossly unchanged cardiac silhouette and mediastinal contours. Interval placement of a right jugular approach intravenous catheter with tip projected of the superior cavoatrial junction. Enteric tube tip and side port projects over the expected location of the gastric antrum. No pneumothorax. Apparent resolution of previously noted pneumoperitoneum. Improved aeration  of lung bases without focal airspace opacity. No pleural effusion or pneumothorax. Unchanged bones.  IMPRESSION: 1. Appropriately positioned support apparatus as above. No pneumothorax. 2. Apparent resolution of previously noted large amount of pneumoperitoneum. 3. Improved aeration lungs with resolved bibasilar atelectasis.   Electronically Signed   By: Sandi Mariscal M.D.   On: 12/04/2014 21:19   Dg Abd Portable 1v  12/04/2014   CLINICAL DATA:  Nasogastric tube placement.  EXAM: PORTABLE ABDOMEN - 1 VIEW  COMPARISON:  Abdominal CT 11/17/2014   FINDINGS: There is large pneumoperitoneum with Rigler's sign and outlining of the falciform ligament. The colon is distended with high-density material, likely retained contrast from comparison CT. Small bowel dilatation.  Nasogastric tube is in good position. There is a left lower quadrant percutaneous abscess drain.  Critical Value/emergent results were called by telephone at the time of interpretation on 12/04/2014 at 4:50 pm to Dr. [name] , who verbally acknowledged these results.  IMPRESSION: 1. Large pneumoperitoneum consistent with bowel perforation. 2. Small bowel and colonic distention which could be from low colonic obstruction or ileus. 3. Nasogastric tube is in good position. 4. Left lower quadrant percutaneous abscess drain.   Electronically Signed   By: Monte Fantasia M.D.   On: 12/04/2014 16:51    Anti-infectives: Anti-infectives    Start     Dose/Rate Route Frequency Ordered Stop   12/06/14 0000  micafungin (MYCAMINE) 100 mg in sodium chloride 0.9 % 100 mL IVPB  Status:  Discontinued     100 mg 100 mL/hr over 1 Hours Intravenous Daily 12/05/14 1108 12/05/14 1332   12/05/14 1500  micafungin (MYCAMINE) 100 mg in sodium chloride 0.9 % 100 mL IVPB     100 mg 100 mL/hr over 1 Hours Intravenous Daily 12/05/14 1332     12/05/14 1200  metroNIDAZOLE (FLAGYL) IVPB 500 mg     500 mg 100 mL/hr over 60 Minutes Intravenous Every 8 hours 12/05/14 1108     12/05/14 1000  micafungin (MYCAMINE) 100 mg in sodium chloride 0.9 % 100 mL IVPB  Status:  Discontinued     100 mg 100 mL/hr over 1 Hours Intravenous Daily 12/04/14 2255 12/04/14 2343   12/05/14 0000  fluconazole (DIFLUCAN) IVPB 200 mg  Status:  Discontinued     200 mg 100 mL/hr over 60 Minutes Intravenous Every 24 hours 12/04/14 2259 12/05/14 1107   12/05/14 0000  vancomycin (VANCOCIN) IVPB 1000 mg/200 mL premix     1,000 mg 200 mL/hr over 60 Minutes Intravenous Every 8 hours 12/04/14 2356     12/04/14 1515  piperacillin-tazobactam  (ZOSYN) IVPB 3.375 g     3.375 g 12.5 mL/hr over 240 Minutes Intravenous Every 8 hours 12/04/14 1510     12/04/14 1415  vancomycin (VANCOCIN) 50 mg/mL oral solution 125 mg  Status:  Discontinued     125 mg Oral 4 times per day 12/04/14 1402 12/04/14 2346   12/04/14 1415  ciprofloxacin (CIPRO) IVPB 400 mg  Status:  Discontinued     400 mg 200 mL/hr over 60 Minutes Intravenous Every 12 hours 12/04/14 1402 12/04/14 1504       Assessment/Plan Perforated sigmoid colon, perforated cecum, toxic megacolon, small bowel fistula POD #2 s/p Ex Lap, SBR with anastamosis, total abdominal colectomy with end ileostomy, blake drain placement to LLQ -Start clamping trials of NG tube, allow 60-141mL clear liquids while NG is clamped.  Check residuals every 6 hours and document in epic.   -IVF, pain control, antiemetics,  antibiotics (Zosyn, Vancomycin, diflucan Day #3 IV) -Await bowel function, likely will have an ileus, and very likely he will develop intra-abdominal abscess that may need to be drained -Midline wound needs to be changed BID -Mobilize and IS -SCD's and heparin -Ostomy actually putting out stool/flatus, but may develop ileus. Attempt clamping trials. -Minimal UOP, Cr. Up to 2.16, keep foley for strict I&O's -Dilaudid PCA is controlling his pain much better than fentanyl -Appreciate CCM/hospitalist care of the patient Deconditioning -PT ordered AKI -Minimal UOP, Cr. Up to 2.16, keep foley for strict I&O's Severe Sepsis 2* to above PCM -hopefully can advance diet soon and avoid NPO/TPN    LOS: 2 days    DORT, Dalicia Kisner 12/06/2014, 7:53 AM Pager: [phone]

## 2014-12-06 NOTE — Progress Notes (Signed)
Subjective: Feeling better.  Pain is under control.  Objective: Vital signs in last 24 hours: Temp:  [97.8 F (36.6 C)-99 F (37.2 C)] 98.4 F (36.9 C) (04/27 0423) Pulse Rate:  [121-141] 127 (04/27 0600) Resp:  [15-28] 18 (04/27 0600) BP: (95-127)/(58-86) 116/66 mmHg (04/27 0600) SpO2:  [93 %-100 %] 98 % (04/27 0600)    Intake/Output from previous day: 04/26 0701 - 04/27 0700 In: 4070.8 [I.V.:2740.8; IV Piggyback:1150] Out: 358 [Urine:58; Emesis/NG output:250; Drains:50] Intake/Output this shift:    General appearance: alert, fatigued and no distress GI: tender at the incision site  Lab Results:  Recent Labs  12/05/14 0415 12/06/14 0311  WBC 72.5* 42.6*  HGB 11.6* 7.4*  HCT 36.8* 23.0*  PLT 401* 222   BMET  Recent Labs  12/05/14 0415 12/05/14 1657 12/06/14 0311  NA 133* 132* 133*  K 3.5 3.6 3.4*  CL 105 104 107  CO2 17* 19 19  GLUCOSE 191* 145* 120*  BUN 9 16 21   CREATININE 1.09 1.82* 2.16*  CALCIUM 6.7* 6.3* 6.2*   LFT  Recent Labs  12/06/14 0311  PROT 3.6*  ALBUMIN <1.0*  AST 18  ALT 9  ALKPHOS 154*  BILITOT 0.5   PT/INR  Recent Labs  12/04/14 1625  LABPROT 18.4*  INR 1.52*   Hepatitis Panel No results for input(s): HEPBSAG, HCVAB, HEPAIGM, HEPBIGM in the last 72 hours. C-Diff No results for input(s): CDIFFTOX in the last 72 hours. Fecal Lactopherrin No results for input(s): FECLLACTOFRN in the last 72 hours.  Studies/Results: Dg Chest 1 View  12/04/2014   CLINICAL DATA:  Fever. Nasogastric tube placement. Crohn disease. Anxiety.  EXAM: CHEST  1 VIEW  COMPARISON:  CT of 11/17/2014  FINDINGS: Nasogastric terminates at the body of the stomach. Suspect remote anterior right rib trauma including at the second and possibly the third anterior right ribs. Midline trachea. Normal heart size. No pleural fluid. Low lung volumes. Patchy bibasilar atelectasis. Free intraperitoneal air under both hemidiaphragms.  IMPRESSION: 1. Appropriate  position of nasogastric tube. 2. Extensive free intraperitoneal air. Critical test results telephoned toMaggie, r.n. at the time of interpretation at 4:40 p.m.on . 12/04/2014.   Electronically Signed   By: Abigail Miyamoto M.D.   On: 12/04/2014 16:38   Dg Chest Port 1 View  12/04/2014   CLINICAL DATA:  Central line and NG tube placement.  EXAM: PORTABLE CHEST - 1 VIEW  COMPARISON:  Earlier same day  FINDINGS: Grossly unchanged cardiac silhouette and mediastinal contours. Interval placement of a right jugular approach intravenous catheter with tip projected of the superior cavoatrial junction. Enteric tube tip and side port projects over the expected location of the gastric antrum. No pneumothorax. Apparent resolution of previously noted pneumoperitoneum. Improved aeration of lung bases without focal airspace opacity. No pleural effusion or pneumothorax. Unchanged bones.  IMPRESSION: 1. Appropriately positioned support apparatus as above. No pneumothorax. 2. Apparent resolution of previously noted large amount of pneumoperitoneum. 3. Improved aeration lungs with resolved bibasilar atelectasis.   Electronically Signed   By: Sandi Mariscal M.D.   On: 12/04/2014 21:19   Dg Abd Portable 1v  12/04/2014   CLINICAL DATA:  Nasogastric tube placement.  EXAM: PORTABLE ABDOMEN - 1 VIEW  COMPARISON:  Abdominal CT 11/17/2014  FINDINGS: There is large pneumoperitoneum with Rigler's sign and outlining of the falciform ligament. The colon is distended with high-density material, likely retained contrast from comparison CT. Small bowel dilatation.  Nasogastric tube is in good position. There is  a left lower quadrant percutaneous abscess drain.  Critical Value/emergent results were called by telephone at the time of interpretation on 12/04/2014 at 4:50 pm to Dr. [name] , who verbally acknowledged these results.  IMPRESSION: 1. Large pneumoperitoneum consistent with bowel perforation. 2. Small bowel and colonic distention which  could be from low colonic obstruction or ileus. 3. Nasogastric tube is in good position. 4. Left lower quadrant percutaneous abscess drain.   Electronically Signed   By: Monte Fantasia M.D.   On: 12/04/2014 16:51    Medications:  Scheduled: . antiseptic oral rinse  7 mL Mouth Rinse BID  . heparin subcutaneous  5,000 Units Subcutaneous 3 times per day  .  HYDROmorphone (DILAUDID) injection  1 mg Intravenous Once  . HYDROmorphone PCA 0.3 mg/mL   Intravenous 6 times per day  . insulin aspart  0-9 Units Subcutaneous 6 times per day  . metronidazole  500 mg Intravenous Q8H  . micafungin (MYCAMINE) IV  100 mg Intravenous Daily  . pantoprazole (PROTONIX) IV  40 mg Intravenous Q24H  . piperacillin-tazobactam (ZOSYN)  IV  3.375 g Intravenous Q8H  . vancomycin  1,000 mg Intravenous Q8H   Continuous: . sodium chloride 125 mL/hr at 12/05/14 2016  . phenylephrine (NEO-SYNEPHRINE) Adult infusion Stopped (12/05/14 2017)  .  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 2) Renal insufficiency.   He appears to be progressing, however, his creatinine has increased.  His urine output was low yesterday and he did receive a 1 liter bolus.  Hopefully this issue will resolve with continued IV hydration.  Plan: 1) Continue with routine post operative care. 2) Continue with supportive care.   LOS: 2 days   Heinz Eckert D 12/06/2014, 7:33 AM

## 2014-12-06 NOTE — Progress Notes (Signed)
Critical calcium noted. Corrects with albumin. Consistent with previous value. MD notified.

## 2014-12-06 NOTE — Progress Notes (Signed)
ANTIBIOTIC CONSULT NOTE - INITIAL  Pharmacy Consult for fluconazole Indication: sepsis  No Known Allergies  Patient Measurements: Height: 5\' 9"  (175.3 cm) Weight: 147 lb 7.8 oz (66.9 kg) IBW/kg (Calculated) : 70.7  Vital Signs: Temp: 97.5 F (36.4 C) (04/27 1200) Temp Source: Oral (04/27 1200) BP: 117/71 mmHg (04/27 1200) Pulse Rate: 117 (04/27 1200) Intake/Output from previous day: 04/26 0701 - 04/27 0700 In: 4070.8 [I.V.:2740.8; IV Piggyback:1150] Out: 358 [Urine:58; Emesis/NG output:250; Drains:50] Intake/Output from this shift: Total I/O In: 2584.2 [P.O.:180; I.V.:2104.2; IV Piggyback:300] Out: 628 [Urine:350; Emesis/NG output:150; Drains:128]  Labs:  Recent Labs  12/05/14 0415 12/05/14 1657 12/06/14 0311  WBC 72.5*  --  42.6*  HGB 11.6*  --  7.4*  PLT 401*  --  222  CREATININE 1.09 1.82* 2.16*   Estimated Creatinine Clearance: 42.2 mL/min (by C-G formula based on Cr of 2.16). No results for input(s): VANCOTROUGH, VANCOPEAK, VANCORANDOM, GENTTROUGH, GENTPEAK, GENTRANDOM, TOBRATROUGH, TOBRAPEAK, TOBRARND, AMIKACINPEAK, AMIKACINTROU, AMIKACIN in the last 72 hours.   Microbiology: Recent Results (from the past 720 hour(s))  Culture, blood (x 2)     Status: None (Preliminary result)   Collection Time: 12/04/14  4:50 PM  Result Value Ref Range Status   Specimen Description BLOOD LEFT HAND  Final   Special Requests BOTTLES DRAWN AEROBIC ONLY 2CC  Final   Culture   Final           BLOOD CULTURE RECEIVED NO GROWTH TO DATE CULTURE WILL BE HELD FOR 5 DAYS BEFORE ISSUING A FINAL NEGATIVE REPORT Performed at [facility]    Report Status PENDING  Incomplete  Culture, blood (x 2)     Status: None (Preliminary result)   Collection Time: 12/04/14  4:50 PM  Result Value Ref Range Status   Specimen Description BLOOD RIGHT HAND  Final   Special Requests BOTTLES DRAWN AEROBIC ONLY 5CC  Final   Culture   Final           BLOOD CULTURE RECEIVED NO GROWTH TO DATE  CULTURE WILL BE HELD FOR 5 DAYS BEFORE ISSUING A FINAL NEGATIVE REPORT Performed at [facility]    Report Status PENDING  Incomplete  MRSA PCR Screening     Status: None   Collection Time: 12/04/14  5:32 PM  Result Value Ref Range Status   MRSA by PCR NEGATIVE NEGATIVE Final    Comment:        The GeneXpert MRSA Assay (FDA approved for NASAL specimens only), is one component of a comprehensive MRSA colonization surveillance program. It is not intended to diagnose MRSA infection nor to guide or monitor treatment for MRSA infections.   Wound culture     Status: None (Preliminary result)   Collection Time: 12/04/14  7:10 PM  Result Value Ref Range Status   Specimen Description ABDOMEN  Final   Special Requests PATIENT ON FOLLOWING ZINACEF  Final   Gram Stain   Final    FEW WBC PRESENT,BOTH PMN AND MONONUCLEAR NO SQUAMOUS EPITHELIAL CELLS SEEN NO ORGANISMS SEEN Performed at [facility]    Culture   Final    FEW CANDIDA ALBICANS Performed at [facility]    Report Status PENDING  Incomplete  Anaerobic culture     Status: None (Preliminary result)   Collection Time: 12/04/14  7:10 PM  Result Value Ref Range Status   Specimen Description WOUND ABDOMEN  Final   Special Requests PT ON ZINACEF  Final   Gram Stain  Final    FEW WBC PRESENT,BOTH PMN AND MONONUCLEAR NO SQUAMOUS EPITHELIAL CELLS SEEN NO ORGANISMS SEEN Performed at [facility]    Culture   Final    NO ANAEROBES ISOLATED; CULTURE IN PROGRESS FOR 5 DAYS Performed at [facility]    Report Status PENDING  Incomplete    Medical History: Past Medical History  Diagnosis Date  . Crohn disease   . C. difficile colitis 09/30/2014  . Ankylosing spondylitis 09/30/2014  . Anxiety     Medications:  Scheduled:  . antiseptic oral rinse  7 mL Mouth Rinse BID  . famotidine (PEPCID) IV  20 mg Intravenous Q12H  . fluconazole (DIFLUCAN) IV  400 mg Intravenous Q24H  . heparin  subcutaneous  5,000 Units Subcutaneous 3 times per day  .  HYDROmorphone (DILAUDID) injection  1 mg Intravenous Once  . HYDROmorphone PCA 0.3 mg/mL   Intravenous 6 times per day  . insulin aspart  0-9 Units Subcutaneous 6 times per day  . metronidazole  500 mg Intravenous Q8H  . piperacillin-tazobactam (ZOSYN)  IV  3.375 g Intravenous Q8H   Infusions:  . sodium chloride 75 mL/hr (12/06/14 1205)  . phenylephrine (NEO-SYNEPHRINE) Adult infusion Stopped (12/05/14 2017)   PRN: butamben-tetracaine-benzocaine, LORazepam, methocarbamol (ROBAXIN)  IV, ondansetron **OR** ondansetron (ZOFRAN) IV   Assessment: 44yo male w/ h/o Crohn's, most recent hospital discharge 3/30 after severe sepsis 2/2 intraabdominal abscess, now c/o sudden onset of RUQ pain, generalized weakness, and hematemesis, admitted to MICU after ex lap and total colectomy, to begin IV ABX for sepsis. Currently afebrile, WBC trending down (72.5>>42.6), SCr trending up (0.79>>1.09>>1.82>>2.16). Candida albicans growing in wound culture prompted switch from micafungin to fluconazole.   Plan:  Start fluconazole 400mg  IV given every 24 hours.  Follow temperature, WBC, SCr, and clinical improvement.   Roxy Horseman, PharmD Candidate 12/06/2014,1:01 PM   In addition pt remains on flagyl for cdiff, despite total colectomy, and zosyn for intra-abdominal process. Blood cultures remain ngtd. Watch trend in SCr.   Hughes Better, PharmD, BCPS Clinical Pharmacist Pager: [phone] 12/06/2014 3:19 PM

## 2014-12-06 NOTE — Consult Note (Signed)
WOC ostomy follow up Stoma type/location:  Ileostoy intact to RLQ.  Current pouch is leaking behind barrier. Stomal assessment/size: 1 1/2 inches, even with skin level. Red and moist.   Peristomal assessment: Intact Output: Moderate amount of unformed brown stool.  Ostomy pouching: 2pc.with barrier ring to maintain seal. Education provided: Demonstrated pouch change to patient and wife at bedside.Patient asked approiate questions. Wife was able to open and close Velcro to empty pouch. Supplies are ordered at the bedside for staff nurses. Will continue education sessions when stable and out of ICU.   Julien Girt MSN, RN, [place], [place], [place]

## 2014-12-06 NOTE — Progress Notes (Signed)
PULMONARY / CRITICAL CARE MEDICINE   Name: Vincent Black MRN: [id number] DOB: 29-Dec-1970    ADMISSION DATE:  12/04/2014 CONSULTATION DATE:  12/06/2014  REFERRING MD :  Donne Hazel  CHIEF COMPLAINT:  Abd pain  INITIAL PRESENTATION:  44 y.o. M with Crohn's and C.diff brought to [facility] 4/25 for worsening abdominal pain.  Found to have perforated sigmoid colon, perforated cecal toxic megacolon, and small bowel fistula.  He was taken to the OR for ex lap and small bowel resection with anastomosis, total abdominal colectomy with end ileostomy.  Due to significant stool contamination, he was transferred to the ICU post op for close monitoring.  STUDIES:  CXR 4/25 >>> extensive free intraperitoneal air.  AXR 4/25 >>> large pneumoperitoneum c/w bowel perforation.  Small bowel and colonic distention, LLQ percutaneous abscess drain.  SIGNIFICANT EVENTS: 4/25: admitted for severe abd pain.  Underwent ex lap with SB resection and total abdominal colectomy with end ileostomy. 4/26: Poor uop despite increased IVF and 4L in boluses, shock pressors 4/27- improved LA, this am increased output urine  SUBJECTIVE:   Pain improved after changing to dilaudid PCA. Pt states he is feeling better from yesterday. BPs improved, pt off Neo. Still minimal uop.  VITAL SIGNS: Temp:  [97.8 F (36.6 C)-99 F (37.2 C)] 98.4 F (36.9 C) (04/27 0423) Pulse Rate:  [121-141] 127 (04/27 0600) Resp:  [15-28] 18 (04/27 0600) BP: (102-127)/(58-86) 116/66 mmHg (04/27 0600) SpO2:  [93 %-100 %] 98 % (04/27 0600) HEMODYNAMICS: CVP:  [2 mmHg-9 mmHg] 9 mmHg VENTILATOR SETTINGS:   INTAKE / OUTPUT: Intake/Output      04/26 0701 - 04/27 0700 04/27 0701 - 04/28 0700   I.V. (mL/kg) 2740.8 (41)    Other 180    IV Piggyback 1150    Total Intake(mL/kg) 4070.8 (60.8)    Urine (mL/kg/hr) 58 (0)    Emesis/NG output 250 (0.2)    Drains 50 (0)    Stool 0 (0)    Total Output 358     Net +3712.8            PHYSICAL  EXAMINATION: General: Young male, in mild distress. Neuro: A&O x 3, non-focal.  HEENT: [place]/AT. EOMI Cardiovascular: Tachy, regular, no M/R/G.  Lungs: Respirations even and unlabored.  CTA bilaterally, no wehezes. Abdomen: BS absent.  Abd dressing in place.  Left sided JP drain with serosanguinous output, ileostomy in place w/ stool.  Abd soft and appropriately tender post op. Musculoskeletal: No gross deformities, no edema.  Skin: Intact, warm, no appreiable rashes.  LABS:  CBC  Recent Labs Lab 12/05/14 0415 12/06/14 0311  WBC 72.5* 42.6*  HGB 11.6* 7.4*  HCT 36.8* 23.0*  PLT 401* 222   Coag's  Recent Labs Lab 12/04/14 1625  APTT 35  INR 1.52*   BMET  Recent Labs Lab 12/05/14 0415 12/05/14 1657 12/06/14 0311  NA 133* 132* 133*  K 3.5 3.6 3.4*  CL 105 104 107  CO2 17* 19 19  BUN 9 16 21   CREATININE 1.09 1.82* 2.16*  GLUCOSE 191* 145* 120*   Electrolytes  Recent Labs Lab 12/04/14 1650 12/05/14 0415 12/05/14 1657 12/06/14 0311  CALCIUM  --  6.7* 6.3* 6.2*  MG 1.3* 1.1* 1.6 1.5  PHOS 3.6 3.8 3.2  --    Sepsis Markers  Recent Labs Lab 12/05/14 1418 12/05/14 2018 12/06/14 0302  LATICACIDVEN 2.6* 1.8 1.2   ABG No results for input(s): PHART, PCO2ART, PO2ART in the last 168 hours.  Liver Enzymes  Recent Labs Lab 12/04/14 1625 12/06/14 0311  AST 26 18  ALT 8 9  ALKPHOS 111 154*  BILITOT 0.9 0.5  ALBUMIN 1.8* <1.0*   Cardiac Enzymes No results for input(s): TROPONINI, PROBNP in the last 168 hours. Glucose  Recent Labs Lab 12/05/14 2145 12/06/14 0004 12/06/14 0419  GLUCAP 108* 104* 103*    Imaging Dg Chest 1 View  12/04/2014   CLINICAL DATA:  Fever. Nasogastric tube placement. Crohn disease. Anxiety.  EXAM: CHEST  1 VIEW  COMPARISON:  CT of 11/17/2014  FINDINGS: Nasogastric terminates at the body of the stomach. Suspect remote anterior right rib trauma including at the second and possibly the third anterior right ribs. Midline  trachea. Normal heart size. No pleural fluid. Low lung volumes. Patchy bibasilar atelectasis. Free intraperitoneal air under both hemidiaphragms.  IMPRESSION: 1. Appropriate position of nasogastric tube. 2. Extensive free intraperitoneal air. Critical test results telephoned toMaggie, r.n. at the time of interpretation at 4:40 p.m.on . 12/04/2014.   Electronically Signed   By: Abigail Miyamoto M.D.   On: 12/04/2014 16:38   Dg Chest Port 1 View  12/04/2014   CLINICAL DATA:  Central line and NG tube placement.  EXAM: PORTABLE CHEST - 1 VIEW  COMPARISON:  Earlier same day  FINDINGS: Grossly unchanged cardiac silhouette and mediastinal contours. Interval placement of a right jugular approach intravenous catheter with tip projected of the superior cavoatrial junction. Enteric tube tip and side port projects over the expected location of the gastric antrum. No pneumothorax. Apparent resolution of previously noted pneumoperitoneum. Improved aeration of lung bases without focal airspace opacity. No pleural effusion or pneumothorax. Unchanged bones.  IMPRESSION: 1. Appropriately positioned support apparatus as above. No pneumothorax. 2. Apparent resolution of previously noted large amount of pneumoperitoneum. 3. Improved aeration lungs with resolved bibasilar atelectasis.   Electronically Signed   By: Sandi Mariscal M.D.   On: 12/04/2014 21:19   Dg Abd Portable 1v  12/04/2014   CLINICAL DATA:  Nasogastric tube placement.  EXAM: PORTABLE ABDOMEN - 1 VIEW  COMPARISON:  Abdominal CT 11/17/2014  FINDINGS: There is large pneumoperitoneum with Rigler's sign and outlining of the falciform ligament. The colon is distended with high-density material, likely retained contrast from comparison CT. Small bowel dilatation.  Nasogastric tube is in good position. There is a left lower quadrant percutaneous abscess drain.  Critical Value/emergent results were called by telephone at the time of interpretation on 12/04/2014 at 4:50 pm to Dr.  [name] , who verbally acknowledged these results.  IMPRESSION: 1. Large pneumoperitoneum consistent with bowel perforation. 2. Small bowel and colonic distention which could be from low colonic obstruction or ileus. 3. Nasogastric tube is in good position. 4. Left lower quadrant percutaneous abscess drain.   Electronically Signed   By: Monte Fantasia M.D.   On: 12/04/2014 16:51    ASSESSMENT / PLAN:  GASTROINTESTINAL A:   Perforated sigmoid colon, perforated cecal toxic megacolon, and small bowel fistula - s/p ex lap and SBR with anastomosis, total abdominal colectomy with end ileostomy (4/25 - Dr. Donne Hazel) GI prophylaxis Nutrition H/o Crohn's Recent C.diff (was on PO vanc, flagyl, cipro and was being followed by ID) P:   Post op care per CCS. SUP: PPI dc with cdiff, add pepcid NPO. Nutrition timing per CCS. See below for abx Given pre op nutritional status would favor early tpn if not able to feed in 72 hrs  CARDIOVASCULAR CVL R IJ 4/25 >>> R radial A  line 4/25 >>> A:  Septic shock / sirs due to perforated sigmoid colon, perforated cecal toxic megacolon, and small bowel fistula - s/p ex lap and SBR with anastomosis, total abdominal colectomy with end ileostomy (4/25 - Dr. Donne Hazel) Sinus tachycardia - likely due to hypovolemia + pain Lactic acidosis, resolved.  P:  Goal MAP > 60 or sys 90 if mental status ok OFF pressors IVF boluses PRN clinically Allow pos balance further, has responded well Keep line for now Dc a line  PULMONARY A: At risk post op atelectasis Lungs clear P:   Pulmonary hygiene. CXR just back, some edema noted, effusion rt ? Consider lowering volume  RENAL A:   AKI Decreased uop Hyponatremia Hypomagnesemia  Hypokalemia Pseudohypocalcemia - corrects to 8.86 Hypovolemia Acidosis-NONAG P:   Another 1L NS bolus now IVF@ 175, 1/2 NS@125  and 1/4NS+2ap bicarb@50  Bolus with saline ionized calcium pending 4g  IV MgSO4 41mEq KCl IV bmet ,  mg, phos q12h  HEMATOLOGIC A:   Anemia - chronic and in setting of acute blood loss post op, dilution a big deal Thrombocytosis - chronic VTE Prophylaxis Leukocytosis 72.5, reactive and in setting of sepsis P:  Transfuse for Hgb < 7. SCD's / Heparin. CBC in AM  And pm for drop  INFECTIOUS A:   Severe sepsis due to perforated sigmoid colon, perforated cecal toxic megacolon, and small bowel fistula - s/p ex lap and SBR with anastomosis, total abdominal colectomy with end ileostomy (4/25 - Dr. Donne Hazel) Hx C.diff - was on PO cipro , flagyl, vanc Resolved lactic acidosis  P:   BCx2 4/25 > UCx 4/25 > Wound Cx 4/25 > Anaerobic Cx 4/25 > Abx: Vanc, 4/25>>.4/27 Abx: Zosyn 4/25>> Flagyl 4/26>> Antifungal:  Diflucan 4/25>>4/26>>>4/27>>>   Micafungin 4/26>>4/27 Holding PO abx for now we need to continued to treat cdiff regardless of subtotal colectomy ID made changes above, low threshold to change back to myco with risk NONALB in abdo wash out  ENDOCRINE A:   Hyperglycemia R/o rel AI --> cortisol 95.4 P:   SSI if glucose consistently > 150.  NEUROLOGIC A:   Post op pain Hx Anxiety, chronic pain uncontrolled P:   Fentanyl 25 - 100mg  q2hrs PRN. To pca Continue outpatient robaxin. Holding outpatient alprazolam, MS contin, percocet. PRN ativan (high risk WD)  Family updated: 4/26 family at bedside, pt and family updated.  Interdisciplinary Family Meeting v Palliative Care Meeting:  Due by: 5/2.   Otho Bellows, MD Internal Medicine Resident, [facility] Pager: [phone] 12/06/2014 8:04 AM    STAFF NOTE: Linwood Dibbles, MD FACP have personally reviewed patient's available data, including medical history, events of note, physical examination and test results as part of my evaluation. I have discussed with resident/NP and other care providers such as pharmacist, RN and RRT. In addition, I personally evaluated patient and elicited  key findings [id number] stronger, no distress, lungs clear, pcxr c/w some fluid, may need to lower rate volume, off neo, LA cleared, dc bicarb, ABX per ID noted, dc aline  Lavon Paganini. Titus Mould, MD, [place] Pgr: [facility] 12/06/2014 11:15 AM

## 2014-12-07 ENCOUNTER — Inpatient Hospital Stay (HOSPITAL_COMMUNITY): Payer: 59

## 2014-12-07 DIAGNOSIS — R1013 Epigastric pain: Secondary | ICD-10-CM

## 2014-12-07 LAB — GLUCOSE, CAPILLARY
GLUCOSE-CAPILLARY: 80 mg/dL (ref 70–99)
GLUCOSE-CAPILLARY: 84 mg/dL (ref 70–99)
GLUCOSE-CAPILLARY: 93 mg/dL (ref 70–99)
Glucose-Capillary: 84 mg/dL (ref 70–99)
Glucose-Capillary: 76 mg/dL (ref 70–99)
Glucose-Capillary: 81 mg/dL (ref 70–99)

## 2014-12-07 LAB — HEPATIC FUNCTION PANEL
ALT: 11 U/L (ref 0–53)
AST: 16 U/L (ref 0–37)
Albumin: <1 g/dL — ABNORMAL LOW (ref 3.5–5.2)
Alkaline Phosphatase: 78 U/L (ref 39–117)
BILIRUBIN INDIRECT: 0.5 mg/dL (ref 0.3–0.9)
Bilirubin, Direct: 0.1 mg/dL (ref 0.0–0.5)
TOTAL PROTEIN: 3.7 g/dL — AB (ref 6.0–8.3)
Total Bilirubin: 0.6 mg/dL (ref 0.3–1.2)

## 2014-12-07 LAB — BASIC METABOLIC PANEL
ANION GAP: 11 (ref 5–15)
Anion gap: 7 (ref 5–15)
BUN: 28 mg/dL — ABNORMAL HIGH (ref 6–23)
BUN: 29 mg/dL — ABNORMAL HIGH (ref 6–23)
CALCIUM: 6.8 mg/dL — AB (ref 8.4–10.5)
CO2: 18 mmol/L — ABNORMAL LOW (ref 19–32)
CO2: 19 mmol/L (ref 19–32)
CREATININE: 2.73 mg/dL — AB (ref 0.50–1.35)
Calcium: 6.7 mg/dL — ABNORMAL LOW (ref 8.4–10.5)
Chloride: 105 mmol/L (ref 96–112)
Chloride: 105 mmol/L (ref 96–112)
Creatinine, Ser: 2.4 mg/dL — ABNORMAL HIGH (ref 0.50–1.35)
GFR calc Af Amer: 37 mL/min — ABNORMAL LOW (ref >90)
GFR calc non Af Amer: 27 mL/min — ABNORMAL LOW (ref >90)
GFR calc non Af Amer: 32 mL/min — ABNORMAL LOW (ref >90)
GFR, EST AFRICAN AMERICAN: 31 mL/min — AB (ref >90)
GLUCOSE: 86 mg/dL (ref 70–99)
Glucose, Bld: 82 mg/dL (ref 70–99)
Potassium: 3.5 mmol/L (ref 3.5–5.1)
Potassium: 3.3 mmol/L — ABNORMAL LOW (ref 3.5–5.1)
SODIUM: 131 mmol/L — AB (ref 135–145)
Sodium: 134 mmol/L — ABNORMAL LOW (ref 135–145)

## 2014-12-07 LAB — CBC WITH DIFFERENTIAL/PLATELET
BASOS PCT: 0 % (ref 0–1)
BASOS PCT: 0 % (ref 0–1)
Basophils Absolute: 0 10*3/uL (ref 0.0–0.1)
Basophils Absolute: 0 10*3/uL (ref 0.0–0.1)
EOS PCT: 0 % (ref 0–5)
EOS PCT: 0 % (ref 0–5)
Eosinophils Absolute: 0 10*3/uL (ref 0.0–0.7)
Eosinophils Absolute: 0 10*3/uL (ref 0.0–0.7)
HCT: 22.1 % — ABNORMAL LOW (ref 39.0–52.0)
HCT: 21.7 % — ABNORMAL LOW (ref 39.0–52.0)
HEMOGLOBIN: 7 g/dL — AB (ref 13.0–17.0)
Hemoglobin: 7.1 g/dL — ABNORMAL LOW (ref 13.0–17.0)
Lymphocytes Relative: 3 % — ABNORMAL LOW (ref 12–46)
Lymphocytes Relative: 6 % — ABNORMAL LOW (ref 12–46)
Lymphs Abs: 1.5 10*3/uL (ref 0.7–4.0)
Lymphs Abs: 2.6 10*3/uL (ref 0.7–4.0)
MCH: 27.5 pg (ref 26.0–34.0)
MCH: 27.9 pg (ref 26.0–34.0)
MCHC: 32.1 g/dL (ref 30.0–36.0)
MCHC: 32.3 g/dL (ref 30.0–36.0)
MCV: 85.7 fL (ref 78.0–100.0)
MCV: 86.5 fL (ref 78.0–100.0)
MONO ABS: 1.5 10*3/uL — AB (ref 0.1–1.0)
MONOS PCT: 3 % (ref 3–12)
Monocytes Absolute: 1.7 10*3/uL — ABNORMAL HIGH (ref 0.1–1.0)
Monocytes Relative: 4 % (ref 3–12)
NEUTROS PCT: 94 % — AB (ref 43–77)
NEUTROS PCT: 90 % — AB (ref 43–77)
Neutro Abs: 45.7 10*3/uL — ABNORMAL HIGH (ref 1.7–7.7)
Neutro Abs: 38.9 10*3/uL — ABNORMAL HIGH (ref 1.7–7.7)
Platelets: 248 10*3/uL (ref 150–400)
Platelets: 245 10*3/uL (ref 150–400)
RBC: 2.58 MIL/uL — ABNORMAL LOW (ref 4.22–5.81)
RBC: 2.51 MIL/uL — ABNORMAL LOW (ref 4.22–5.81)
RDW: 16.8 % — ABNORMAL HIGH (ref 11.5–15.5)
RDW: 16.9 % — ABNORMAL HIGH (ref 11.5–15.5)
WBC: 48.7 10*3/uL — AB (ref 4.0–10.5)
WBC: 43.2 10*3/uL — AB (ref 4.0–10.5)

## 2014-12-07 LAB — CALCIUM, IONIZED: Calcium, Ionized, Serum: 4.2 mg/dL — ABNORMAL LOW (ref 4.5–5.6)

## 2014-12-07 LAB — PHOSPHORUS
Phosphorus: 4.1 mg/dL (ref 2.3–4.6)
Phosphorus: 3.9 mg/dL (ref 2.3–4.6)

## 2014-12-07 LAB — MAGNESIUM
MAGNESIUM: 2.3 mg/dL (ref 1.5–2.5)
MAGNESIUM: 2.3 mg/dL (ref 1.5–2.5)

## 2014-12-07 LAB — WOUND CULTURE

## 2014-12-07 MED ORDER — FAMOTIDINE IN NACL 20-0.9 MG/50ML-% IV SOLN
20.0000 mg | INTRAVENOUS | Status: DC
  Filled 2014-12-07: qty 50

## 2014-12-07 MED ORDER — FLUCONAZOLE IN SODIUM CHLORIDE 200-0.9 MG/100ML-% IV SOLN
200.0000 mg | INTRAVENOUS | Status: DC
  Administered 2014-12-07 – 2014-12-11 (×5): 200 mg via INTRAVENOUS
  Filled 2014-12-07 (×6): qty 100

## 2014-12-07 MED ORDER — CETYLPYRIDINIUM CHLORIDE 0.05 % MT LIQD
7.0000 mL | Freq: Two times a day (BID) | OROMUCOSAL | Status: DC
  Administered 2014-12-07 – 2014-12-13 (×11): 7 mL via OROMUCOSAL

## 2014-12-07 MED ORDER — STERILE WATER FOR INJECTION IV SOLN
INTRAVENOUS | Status: DC
  Administered 2014-12-07 – 2014-12-08 (×2): via INTRAVENOUS
  Filled 2014-12-07 (×3): qty 9.7

## 2014-12-07 MED ORDER — CHLORHEXIDINE GLUCONATE 0.12 % MT SOLN
15.0000 mL | Freq: Two times a day (BID) | OROMUCOSAL | Status: DC
Start: 2014-12-07 — End: 2014-12-12
  Administered 2014-12-07 – 2014-12-11 (×8): 15 mL via OROMUCOSAL
  Filled 2014-12-07 (×13): qty 15

## 2014-12-07 NOTE — Evaluation (Signed)
Physical Therapy Evaluation Patient Details Name: Vincent Black MRN: [id number] DOB: [birth year] Today's Date: 12/07/2014   History of Present Illness  Pt is 44 yo male with known history of Crohn's disease, several recent admissions in March 2016, discharged on March 1st, 2016 after being treated severe C. difficile colitis complicated by pericolonic abscess requiring drain placement on 10/27/14 and positive for Citrobacter and Klebsiella, most recent discharge on 3/30 after being treated for severe sepsis secondary to intra-abd abscess. Pt has seen Dr. Megan Salon for management of ABX on 11/21/2014, was advised to continue taking antibiotics ciprofloxacin and oral vancomycin until repeat CT abdomen done which was scheduled be done on 12/05/2014. Last CT abdomen was done on 11/17/2014 and showed abscess cavity collapsed. Patient saw Dr. Benson Norway his gastroenterologist earlier in a day for evaluation of sudden onset of hematemesis, generalized weakness, right upper quadrant area pain. Patient explains this started very suddenly, started to throw up massive amounts of gastric content with blood in it. 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  Clinical Impression  Pt admitted with above diagnosis. Pt currently with functional limitations due to the deficits listed below (see PT Problem List). At the time of PT eval pt was limited by fatigue and acute pain. Pt was able to tolerate transfer bed>chair with +2 assist for balance and support. Overall tolerance for functional activity is low at this time. Pt will benefit from skilled PT to increase their independence and safety with mobility to allow discharge to the venue listed below. Feel that pt is a good candidate for a short stay in CIR as pt was very independent PTA and is looking to get back to work.     Follow Up Recommendations CIR;Supervision/Assistance - 24  hour    Equipment Recommendations  Rolling walker with 5" wheels    Recommendations for Other Services Rehab consult     Precautions / Restrictions Precautions Precautions: Fall Precaution Comments: JP drain Restrictions Weight Bearing Restrictions: No      Mobility  Bed Mobility Overal bed mobility: Needs Assistance Bed Mobility: Sit to Sidelying;Rolling Rolling: Supervision       Sit to sidelying: Mod assist General bed mobility comments: VC's for log roll technique, and mod assist to elevate LE's onto bed.   Transfers Overall transfer level: Needs assistance Equipment used: 2 person hand held assist Transfers: Sit to/from [facility] Sit to Stand: Min assist;+2 physical assistance Stand pivot transfers: Min assist;+2 physical assistance       General transfer comment: +2 assist provided for safety and support as pt powered-up to full stand and took pivotal steps around to the bed.   Ambulation/Gait             General Gait Details: Deferred due to pt fatigue and pain.   Stairs            Wheelchair Mobility    Modified Rankin (Stroke Patients Only)       Balance Overall balance assessment: Needs assistance Sitting-balance support: Feet supported;No upper extremity supported Sitting balance-Leahy Scale: Fair     Standing balance support: Bilateral upper extremity supported Standing balance-Leahy Scale: Poor Standing balance comment: Requires UE support to maintain standing balance                             Pertinent Vitals/Pain Pain Assessment: Faces Faces Pain Scale: Hurts  whole lot Pain Location: Abdomen Pain Descriptors / Indicators: Operative site guarding Pain Intervention(s): Limited activity within patient's tolerance;Monitored during session;Repositioned;PCA encouraged    Home Living Family/patient expects to be discharged to:: Private residence Living Arrangements: Spouse/significant  other Available Help at Discharge: Family;Available 24 hours/day Type of Home: House Home Access: Stairs to enter   [employer] of Steps: 3 Home Layout: One level Home Equipment: Cane - single point      Prior Function Level of Independence: Independent with assistive device(s)         Comments: was completely independent up until month ago, began using cane due to weakness due to c-diff     Hand Dominance        Extremity/Trunk Assessment   Upper Extremity Assessment: Defer to OT evaluation;Overall WFL for tasks assessed           Lower Extremity Assessment: Generalized weakness      Cervical / Trunk Assessment: Normal  Communication   Communication: No difficulties  Cognition Arousal/Alertness: Awake/alert Behavior During Therapy: WFL for tasks assessed/performed Overall Cognitive Status: Within Functional Limits for tasks assessed                      General Comments      Exercises        Assessment/Plan    PT Assessment Patient needs continued PT services  PT Diagnosis Difficulty walking;Generalized weakness;Acute pain   PT Problem List Decreased strength;Decreased range of motion;Decreased activity tolerance;Decreased balance;Decreased mobility;Decreased knowledge of use of DME;Decreased safety awareness;Decreased knowledge of precautions;Pain  PT Treatment Interventions DME instruction;Gait training;Stair training;Functional mobility training;Therapeutic activities;Therapeutic exercise;Neuromuscular re-education;Patient/family education   PT Goals (Current goals can be found in the Care Plan section) Acute Rehab PT Goals Patient Stated Goal: Get better PT Goal Formulation: With patient/family Time For Goal Achievement: 12/14/14 Potential to Achieve Goals: Good    Frequency Min 3X/week   Barriers to discharge        Co-evaluation               End of Session Equipment Utilized During Treatment: Gait  belt Activity Tolerance: Patient limited by fatigue;Patient limited by pain Patient left: in bed;with call bell/phone within reach;with nursing/sitter in room;with family/visitor present Nurse Communication: Mobility status         Time: 1225-1241 PT Time Calculation (min) (ACUTE ONLY): 16 min   Charges:   PT Evaluation $Initial PT Evaluation Tier I: 1 Procedure     PT G Codes:        Rolinda Roan 01-02-2015, 1:22 PM   Rolinda Roan, PT, DPT Acute Rehabilitation Services Pager: [phone]

## 2014-12-07 NOTE — Progress Notes (Signed)
Patients hgb 7.0. Dr. Halford Chessman notified. No new orders given. Patient does not appear to have any active bleeding-only serosanguinous JP output. Will continue to monitor.

## 2014-12-07 NOTE — Progress Notes (Signed)
CRITICAL VALUE ALERT  Critical value received:  WBC: 60.9   Date of notification:  12/06/14   Time of notification:  1930  Critical value read back:Yes.    Nurse who received alert:  Corinda Gubler  MD notified (1st page):  Dr. Posey Pronto  Time:  2015; Critical lab given in person

## 2014-12-07 NOTE — Progress Notes (Signed)
PULMONARY / CRITICAL CARE MEDICINE   Name: Vincent Black MRN: [id number] DOB: 07-Oct-1970    ADMISSION DATE:  12/04/2014 CONSULTATION DATE:  12/07/2014  REFERRING MD :  Donne Hazel  CHIEF COMPLAINT:  Abd pain  INITIAL PRESENTATION:  44 y.o. M with Crohn's and C.diff brought to [facility] 4/25 for worsening abdominal pain.  Found to have perforated sigmoid colon, perforated cecal toxic megacolon, and small bowel fistula.  He was taken to the OR for ex lap and small bowel resection with anastomosis, total abdominal colectomy with end ileostomy.  Due to significant stool contamination, he was transferred to the ICU post op for close monitoring.  STUDIES:  CXR 4/25 >>> extensive free intraperitoneal air.  AXR 4/25 >>> large pneumoperitoneum c/w bowel perforation.  Small bowel and colonic distention, LLQ percutaneous abscess drain.  SIGNIFICANT EVENTS: 4/25: Admitted for severe abd pain.  Underwent ex lap with SB resection and total abdominal colectomy with end ileostomy. 4/26: Poor uop despite increased IVF and 4L in boluses, shock pressors 4/27: Improved LA, with increased output urine. Off Neo  SUBJECTIVE:   Improved uop. Pain controlled with dilaudid PA. C/o pain at ileostomy site this morning.  VITAL SIGNS: Temp:  [97.5 F (36.4 C)-98.5 F (36.9 C)] 98.2 F (36.8 C) (04/28 0400) Pulse Rate:  [102-128] 116 (04/28 0700) Resp:  [10-20] 11 (04/28 0700) BP: (105-141)/(63-95) 135/78 mmHg (04/28 0700) SpO2:  [93 %-100 %] 98 % (04/28 0700) HEMODYNAMICS: CVP:  [3 mmHg-10 mmHg] 5 mmHg VENTILATOR SETTINGS:   INTAKE / OUTPUT: Intake/Output      04/27 0701 - 04/28 0700 04/28 0701 - 04/29 0700   P.O. 600    I.V. (mL/kg) 3533.3 (52.8)    Other     IV Piggyback 950    Total Intake(mL/kg) 5083.3 (76)    Urine (mL/kg/hr) 1035 (0.6)    Emesis/NG output 200 (0.1)    Drains 713 (0.4)    Stool 300 (0.2)    Total Output 2248     Net +2835.3            PHYSICAL EXAMINATION: General: Young male,  in mild distress. Neuro: A&O x 3, non-focal.  HEENT: [place]/AT. EOMI Cardiovascular: Tachy mild and improved, regular, no M/R/G.  Lungs: Respirations even and unlabored.  CTA bilaterally, no wheezes. Abdomen: BS absent.  Abd dressing in place.  Left sided JP drain with serosanguinous output, ileostomy in place w/ stool.  Abd soft and appropriately tender. Musculoskeletal: No gross deformities, no edema.  Skin: Intact, warm, no appreiable rashes.  LABS:  CBC  Recent Labs Lab 12/05/14 0415 12/06/14 0311 12/06/14 1830  WBC 72.5* 42.6* 60.9*  HGB 11.6* 7.4* 8.1*  HCT 36.8* 23.0* 25.8*  PLT 401* 222 280   Coag's  Recent Labs Lab 12/04/14 1625  APTT 35  INR 1.52*   BMET  Recent Labs Lab 12/05/14 1657 12/06/14 0311 12/06/14 1830  NA 132* 133* 132*  K 3.6 3.4* 3.8  CL 104 107 107  CO2 19 19 16*  BUN 16 21 27*  CREATININE 1.82* 2.16* 2.49*  GLUCOSE 145* 120* 123*   Electrolytes  Recent Labs Lab 12/05/14 0415 12/05/14 1657 12/06/14 0311 12/06/14 1830  CALCIUM 6.7* 6.3* 6.2* 6.8*  MG 1.1* 1.6 1.5 2.6*  PHOS 3.8 3.2  --  4.2   Sepsis Markers  Recent Labs Lab 12/05/14 1418 12/05/14 2018 12/06/14 0302  LATICACIDVEN 2.6* 1.8 1.2   ABG No results for input(s): PHART, PCO2ART, PO2ART in the last 168 hours.  Liver Enzymes  Recent Labs Lab 12/04/14 1625 12/06/14 0311 12/07/14 0445  AST 26 18 16   ALT 8 9 11   ALKPHOS 111 154* 78  BILITOT 0.9 0.5 0.6  ALBUMIN 1.8* <1.0* <1.0*   Cardiac Enzymes No results for input(s): TROPONINI, PROBNP in the last 168 hours. Glucose  Recent Labs Lab 12/06/14 0757 12/06/14 1138 12/06/14 1617 12/06/14 2020 12/06/14 2351 12/07/14 0451  GLUCAP 85 100* 85 102* 93 84    Imaging Dg Chest Port 1 View  12/06/2014   CLINICAL DATA:  Edema  EXAM: PORTABLE CHEST - 1 VIEW  COMPARISON:  12/04/2014  FINDINGS: Low lung volumes with vascular crowding. Possible mild interstitial edema with bibasilar atelectasis. No  pneumothorax.  The heart is normal in size.  Right IJ venous catheter terminates at the cavoatrial junction.  Enteric tube traverses in the distal gastric antrum.  Lucency beneath the right hemidiaphragm could suggest free air related to recent surgery.  IMPRESSION: Low lung volumes with vascular crowding and possible mild interstitial edema.  Bibasilar atelectasis.  Support apparatus as above.   Electronically Signed   By: Julian Hy M.D.   On: 12/06/2014 09:45   Dg Abd Portable 1v  12/07/2014   CLINICAL DATA:  Nasogastric tube placement.  Initial encounter.  EXAM: PORTABLE ABDOMEN - 1 VIEW  COMPARISON:  Abdominal radiograph performed 12/04/2014  FINDINGS: The patient's enteric tube is noted ending overlying the body of the stomach.  The visualized bowel gas pattern is unremarkable. A bowel suture line is noted at the left lower quadrant. An ostomy is seen at the right lower quadrant. Scattered air and stool filled loops of colon are seen; no abnormal dilatation of small bowel loops is seen to suggest small bowel obstruction. No free intra-abdominal air is identified, though evaluation for free air is limited on a single supine view.  The visualized osseous structures are within normal limits; the sacroiliac joints are unremarkable in appearance.  A drainage catheter is noted overlying the upper pelvis.  IMPRESSION: 1. Enteric tube noted ending overlying the body of the stomach. 2. Unremarkable bowel gas pattern; postoperative change at the lower quadrants. No free intra-abdominal air seen.   Electronically Signed   By: Garald Balding M.D.   On: 12/07/2014 06:32    ASSESSMENT / PLAN:  GASTROINTESTINAL A:   Perforated sigmoid colon, perforated cecal toxic megacolon, and small bowel fistula - s/p ex lap and SBR with anastomosis, total abdominal colectomy with end ileostomy (4/25 - Dr. Donne Hazel) H/o Crohn's Recent C.diff (was on PO vanc, flagyl, cipro and was being followed by ID) P:   Post op  care per [facility]. SUP: pepcid NPO Nutrition timing per [facility]. See below for abx Given preop nutritional status consider starting TPN, per general  CARDIOVASCULAR CVL R IJ 4/25 >>> R radial A line 4/25 >>> A:  Septic shock/SIRS due to perforated sigmoid colon, perforated cecal toxic megacolon, and small bowel fistula - s/p ex lap and SBR with anastomosis, total abdominal colectomy with end ileostomy (4/25 - Dr. Donne Hazel) Sinus tachycardia Lactic acidosis, resolved.  P:  Goal MAP > 60 or sys 90 if mental status ok OFF pressors IVF boluses PRN clinically Allow pos balance further, has responded well Keep central line for now, may need tpn anyway A-line out 4/27  PULMONARY A: At risk post op atelectasis Lungs clear P:   Pulmonary hygiene. Pulmonary edema on CXR 4/27 A CXR not overwhelmed for edema Need to stand, ambulate IS  RENAL A:  AKI Decreased uop, improving Hyponatremia Hypomagnesemia, resolved Hypokalemia, resolved Hypocalcemia -ionized Ca 4.2 Hypovolemia, resolved  Acidosis-NONAG, increased P:   Restart 2amp bicarb in 1/4 NS @50  bmet , mg, phos q12h Urine output is encouraging  HEMATOLOGIC A:   Anemia - chronic and in setting of acute blood loss post op, dilution a big deal Thrombocytosis - chronic VTE Prophylaxis Leukocytosis 72.5, reactive and in setting of sepsis P:  Transfuse for Hgb < 7. SCD's / Heparin. CBC in AM pending with pos balance  INFECTIOUS A:   Severe sepsis due to perforated sigmoid colon, perforated cecal toxic megacolon, and small bowel fistula - s/p ex lap and SBR with anastomosis, total abdominal colectomy with end ileostomy (4/25 - Dr. Donne Hazel) Hx C.diff - was on PO cipro , flagyl, vanc Resolved lactic acidosis  P:   BCx2 4/25 > UCx 4/25 > Wound Cx 4/25 > few candida albicans Anaerobic Cx 4/25 > Abx: Vanc, 4/25>>.4/27 Abx: Zosyn 4/25>> Flagyl 4/26>> Antifungal:  Diflucan 4/25>>4/26>>>4/27>>>   Micafungin  4/26>>4/27 Continued to treat C.diff regardless of subtotal colectomy ID made changes above, low threshold to change back to myco with risk NON-albicans   ENDOCRINE A:   Hyperglycemia, resolved R/o rel AI --> cortisol 95.4 P:   SSI if glucose consistently > 150.  NEUROLOGIC A:   Post op pain Hx Anxiety, chronic pain uncontrolled P:   PCA Robaxin IV Holding outpatient alprazolam, MS contin, percocet. PRN ativan (high risk WD)  Family updated: 4/26 family at bedside, pt and family updated.  Interdisciplinary Family Meeting v Palliative Care Meeting:  Due by: 5/2.   Otho Bellows, MD Internal Medicine Resident, [facility] Pager: [phone] 12/07/2014 7:22 AM     STAFF NOTE: Linwood Dibbles, MD FACP have personally reviewed patient's available data, including medical history, events of note, physical examination and test results as part of my evaluation. I have discussed with resident/NP and other care providers such as pharmacist, RN and RRT. In addition, I personally evaluated patient and elicited key findings of: in a chair, lungs clear to bases, tolerating pos balance further, anticipate crt drop in am with urine noted, if rises then renal US needed, may need tpn, keep line for now, pca, consider to sdu, re add bicarb drip, still with NONAG  Lavon Paganini. Titus Mould, MD, [place] Pgr: [facility] 12/07/2014 10:33 AM

## 2014-12-07 NOTE — Progress Notes (Signed)
[facility] for Infectious Disease  Date of Admission:  12/04/2014  Antibiotics: IV Vancomycin : 4/25 >>>4/27 IV Zosyn : 4/25 >>> IV Flagyl : 4/26 >>> PO Cipro: 4/25 x1  Subjective: Patient feeling better, slightly more energy, less nausea  Objective: Temp:  [97.4 F (36.3 C)-98.5 F (36.9 C)] 97.4 F (36.3 C) (04/28 0852) Pulse Rate:  [102-128] 115 (04/28 0900) Resp:  [10-17] 14 (04/28 0900) BP: (105-145)/(63-95) 145/83 mmHg (04/28 0800) SpO2:  [93 %-100 %] 100 % (04/28 0900)  General: NAD in bed Skin: warm, dry Lungs: CTAB Cor: Tachycardic, regular, no appreciated murmur Abdomen: ileostomy in place with liquid stool, JP drain with moderate amount of serosanguinous output, tenderness around surgical sites.   Lab Results Lab Results  Component Value Date   WBC 48.7* 12/07/2014   HGB 7.1* 12/07/2014   HCT 22.1* 12/07/2014   MCV 85.7 12/07/2014   PLT 248 12/07/2014    Lab Results  Component Value Date   CREATININE 2.73* 12/07/2014   BUN 28* 12/07/2014   NA 134* 12/07/2014   K 3.5 12/07/2014   CL 105 12/07/2014   CO2 18* 12/07/2014    Lab Results  Component Value Date   ALT 11 12/07/2014   AST 16 12/07/2014   ALKPHOS 78 12/07/2014   BILITOT 0.6 12/07/2014      Microbiology: Recent Results (from the past 240 hour(s))  Culture, blood (x 2)     Status: None (Preliminary result)   Collection Time: 12/04/14  4:50 PM  Result Value Ref Range Status   Specimen Description BLOOD LEFT HAND  Final   Special Requests BOTTLES DRAWN AEROBIC ONLY 2CC  Final   Culture   Final           BLOOD CULTURE RECEIVED NO GROWTH TO DATE CULTURE WILL BE HELD FOR 5 DAYS BEFORE ISSUING A FINAL NEGATIVE REPORT Performed at [facility]    Report Status PENDING  Incomplete  Culture, blood (x 2)     Status: None (Preliminary result)   Collection Time: 12/04/14  4:50 PM  Result Value Ref Range Status   Specimen Description BLOOD RIGHT HAND  Final   Special Requests  BOTTLES DRAWN AEROBIC ONLY 5CC  Final   Culture   Final           BLOOD CULTURE RECEIVED NO GROWTH TO DATE CULTURE WILL BE HELD FOR 5 DAYS BEFORE ISSUING A FINAL NEGATIVE REPORT Performed at [facility]    Report Status PENDING  Incomplete  MRSA PCR Screening     Status: None   Collection Time: 12/04/14  5:32 PM  Result Value Ref Range Status   MRSA by PCR NEGATIVE NEGATIVE Final    Comment:        The GeneXpert MRSA Assay (FDA approved for NASAL specimens only), is one component of a comprehensive MRSA colonization surveillance program. It is not intended to diagnose MRSA infection nor to guide or monitor treatment for MRSA infections.   Wound culture     Status: None   Collection Time: 12/04/14  7:10 PM  Result Value Ref Range Status   Specimen Description ABDOMEN  Final   Special Requests PATIENT ON FOLLOWING ZINACEF  Final   Gram Stain   Final    FEW WBC PRESENT,BOTH PMN AND MONONUCLEAR NO SQUAMOUS EPITHELIAL CELLS SEEN NO ORGANISMS SEEN Performed at [facility]    Culture   Final    FEW CANDIDA ALBICANS Performed at [facility]    Report Status 12/07/2014 FINAL  Final  Anaerobic culture     Status: None (Preliminary result)   Collection Time: 12/04/14  7:10 PM  Result Value Ref Range Status   Specimen Description WOUND ABDOMEN  Final   Special Requests PT ON ZINACEF  Final   Gram Stain   Final    FEW WBC PRESENT,BOTH PMN AND MONONUCLEAR NO SQUAMOUS EPITHELIAL CELLS SEEN NO ORGANISMS SEEN Performed at [facility]    Culture   Final    NO ANAEROBES ISOLATED; CULTURE IN PROGRESS FOR 5 DAYS Performed at [facility]    Report Status PENDING  Incomplete    Studies/Results: Dg Chest Port 1 View  12/07/2014   CLINICAL DATA:  Pulmonary edema.  EXAM: PORTABLE CHEST - 1 VIEW  COMPARISON:  12/06/2014  FINDINGS: Low lung volumes with bibasilar atelectasis and vascular congestion, similar to prior study. Lucency under the  right hemidiaphragm again noted, possibly pneumoperitoneum. NG tube and right central line remain in place, unchanged.  IMPRESSION: Low lung volumes with bibasilar atelectasis.  Lucency under the right hemidiaphragm again noted, possibly pneumoperitoneum related to recent surgery. May consider decubitus view of the abdomen to assess degree of pneumoperitoneum if felt clinically indicated.   Electronically Signed   By: Rolm Baptise M.D.   On: 12/07/2014 08:50   Dg Chest Port 1 View  12/06/2014   CLINICAL DATA:  Edema  EXAM: PORTABLE CHEST - 1 VIEW  COMPARISON:  12/04/2014  FINDINGS: Low lung volumes with vascular crowding. Possible mild interstitial edema with bibasilar atelectasis. No pneumothorax.  The heart is normal in size.  Right IJ venous catheter terminates at the cavoatrial junction.  Enteric tube traverses in the distal gastric antrum.  Lucency beneath the right hemidiaphragm could suggest free air related to recent surgery.  IMPRESSION: Low lung volumes with vascular crowding and possible mild interstitial edema.  Bibasilar atelectasis.  Support apparatus as above.   Electronically Signed   By: Julian Hy M.D.   On: 12/06/2014 09:45   Dg Abd Portable 1v  12/07/2014   CLINICAL DATA:  Nasogastric tube placement.  Initial encounter.  EXAM: PORTABLE ABDOMEN - 1 VIEW  COMPARISON:  Abdominal radiograph performed 12/04/2014  FINDINGS: The patient's enteric tube is noted ending overlying the body of the stomach.  The visualized bowel gas pattern is unremarkable. A bowel suture line is noted at the left lower quadrant. An ostomy is seen at the right lower quadrant. Scattered air and stool filled loops of colon are seen; no abnormal dilatation of small bowel loops is seen to suggest small bowel obstruction. No free intra-abdominal air is identified, though evaluation for free air is limited on a single supine view.  The visualized osseous structures are within normal limits; the sacroiliac joints are  unremarkable in appearance.  A drainage catheter is noted overlying the upper pelvis.  IMPRESSION: 1. Enteric tube noted ending overlying the body of the stomach. 2. Unremarkable bowel gas pattern; postoperative change at the lower quadrants. No free intra-abdominal air seen.   Electronically Signed   By: Garald Balding M.D.   On: 12/07/2014 06:32    Assessment/Plan: Septic shock due to perforated cecum and sigmoid colon (improving now off pressors) Hx of C diff (s/p total colectomy)  Leukemoid reaction (improving) Malnourished (albumin <1.0) Hx Esophageal Candidiasis   - Continue IV Zosyn, diflucan (C Albicans isolated), and Flagyl ( Hx C diff, possible illeum enteritis after total colectomy) - If taking  PO can change to oral Vancomycin  Lucious Groves, DO IMTS PGY-2 12/07/2014, 10:32 AM

## 2014-12-07 NOTE — Progress Notes (Signed)
3 Days Post-Op  Subjective: Feels well, sore at incision, discussed operation with him today, no n/v  Objective: Vital signs in last 24 hours: Temp:  [97.4 F (36.3 C)-98.5 F (36.9 C)] 97.4 F (36.3 C) (04/28 0852) Pulse Rate:  [102-128] 115 (04/28 0900) Resp:  [10-17] 14 (04/28 0900) BP: (105-145)/(63-95) 145/83 mmHg (04/28 0800) SpO2:  [93 %-100 %] 100 % (04/28 0900)    Intake/Output from previous day: 04/27 0701 - 04/28 0700 In: 5083.3 [P.O.:600; I.V.:3533.3; IV Piggyback:950] Out: 2398 [Urine:1035; Emesis/NG output:350; Drains:713; Stool:300] Intake/Output this shift: Total I/O In: 175 [I.V.:150; IV Piggyback:25] Out: 150 [Urine:150]  GI: prior drain site clean without infection, jp with serous fluid, incision open and clean, ileostomy functional, approp tender, some bs present  Lab Results:   Recent Labs  12/06/14 1830 12/07/14 0746  WBC 60.9* 48.7*  HGB 8.1* 7.1*  HCT 25.8* 22.1*  PLT 280 248   BMET  Recent Labs  12/06/14 1830 12/07/14 0746  NA 132* 134*  K 3.8 3.5  CL 107 105  CO2 16* 18*  GLUCOSE 123* 82  BUN 27* 28*  CREATININE 2.49* 2.73*  CALCIUM 6.8* 6.8*   PT/INR  Recent Labs  12/04/14 1625  LABPROT 18.4*  INR 1.52*   ABG No results for input(s): PHART, HCO3 in the last 72 hours.  Invalid input(s): PCO2, PO2  Studies/Results: Dg Chest Port 1 View  12/07/2014   CLINICAL DATA:  Pulmonary edema.  EXAM: PORTABLE CHEST - 1 VIEW  COMPARISON:  12/06/2014  FINDINGS: Low lung volumes with bibasilar atelectasis and vascular congestion, similar to prior study. Lucency under the right hemidiaphragm again noted, possibly pneumoperitoneum. NG tube and right central line remain in place, unchanged.  IMPRESSION: Low lung volumes with bibasilar atelectasis.  Lucency under the right hemidiaphragm again noted, possibly pneumoperitoneum related to recent surgery. May consider decubitus view of the abdomen to assess degree of pneumoperitoneum if felt  clinically indicated.   Electronically Signed   By: Rolm Baptise M.D.   On: 12/07/2014 08:50   Dg Chest Port 1 View  12/06/2014   CLINICAL DATA:  Edema  EXAM: PORTABLE CHEST - 1 VIEW  COMPARISON:  12/04/2014  FINDINGS: Low lung volumes with vascular crowding. Possible mild interstitial edema with bibasilar atelectasis. No pneumothorax.  The heart is normal in size.  Right IJ venous catheter terminates at the cavoatrial junction.  Enteric tube traverses in the distal gastric antrum.  Lucency beneath the right hemidiaphragm could suggest free air related to recent surgery.  IMPRESSION: Low lung volumes with vascular crowding and possible mild interstitial edema.  Bibasilar atelectasis.  Support apparatus as above.   Electronically Signed   By: Julian Hy M.D.   On: 12/06/2014 09:45   Dg Abd Portable 1v  12/07/2014   CLINICAL DATA:  Nasogastric tube placement.  Initial encounter.  EXAM: PORTABLE ABDOMEN - 1 VIEW  COMPARISON:  Abdominal radiograph performed 12/04/2014  FINDINGS: The patient's enteric tube is noted ending overlying the body of the stomach.  The visualized bowel gas pattern is unremarkable. A bowel suture line is noted at the left lower quadrant. An ostomy is seen at the right lower quadrant. Scattered air and stool filled loops of colon are seen; no abnormal dilatation of small bowel loops is seen to suggest small bowel obstruction. No free intra-abdominal air is identified, though evaluation for free air is limited on a single supine view.  The visualized osseous structures are within normal limits; the sacroiliac joints  are unremarkable in appearance.  A drainage catheter is noted overlying the upper pelvis.  IMPRESSION: 1. Enteric tube noted ending overlying the body of the stomach. 2. Unremarkable bowel gas pattern; postoperative change at the lower quadrants. No free intra-abdominal air seen.   Electronically Signed   By: Garald Balding M.D.   On: 12/07/2014 06:32     Anti-infectives: Anti-infectives    Start     Dose/Rate Route Frequency Ordered Stop   12/06/14 1300  fluconazole (DIFLUCAN) IVPB 400 mg     400 mg 100 mL/hr over 120 Minutes Intravenous Every 24 hours 12/06/14 1119     12/06/14 0000  micafungin (MYCAMINE) 100 mg in sodium chloride 0.9 % 100 mL IVPB  Status:  Discontinued     100 mg 100 mL/hr over 1 Hours Intravenous Daily 12/05/14 1108 12/05/14 1332   12/05/14 1500  micafungin (MYCAMINE) 100 mg in sodium chloride 0.9 % 100 mL IVPB  Status:  Discontinued     100 mg 100 mL/hr over 1 Hours Intravenous Daily 12/05/14 1332 12/06/14 1045   12/05/14 1200  metroNIDAZOLE (FLAGYL) IVPB 500 mg     500 mg 100 mL/hr over 60 Minutes Intravenous Every 8 hours 12/05/14 1108     12/05/14 1000  micafungin (MYCAMINE) 100 mg in sodium chloride 0.9 % 100 mL IVPB  Status:  Discontinued     100 mg 100 mL/hr over 1 Hours Intravenous Daily 12/04/14 2255 12/04/14 2343   12/05/14 0000  fluconazole (DIFLUCAN) IVPB 200 mg  Status:  Discontinued     200 mg 100 mL/hr over 60 Minutes Intravenous Every 24 hours 12/04/14 2259 12/05/14 1107   12/05/14 0000  vancomycin (VANCOCIN) IVPB 1000 mg/200 mL premix  Status:  Discontinued     1,000 mg 200 mL/hr over 60 Minutes Intravenous Every 8 hours 12/04/14 2356 12/06/14 0850   12/04/14 1515  piperacillin-tazobactam (ZOSYN) IVPB 3.375 g     3.375 g 12.5 mL/hr over 240 Minutes Intravenous Every 8 hours 12/04/14 1510     12/04/14 1415  vancomycin (VANCOCIN) 50 mg/mL oral solution 125 mg  Status:  Discontinued     125 mg Oral 4 times per day 12/04/14 1402 12/04/14 2346   12/04/14 1415  ciprofloxacin (CIPRO) IVPB 400 mg  Status:  Discontinued     400 mg 200 mL/hr over 60 Minutes Intravenous Every 12 hours 12/04/14 1402 12/04/14 1504      Assessment/Plan: POD 3 elap/tac/sbr with anastomosis  1. Continue pca until tolerating oral intake well 2. Tachycardia persists but better than initial, should resolve over time 3.  pulm toilet 4. Ng out, will start clears today if tolerates hopefully can avoid tna 5. Will dc wet to dry dressing changes today, can have vac placed on incision 6. Follow creatinine, if continues to rise will need renal US 7. Heparin, scds, pepcid  [facility] 12/07/2014

## 2014-12-07 NOTE — Progress Notes (Signed)
Subjective: Continues to improve.  Pain with exhalation at the incision site and the ileostomy site.  Objective: Vital signs in last 24 hours: Temp:  [97.5 F (36.4 C)-98.5 F (36.9 C)] 98.2 F (36.8 C) (04/28 0400) Pulse Rate:  [102-128] 116 (04/28 0700) Resp:  [10-20] 13 (04/28 0735) BP: (105-141)/(63-95) 135/78 mmHg (04/28 0700) SpO2:  [93 %-100 %] 98 % (04/28 0735)    Intake/Output from previous day: 04/27 0701 - 04/28 0700 In: 5083.3 [P.O.:600; I.V.:3533.3; IV Piggyback:950] Out: 2398 [Urine:1035; Emesis/NG output:350; Drains:713; TZGYF:749] Intake/Output this shift:    General appearance: alert and no distress GI: tender at the incision site  Lab Results:  Recent Labs  12/05/14 0415 12/06/14 0311 12/06/14 1830  WBC 72.5* 42.6* 60.9*  HGB 11.6* 7.4* 8.1*  HCT 36.8* 23.0* 25.8*  PLT 401* 222 280   BMET  Recent Labs  12/05/14 1657 12/06/14 0311 12/06/14 1830  NA 132* 133* 132*  K 3.6 3.4* 3.8  CL 104 107 107  CO2 19 19 16*  GLUCOSE 145* 120* 123*  BUN 16 21 27*  CREATININE 1.82* 2.16* 2.49*  CALCIUM 6.3* 6.2* 6.8*   LFT  Recent Labs  12/07/14 0445  PROT 3.7*  ALBUMIN <1.0*  AST 16  ALT 11  ALKPHOS 78  BILITOT 0.6  BILIDIR 0.1  IBILI 0.5   PT/INR  Recent Labs  12/04/14 1625  LABPROT 18.4*  INR 1.52*   Hepatitis Panel No results for input(s): HEPBSAG, HCVAB, HEPAIGM, HEPBIGM in the last 72 hours. C-Diff No results for input(s): CDIFFTOX in the last 72 hours. Fecal Lactopherrin No results for input(s): FECLLACTOFRN in the last 72 hours.  Studies/Results: Dg Chest Port 1 View  12/06/2014   CLINICAL DATA:  Edema  EXAM: PORTABLE CHEST - 1 VIEW  COMPARISON:  12/04/2014  FINDINGS: Low lung volumes with vascular crowding. Possible mild interstitial edema with bibasilar atelectasis. No pneumothorax.  The heart is normal in size.  Right IJ venous catheter terminates at the cavoatrial junction.  Enteric tube traverses in the distal gastric  antrum.  Lucency beneath the right hemidiaphragm could suggest free air related to recent surgery.  IMPRESSION: Low lung volumes with vascular crowding and possible mild interstitial edema.  Bibasilar atelectasis.  Support apparatus as above.   Electronically Signed   By: Julian Hy M.D.   On: 12/06/2014 09:45   Dg Abd Portable 1v  12/07/2014   CLINICAL DATA:  Nasogastric tube placement.  Initial encounter.  EXAM: PORTABLE ABDOMEN - 1 VIEW  COMPARISON:  Abdominal radiograph performed 12/04/2014  FINDINGS: The patient's enteric tube is noted ending overlying the body of the stomach.  The visualized bowel gas pattern is unremarkable. A bowel suture line is noted at the left lower quadrant. An ostomy is seen at the right lower quadrant. Scattered air and stool filled loops of colon are seen; no abnormal dilatation of small bowel loops is seen to suggest small bowel obstruction. No free intra-abdominal air is identified, though evaluation for free air is limited on a single supine view.  The visualized osseous structures are within normal limits; the sacroiliac joints are unremarkable in appearance.  A drainage catheter is noted overlying the upper pelvis.  IMPRESSION: 1. Enteric tube noted ending overlying the body of the stomach. 2. Unremarkable bowel gas pattern; postoperative change at the lower quadrants. No free intra-abdominal air seen.   Electronically Signed   By: Garald Balding M.D.   On: 12/07/2014 06:32    Medications:  Scheduled: .  antiseptic oral rinse  7 mL Mouth Rinse BID  . famotidine (PEPCID) IV  20 mg Intravenous Q12H  . fluconazole (DIFLUCAN) IV  400 mg Intravenous Q24H  . heparin subcutaneous  5,000 Units Subcutaneous 3 times per day  .  HYDROmorphone (DILAUDID) injection  1 mg Intravenous Once  . HYDROmorphone PCA 0.3 mg/mL   Intravenous 6 times per day  . insulin aspart  0-9 Units Subcutaneous 6 times per day  . metronidazole  500 mg Intravenous Q8H  .  piperacillin-tazobactam (ZOSYN)  IV  3.375 g Intravenous Q8H   Continuous: . sodium chloride 75 mL/hr at 12/06/14 2354  . 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 2) Improved urine output.   The patient continues to improve.  I have followed him for a long time and he is definitely getting closer to his original baseline of quiescent disease.  I am encouraged with the improvement.  Plan: 1) Continue with supportive care.   LOS: 3 days   Edra Riccardi D 12/07/2014, 7:40 AM

## 2014-12-07 NOTE — Consult Note (Signed)
WOC follow-up: Requested to apply Vac to abd wound beginning on Friday.  Will apply tomorrow as requested by CCS team. Julien Girt MSN, RN, [place], [place], [place]

## 2014-12-07 NOTE — Progress Notes (Signed)
ANTIBIOTIC CONSULT NOTE - INITIAL  Pharmacy Consult for fluconazole and zosyn Indication: sepsis  No Known Allergies  Patient Measurements: Height: 5\' 9"  (175.3 cm) Weight: 147 lb 7.8 oz (66.9 kg) IBW/kg (Calculated) : 70.7  Vital Signs: Temp: 97.6 F (36.4 C) (04/28 1136) Temp Source: Oral (04/28 1136) BP: 137/84 mmHg (04/28 1015) Pulse Rate: 109 (04/28 1200) Intake/Output from previous day: 04/27 0701 - 04/28 0700 In: 5083.3 [P.O.:600; I.V.:3533.3; IV Piggyback:950] Out: 2498 [Urine:1035; Emesis/NG output:350; Drains:813; Stool:300] Intake/Output from this shift: Total I/O In: 450 [I.V.:350; IV Piggyback:100] Out: 630 [Urine:250; Emesis/NG output:100; Drains:280]  Labs:  Recent Labs  12/06/14 0311 12/06/14 1830 12/07/14 0746  WBC 42.6* 60.9* 48.7*  HGB 7.4* 8.1* 7.1*  PLT 222 280 248  CREATININE 2.16* 2.49* 2.73*   Estimated Creatinine Clearance: 33.4 mL/min (by C-G formula based on Cr of 2.73). No results for input(s): VANCOTROUGH, VANCOPEAK, VANCORANDOM, GENTTROUGH, GENTPEAK, GENTRANDOM, TOBRATROUGH, TOBRAPEAK, TOBRARND, AMIKACINPEAK, AMIKACINTROU, AMIKACIN in the last 72 hours.   Microbiology: Recent Results (from the past 720 hour(s))  Culture, blood (x 2)     Status: None (Preliminary result)   Collection Time: 12/04/14  4:50 PM  Result Value Ref Range Status   Specimen Description BLOOD LEFT HAND  Final   Special Requests BOTTLES DRAWN AEROBIC ONLY 2CC  Final   Culture   Final           BLOOD CULTURE RECEIVED NO GROWTH TO DATE CULTURE WILL BE HELD FOR 5 DAYS BEFORE ISSUING A FINAL NEGATIVE REPORT Performed at [facility]    Report Status PENDING  Incomplete  Culture, blood (x 2)     Status: None (Preliminary result)   Collection Time: 12/04/14  4:50 PM  Result Value Ref Range Status   Specimen Description BLOOD RIGHT HAND  Final   Special Requests BOTTLES DRAWN AEROBIC ONLY 5CC  Final   Culture   Final           BLOOD CULTURE RECEIVED NO  GROWTH TO DATE CULTURE WILL BE HELD FOR 5 DAYS BEFORE ISSUING A FINAL NEGATIVE REPORT Performed at [facility]    Report Status PENDING  Incomplete  MRSA PCR Screening     Status: None   Collection Time: 12/04/14  5:32 PM  Result Value Ref Range Status   MRSA by PCR NEGATIVE NEGATIVE Final    Comment:        The GeneXpert MRSA Assay (FDA approved for NASAL specimens only), is one component of a comprehensive MRSA colonization surveillance program. It is not intended to diagnose MRSA infection nor to guide or monitor treatment for MRSA infections.   Wound culture     Status: None   Collection Time: 12/04/14  7:10 PM  Result Value Ref Range Status   Specimen Description ABDOMEN  Final   Special Requests PATIENT ON FOLLOWING ZINACEF  Final   Gram Stain   Final    FEW WBC PRESENT,BOTH PMN AND MONONUCLEAR NO SQUAMOUS EPITHELIAL CELLS SEEN NO ORGANISMS SEEN Performed at [facility]    Culture   Final    FEW CANDIDA ALBICANS Performed at [facility]    Report Status 12/07/2014 FINAL  Final  Anaerobic culture     Status: None (Preliminary result)   Collection Time: 12/04/14  7:10 PM  Result Value Ref Range Status   Specimen Description WOUND ABDOMEN  Final   Special Requests PT ON ZINACEF  Final   Gram Stain   Final  FEW WBC PRESENT,BOTH PMN AND MONONUCLEAR NO SQUAMOUS EPITHELIAL CELLS SEEN NO ORGANISMS SEEN Performed at [facility]    Culture   Final    NO ANAEROBES ISOLATED; CULTURE IN PROGRESS FOR 5 DAYS Performed at [facility]    Report Status PENDING  Incomplete    Medical History: Past Medical History  Diagnosis Date  . Crohn disease   . C. difficile colitis 09/30/2014  . Ankylosing spondylitis 09/30/2014  . Anxiety     Medications:  Scheduled:  . antiseptic oral rinse  7 mL Mouth Rinse q12n4p  . chlorhexidine  15 mL Mouth Rinse BID  . [START ON 12/08/2014] famotidine (PEPCID) IV  20 mg Intravenous Q24H  .  fluconazole (DIFLUCAN) IV  200 mg Intravenous Q24H  . heparin subcutaneous  5,000 Units Subcutaneous 3 times per day  .  HYDROmorphone (DILAUDID) injection  1 mg Intravenous Once  . HYDROmorphone PCA 0.3 mg/mL   Intravenous 6 times per day  . insulin aspart  0-9 Units Subcutaneous 6 times per day  . metronidazole  500 mg Intravenous Q8H  . piperacillin-tazobactam (ZOSYN)  IV  3.375 g Intravenous Q8H   Infusions:  .  sodium bicarbonate infusion 1/4 NS 1000 mL 50 mL/hr at 12/07/14 1102   PRN: butamben-tetracaine-benzocaine, LORazepam, methocarbamol (ROBAXIN)  IV, ondansetron **OR** ondansetron (ZOFRAN) IV   Assessment: 44yo male w/ h/o Crohn's, most recent hospital discharge 3/30 after severe sepsis 2/2 intraabdominal abscess, now c/o sudden onset of RUQ pain, generalized weakness, and hematemesis, admitted to MICU after ex lap and total colectomy, to begin IV ABX for sepsis. Currently afebrile, WBC trending down (72.5>>42.6), SCr trending up (0.79>>1.09>>1.82>>2.16). Candida albicans growing in wound culture prompted switch from micafungin to fluconazole. SCr trending up (2.16>>2.73) and CrCl trending down (51 ml/min >> 31 ml/min). Pt remains on flagyl for cdiff, despite total colectomy, and zosyn for intra-abdominal process. Blood cultures remain ngtd. Watch trend in SCr.  Plan:  Decrease fluconazole to 200mg  IV every 24 hours due to decreased renal function (< 50 ml/min). Continue Zosyn IV 3.375 g every 8 hours.  Follow temperature, WBC, SCr, and clinical improvement.   Roxy Horseman, PharmD Candidate 12/07/2014,12:34 PM   I agree with the assessment and plan. Increase fluconazole to 400 mg IV q24h when renal fx improves    Hughes Better, PharmD, BCPS Clinical Pharmacist Pager: [phone] 12/07/2014 4:23 PM

## 2014-12-07 NOTE — Progress Notes (Signed)
[facility] Progress Note  3 Days Post-Op  Subjective: Pt feels good, pain controlled.  No N/V, tolerated clamping trials well.  Sat in chair yesterday.  Ostomy with flatus and soft brown BM's.  Tolerating dressing changes.  Still tachy.  Looks much better.  IS to 1250  Objective: Vital signs in last 24 hours: Temp:  [97.5 F (36.4 C)-98.5 F (36.9 C)] 98.2 F (36.8 C) (04/28 0400) Pulse Rate:  [102-128] 116 (04/28 0700) Resp:  [10-20] 13 (04/28 0735) BP: (105-141)/(63-95) 135/78 mmHg (04/28 0700) SpO2:  [93 %-100 %] 98 % (04/28 0735)    Intake/Output from previous day: 04/27 0701 - 04/28 0700 In: 5083.3 [P.O.:600; I.V.:3533.3; IV Piggyback:950] Out: 2398 [Urine:1035; Emesis/NG output:350; Drains:713; Stool:300] Intake/Output this shift:    PE: Gen: Alert, NAD, pleasant Card: tachy, reg rhythm, no M/G/R heard Pulm: CTA, no W/R/R, good effort, IS to 1250 Abd: Soft, mildly distended, appropriately tender over midline incision and prior drain site, +BS, no HSM, midline incision clean and beefy red, minimal sanguinous drainage, blake drain in place in LLQ (at new site) with serosanguinous with small amount of serous fluid, old drain site packed and clean.    Lab Results:   Recent Labs  12/06/14 0311 12/06/14 1830  WBC 42.6* 60.9*  HGB 7.4* 8.1*  HCT 23.0* 25.8*  PLT 222 280   BMET  Recent Labs  12/06/14 0311 12/06/14 1830  NA 133* 132*  K 3.4* 3.8  CL 107 107  CO2 19 16*  GLUCOSE 120* 123*  BUN 21 27*  CREATININE 2.16* 2.49*  CALCIUM 6.2* 6.8*   PT/INR  Recent Labs  12/04/14 1625  LABPROT 18.4*  INR 1.52*   CMP     Component Value Date/Time   NA 132* 12/06/2014 1830   K 3.8 12/06/2014 1830   CL 107 12/06/2014 1830   CO2 16* 12/06/2014 1830   GLUCOSE 123* 12/06/2014 1830   BUN 27* 12/06/2014 1830   CREATININE 2.49* 12/06/2014 1830   CALCIUM 6.8* 12/06/2014 1830   PROT 3.7* 12/07/2014 0445   ALBUMIN <1.0* 12/07/2014 0445   AST  16 12/07/2014 0445   ALT 11 12/07/2014 0445   ALKPHOS 78 12/07/2014 0445   BILITOT 0.6 12/07/2014 0445   GFRNONAA 30* 12/06/2014 1830   GFRAA 35* 12/06/2014 1830   Lipase  No results found for: LIPASE     Studies/Results: Dg Chest Port 1 View  12/06/2014   CLINICAL DATA:  Edema  EXAM: PORTABLE CHEST - 1 VIEW  COMPARISON:  12/04/2014  FINDINGS: Low lung volumes with vascular crowding. Possible mild interstitial edema with bibasilar atelectasis. No pneumothorax.  The heart is normal in size.  Right IJ venous catheter terminates at the cavoatrial junction.  Enteric tube traverses in the distal gastric antrum.  Lucency beneath the right hemidiaphragm could suggest free air related to recent surgery.  IMPRESSION: Low lung volumes with vascular crowding and possible mild interstitial edema.  Bibasilar atelectasis.  Support apparatus as above.   Electronically Signed   By: Julian Hy M.D.   On: 12/06/2014 09:45   Dg Abd Portable 1v  12/07/2014   CLINICAL DATA:  Nasogastric tube placement.  Initial encounter.  EXAM: PORTABLE ABDOMEN - 1 VIEW  COMPARISON:  Abdominal radiograph performed 12/04/2014  FINDINGS: The patient's enteric tube is noted ending overlying the body of the stomach.  The visualized bowel gas pattern is unremarkable. A bowel suture line is noted at the left lower quadrant. An ostomy is seen  at the right lower quadrant. Scattered air and stool filled loops of colon are seen; no abnormal dilatation of small bowel loops is seen to suggest small bowel obstruction. No free intra-abdominal air is identified, though evaluation for free air is limited on a single supine view.  The visualized osseous structures are within normal limits; the sacroiliac joints are unremarkable in appearance.  A drainage catheter is noted overlying the upper pelvis.  IMPRESSION: 1. Enteric tube noted ending overlying the body of the stomach. 2. Unremarkable bowel gas pattern; postoperative change at the lower  quadrants. No free intra-abdominal air seen.   Electronically Signed   By: Garald Balding M.D.   On: 12/07/2014 06:32    Anti-infectives: Anti-infectives    Start     Dose/Rate Route Frequency Ordered Stop   12/06/14 1300  fluconazole (DIFLUCAN) IVPB 400 mg     400 mg 100 mL/hr over 120 Minutes Intravenous Every 24 hours 12/06/14 1119     12/06/14 0000  micafungin (MYCAMINE) 100 mg in sodium chloride 0.9 % 100 mL IVPB  Status:  Discontinued     100 mg 100 mL/hr over 1 Hours Intravenous Daily 12/05/14 1108 12/05/14 1332   12/05/14 1500  micafungin (MYCAMINE) 100 mg in sodium chloride 0.9 % 100 mL IVPB  Status:  Discontinued     100 mg 100 mL/hr over 1 Hours Intravenous Daily 12/05/14 1332 12/06/14 1045   12/05/14 1200  metroNIDAZOLE (FLAGYL) IVPB 500 mg     500 mg 100 mL/hr over 60 Minutes Intravenous Every 8 hours 12/05/14 1108     12/05/14 1000  micafungin (MYCAMINE) 100 mg in sodium chloride 0.9 % 100 mL IVPB  Status:  Discontinued     100 mg 100 mL/hr over 1 Hours Intravenous Daily 12/04/14 2255 12/04/14 2343   12/05/14 0000  fluconazole (DIFLUCAN) IVPB 200 mg  Status:  Discontinued     200 mg 100 mL/hr over 60 Minutes Intravenous Every 24 hours 12/04/14 2259 12/05/14 1107   12/05/14 0000  vancomycin (VANCOCIN) IVPB 1000 mg/200 mL premix  Status:  Discontinued     1,000 mg 200 mL/hr over 60 Minutes Intravenous Every 8 hours 12/04/14 2356 12/06/14 0850   12/04/14 1515  piperacillin-tazobactam (ZOSYN) IVPB 3.375 g     3.375 g 12.5 mL/hr over 240 Minutes Intravenous Every 8 hours 12/04/14 1510     12/04/14 1415  vancomycin (VANCOCIN) 50 mg/mL oral solution 125 mg  Status:  Discontinued     125 mg Oral 4 times per day 12/04/14 1402 12/04/14 2346   12/04/14 1415  ciprofloxacin (CIPRO) IVPB 400 mg  Status:  Discontinued     400 mg 200 mL/hr over 60 Minutes Intravenous Every 12 hours 12/04/14 1402 12/04/14 1504       Assessment/Plan Perforated sigmoid colon, perforated cecum,  toxic megacolon, small bowel fistula POD #3 s/p Ex Lap, SBR with anastamosis, total abdominal colectomy with end ileostomy, blake drain placement to LLQ Vincent Black) -Clamping trials went well, minimal NG output, d/c NG tube and start clears -IVF, pain control, antiemetics -IV Antibiotics Vanc 4/25>>.4/27 Zosyn 4/25>> Flagyl 4/26>> Diflucan 4/25>>4/26>>>4/27>>> Micafungin 4/26>>4/27 -Watch for development of intra-abdominal abscess that may need to be drained, repeat CT next week -Midline wound needs to be changed BID, wound vac starting tomorrow -Mobilize and IS -SCD's and heparin -Ostomy putting out stool/flatus, but may develop ileus, slow to advance diet -Dilaudid PCA is controlling his pain much better than fentanyl, wean soon -Appreciate CCM/hospitalist care of the  patient Deconditioning -PT ordered AKI -Minimal UOP, Cr. Up to 2.49, keep foley for strict I&O's, monitor closely Severe Sepsis 2* to above PCM -hopefully can advance diet soon and avoid NPO/TPN Leukocytosis - 60.9    LOS: 3 days    DORT, Laird Runnion 12/07/2014, 7:44 AM Pager: [phone]

## 2014-12-07 NOTE — Progress Notes (Addendum)
Rehab Admissions Coordinator Note:  Patient was screened by Cleatrice Burke for appropriateness for an Inpatient Acute Rehab Consult per PT recommendation.  At this time, we are recommending await further progress with therapy before determining rehab venue options. [facility] typically does not approve and inpt hospital rehab admission for pt's diagnosis. Cleatrice Burke 12/07/2014, 1:28 PM  I can be reached at [phone] I will follow.

## 2014-12-08 DIAGNOSIS — Z980 Intestinal bypass and anastomosis status: Secondary | ICD-10-CM

## 2014-12-08 LAB — HEPATIC FUNCTION PANEL
ALT: 10 U/L (ref 0–53)
AST: 18 U/L (ref 0–37)
Albumin: <1 g/dL — ABNORMAL LOW (ref 3.5–5.2)
Alkaline Phosphatase: 92 U/L (ref 39–117)
BILIRUBIN TOTAL: 0.5 mg/dL (ref 0.3–1.2)
Bilirubin, Direct: 0.1 mg/dL (ref 0.0–0.5)
Indirect Bilirubin: 0.4 mg/dL (ref 0.3–0.9)
Total Protein: 3.8 g/dL — ABNORMAL LOW (ref 6.0–8.3)

## 2014-12-08 LAB — CBC WITH DIFFERENTIAL/PLATELET
BASOS ABS: 0 10*3/uL (ref 0.0–0.1)
Basophils Relative: 0 % (ref 0–1)
EOS PCT: 0 % (ref 0–5)
Eosinophils Absolute: 0 10*3/uL (ref 0.0–0.7)
HCT: 22.5 % — ABNORMAL LOW (ref 39.0–52.0)
HEMOGLOBIN: 7.2 g/dL — AB (ref 13.0–17.0)
LYMPHS ABS: 1.8 10*3/uL (ref 0.7–4.0)
Lymphocytes Relative: 5 % — ABNORMAL LOW (ref 12–46)
MCH: 27.3 pg (ref 26.0–34.0)
MCHC: 32 g/dL (ref 30.0–36.0)
MCV: 85.2 fL (ref 78.0–100.0)
Monocytes Absolute: 1.4 10*3/uL — ABNORMAL HIGH (ref 0.1–1.0)
Monocytes Relative: 4 % (ref 3–12)
NEUTROS PCT: 91 % — AB (ref 43–77)
Neutro Abs: 32.4 10*3/uL — ABNORMAL HIGH (ref 1.7–7.7)
PLATELETS: 276 10*3/uL (ref 150–400)
RBC: 2.64 MIL/uL — ABNORMAL LOW (ref 4.22–5.81)
RDW: 16.9 % — ABNORMAL HIGH (ref 11.5–15.5)
WBC: 35.6 10*3/uL — ABNORMAL HIGH (ref 4.0–10.5)

## 2014-12-08 LAB — GLUCOSE, CAPILLARY
Glucose-Capillary: 83 mg/dL (ref 70–99)
Glucose-Capillary: 72 mg/dL (ref 70–99)
Glucose-Capillary: 66 mg/dL — ABNORMAL LOW (ref 70–99)
Glucose-Capillary: 82 mg/dL (ref 70–99)
Glucose-Capillary: 66 mg/dL — ABNORMAL LOW (ref 70–99)
Glucose-Capillary: 79 mg/dL (ref 70–99)
Glucose-Capillary: 61 mg/dL — ABNORMAL LOW (ref 70–99)
Glucose-Capillary: 82 mg/dL (ref 70–99)

## 2014-12-08 MED ORDER — ACETAMINOPHEN 500 MG PO TABS
1000.0000 mg | ORAL_TABLET | Freq: Three times a day (TID) | ORAL | Status: DC
  Administered 2014-12-08 – 2014-12-11 (×10): 1000 mg via ORAL
  Filled 2014-12-08 (×12): qty 2

## 2014-12-08 MED ORDER — PREDNISOLONE ACETATE 1 % OP SUSP
1.0000 [drp] | Freq: Two times a day (BID) | OPHTHALMIC | Status: DC
  Administered 2014-12-08 – 2014-12-20 (×24): 1 [drp] via OPHTHALMIC
  Filled 2014-12-08: qty 1

## 2014-12-08 MED ORDER — LIP MEDEX EX OINT: 1.0000 "application " | TOPICAL_OINTMENT | Freq: Two times a day (BID) | CUTANEOUS | Status: DC

## 2014-12-08 MED ORDER — PHENOL 1.4 % MT LIQD: 2.0000 | OROMUCOSAL | Status: DC | PRN

## 2014-12-08 MED ORDER — PROMETHAZINE HCL 25 MG/ML IJ SOLN: 6.2500 mg | INTRAMUSCULAR | Status: DC | PRN

## 2014-12-08 MED ORDER — PRO-STAT SUGAR FREE PO LIQD
30.0000 mL | Freq: Two times a day (BID) | ORAL | Status: DC
  Administered 2014-12-09 – 2014-12-16 (×16): 30 mL via ORAL
  Filled 2014-12-08 (×19): qty 30

## 2014-12-08 MED ORDER — LORAZEPAM 2 MG/ML IJ SOLN: 0.5000 mg | Freq: Four times a day (QID) | INTRAMUSCULAR | Status: DC | PRN

## 2014-12-08 MED ORDER — METOPROLOL TARTRATE 12.5 MG HALF TABLET
12.5000 mg | ORAL_TABLET | Freq: Two times a day (BID) | ORAL | Status: DC
  Administered 2014-12-08 – 2014-12-20 (×25): 12.5 mg via ORAL
  Filled 2014-12-08 (×26): qty 1

## 2014-12-08 MED ORDER — SODIUM CHLORIDE 0.9 % IV SOLN
INTRAVENOUS | Status: DC
  Administered 2014-12-08: 20:00:00 via INTRAVENOUS

## 2014-12-08 MED ORDER — ALUM & MAG HYDROXIDE-SIMETH 200-200-20 MG/5ML PO SUSP
30.0000 mL | Freq: Four times a day (QID) | ORAL | Status: DC | PRN
  Administered 2014-12-17: 30 mL via ORAL
  Filled 2014-12-08: qty 30

## 2014-12-08 MED ORDER — DIPHENHYDRAMINE HCL 50 MG/ML IJ SOLN: 12.5000 mg | Freq: Four times a day (QID) | INTRAMUSCULAR | Status: DC | PRN

## 2014-12-08 MED ORDER — OXYCODONE HCL 5 MG PO TABS: 5.0000 mg | ORAL_TABLET | ORAL | Status: DC | PRN

## 2014-12-08 MED ORDER — FENTANYL CITRATE (PF) 100 MCG/2ML IJ SOLN
25.0000 ug | INTRAMUSCULAR | Status: DC | PRN
  Administered 2014-12-08: 50 ug via INTRAVENOUS
  Filled 2014-12-08: qty 2

## 2014-12-08 MED ORDER — MIRTAZAPINE 15 MG PO TBDP
15.0000 mg | ORAL_TABLET | Freq: Every day | ORAL | Status: DC
  Administered 2014-12-08 – 2014-12-19 (×11): 15 mg via ORAL
  Filled 2014-12-08 (×14): qty 1

## 2014-12-08 MED ORDER — MORPHINE SULFATE ER 15 MG PO TBCR
30.0000 mg | EXTENDED_RELEASE_TABLET | Freq: Two times a day (BID) | ORAL | Status: DC
  Administered 2014-12-08 – 2014-12-09 (×3): 30 mg via ORAL
  Filled 2014-12-08 (×3): qty 2

## 2014-12-08 MED ORDER — LACTATED RINGERS IV BOLUS (SEPSIS): 1000.0000 mL | Freq: Three times a day (TID) | INTRAVENOUS | Status: DC | PRN

## 2014-12-08 MED ORDER — ENSURE ENLIVE PO LIQD
237.0000 mL | Freq: Two times a day (BID) | ORAL | Status: DC
  Administered 2014-12-08 – 2014-12-20 (×17): 237 mL via ORAL

## 2014-12-08 MED ORDER — BLISTEX MEDICATED EX OINT
1.0000 "application " | TOPICAL_OINTMENT | Freq: Two times a day (BID) | CUTANEOUS | Status: DC
  Administered 2014-12-08 – 2014-12-20 (×20): 1 via TOPICAL
  Filled 2014-12-08: qty 10

## 2014-12-08 MED ORDER — MAGIC MOUTHWASH
15.0000 mL | Freq: Four times a day (QID) | ORAL | Status: DC | PRN
  Filled 2014-12-08: qty 15

## 2014-12-08 MED ORDER — FAMOTIDINE 20 MG PO TABS
20.0000 mg | ORAL_TABLET | Freq: Two times a day (BID) | ORAL | Status: DC
  Filled 2014-12-08: qty 1

## 2014-12-08 MED ORDER — PANTOPRAZOLE SODIUM 40 MG PO TBEC
40.0000 mg | DELAYED_RELEASE_TABLET | Freq: Every day | ORAL | Status: DC
  Administered 2014-12-08: 40 mg via ORAL
  Filled 2014-12-08: qty 1

## 2014-12-08 MED ORDER — METOPROLOL TARTRATE 12.5 MG HALF TABLET
12.5000 mg | ORAL_TABLET | Freq: Two times a day (BID) | ORAL | Status: DC | PRN
  Filled 2014-12-08: qty 1

## 2014-12-08 MED ORDER — FAMOTIDINE 20 MG PO TABS
20.0000 mg | ORAL_TABLET | Freq: Every day | ORAL | Status: DC
  Filled 2014-12-08: qty 1

## 2014-12-08 MED ORDER — SACCHAROMYCES BOULARDII 250 MG PO CAPS
250.0000 mg | ORAL_CAPSULE | Freq: Two times a day (BID) | ORAL | Status: DC
  Administered 2014-12-08 – 2014-12-20 (×25): 250 mg via ORAL
  Filled 2014-12-08 (×27): qty 1

## 2014-12-08 NOTE — Clinical Documentation Improvement (Signed)
Noted per MD progress note 12/07/14... "Pulmonary hygiene.- Pulmonary edema on CXR 4/27- CXR not overwhelmed for edema-Need to stand, ambulate-IS.".Marland KitchenMarland Kitchen  For accurate Dx specificity & severity of illness can noted " pulmonary edema" be further specified/ clarified with acuity. Thank you  Possible Conditions: Acute Pulmonary edema Chronic Pulmonary edema Other Not able to determine  Risk Factors: See above note Sign & Symptoms: See above note Diagnostics: See above note Treatment: See above note  Thank you, Ermelinda Das, RN, BSN, CCDS Certified Clinical Documentation Specialist Pager: [place]: Health Information Management

## 2014-12-08 NOTE — Progress Notes (Signed)
[place] TEAM 1 - Stepdown/ICU TEAM Progress Note  Vincent Black KGU:[id number] DOB: 28-Sep-1970 DOA: 12/04/2014 PCP: No PCP Per Patient  Admit HPI / Brief Narrative: 44 y.o. M with Crohn's and C.diff brought to [facility] 4/25 for worsening abdominal pain. Found to have perforated sigmoid colon, perforated cecal toxic megacolon, and small bowel fistula. He was taken to the OR for ex lap and small bowel resection with anastomosis, total abdominal colectomy with end ileostomy. Due to significant stool contamination, he was transferred to the ICU post op for close monitoring.  Significant Events: 4/25: Admitted for severe abd pain. Chest x-ray noted extensive free intraperitoneal air.  Underwent ex lap with SB resection and total abdominal colectomy with end ileostomy. 4/26: Poor uop despite increased IVF and 4L in boluses, shock pressors 4/27: Improved LA, with increased output urine. Off Neo  HPI/Subjective: Patient is resting comfortably in bed with no new complaints.  There is no respiratory distress or chest pain.  He has been tolerating his current diet without significant difficulty.  Assessment/Plan:  C diff toxic megacolon with perforation sigmoid colon w/ septic shock s/p total colectomy with end ileostomy, small bowel resection with anastomosis Ongoing care of wounds and ostomy and nutrition per general surgery - ongoing care of antibiotic dosing per infectious disease  Acute renal failure Renal function slowly improving - transition off bicarbonate tonight and follow  Sinus tachycardia Appears to be physiologic - follow  Hypokalemia Due to poor intake as well as IV bicarbonate - supplement as indicated and follow  Hyponatremia Likely due to a combination of hypovolemia as well as administration of hypotonic IV fluid - transition to isotonic saline and follow trend  Anemia - chronic and in setting of acute blood loss post op Transfuse as needed to keep hemoglobin greater  than 6.9  Thrombocytosis - chronic Stable  H/o Crohn's  Severe malnutrition in the context of acute illness Diet per general surgery  Code Status: FULL Family Communication: Spoke with wife at bedside Disposition Plan: SDU  Consultants: General surgery Pulmonary/critical care GI - Dr. Benson [place] Infectious Disease  Antibiotics: Zosyn 4/25 > Flagyl 4/26 > Fluconazole 4/27 >  DVT prophylaxis: Subcutaneous heparin  Objective: Blood pressure 129/82, pulse 111, temperature 98.3 F (36.8 C), temperature source Oral, resp. rate 15, height 5\' 9"  (1.753 m), weight 66.9 kg (147 lb 7.8 oz), SpO2 96 %.  Intake/Output Summary (Last 24 hours) at 12/08/14 1723 Last data filed at 12/08/14 6283  Gross per 24 hour  Intake    895 ml  Output   1270 ml  Net   -375 ml   Exam: General: No acute respiratory distress Lungs: Clear to auscultation bilaterally without wheezes or crackles Cardiovascular: Regular rate and rhythm without murmur gallop or rub normal S1 and S2 Abdomen: Nondistended, soft, no ascites, no appreciable mass Extremities: No significant cyanosis, clubbing, or edema bilateral lower extremities  Data Reviewed: Basic Metabolic Panel:  Recent Labs Lab 12/05/14 0415 12/05/14 1657 12/06/14 0311 12/06/14 1830 12/07/14 0746 12/07/14 1800  NA 133* 132* 133* 132* 134* 131*  K 3.5 3.6 3.4* 3.8 3.5 3.3*  CL 105 104 107 107 105 105  CO2 17* 19 19 16* 18* 19  GLUCOSE 191* 145* 120* 123* 82 86  BUN 9 16 21  27* 28* 29*  CREATININE 1.09 1.82* 2.16* 2.49* 2.73* 2.40*  CALCIUM 6.7* 6.3* 6.2* 6.8* 6.8* 6.7*  MG 1.1* 1.6 1.5 2.6* 2.3 2.3  PHOS 3.8 3.2  --  4.2 4.1 3.9  Liver Function Tests:  Recent Labs Lab 12/04/14 1625 12/06/14 0311 12/07/14 0445 12/08/14 0703  AST 26 18 16 18   ALT 8 9 11 10   ALKPHOS 111 154* 78 92  BILITOT 0.9 0.5 0.6 0.5  PROT 5.9* 3.6* 3.7* 3.8*  ALBUMIN 1.8* <1.0* <1.0* <1.0*    Coags:  Recent Labs Lab 12/04/14 1625  INR 1.52*     Recent Labs Lab 12/04/14 1625  APTT 35    CBC:  Recent Labs Lab 12/06/14 0311 12/06/14 1830 12/07/14 0746 12/07/14 1800 12/08/14 0800  WBC 42.6* 60.9* 48.7* 43.2* 35.6*  NEUTROABS 39.6* 57.9* 45.7* 38.9* 32.4*  HGB 7.4* 8.1* 7.1* 7.0* 7.2*  HCT 23.0* 25.8* 22.1* 21.7* 22.5*  MCV 86.1 87.5 85.7 86.5 85.2  PLT 222 280 248 245 276   CBG:  Recent Labs Lab 12/08/14 0102 12/08/14 0428 12/08/14 0749 12/08/14 0920 12/08/14 1256  GLUCAP 83 72 66* 82 66*    Recent Results (from the past 240 hour(s))  Culture, blood (x 2)     Status: None (Preliminary result)   Collection Time: 12/04/14  4:50 PM  Result Value Ref Range Status   Specimen Description BLOOD LEFT HAND  Final   Special Requests BOTTLES DRAWN AEROBIC ONLY 2CC  Final   Culture   Final           BLOOD CULTURE RECEIVED NO GROWTH TO DATE CULTURE WILL BE HELD FOR 5 DAYS BEFORE ISSUING A FINAL NEGATIVE REPORT Performed at [facility]    Report Status PENDING  Incomplete  Culture, blood (x 2)     Status: None (Preliminary result)   Collection Time: 12/04/14  4:50 PM  Result Value Ref Range Status   Specimen Description BLOOD RIGHT HAND  Final   Special Requests BOTTLES DRAWN AEROBIC ONLY 5CC  Final   Culture   Final           BLOOD CULTURE RECEIVED NO GROWTH TO DATE CULTURE WILL BE HELD FOR 5 DAYS BEFORE ISSUING A FINAL NEGATIVE REPORT Performed at [facility]    Report Status PENDING  Incomplete  MRSA PCR Screening     Status: None   Collection Time: 12/04/14  5:32 PM  Result Value Ref Range Status   MRSA by PCR NEGATIVE NEGATIVE Final    Comment:        The GeneXpert MRSA Assay (FDA approved for NASAL specimens only), is one component of a comprehensive MRSA colonization surveillance program. It is not intended to diagnose MRSA infection nor to guide or monitor treatment for MRSA infections.   Wound culture     Status: None   Collection Time: 12/04/14  7:10 PM  Result Value  Ref Range Status   Specimen Description ABDOMEN  Final   Special Requests PATIENT ON FOLLOWING ZINACEF  Final   Gram Stain   Final    FEW WBC PRESENT,BOTH PMN AND MONONUCLEAR NO SQUAMOUS EPITHELIAL CELLS SEEN NO ORGANISMS SEEN Performed at [facility]    Culture   Final    FEW CANDIDA ALBICANS Performed at [facility]    Report Status 12/07/2014 FINAL  Final  Anaerobic culture     Status: None (Preliminary result)   Collection Time: 12/04/14  7:10 PM  Result Value Ref Range Status   Specimen Description WOUND ABDOMEN  Final   Special Requests PT ON ZINACEF  Final   Gram Stain   Final    FEW WBC PRESENT,BOTH PMN AND MONONUCLEAR NO SQUAMOUS  EPITHELIAL CELLS SEEN NO ORGANISMS SEEN Performed at [facility]    Culture   Final    NO ANAEROBES ISOLATED; CULTURE IN PROGRESS FOR 5 DAYS Performed at [facility]    Report Status PENDING  Incomplete     Studies:   Recent x-ray studies have been reviewed in detail by the Attending Physician  Scheduled Meds:  Scheduled Meds: . acetaminophen  1,000 mg Oral TID  . antiseptic oral rinse  7 mL Mouth Rinse q12n4p  . chlorhexidine  15 mL Mouth Rinse BID  . feeding supplement (ENSURE ENLIVE)  237 mL Oral BID BM  . feeding supplement (PRO-STAT SUGAR FREE 64)  30 mL Oral BID WC  . fluconazole (DIFLUCAN) IV  200 mg Intravenous Q24H  . heparin subcutaneous  5,000 Units Subcutaneous 3 times per day  . insulin aspart  0-9 Units Subcutaneous 6 times per day  . lip balm  1 application Topical BID  . metoprolol tartrate  12.5 mg Oral BID  . mirtazapine  15 mg Oral QHS  . morphine  30 mg Oral Q12H  . pantoprazole  40 mg Oral Daily  . piperacillin-tazobactam (ZOSYN)  IV  3.375 g Intravenous Q8H  . prednisoLONE acetate  1 drop Both Eyes BID  . saccharomyces boulardii  250 mg Oral BID    Time spent on care of this patient: 35 mins  Jurni Cesaro T , MD   [facility] Office  [phone] Pager  - Text Page per Shea Evans as per below:  On-Call/Text Page:      [email or web address]      password [email or web address]  If 7PM-7AM, please contact night-coverage [email or web address] Password [email or web address] 12/08/2014, 5:23 PM   LOS: 4 days

## 2014-12-08 NOTE — Consult Note (Addendum)
WOC wound consult note Reason for Consult: Consult requested to apply Vac dressing to abd post-op wound.  CCS following for assessment and plan of care. Wound type: Full thickness midline abd wound Measurement: 21X4X.4cm Wound bed: Beefy red Drainage (amount, consistency, odor) Scant amt yellow drainage, no odor. Periwound: Intact skin surrounding. Dressing procedure/placement/frequency: Applied one piece black foam to midline abd and 126mm cont suction.  Pt tolerated with mod amt discomfort.  Plan for bedside nurses to change on M/W/F.   WOC ostomy follow up Stoma type/location: Ileostoy intact to RLQ.  Stomal assessment/size: 1 1/4 inches, even with skin level. Red and moist.  Peristomal assessment: Intact Output: Moderate amount of liquid brown stool.  Ostomy pouching: 2pc.with barrier ring to maintain seal. Education provided: Demonstrated pouch change to patient and wife at bedside. Wife was able to apply with minimal assistance and was able to open and close Velcro to empty pouch. Supplies are ordered at the bedside for staff nurses. Will continue education sessions. Julien Girt MSN, RN, [place], [place], [place]

## 2014-12-08 NOTE — Progress Notes (Signed)
Subjective: Feeling well.  No complaints.  Objective: Vital signs in last 24 hours: Temp:  [97.4 F (36.3 C)-98.7 F (37.1 C)] 98.5 F (36.9 C) (04/29 0746) Pulse Rate:  [102-118] 118 (04/29 0746) Resp:  [8-19] 15 (04/29 0746) BP: (131-145)/(81-106) 143/89 mmHg (04/29 0700) SpO2:  [95 %-100 %] 96 % (04/29 0746) Last BM Date:  (PTA)  Intake/Output from previous day: 04/28 0701 - 04/29 0700 In: 1687.5 [I.V.:1100; IV Piggyback:587.5] Out: 2580 [Urine:1650; Emesis/NG output:100; Drains:680; Stool:150] Intake/Output this shift: Total I/O In: 120 [P.O.:120] Out: -   General appearance: alert, fatigued and no distress GI: tender at the incision sites  Lab Results:  Recent Labs  12/06/14 1830 12/07/14 0746 12/07/14 1800  WBC 60.9* 48.7* 43.2*  HGB 8.1* 7.1* 7.0*  HCT 25.8* 22.1* 21.7*  PLT 280 248 245   BMET  Recent Labs  12/06/14 1830 12/07/14 0746 12/07/14 1800  NA 132* 134* 131*  K 3.8 3.5 3.3*  CL 107 105 105  CO2 16* 18* 19  GLUCOSE 123* 82 86  BUN 27* 28* 29*  CREATININE 2.49* 2.73* 2.40*  CALCIUM 6.8* 6.8* 6.7*   LFT  Recent Labs  12/08/14 0703  PROT 3.8*  ALBUMIN <1.0*  AST 18  ALT 10  ALKPHOS 92  BILITOT 0.5  BILIDIR 0.1  IBILI 0.4   PT/INR No results for input(s): LABPROT, INR in the last 72 hours. Hepatitis Panel No results for input(s): HEPBSAG, HCVAB, HEPAIGM, HEPBIGM in the last 72 hours. C-Diff No results for input(s): CDIFFTOX in the last 72 hours. Fecal Lactopherrin No results for input(s): FECLLACTOFRN in the last 72 hours.  Studies/Results: Dg Chest Port 1 View  12/07/2014   CLINICAL DATA:  Pulmonary edema.  EXAM: PORTABLE CHEST - 1 VIEW  COMPARISON:  12/06/2014  FINDINGS: Low lung volumes with bibasilar atelectasis and vascular congestion, similar to prior study. Lucency under the right hemidiaphragm again noted, possibly pneumoperitoneum. NG tube and right central line remain in place, unchanged.  IMPRESSION: Low lung  volumes with bibasilar atelectasis.  Lucency under the right hemidiaphragm again noted, possibly pneumoperitoneum related to recent surgery. May consider decubitus view of the abdomen to assess degree of pneumoperitoneum if felt clinically indicated.   Electronically Signed   By: Rolm Baptise M.D.   On: 12/07/2014 08:50   Dg Chest Port 1 View  12/06/2014   CLINICAL DATA:  Edema  EXAM: PORTABLE CHEST - 1 VIEW  COMPARISON:  12/04/2014  FINDINGS: Low lung volumes with vascular crowding. Possible mild interstitial edema with bibasilar atelectasis. No pneumothorax.  The heart is normal in size.  Right IJ venous catheter terminates at the cavoatrial junction.  Enteric tube traverses in the distal gastric antrum.  Lucency beneath the right hemidiaphragm could suggest free air related to recent surgery.  IMPRESSION: Low lung volumes with vascular crowding and possible mild interstitial edema.  Bibasilar atelectasis.  Support apparatus as above.   Electronically Signed   By: Julian Hy M.D.   On: 12/06/2014 09:45   Dg Abd Portable 1v  12/07/2014   CLINICAL DATA:  Nasogastric tube placement.  Initial encounter.  EXAM: PORTABLE ABDOMEN - 1 VIEW  COMPARISON:  Abdominal radiograph performed 12/04/2014  FINDINGS: The patient's enteric tube is noted ending overlying the body of the stomach.  The visualized bowel gas pattern is unremarkable. A bowel suture line is noted at the left lower quadrant. An ostomy is seen at the right lower quadrant. Scattered air and stool filled loops of colon  are seen; no abnormal dilatation of small bowel loops is seen to suggest small bowel obstruction. No free intra-abdominal air is identified, though evaluation for free air is limited on a single supine view.  The visualized osseous structures are within normal limits; the sacroiliac joints are unremarkable in appearance.  A drainage catheter is noted overlying the upper pelvis.  IMPRESSION: 1. Enteric tube noted ending overlying the  body of the stomach. 2. Unremarkable bowel gas pattern; postoperative change at the lower quadrants. No free intra-abdominal air seen.   Electronically Signed   By: Garald Balding M.D.   On: 12/07/2014 06:32    Medications:  Scheduled: . acetaminophen  1,000 mg Oral TID  . antiseptic oral rinse  7 mL Mouth Rinse q12n4p  . chlorhexidine  15 mL Mouth Rinse BID  . fluconazole (DIFLUCAN) IV  200 mg Intravenous Q24H  . heparin subcutaneous  5,000 Units Subcutaneous 3 times per day  . insulin aspart  0-9 Units Subcutaneous 6 times per day  . lip balm  1 application Topical BID  . metoprolol tartrate  12.5 mg Oral BID  . mirtazapine  15 mg Oral QHS  . morphine  30 mg Oral Q12H  . pantoprazole  40 mg Oral Daily  . piperacillin-tazobactam (ZOSYN)  IV  3.375 g Intravenous Q8H  . prednisoLONE acetate  1 drop Both Eyes BID  . saccharomyces boulardii  250 mg Oral BID   Continuous: .  sodium bicarbonate infusion 1/4 NS 1000 mL 50 mL/hr at 12/07/14 1102    Assessment/Plan: 1) S/p colectomy for Toxic Megacolon. 2) Renal insufficiency.   He continues to make progress.  His creatinine yesterday upon follow up with another blood draw revealed that it was at 2.4.  He is producing urine.  Plan: 1) Continue with supportive care.   LOS: 4 days   Jaydee Conran D 12/08/2014, 8:26 AM

## 2014-12-08 NOTE — Progress Notes (Addendum)
NUTRITION FOLLOW UP  DOCUMENTATION CODES Per approved criteria  -Severe malnutrition in the context of acute illness or injury   INTERVENTION: Ensure Enlive po BID, each supplement provides 350 kcal and 20 grams of protein Prostat liquid protein po 30 ml BID with meals, each supplement provides 100 kcal, 15 grams protein RD to follow for nutrition care plan  NUTRITION DIAGNOSIS: Malnutrition related to acute illness as evidenced by weight loss and severe muscle mass loss, ongoing  Goal: Pt to meet >/= 90% of their estimated nutrition needs, progressing   Monitor:  PO & supplemental intake, weight, labs, I/O's  ASSESSMENT: Pt with history of Crohn's disease, several recent admissions in March 2016 after being treated severe C. difficile colitis complicated by pericolonic abscess requiring drain placement. CT abdomen was done on 11/17/2014 and showed abscess cavity collapsed. Pt presented with hematemesis, generalized weakness, right upper quadrant area pain.   Procedure (4/25):  1. Ex lap 2. Small bowel resection with anastomosis 3. Total abdominal colectomy with end ileostomy  CWOCN note reviewed 4/29.  Pt with post-op abdominal wound VAC.  Currently on a Full Liquid diet.  No % PO intake records available.  Nutrient needs increased given malnutrition & wound healing.  Would benefit from addition of oral nutrition supplements/liquid protein.  RD to order.  Height: Ht Readings from Last 1 Encounters:  12/04/14 5\' 9"  (1.753 m)    Weight: Wt Readings from Last 1 Encounters:  12/04/14 147 lb 7.8 oz (66.9 kg)    BMI:  Body mass index is 21.77 kg/(m^2).  Estimated Nutritional Needs: Kcal: 1900-2100 Protein: 90-110 grams Fluid: 1.9 - 2.1 L/day  Skin: Incision on abdomen  Diet Order: Diet full liquid Room service appropriate?: Yes; Fluid consistency:: Thin   Intake/Output Summary (Last 24 hours) at 12/08/14 1312 Last data filed at 12/08/14 0752  Gross per 24 hour   Intake 1207.5 ml  Output   1750 ml  Net -542.5 ml    Labs:   Recent Labs Lab 12/06/14 1830 12/07/14 0746 12/07/14 1800  NA 132* 134* 131*  K 3.8 3.5 3.3*  CL 107 105 105  CO2 16* 18* 19  BUN 27* 28* 29*  CREATININE 2.49* 2.73* 2.40*  CALCIUM 6.8* 6.8* 6.7*  MG 2.6* 2.3 2.3  PHOS 4.2 4.1 3.9  GLUCOSE 123* 82 86    CBG (last 3)   Recent Labs  12/08/14 0749 12/08/14 0920 12/08/14 1256  GLUCAP 66* 82 66*    Scheduled Meds: . acetaminophen  1,000 mg Oral TID  . antiseptic oral rinse  7 mL Mouth Rinse q12n4p  . chlorhexidine  15 mL Mouth Rinse BID  . fluconazole (DIFLUCAN) IV  200 mg Intravenous Q24H  . heparin subcutaneous  5,000 Units Subcutaneous 3 times per day  . insulin aspart  0-9 Units Subcutaneous 6 times per day  . lip balm  1 application Topical BID  . metoprolol tartrate  12.5 mg Oral BID  . mirtazapine  15 mg Oral QHS  . morphine  30 mg Oral Q12H  . pantoprazole  40 mg Oral Daily  . piperacillin-tazobactam (ZOSYN)  IV  3.375 g Intravenous Q8H  . prednisoLONE acetate  1 drop Both Eyes BID  . saccharomyces boulardii  250 mg Oral BID    Continuous Infusions: .  sodium bicarbonate infusion 1/4 NS 1000 mL 50 mL/hr at 12/07/14 1102    Past Medical History  Diagnosis Date  . Crohn disease   . C. difficile colitis 09/30/2014  .  Ankylosing spondylitis 09/30/2014  . Anxiety     Past Surgical History  Procedure Laterality Date  . Flexible sigmoidoscopy N/A 09/29/2014    Procedure: FLEXIBLE SIGMOIDOSCOPY;  Surgeon: Beryle Beams, MD;  Location: [place];  Service: Endoscopy;  Laterality: N/A;  . Laparotomy N/A 12/04/2014    Procedure: EXPLORATORY LAPAROTOMY, Total Colectomy;  Surgeon: Rolm Bookbinder, MD;  Location: [place];  Service: General;  Laterality: N/A;    Arthur Holms, RD, LDN Pager #: [phone] After-Hours Pager #: [phone]

## 2014-12-08 NOTE — Progress Notes (Addendum)
[place] for Infectious Disease  Date of Admission:  12/04/2014  Antibiotics: Zosyn  flagyl  Subjective: Sleeping, drain still with significant drainage  Objective: Temp:  [97.6 F (36.4 C)-98.7 F (37.1 C)] 98.5 F (36.9 C) (04/29 0746) Pulse Rate:  [102-123] 123 (04/29 0901) Resp:  [8-18] 15 (04/29 0746) BP: (131-145)/(81-106) 139/95 mmHg (04/29 0901) SpO2:  [95 %-100 %] 96 % (04/29 0746)  General: sleeping Skin: no rashes Lungs: CTA B   Lab Results Lab Results  Component Value Date   WBC 35.6* 12/08/2014   HGB 7.2* 12/08/2014   HCT 22.5* 12/08/2014   MCV 85.2 12/08/2014   PLT 276 12/08/2014    Lab Results  Component Value Date   CREATININE 2.40* 12/07/2014   BUN 29* 12/07/2014   NA 131* 12/07/2014   K 3.3* 12/07/2014   CL 105 12/07/2014   CO2 19 12/07/2014    Lab Results  Component Value Date   ALT 10 12/08/2014   AST 18 12/08/2014   ALKPHOS 92 12/08/2014   BILITOT 0.5 12/08/2014      Microbiology: Recent Results (from the past 240 hour(s))  Culture, blood (x 2)     Status: None (Preliminary result)   Collection Time: 12/04/14  4:50 PM  Result Value Ref Range Status   Specimen Description BLOOD LEFT HAND  Final   Special Requests BOTTLES DRAWN AEROBIC ONLY 2CC  Final   Culture   Final           BLOOD CULTURE RECEIVED NO GROWTH TO DATE CULTURE WILL BE HELD FOR 5 DAYS BEFORE ISSUING A FINAL NEGATIVE REPORT Performed at [facility]    Report Status PENDING  Incomplete  Culture, blood (x 2)     Status: None (Preliminary result)   Collection Time: 12/04/14  4:50 PM  Result Value Ref Range Status   Specimen Description BLOOD RIGHT HAND  Final   Special Requests BOTTLES DRAWN AEROBIC ONLY 5CC  Final   Culture   Final           BLOOD CULTURE RECEIVED NO GROWTH TO DATE CULTURE WILL BE HELD FOR 5 DAYS BEFORE ISSUING A FINAL NEGATIVE REPORT Performed at [facility]    Report Status PENDING  Incomplete  MRSA PCR Screening      Status: None   Collection Time: 12/04/14  5:32 PM  Result Value Ref Range Status   MRSA by PCR NEGATIVE NEGATIVE Final    Comment:        The GeneXpert MRSA Assay (FDA approved for NASAL specimens only), is one component of a comprehensive MRSA colonization surveillance program. It is not intended to diagnose MRSA infection nor to guide or monitor treatment for MRSA infections.   Wound culture     Status: None   Collection Time: 12/04/14  7:10 PM  Result Value Ref Range Status   Specimen Description ABDOMEN  Final   Special Requests PATIENT ON FOLLOWING ZINACEF  Final   Gram Stain   Final    FEW WBC PRESENT,BOTH PMN AND MONONUCLEAR NO SQUAMOUS EPITHELIAL CELLS SEEN NO ORGANISMS SEEN Performed at [facility]    Culture   Final    FEW CANDIDA ALBICANS Performed at [facility]    Report Status 12/07/2014 FINAL  Final  Anaerobic culture     Status: None (Preliminary result)   Collection Time: 12/04/14  7:10 PM  Result Value Ref Range Status   Specimen Description WOUND ABDOMEN  Final  Special Requests PT ON ZINACEF  Final   Gram Stain   Final    FEW WBC PRESENT,BOTH PMN AND MONONUCLEAR NO SQUAMOUS EPITHELIAL CELLS SEEN NO ORGANISMS SEEN Performed at [facility]    Culture   Final    NO ANAEROBES ISOLATED; CULTURE IN PROGRESS FOR 5 DAYS Performed at [facility]    Report Status PENDING  Incomplete    Studies/Results: Dg Chest Port 1 View  12/07/2014   CLINICAL DATA:  Pulmonary edema.  EXAM: PORTABLE CHEST - 1 VIEW  COMPARISON:  12/06/2014  FINDINGS: Low lung volumes with bibasilar atelectasis and vascular congestion, similar to prior study. Lucency under the right hemidiaphragm again noted, possibly pneumoperitoneum. NG tube and right central line remain in place, unchanged.  IMPRESSION: Low lung volumes with bibasilar atelectasis.  Lucency under the right hemidiaphragm again noted, possibly pneumoperitoneum related to recent  surgery. May consider decubitus view of the abdomen to assess degree of pneumoperitoneum if felt clinically indicated.   Electronically Signed   By: Rolm Baptise M.D.   On: 12/07/2014 08:50   Dg Abd Portable 1v  12/07/2014   CLINICAL DATA:  Nasogastric tube placement.  Initial encounter.  EXAM: PORTABLE ABDOMEN - 1 VIEW  COMPARISON:  Abdominal radiograph performed 12/04/2014  FINDINGS: The patient's enteric tube is noted ending overlying the body of the stomach.  The visualized bowel gas pattern is unremarkable. A bowel suture line is noted at the left lower quadrant. An ostomy is seen at the right lower quadrant. Scattered air and stool filled loops of colon are seen; no abnormal dilatation of small bowel loops is seen to suggest small bowel obstruction. No free intra-abdominal air is identified, though evaluation for free air is limited on a single supine view.  The visualized osseous structures are within normal limits; the sacroiliac joints are unremarkable in appearance.  A drainage catheter is noted overlying the upper pelvis.  IMPRESSION: 1. Enteric tube noted ending overlying the body of the stomach. 2. Unremarkable bowel gas pattern; postoperative change at the lower quadrants. No free intra-abdominal air seen.   Electronically Signed   By: Garald Balding M.D.   On: 12/07/2014 06:32    Assessment/Plan:  1) shock s/p total colectomy with end ileostomy, small bowel resection with anastomosis, due to C diff toxic megacolon with perforation - on empiric antibiotics with zosyn and fluconazole.  Has grown C albicans from surgery 4/25.  JP rain in place and still with drainage.  -continue with zosyn/fluconazole until drainage resolved  2) C diff - now with bowel resection.  May have some residual in small bowel.  Continue with flagyl IV or oral vancomycin.    Dr. Baxter Flattery is available over the weekend if needed, otherwise Dr. Johnnye Sima will see [place] A[place], [place] [facility] Infectious  Disease [place] [email or web address] [phone] pager   [phone] cell 12/08/2014, 10:46 AM

## 2014-12-08 NOTE — Progress Notes (Signed)
[facility]  [address] [place], [place] [address] Phone: [phone] FAX: [phone]    Vincent Black [id number] 11-Feb-1971  CARE TEAM:  PCP: No PCP Per Patient  Outpatient Care Team: Patient Care Team: No Pcp Per Patient as PCP - General (General Practice)  Inpatient Treatment Team: Treatment Team: Attending Provider: Cherene Altes, MD; Attending Physician: Theodis Blaze, MD; Consulting Physician: Nolon Nations, MD; Rounding Team: Md Pccm, MD; Registered Nurse: Wilhemena Durie, RN; Consulting Physician: Carol Ada, MD; Respiratory Therapist: Delaney Meigs, RRT; Registered Nurse: Iline Oven, RN; Rounding Team: Sherald Barge, MD; Respiratory Therapist: Nelly Laurence, RRT  Problem List:   Principal Problem:   Abdominal pain Active Problems:   Crohn's disease   History of Clostridium difficile colitis   Protein-calorie malnutrition, severe   Unintentional weight loss   Enteritis due to Clostridium difficile   Encounter for nasogastric (NG) tube placement   Fever   Perforation of sigmoid colon   C. difficile colitis   Septic shock   Toxic megacolon   Small bowel fistula   Sinus tachycardia   Hypomagnesemia   Metabolic acidosis   Anemia of chronic disease   Hyperglycemia   Leukocytosis   Chronic pain syndrome   Anxiety state   4 Days Post-Op   Postoperative diagnosis: perforated sigmoid colon, perforated cecum toxic megacolon, small bowel fistula Procedure:  1. Ex lap 2. Small bowel resection with anastomosis 3. Total abdominal colectomy with end ileostomy Surgeon: Dr Serita Grammes Asst: Dr Stark Klein EBL: 50 cc  Assessment  Sore / Confused  Plan:   -adv to full liquids but go slowly -try POmeds gradually -different IV narcotic -continue antibiotics - Zosyn / fluconazole.  WBC high but falling -ARF falling Cr w decent UOP - follow for now -inc HR from young age & surgery.   Add low dose  metoprolol w HTN - follow -low Hgb but stable - follow  -VTE prophylaxis- SCDs, etc -mobilize as tolerated to help recovery  Adin Hector, M.D., F.A.C.S. Gastrointestinal and Minimally Invasive Surgery [facility] [address] [place], [place] [address] [phone] Main / Paging   12/08/2014  Subjective:  Confused about surgery/location Hurting Tol clears  Objective:  Vital signs:  Filed Vitals:   12/08/14 0400 12/08/14 0552 12/08/14 0700 12/08/14 0746  BP: 142/106 134/91 143/89   Pulse: 111  118 118  Temp: 98.1 F (36.7 C)   98.5 F (36.9 C)  TempSrc: Axillary   Oral  Resp: _0 Height:      Weight:      SpO2: 98%  96% 96%    Last BM Date:  (PTA)  Intake/Output   Yesterday:  04/28 0701 - 04/29 0700 In: 1687.5 [I.V.:1100; IV Piggyback:587.5] Out: 2580 [Urine:1650; Emesis/NG output:100; Drains:680; Stool:150] This shift:  Total I/O In: 120 [P.O.:120] Out: -   Bowel function:  Flatus: yes  BM: thick green  Drain: serous  Physical Exam:  General: Pt awake/alert/oriented x2 in mild acute distress Eyes: PERRL, normal EOM.  Sclera clear.  No icterus Neuro: CN II-XII intact w/o focal sensory/motor deficits. Lymph: No head/neck/groin lymphadenopathy Psych:  No psychosis/paranoia.  Mildly confused occasionally then oriented HENT: Normocephalic, Mucus membranes moist.  No thrush Neck: Supple, No tracheal deviation Chest: No chest wall pain w OK excursion CV:  Pulses intact.  Regular rhythm MS: Normal AROM mjr joints.  No obvious deformity Abdomen:  Soft.  Nondistended.  Midline wound clean.  Ileostomy pink w gas/stool.   Moderately tender at incision only.  No evidence of peritonitis.  No incarcerated hernias. Ext:  SCDs BLE.  1+ edema.  No cyanosis Skin: No petechiae / purpura  Results:   Labs: Results for orders placed or performed during the hospital encounter of 12/04/14 (from the past 48 hour(s))   Glucose, capillary     Status: None   Collection Time: 12/06/14  7:57 AM  Result Value Ref Range   Glucose-Capillary 85 70 - 99 mg/dL  Glucose, capillary     Status: Abnormal   Collection Time: 12/06/14 11:38 AM  Result Value Ref Range   Glucose-Capillary 100 (H) 70 - 99 mg/dL  Glucose, capillary     Status: None   Collection Time: 12/06/14  4:17 PM  Result Value Ref Range   Glucose-Capillary 85 70 - 99 mg/dL  Basic metabolic panel     Status: Abnormal   Collection Time: 12/06/14  6:30 PM  Result Value Ref Range   Sodium 132 (L) 135 - 145 mmol/L   Potassium 3.8 3.5 - 5.1 mmol/L   Chloride 107 96 - 112 mmol/L   CO2 16 (L) 19 - 32 mmol/L   Glucose, Bld 123 (H) 70 - 99 mg/dL   BUN 27 (H) 6 - 23 mg/dL   Creatinine, Ser 2.49 (H) 0.50 - 1.35 mg/dL   Calcium 6.8 (L) 8.4 - 10.5 mg/dL   GFR calc non Af Amer 30 (L) >90 mL/min   GFR calc Af Amer 35 (L) >90 mL/min    Comment: (NOTE) The eGFR has been calculated using the CKD EPI equation. This calculation has not been validated in all clinical situations. eGFR's persistently <90 mL/min signify possible Chronic Kidney Disease.    Anion gap 9 5 - 15  Magnesium     Status: Abnormal   Collection Time: 12/06/14  6:30 PM  Result Value Ref Range   Magnesium 2.6 (H) 1.5 - 2.5 mg/dL  Phosphorus     Status: None   Collection Time: 12/06/14  6:30 PM  Result Value Ref Range   Phosphorus 4.2 2.3 - 4.6 mg/dL  CBC with Differential/Platelet     Status: Abnormal   Collection Time: 12/06/14  6:30 PM  Result Value Ref Range   WBC 60.9 (HH) 4.0 - 10.5 K/uL    Comment: REPEATED TO VERIFY CRITICAL VALUE NOTED.  VALUE IS CONSISTENT WITH PREVIOUSLY REPORTED AND CALLED VALUE. CRITICAL RESULT CALLED TO, READ BACK BY AND VERIFIED WITH: L CAUDLE,RN 1930 12/06/14 D BRADLEY    RBC 2.95 (L) 4.22 - 5.81 MIL/uL   Hemoglobin 8.1 (L) 13.0 - 17.0 g/dL   HCT 25.8 (L) 39.0 - 52.0 %   MCV 87.5 78.0 - 100.0 fL   MCH 27.5 26.0 - 34.0 pg   MCHC 31.4 30.0 - 36.0  g/dL   RDW 16.8 (H) 11.5 - 15.5 %   Platelets 280 150 - 400 K/uL   Neutrophils Relative % 95 (H) 43 - 77 %   Lymphocytes Relative 2 (L) 12 - 46 %   Monocytes Relative 3 3 - 12 %   Eosinophils Relative 0 0 - 5 %   Basophils Relative 0 0 - 1 %   Neutro Abs 57.9 (H) 1.7 - 7.7 K/uL   Lymphs Abs 1.2 0.7 - 4.0 K/uL   Monocytes Absolute 1.8 (H) 0.1 - 1.0 K/uL   Eosinophils Absolute 0.0 0.0 - 0.7 K/uL  Basophils Absolute 0.0 0.0 - 0.1 K/uL   RBC Morphology POLYCHROMASIA PRESENT     Comment: BURR CELLS   WBC Morphology MILD LEFT SHIFT (1-5% METAS, OCC MYELO, OCC BANDS)     Comment: TOXIC GRANULATION  Glucose, capillary     Status: Abnormal   Collection Time: 12/06/14  8:20 PM  Result Value Ref Range   Glucose-Capillary 102 (H) 70 - 99 mg/dL  Glucose, capillary     Status: None   Collection Time: 12/06/14 11:51 PM  Result Value Ref Range   Glucose-Capillary 93 70 - 99 mg/dL  Hepatic function panel     Status: Abnormal   Collection Time: 12/07/14  4:45 AM  Result Value Ref Range   Total Protein 3.7 (L) 6.0 - 8.3 g/dL   Albumin <1.0 (L) 3.5 - 5.2 g/dL   AST 16 0 - 37 U/L   ALT 11 0 - 53 U/L   Alkaline Phosphatase 78 39 - 117 U/L   Total Bilirubin 0.6 0.3 - 1.2 mg/dL   Bilirubin, Direct 0.1 0.0 - 0.5 mg/dL   Indirect Bilirubin 0.5 0.3 - 0.9 mg/dL  Glucose, capillary     Status: None   Collection Time: 12/07/14  4:51 AM  Result Value Ref Range   Glucose-Capillary 84 70 - 99 mg/dL  Basic metabolic panel     Status: Abnormal   Collection Time: 12/07/14  7:46 AM  Result Value Ref Range   Sodium 134 (L) 135 - 145 mmol/L   Potassium 3.5 3.5 - 5.1 mmol/L   Chloride 105 96 - 112 mmol/L   CO2 18 (L) 19 - 32 mmol/L   Glucose, Bld 82 70 - 99 mg/dL   BUN 28 (H) 6 - 23 mg/dL   Creatinine, Ser 2.73 (H) 0.50 - 1.35 mg/dL   Calcium 6.8 (L) 8.4 - 10.5 mg/dL   GFR calc non Af Amer 27 (L) >90 mL/min   GFR calc Af Amer 31 (L) >90 mL/min    Comment: (NOTE) The eGFR has been calculated using the  CKD EPI equation. This calculation has not been validated in all clinical situations. eGFR's persistently <90 mL/min signify possible Chronic Kidney Disease.    Anion gap 11 5 - 15  Magnesium     Status: None   Collection Time: 12/07/14  7:46 AM  Result Value Ref Range   Magnesium 2.3 1.5 - 2.5 mg/dL  Phosphorus     Status: None   Collection Time: 12/07/14  7:46 AM  Result Value Ref Range   Phosphorus 4.1 2.3 - 4.6 mg/dL  CBC with Differential/Platelet     Status: Abnormal   Collection Time: 12/07/14  7:46 AM  Result Value Ref Range   WBC 48.7 (H) 4.0 - 10.5 K/uL   RBC 2.58 (L) 4.22 - 5.81 MIL/uL   Hemoglobin 7.1 (L) 13.0 - 17.0 g/dL   HCT 22.1 (L) 39.0 - 52.0 %   MCV 85.7 78.0 - 100.0 fL   MCH 27.5 26.0 - 34.0 pg   MCHC 32.1 30.0 - 36.0 g/dL   RDW 16.8 (H) 11.5 - 15.5 %   Platelets 248 150 - 400 K/uL   Neutrophils Relative % 94 (H) 43 - 77 %   Lymphocytes Relative 3 (L) 12 - 46 %   Monocytes Relative 3 3 - 12 %   Eosinophils Relative 0 0 - 5 %   Basophils Relative 0 0 - 1 %   Neutro Abs 45.7 (H) 1.7 - 7.7 K/uL  Lymphs Abs 1.5 0.7 - 4.0 K/uL   Monocytes Absolute 1.5 (H) 0.1 - 1.0 K/uL   Eosinophils Absolute 0.0 0.0 - 0.7 K/uL   Basophils Absolute 0.0 0.0 - 0.1 K/uL   WBC Morphology MILD LEFT SHIFT (1-5% METAS, OCC MYELO, OCC BANDS)     Comment: TOXIC GRANULATION  Glucose, capillary     Status: None   Collection Time: 12/07/14  8:04 AM  Result Value Ref Range   Glucose-Capillary 76 70 - 99 mg/dL  Glucose, capillary     Status: None   Collection Time: 12/07/14 11:35 AM  Result Value Ref Range   Glucose-Capillary 84 70 - 99 mg/dL  Glucose, capillary     Status: None   Collection Time: 12/07/14  3:59 PM  Result Value Ref Range   Glucose-Capillary 80 70 - 99 mg/dL  Basic metabolic panel     Status: Abnormal   Collection Time: 12/07/14  6:00 PM  Result Value Ref Range   Sodium 131 (L) 135 - 145 mmol/L   Potassium 3.3 (L) 3.5 - 5.1 mmol/L   Chloride 105 96 - 112  mmol/L   CO2 19 19 - 32 mmol/L   Glucose, Bld 86 70 - 99 mg/dL   BUN 29 (H) 6 - 23 mg/dL   Creatinine, Ser 2.40 (H) 0.50 - 1.35 mg/dL   Calcium 6.7 (L) 8.4 - 10.5 mg/dL   GFR calc non Af Amer 32 (L) >90 mL/min   GFR calc Af Amer 37 (L) >90 mL/min    Comment: (NOTE) The eGFR has been calculated using the CKD EPI equation. This calculation has not been validated in all clinical situations. eGFR's persistently <90 mL/min signify possible Chronic Kidney Disease.    Anion gap 7 5 - 15  Magnesium     Status: None   Collection Time: 12/07/14  6:00 PM  Result Value Ref Range   Magnesium 2.3 1.5 - 2.5 mg/dL  Phosphorus     Status: None   Collection Time: 12/07/14  6:00 PM  Result Value Ref Range   Phosphorus 3.9 2.3 - 4.6 mg/dL  CBC with Differential/Platelet     Status: Abnormal   Collection Time: 12/07/14  6:00 PM  Result Value Ref Range   WBC 43.2 (H) 4.0 - 10.5 K/uL   RBC 2.51 (L) 4.22 - 5.81 MIL/uL   Hemoglobin 7.0 (L) 13.0 - 17.0 g/dL   HCT 21.7 (L) 39.0 - 52.0 %   MCV 86.5 78.0 - 100.0 fL   MCH 27.9 26.0 - 34.0 pg   MCHC 32.3 30.0 - 36.0 g/dL   RDW 16.9 (H) 11.5 - 15.5 %   Platelets 245 150 - 400 K/uL   Neutrophils Relative % 90 (H) 43 - 77 %   Lymphocytes Relative 6 (L) 12 - 46 %   Monocytes Relative 4 3 - 12 %   Eosinophils Relative 0 0 - 5 %   Basophils Relative 0 0 - 1 %   Neutro Abs 38.9 (H) 1.7 - 7.7 K/uL   Lymphs Abs 2.6 0.7 - 4.0 K/uL   Monocytes Absolute 1.7 (H) 0.1 - 1.0 K/uL   Eosinophils Absolute 0.0 0.0 - 0.7 K/uL   Basophils Absolute 0.0 0.0 - 0.1 K/uL   RBC Morphology POLYCHROMASIA PRESENT    WBC Morphology MILD LEFT SHIFT (1-5% METAS, OCC MYELO, OCC BANDS)     Comment: ATYPICAL LYMPHOCYTES TOXIC GRANULATION   Glucose, capillary     Status: None   Collection Time: 12/07/14  8:42 PM  Result Value Ref Range   Glucose-Capillary 81 70 - 99 mg/dL  Glucose, capillary     Status: None   Collection Time: 12/08/14  1:02 AM  Result Value Ref Range    Glucose-Capillary 83 70 - 99 mg/dL  Glucose, capillary     Status: None   Collection Time: 12/08/14  4:28 AM  Result Value Ref Range   Glucose-Capillary 72 70 - 99 mg/dL  Hepatic function panel     Status: Abnormal (Preliminary result)   Collection Time: 12/08/14  7:03 AM  Result Value Ref Range   Total Protein 3.8 (L) 6.0 - 8.3 g/dL   Albumin PENDING 3.5 - 5.2 g/dL   AST 18 0 - 37 U/L   ALT 10 0 - 53 U/L   Alkaline Phosphatase 92 39 - 117 U/L   Total Bilirubin 0.5 0.3 - 1.2 mg/dL   Bilirubin, Direct 0.1 0.0 - 0.5 mg/dL   Indirect Bilirubin 0.4 0.3 - 0.9 mg/dL    Imaging / Studies: Dg Chest Port 1 View  12/07/2014   CLINICAL DATA:  Pulmonary edema.  EXAM: PORTABLE CHEST - 1 VIEW  COMPARISON:  12/06/2014  FINDINGS: Low lung volumes with bibasilar atelectasis and vascular congestion, similar to prior study. Lucency under the right hemidiaphragm again noted, possibly pneumoperitoneum. NG tube and right central line remain in place, unchanged.  IMPRESSION: Low lung volumes with bibasilar atelectasis.  Lucency under the right hemidiaphragm again noted, possibly pneumoperitoneum related to recent surgery. May consider decubitus view of the abdomen to assess degree of pneumoperitoneum if felt clinically indicated.   Electronically Signed   By: Rolm Baptise M.D.   On: 12/07/2014 08:50   Dg Chest Port 1 View  12/06/2014   CLINICAL DATA:  Edema  EXAM: PORTABLE CHEST - 1 VIEW  COMPARISON:  12/04/2014  FINDINGS: Low lung volumes with vascular crowding. Possible mild interstitial edema with bibasilar atelectasis. No pneumothorax.  The heart is normal in size.  Right IJ venous catheter terminates at the cavoatrial junction.  Enteric tube traverses in the distal gastric antrum.  Lucency beneath the right hemidiaphragm could suggest free air related to recent surgery.  IMPRESSION: Low lung volumes with vascular crowding and possible mild interstitial edema.  Bibasilar atelectasis.  Support apparatus as above.    Electronically Signed   By: Julian Hy M.D.   On: 12/06/2014 09:45   Dg Abd Portable 1v  12/07/2014   CLINICAL DATA:  Nasogastric tube placement.  Initial encounter.  EXAM: PORTABLE ABDOMEN - 1 VIEW  COMPARISON:  Abdominal radiograph performed 12/04/2014  FINDINGS: The patient's enteric tube is noted ending overlying the body of the stomach.  The visualized bowel gas pattern is unremarkable. A bowel suture line is noted at the left lower quadrant. An ostomy is seen at the right lower quadrant. Scattered air and stool filled loops of colon are seen; no abnormal dilatation of small bowel loops is seen to suggest small bowel obstruction. No free intra-abdominal air is identified, though evaluation for free air is limited on a single supine view.  The visualized osseous structures are within normal limits; the sacroiliac joints are unremarkable in appearance.  A drainage catheter is noted overlying the upper pelvis.  IMPRESSION: 1. Enteric tube noted ending overlying the body of the stomach. 2. Unremarkable bowel gas pattern; postoperative change at the lower quadrants. No free intra-abdominal air seen.   Electronically Signed   By: Garald Balding M.D.   On: 12/07/2014 06:32  Medications / Allergies: per chart  Antibiotics: Anti-infectives    Start     Dose/Rate Route Frequency Ordered Stop   12/07/14 1300  fluconazole (DIFLUCAN) IVPB 200 mg     200 mg 100 mL/hr over 60 Minutes Intravenous Every 24 hours 12/07/14 1102     12/06/14 1300  fluconazole (DIFLUCAN) IVPB 400 mg  Status:  Discontinued     400 mg 100 mL/hr over 120 Minutes Intravenous Every 24 hours 12/06/14 1119 12/07/14 1102   12/06/14 0000  micafungin (MYCAMINE) 100 mg in sodium chloride 0.9 % 100 mL IVPB  Status:  Discontinued     100 mg 100 mL/hr over 1 Hours Intravenous Daily 12/05/14 1108 12/05/14 1332   12/05/14 1500  micafungin (MYCAMINE) 100 mg in sodium chloride 0.9 % 100 mL IVPB  Status:  Discontinued     100 mg 100  mL/hr over 1 Hours Intravenous Daily 12/05/14 1332 12/06/14 1045   12/05/14 1200  metroNIDAZOLE (FLAGYL) IVPB 500 mg     500 mg 100 mL/hr over 60 Minutes Intravenous Every 8 hours 12/05/14 1108     12/05/14 1000  micafungin (MYCAMINE) 100 mg in sodium chloride 0.9 % 100 mL IVPB  Status:  Discontinued     100 mg 100 mL/hr over 1 Hours Intravenous Daily 12/04/14 2255 12/04/14 2343   12/05/14 0000  fluconazole (DIFLUCAN) IVPB 200 mg  Status:  Discontinued     200 mg 100 mL/hr over 60 Minutes Intravenous Every 24 hours 12/04/14 2259 12/05/14 1107   12/05/14 0000  vancomycin (VANCOCIN) IVPB 1000 mg/200 mL premix  Status:  Discontinued     1,000 mg 200 mL/hr over 60 Minutes Intravenous Every 8 hours 12/04/14 2356 12/06/14 0850   12/04/14 1515  piperacillin-tazobactam (ZOSYN) IVPB 3.375 g     3.375 g 12.5 mL/hr over 240 Minutes Intravenous Every 8 hours 12/04/14 1510     12/04/14 1415  vancomycin (VANCOCIN) 50 mg/mL oral solution 125 mg  Status:  Discontinued     125 mg Oral 4 times per day 12/04/14 1402 12/04/14 2346   12/04/14 1415  ciprofloxacin (CIPRO) IVPB 400 mg  Status:  Discontinued     400 mg 200 mL/hr over 60 Minutes Intravenous Every 12 hours 12/04/14 1402 12/04/14 1504       Note: Portions of this report may have been transcribed using voice recognition software. Every effort was made to ensure accuracy; however, inadvertent computerized transcription errors may be present.   Any transcriptional errors that result from this process are unintentional.     Adin Hector, M.D., F.A.C.S. Gastrointestinal and Minimally Invasive Surgery [facility] [address], [place], [place] [address] [phone] Main / Paging   12/08/2014

## 2014-12-09 LAB — COMPREHENSIVE METABOLIC PANEL
ALT: 10 U/L (ref 0–53)
AST: 20 U/L (ref 0–37)
Albumin: <1 g/dL — ABNORMAL LOW (ref 3.5–5.2)
Alkaline Phosphatase: 109 U/L (ref 39–117)
Anion gap: 8 (ref 5–15)
BILIRUBIN TOTAL: 0.6 mg/dL (ref 0.3–1.2)
BUN: 27 mg/dL — ABNORMAL HIGH (ref 6–23)
CALCIUM: 6.8 mg/dL — AB (ref 8.4–10.5)
CHLORIDE: 105 mmol/L (ref 96–112)
CO2: 22 mmol/L (ref 19–32)
Creatinine, Ser: 2.34 mg/dL — ABNORMAL HIGH (ref 0.50–1.35)
GFR calc Af Amer: 38 mL/min — ABNORMAL LOW (ref >90)
GFR calc non Af Amer: 33 mL/min — ABNORMAL LOW (ref >90)
Glucose, Bld: 69 mg/dL — ABNORMAL LOW (ref 70–99)
Potassium: 3 mmol/L — ABNORMAL LOW (ref 3.5–5.1)
Sodium: 135 mmol/L (ref 135–145)
Total Protein: 3.9 g/dL — ABNORMAL LOW (ref 6.0–8.3)

## 2014-12-09 LAB — ANAEROBIC CULTURE

## 2014-12-09 LAB — CBC
HEMATOCRIT: 22.2 % — AB (ref 39.0–52.0)
HEMOGLOBIN: 7 g/dL — AB (ref 13.0–17.0)
MCH: 27.1 pg (ref 26.0–34.0)
MCHC: 31.5 g/dL (ref 30.0–36.0)
MCV: 86 fL (ref 78.0–100.0)
Platelets: 265 10*3/uL (ref 150–400)
RBC: 2.58 MIL/uL — ABNORMAL LOW (ref 4.22–5.81)
RDW: 17 % — ABNORMAL HIGH (ref 11.5–15.5)
WBC: 26.7 10*3/uL — ABNORMAL HIGH (ref 4.0–10.5)

## 2014-12-09 LAB — GLUCOSE, CAPILLARY
GLUCOSE-CAPILLARY: 63 mg/dL — AB (ref 70–99)
GLUCOSE-CAPILLARY: 74 mg/dL (ref 70–99)
Glucose-Capillary: 71 mg/dL (ref 70–99)
Glucose-Capillary: 68 mg/dL — ABNORMAL LOW (ref 70–99)
Glucose-Capillary: 64 mg/dL — ABNORMAL LOW (ref 70–99)
Glucose-Capillary: 102 mg/dL — ABNORMAL HIGH (ref 70–99)
Glucose-Capillary: 73 mg/dL (ref 70–99)
Glucose-Capillary: 83 mg/dL (ref 70–99)
Glucose-Capillary: 89 mg/dL (ref 70–99)

## 2014-12-09 LAB — MAGNESIUM: MAGNESIUM: 2.2 mg/dL (ref 1.5–2.5)

## 2014-12-09 LAB — PHOSPHORUS: Phosphorus: 3.2 mg/dL (ref 2.3–4.6)

## 2014-12-09 MED ORDER — FENTANYL CITRATE (PF) 100 MCG/2ML IJ SOLN
25.0000 ug | INTRAMUSCULAR | Status: DC | PRN
  Administered 2014-12-11 – 2014-12-12 (×8): 25 ug via INTRAVENOUS
  Filled 2014-12-09 (×8): qty 2

## 2014-12-09 MED ORDER — PROMETHAZINE HCL 25 MG/ML IJ SOLN
6.2500 mg | Freq: Four times a day (QID) | INTRAMUSCULAR | Status: DC | PRN
  Administered 2014-12-14 – 2014-12-16 (×3): 12.5 mg via INTRAVENOUS
  Administered 2014-12-16: 6.25 mg via INTRAVENOUS
  Administered 2014-12-17: 12.5 mg via INTRAVENOUS
  Administered 2014-12-17 – 2014-12-18 (×3): 6.25 mg via INTRAVENOUS
  Filled 2014-12-09 (×9): qty 1

## 2014-12-09 MED ORDER — POTASSIUM CHLORIDE 10 MEQ/100ML IV SOLN
10.0000 meq | INTRAVENOUS | Status: AC
  Administered 2014-12-09 – 2014-12-10 (×5): 10 meq via INTRAVENOUS
  Filled 2014-12-09 (×5): qty 100

## 2014-12-09 MED ORDER — LORAZEPAM 2 MG/ML IJ SOLN
0.5000 mg | Freq: Three times a day (TID) | INTRAMUSCULAR | Status: DC | PRN
  Administered 2014-12-13 – 2014-12-19 (×7): 0.5 mg via INTRAVENOUS
  Filled 2014-12-09 (×7): qty 1

## 2014-12-09 MED ORDER — MORPHINE SULFATE ER 15 MG PO TBCR
15.0000 mg | EXTENDED_RELEASE_TABLET | Freq: Two times a day (BID) | ORAL | Status: DC
  Administered 2014-12-09 – 2014-12-20 (×21): 15 mg via ORAL
  Filled 2014-12-09 (×22): qty 1

## 2014-12-09 MED ORDER — DIPHENHYDRAMINE HCL 50 MG/ML IJ SOLN: 12.5000 mg | Freq: Three times a day (TID) | INTRAMUSCULAR | Status: DC | PRN

## 2014-12-09 MED ORDER — DEXTROSE 50 % IV SOLN
INTRAVENOUS | Status: AC
  Filled 2014-12-09: qty 50

## 2014-12-09 MED ORDER — DEXTROSE 50 % IV SOLN
25.0000 mL | Freq: Once | INTRAVENOUS | Status: AC
  Administered 2014-12-09: 25 mL via INTRAVENOUS

## 2014-12-09 NOTE — Progress Notes (Addendum)
Pt blood sugar 68. Orange juice given. Will recheck and do further interventions if neccessary. 0736 Gave 38ml iv Dextrose bc pt could not tolerate PO orange juice. BS now 102.

## 2014-12-09 NOTE — Progress Notes (Signed)
5 Days Post-Op  Subjective: Pt with no new issues.  States that he is tol FLD well. Feels some hallucinations with pain Rx  Objective: Vital signs in last 24 hours: Temp:  [98.2 F (36.8 C)-98.5 F (36.9 C)] 98.3 F (36.8 C) (04/30 0439) Pulse Rate:  [94-123] 95 (04/30 0500) Resp:  [12-18] 15 (04/30 0500) BP: (121-139)/(82-96) 122/92 mmHg (04/30 0500) SpO2:  [94 %-98 %] 94 % (04/30 0500) Last BM Date:  (PTA)  Intake/Output from previous day: 04/29 0701 - 04/30 0700 In: 1343.3 [P.O.:120; I.V.:973.3; IV Piggyback:250] Out: 2025 [Urine:1175; Drains:550; Stool:100] Intake/Output this shift:    General appearance: alert and cooperative GI: ostomy pink/patent, vac in place.  Lab Results:   Recent Labs  12/08/14 0800 12/09/14 0418  WBC 35.6* 26.7*  HGB 7.2* 7.0*  HCT 22.5* 22.2*  PLT 276 265   BMET  Recent Labs  12/07/14 1800 12/09/14 0418  NA 131* 135  K 3.3* 3.0*  CL 105 105  CO2 19 22  GLUCOSE 86 69*  BUN 29* 27*  CREATININE 2.40* 2.34*  CALCIUM 6.7* 6.8*   PT/INR No results for input(s): LABPROT, INR in the last 72 hours. ABG No results for input(s): PHART, HCO3 in the last 72 hours.  Invalid input(s): PCO2, PO2  Studies/Results: Dg Chest Port 1 View  12/07/2014   CLINICAL DATA:  Pulmonary edema.  EXAM: PORTABLE CHEST - 1 VIEW  COMPARISON:  12/06/2014  FINDINGS: Low lung volumes with bibasilar atelectasis and vascular congestion, similar to prior study. Lucency under the right hemidiaphragm again noted, possibly pneumoperitoneum. NG tube and right central line remain in place, unchanged.  IMPRESSION: Low lung volumes with bibasilar atelectasis.  Lucency under the right hemidiaphragm again noted, possibly pneumoperitoneum related to recent surgery. May consider decubitus view of the abdomen to assess degree of pneumoperitoneum if felt clinically indicated.   Electronically Signed   By: Rolm Baptise M.D.   On: 12/07/2014 08:50     Anti-infectives: Anti-infectives    Start     Dose/Rate Route Frequency Ordered Stop   12/07/14 1300  fluconazole (DIFLUCAN) IVPB 200 mg     200 mg 100 mL/hr over 60 Minutes Intravenous Every 24 hours 12/07/14 1102     12/06/14 1300  fluconazole (DIFLUCAN) IVPB 400 mg  Status:  Discontinued     400 mg 100 mL/hr over 120 Minutes Intravenous Every 24 hours 12/06/14 1119 12/07/14 1102   12/06/14 0000  micafungin (MYCAMINE) 100 mg in sodium chloride 0.9 % 100 mL IVPB  Status:  Discontinued     100 mg 100 mL/hr over 1 Hours Intravenous Daily 12/05/14 1108 12/05/14 1332   12/05/14 1500  micafungin (MYCAMINE) 100 mg in sodium chloride 0.9 % 100 mL IVPB  Status:  Discontinued     100 mg 100 mL/hr over 1 Hours Intravenous Daily 12/05/14 1332 12/06/14 1045   12/05/14 1200  metroNIDAZOLE (FLAGYL) IVPB 500 mg  Status:  Discontinued     500 mg 100 mL/hr over 60 Minutes Intravenous Every 8 hours 12/05/14 1108 12/08/14 0805   12/05/14 1000  micafungin (MYCAMINE) 100 mg in sodium chloride 0.9 % 100 mL IVPB  Status:  Discontinued     100 mg 100 mL/hr over 1 Hours Intravenous Daily 12/04/14 2255 12/04/14 2343   12/05/14 0000  fluconazole (DIFLUCAN) IVPB 200 mg  Status:  Discontinued     200 mg 100 mL/hr over 60 Minutes Intravenous Every 24 hours 12/04/14 2259 12/05/14 1107   12/05/14 0000  vancomycin (VANCOCIN) IVPB 1000 mg/200 mL premix  Status:  Discontinued     1,000 mg 200 mL/hr over 60 Minutes Intravenous Every 8 hours 12/04/14 2356 12/06/14 0850   12/04/14 1515  piperacillin-tazobactam (ZOSYN) IVPB 3.375 g     3.375 g 12.5 mL/hr over 240 Minutes Intravenous Every 8 hours 12/04/14 1510     12/04/14 1415  vancomycin (VANCOCIN) 50 mg/mL oral solution 125 mg  Status:  Discontinued     125 mg Oral 4 times per day 12/04/14 1402 12/04/14 2346   12/04/14 1415  ciprofloxacin (CIPRO) IVPB 400 mg  Status:  Discontinued     400 mg 200 mL/hr over 60 Minutes Intravenous Every 12 hours 12/04/14 1402  12/04/14 1504      Assessment/Plan: s/p Procedure(s): EXPLORATORY LAPAROTOMY, Total Colectomy (N/A) Con't abx.  WBC decreasing as expected. Mobilize Not getting prn pain Rx Adv to soft   LOS: 5 days    Rosario Jacks., [facility] 12/09/2014

## 2014-12-09 NOTE — Progress Notes (Signed)
[place] TEAM 1 - Stepdown/ICU TEAM Progress Note  Vincent Black ERX:[id number] DOB: 1970/09/04 DOA: 12/04/2014 PCP: No PCP Per Patient  Admit HPI / Brief Narrative: 44 y.o. M with Crohn's and C.diff brought to [facility] 4/25 for worsening abdominal pain. Found to have perforated sigmoid colon, perforated cecal toxic megacolon, and small bowel fistula. He was taken to the OR for ex lap and small bowel resection with anastomosis, total abdominal colectomy with end ileostomy. Due to significant stool contamination, he was transferred to the ICU post op for close monitoring.  Significant Events: 4/25: Admitted for severe abd pain. Chest x-ray noted extensive free intraperitoneal air.  Underwent ex lap with SB resection and total abdominal colectomy with end ileostomy. 4/26: Poor uop despite increased IVF and 4L in boluses, shock pressors 4/27: Improved LA, with increased output urine. Off Neo  HPI/Subjective: Patient is lethargic/sedate today.  His wife states he falls asleep in the middle of attempting to drink.  He is in no other acute distress.  There's been no vomiting or complaints of abdominal pain.  Assessment/Plan:  C diff toxic megacolon with perforation sigmoid colon w/ septic shock s/p total colectomy with end ileostomy, small bowel resection with anastomosis Ongoing care of wounds and ostomy and nutrition per general surgery - ongoing care of antibiotic dosing per infectious disease  Acute renal failure Renal function slowly improving - bicarbonate stable  Sinus tachycardia Appears to be physiologic - follow  Hypokalemia Due to poor intake as well as IV bicarbonate - continue to supplement and follow  Hyponatremia Likely due to a combination of hypovolemia as well as administration of hypotonic IV fluid - resolving with adjustment and IV fluid  Anemia - chronic and in setting of acute blood loss post op Transfuse as needed to keep hemoglobin greater than  6.9  Thrombocytosis - chronic Stable  H/o Crohn's  Severe malnutrition in the context of acute illness Diet per general surgery  Code Status: FULL Family Communication: Spoke with wife at bedside again today Disposition Plan: SDU  Consultants: General surgery Pulmonary/critical care GI - Dr. Benson [place] Infectious Disease  Antibiotics: Zosyn 4/25 > Flagyl 4/26 > Fluconazole 4/27 >  DVT prophylaxis: Subcutaneous heparin  Objective: Blood pressure 132/84, pulse 101, temperature 97.2 F (36.2 C), temperature source Axillary, resp. rate 17, height 5\' 9"  (1.753 m), weight 66.9 kg (147 lb 7.8 oz), SpO2 97 %.  Intake/Output Summary (Last 24 hours) at 12/09/14 1555 Last data filed at 12/09/14 1328  Gross per 24 hour  Intake 1123.33 ml  Output   1625 ml  Net -501.67 ml   Exam: General: No acute respiratory distress - lethargic Lungs: Clear to auscultation bilaterally without wheezes or crackles Cardiovascular: Regular rate and rhythm without murmur gallop or rub Abdomen: Nondistended, soft, no ascites, no appreciable mass Extremities: No significant cyanosis, clubbing, or edema bilateral lower extremities  Data Reviewed: Basic Metabolic Panel:  Recent Labs Lab 12/05/14 1657 12/06/14 0311 12/06/14 1830 12/07/14 0746 12/07/14 1800 12/09/14 0418  NA 132* 133* 132* 134* 131* 135  K 3.6 3.4* 3.8 3.5 3.3* 3.0*  CL 104 107 107 105 105 105  CO2 19 19 16* 18* 19 22  GLUCOSE 145* 120* 123* 82 86 69*  BUN 16 21 27* 28* 29* 27*  CREATININE 1.82* 2.16* 2.49* 2.73* 2.40* 2.34*  CALCIUM 6.3* 6.2* 6.8* 6.8* 6.7* 6.8*  MG 1.6 1.5 2.6* 2.3 2.3 2.2  PHOS 3.2  --  4.2 4.1 3.9 3.2    Liver  Function Tests:  Recent Labs Lab 12/04/14 1625 12/06/14 0311 12/07/14 0445 12/08/14 0703 12/09/14 0418  AST 26 18 16 18 20   ALT 8 9 11 10 10   ALKPHOS 111 154* 78 92 109  BILITOT 0.9 0.5 0.6 0.5 0.6  PROT 5.9* 3.6* 3.7* 3.8* 3.9*  ALBUMIN 1.8* <1.0* <1.0* <1.0* <1.0*     Coags:  Recent Labs Lab 12/04/14 1625  INR 1.52*    Recent Labs Lab 12/04/14 1625  APTT 35    CBC:  Recent Labs Lab 12/06/14 0311 12/06/14 1830 12/07/14 0746 12/07/14 1800 12/08/14 0800 12/09/14 0418  WBC 42.6* 60.9* 48.7* 43.2* 35.6* 26.7*  NEUTROABS 39.6* 57.9* 45.7* 38.9* 32.4*  --   HGB 7.4* 8.1* 7.1* 7.0* 7.2* 7.0*  HCT 23.0* 25.8* 22.1* 21.7* 22.5* 22.2*  MCV 86.1 87.5 85.7 86.5 85.2 86.0  PLT 222 280 248 245 276 265   CBG:  Recent Labs Lab 12/09/14 0551 12/09/14 0650 12/09/14 0734 12/09/14 0907 12/09/14 1253  GLUCAP 63* 64* 102* 73 83    Recent Results (from the past 240 hour(s))  Culture, blood (x 2)     Status: None (Preliminary result)   Collection Time: 12/04/14  4:50 PM  Result Value Ref Range Status   Specimen Description BLOOD LEFT HAND  Final   Special Requests BOTTLES DRAWN AEROBIC ONLY 2CC  Final   Culture   Final           BLOOD CULTURE RECEIVED NO GROWTH TO DATE CULTURE WILL BE HELD FOR 5 DAYS BEFORE ISSUING A FINAL NEGATIVE REPORT Performed at [facility]    Report Status PENDING  Incomplete  Culture, blood (x 2)     Status: None (Preliminary result)   Collection Time: 12/04/14  4:50 PM  Result Value Ref Range Status   Specimen Description BLOOD RIGHT HAND  Final   Special Requests BOTTLES DRAWN AEROBIC ONLY 5CC  Final   Culture   Final           BLOOD CULTURE RECEIVED NO GROWTH TO DATE CULTURE WILL BE HELD FOR 5 DAYS BEFORE ISSUING A FINAL NEGATIVE REPORT Performed at [facility]    Report Status PENDING  Incomplete  MRSA PCR Screening     Status: None   Collection Time: 12/04/14  5:32 PM  Result Value Ref Range Status   MRSA by PCR NEGATIVE NEGATIVE Final    Comment:        The GeneXpert MRSA Assay (FDA approved for NASAL specimens only), is one component of a comprehensive MRSA colonization surveillance program. It is not intended to diagnose MRSA infection nor to guide or monitor treatment  for MRSA infections.   Wound culture     Status: None   Collection Time: 12/04/14  7:10 PM  Result Value Ref Range Status   Specimen Description ABDOMEN  Final   Special Requests PATIENT ON FOLLOWING ZINACEF  Final   Gram Stain   Final    FEW WBC PRESENT,BOTH PMN AND MONONUCLEAR NO SQUAMOUS EPITHELIAL CELLS SEEN NO ORGANISMS SEEN Performed at [facility]    Culture   Final    FEW CANDIDA ALBICANS Performed at [facility]    Report Status 12/07/2014 FINAL  Final  Anaerobic culture     Status: None   Collection Time: 12/04/14  7:10 PM  Result Value Ref Range Status   Specimen Description WOUND ABDOMEN  Final   Special Requests PT ON ZINACEF  Final   Gram  Stain   Final    FEW WBC PRESENT,BOTH PMN AND MONONUCLEAR NO SQUAMOUS EPITHELIAL CELLS SEEN NO ORGANISMS SEEN Performed at [facility]    Culture   Final    NO ANAEROBES ISOLATED Performed at [facility]    Report Status 12/09/2014 FINAL  Final     Studies:   Recent x-ray studies have been reviewed in detail by the Attending Physician  Scheduled Meds:  Scheduled Meds: . acetaminophen  1,000 mg Oral TID  . antiseptic oral rinse  7 mL Mouth Rinse q12n4p  . chlorhexidine  15 mL Mouth Rinse BID  . feeding supplement (ENSURE ENLIVE)  237 mL Oral BID BM  . feeding supplement (PRO-STAT SUGAR FREE 64)  30 mL Oral BID WC  . fluconazole (DIFLUCAN) IV  200 mg Intravenous Q24H  . heparin subcutaneous  5,000 Units Subcutaneous 3 times per day  . insulin aspart  0-9 Units Subcutaneous 6 times per day  . lip balm  1 application Topical BID  . metoprolol tartrate  12.5 mg Oral BID  . mirtazapine  15 mg Oral QHS  . morphine  30 mg Oral Q12H  . piperacillin-tazobactam (ZOSYN)  IV  3.375 g Intravenous Q8H  . prednisoLONE acetate  1 drop Both Eyes BID  . saccharomyces boulardii  250 mg Oral BID    Time spent on care of this patient: 35 mins  Deniyah Dillavou T , MD   [facility] Office  [phone] Pager - Text Page per Shea Evans as per below:  On-Call/Text Page:      [email or web address]      password [email or web address]  If 7PM-7AM, please contact night-coverage [email or web address] Password [email or web address] 12/09/2014, 3:55 PM   LOS: 5 days

## 2014-12-09 NOTE — Progress Notes (Signed)
CNA advised nurse that Pt blood sugar is 61. Gave orange juice and rechecked. Pt BS came up to 82.

## 2014-12-10 LAB — BASIC METABOLIC PANEL
Anion gap: 7 (ref 5–15)
BUN: 22 mg/dL — ABNORMAL HIGH (ref 6–20)
CO2: 22 mmol/L (ref 22–32)
Calcium: 6.8 mg/dL — ABNORMAL LOW (ref 8.9–10.3)
Chloride: 108 mmol/L (ref 101–111)
Creatinine, Ser: 2.03 mg/dL — ABNORMAL HIGH (ref 0.61–1.24)
GFR, EST AFRICAN AMERICAN: 45 mL/min — AB (ref >60)
GFR, EST NON AFRICAN AMERICAN: 39 mL/min — AB (ref >60)
Glucose, Bld: 47 mg/dL — ABNORMAL LOW (ref 70–99)
POTASSIUM: 3.6 mmol/L (ref 3.5–5.1)
Sodium: 137 mmol/L (ref 135–145)

## 2014-12-10 LAB — CBC
HEMATOCRIT: 23.9 % — AB (ref 39.0–52.0)
HEMOGLOBIN: 7.5 g/dL — AB (ref 13.0–17.0)
MCH: 27.1 pg (ref 26.0–34.0)
MCHC: 31.4 g/dL (ref 30.0–36.0)
MCV: 86.3 fL (ref 78.0–100.0)
Platelets: 321 10*3/uL (ref 150–400)
RBC: 2.77 MIL/uL — AB (ref 4.22–5.81)
RDW: 17.1 % — ABNORMAL HIGH (ref 11.5–15.5)
WBC: 38.2 10*3/uL — ABNORMAL HIGH (ref 4.0–10.5)

## 2014-12-10 LAB — GLUCOSE, CAPILLARY
GLUCOSE-CAPILLARY: 103 mg/dL — AB (ref 70–99)
GLUCOSE-CAPILLARY: 89 mg/dL (ref 70–99)
GLUCOSE-CAPILLARY: 105 mg/dL — AB (ref 70–99)
Glucose-Capillary: 108 mg/dL — ABNORMAL HIGH (ref 70–99)
Glucose-Capillary: 61 mg/dL — ABNORMAL LOW (ref 70–99)
Glucose-Capillary: 68 mg/dL — ABNORMAL LOW (ref 70–99)
Glucose-Capillary: 80 mg/dL (ref 70–99)

## 2014-12-10 MED ORDER — DEXTROSE 50 % IV SOLN
INTRAVENOUS | Status: AC
  Filled 2014-12-10: qty 50

## 2014-12-10 MED ORDER — INSULIN ASPART 100 UNIT/ML ~~LOC~~ SOLN: 0.0000 [IU] | Freq: Three times a day (TID) | SUBCUTANEOUS | Status: DC

## 2014-12-10 MED ORDER — DEXTROSE 50 % IV SOLN: 25.0000 mL | Freq: Once | INTRAVENOUS | Status: DC

## 2014-12-10 MED ORDER — POTASSIUM CHLORIDE 2 MEQ/ML IV SOLN
INTRAVENOUS | Status: DC
  Administered 2014-12-10: 12:00:00 via INTRAVENOUS
  Filled 2014-12-10 (×2): qty 1000

## 2014-12-10 MED ORDER — POTASSIUM CHLORIDE 2 MEQ/ML IV SOLN
INTRAVENOUS | Status: DC
  Administered 2014-12-10 – 2014-12-11 (×2): via INTRAVENOUS
  Filled 2014-12-10 (×6): qty 1000

## 2014-12-10 NOTE — Progress Notes (Signed)
6 Days Post-Op  Subjective: Hypoglycemic episodes overnight, tolerating soft diet  Objective: Vital signs in last 24 hours: Temp:  [97.2 F (36.2 C)-98.4 F (36.9 C)] 98.2 F (36.8 C) (05/01 0736) Pulse Rate:  [96-111] 104 (05/01 0800) Resp:  [11-21] 14 (05/01 0800) BP: (101-132)/(69-101) 128/97 mmHg (05/01 0800) SpO2:  [94 %-98 %] 97 % (05/01 0800) Weight:  [77.2 kg (170 lb 3.1 oz)] 77.2 kg (170 lb 3.1 oz) (05/01 0600) Last BM Date: 12/10/14  Intake/Output from previous day: 04/30 0701 - 05/01 0700 In: 1650 [I.V.:1450; IV Piggyback:200] Out: 1670 [Urine:450; Drains:395; Stool:825] Intake/Output this shift: Total I/O In: 60 [P.O.:60] Out: 300 [Urine:300]  General appearance: alert and cooperative GI: VAC on midline, ileostomy with liquid stool, drain serosang  Lab Results:   Recent Labs  12/09/14 0418 12/10/14 0445  WBC 26.7* 38.2*  HGB 7.0* 7.5*  HCT 22.2* 23.9*  PLT 265 321   BMET  Recent Labs  12/09/14 0418 12/10/14 0445  NA 135 137  K 3.0* 3.6  CL 105 108  CO2 22 22  GLUCOSE 69* 47*  BUN 27* 22*  CREATININE 2.34* 2.03*  CALCIUM 6.8* 6.8*   PT/INR No results for input(s): LABPROT, INR in the last 72 hours. ABG No results for input(s): PHART, HCO3 in the last 72 hours.  Invalid input(s): PCO2, PO2  Studies/Results: No results found.  Anti-infectives: Anti-infectives    Start     Dose/Rate Route Frequency Ordered Stop   12/07/14 1300  fluconazole (DIFLUCAN) IVPB 200 mg     200 mg 100 mL/hr over 60 Minutes Intravenous Every 24 hours 12/07/14 1102     12/06/14 1300  fluconazole (DIFLUCAN) IVPB 400 mg  Status:  Discontinued     400 mg 100 mL/hr over 120 Minutes Intravenous Every 24 hours 12/06/14 1119 12/07/14 1102   12/06/14 0000  micafungin (MYCAMINE) 100 mg in sodium chloride 0.9 % 100 mL IVPB  Status:  Discontinued     100 mg 100 mL/hr over 1 Hours Intravenous Daily 12/05/14 1108 12/05/14 1332   12/05/14 1500  micafungin (MYCAMINE) 100 mg  in sodium chloride 0.9 % 100 mL IVPB  Status:  Discontinued     100 mg 100 mL/hr over 1 Hours Intravenous Daily 12/05/14 1332 12/06/14 1045   12/05/14 1200  metroNIDAZOLE (FLAGYL) IVPB 500 mg  Status:  Discontinued     500 mg 100 mL/hr over 60 Minutes Intravenous Every 8 hours 12/05/14 1108 12/08/14 0805   12/05/14 1000  micafungin (MYCAMINE) 100 mg in sodium chloride 0.9 % 100 mL IVPB  Status:  Discontinued     100 mg 100 mL/hr over 1 Hours Intravenous Daily 12/04/14 2255 12/04/14 2343   12/05/14 0000  fluconazole (DIFLUCAN) IVPB 200 mg  Status:  Discontinued     200 mg 100 mL/hr over 60 Minutes Intravenous Every 24 hours 12/04/14 2259 12/05/14 1107   12/05/14 0000  vancomycin (VANCOCIN) IVPB 1000 mg/200 mL premix  Status:  Discontinued     1,000 mg 200 mL/hr over 60 Minutes Intravenous Every 8 hours 12/04/14 2356 12/06/14 0850   12/04/14 1515  piperacillin-tazobactam (ZOSYN) IVPB 3.375 g     3.375 g 12.5 mL/hr over 240 Minutes Intravenous Every 8 hours 12/04/14 1510     12/04/14 1415  vancomycin (VANCOCIN) 50 mg/mL oral solution 125 mg  Status:  Discontinued     125 mg Oral 4 times per day 12/04/14 1402 12/04/14 2346   12/04/14 1415  ciprofloxacin (CIPRO) IVPB 400  mg  Status:  Discontinued     400 mg 200 mL/hr over 60 Minutes Intravenous Every 12 hours 12/04/14 1402 12/04/14 1504      Assessment/Plan: s/p Procedure(s): EXPLORATORY LAPAROTOMY, Total Colectomy (N/A) POD#6 Tolerating soft diet Zosyn/diflucan, WBC still up Change IVF to D5 NS with K [employer] I spoke with his father and wife  LOS: 6 days    Hetvi Shawhan E 12/10/2014

## 2014-12-10 NOTE — Progress Notes (Signed)
  [place] TEAM 1 - Stepdown/ICU TEAM  Chart / data / results reviewed.  Trauma Team has addresses all active medical issues today.  I have elected to not visit patient.  Will f/u in AM.  No Charge  Cherene Altes, MD [facility] For Consults/Admissions - Flow Manager - [phone] Office  [phone]  Contact MD directly via text page:      [email or web address]      password [facility]

## 2014-12-10 NOTE — Progress Notes (Signed)
ANTIBIOTIC CONSULT NOTE - Follow-Up  Pharmacy Consult for fluconazole and zosyn Indication: sepsis  No Known Allergies  Patient Measurements: Height: 5\' 9"  (175.3 cm) Weight: 170 lb 3.1 oz (77.2 kg) IBW/kg (Calculated) : 70.7  Vital Signs: Temp: 98.4 F (36.9 C) (05/01 1300) Temp Source: Oral (05/01 1300) BP: 124/99 mmHg (05/01 1300) Pulse Rate: 100 (05/01 1300) Intake/Output from previous day: 04/30 0701 - 05/01 0700 In: 1650 [I.V.:1450; IV Piggyback:200] Out: 1670 [Urine:450; Drains:395; Stool:825] Intake/Output from this shift: Total I/O In: 500 [P.O.:300; I.V.:200] Out: 361 [Urine:300; Drains:11; Stool:50]  Labs:  Recent Labs  12/07/14 1800 12/08/14 0800 12/09/14 0418 12/10/14 0445  WBC 43.2* 35.6* 26.7* 38.2*  HGB 7.0* 7.2* 7.0* 7.5*  PLT 245 276 265 321  CREATININE 2.40*  --  2.34* 2.03*   Estimated Creatinine Clearance: 47.4 mL/min (by C-G formula based on Cr of 2.03). No results for input(s): VANCOTROUGH, VANCOPEAK, VANCORANDOM, GENTTROUGH, GENTPEAK, GENTRANDOM, TOBRATROUGH, TOBRAPEAK, TOBRARND, AMIKACINPEAK, AMIKACINTROU, AMIKACIN in the last 72 hours.   Microbiology: Recent Results (from the past 720 hour(s))  Culture, blood (x 2)     Status: None (Preliminary result)   Collection Time: 12/04/14  4:50 PM  Result Value Ref Range Status   Specimen Description BLOOD LEFT HAND  Final   Special Requests BOTTLES DRAWN AEROBIC ONLY 2CC  Final   Culture   Final           BLOOD CULTURE RECEIVED NO GROWTH TO DATE CULTURE WILL BE HELD FOR 5 DAYS BEFORE ISSUING A FINAL NEGATIVE REPORT Performed at [facility]    Report Status PENDING  Incomplete  Culture, blood (x 2)     Status: None (Preliminary result)   Collection Time: 12/04/14  4:50 PM  Result Value Ref Range Status   Specimen Description BLOOD RIGHT HAND  Final   Special Requests BOTTLES DRAWN AEROBIC ONLY 5CC  Final   Culture   Final           BLOOD CULTURE RECEIVED NO GROWTH TO DATE CULTURE  WILL BE HELD FOR 5 DAYS BEFORE ISSUING A FINAL NEGATIVE REPORT Performed at [facility]    Report Status PENDING  Incomplete  MRSA PCR Screening     Status: None   Collection Time: 12/04/14  5:32 PM  Result Value Ref Range Status   MRSA by PCR NEGATIVE NEGATIVE Final    Comment:        The GeneXpert MRSA Assay (FDA approved for NASAL specimens only), is one component of a comprehensive MRSA colonization surveillance program. It is not intended to diagnose MRSA infection nor to guide or monitor treatment for MRSA infections.   Wound culture     Status: None   Collection Time: 12/04/14  7:10 PM  Result Value Ref Range Status   Specimen Description ABDOMEN  Final   Special Requests PATIENT ON FOLLOWING ZINACEF  Final   Gram Stain   Final    FEW WBC PRESENT,BOTH PMN AND MONONUCLEAR NO SQUAMOUS EPITHELIAL CELLS SEEN NO ORGANISMS SEEN Performed at [facility]    Culture   Final    FEW CANDIDA ALBICANS Performed at [facility]    Report Status 12/07/2014 FINAL  Final  Anaerobic culture     Status: None   Collection Time: 12/04/14  7:10 PM  Result Value Ref Range Status   Specimen Description WOUND ABDOMEN  Final   Special Requests PT ON ZINACEF  Final   Gram Stain   Final  FEW WBC PRESENT,BOTH PMN AND MONONUCLEAR NO SQUAMOUS EPITHELIAL CELLS SEEN NO ORGANISMS SEEN Performed at [facility]    Culture   Final    NO ANAEROBES ISOLATED Performed at [facility]    Report Status 12/09/2014 FINAL  Final    Medical History: Past Medical History  Diagnosis Date  . Crohn disease   . C. difficile colitis 09/30/2014  . Ankylosing spondylitis 09/30/2014  . Anxiety     Medications:  Scheduled:  . acetaminophen  1,000 mg Oral TID  . antiseptic oral rinse  7 mL Mouth Rinse q12n4p  . chlorhexidine  15 mL Mouth Rinse BID  . dextrose  25 mL Intravenous Once  . dextrose      . feeding supplement (ENSURE ENLIVE)  237 mL Oral BID BM   . feeding supplement (PRO-STAT SUGAR FREE 64)  30 mL Oral BID WC  . fluconazole (DIFLUCAN) IV  200 mg Intravenous Q24H  . heparin subcutaneous  5,000 Units Subcutaneous 3 times per day  . insulin aspart  0-9 Units Subcutaneous 6 times per day  . lip balm  1 application Topical BID  . metoprolol tartrate  12.5 mg Oral BID  . mirtazapine  15 mg Oral QHS  . morphine  15 mg Oral Q12H  . piperacillin-tazobactam (ZOSYN)  IV  3.375 g Intravenous Q8H  . prednisoLONE acetate  1 drop Both Eyes BID  . saccharomyces boulardii  250 mg Oral BID   Infusions:  . sodium chloride 0.9 % 1,000 mL with potassium chloride 20 mEq infusion 100 mL/hr at 12/10/14 1154   PRN: alum & mag hydroxide-simeth, butamben-tetracaine-benzocaine, diphenhydrAMINE, fentaNYL (SUBLIMAZE) injection, LORazepam, magic mouthwash, ondansetron **OR** ondansetron (ZOFRAN) IV, phenol, promethazine   Assessment: 44yo male w/ h/o Crohn's, most recent hospital discharge 3/30 after severe sepsis 2/2 intraabdominal abscess, now c/o sudden onset of RUQ pain, generalized weakness, and hematemesis, admitted to MICU after ex lap and total colectomy, to begin IV ABX for sepsis. Currently afebrile, WBC trending down overall, SCr improved a little.  Candida albicans growing in wound culture prompted switch from micafungin to fluconazole.  Plan:  Continue fluconazole to 200mg  IV every 24 hours due to decreased renal function (< 50 ml/min).  Could consider increasing to 400 mg daily if Scr continues to improve. Continue Zosyn IV 3.375 g every 8 hours.  Follow temperature, WBC, SCr, and clinical improvement.   Uvaldo Rising, BCPS  Clinical Pharmacist Pager [phone]  12/10/2014 1:58 PM

## 2014-12-11 LAB — CULTURE, BLOOD (ROUTINE X 2)
Culture: NO GROWTH
Culture: NO GROWTH

## 2014-12-11 LAB — BASIC METABOLIC PANEL
ANION GAP: 5 (ref 5–15)
BUN: 22 mg/dL — ABNORMAL HIGH (ref 6–20)
CO2: 23 mmol/L (ref 22–32)
CREATININE: 1.82 mg/dL — AB (ref 0.61–1.24)
Calcium: 6.9 mg/dL — ABNORMAL LOW (ref 8.9–10.3)
Chloride: 111 mmol/L (ref 101–111)
GFR calc Af Amer: 51 mL/min — ABNORMAL LOW (ref >60)
GFR calc non Af Amer: 44 mL/min — ABNORMAL LOW (ref >60)
Glucose, Bld: 128 mg/dL — ABNORMAL HIGH (ref 70–99)
Potassium: 3.7 mmol/L (ref 3.5–5.1)
SODIUM: 139 mmol/L (ref 135–145)

## 2014-12-11 LAB — GLUCOSE, CAPILLARY
GLUCOSE-CAPILLARY: 109 mg/dL — AB (ref 70–99)
GLUCOSE-CAPILLARY: 122 mg/dL — AB (ref 70–99)
GLUCOSE-CAPILLARY: 130 mg/dL — AB (ref 70–99)
Glucose-Capillary: 130 mg/dL — ABNORMAL HIGH (ref 70–99)
Glucose-Capillary: 103 mg/dL — ABNORMAL HIGH (ref 70–99)

## 2014-12-11 MED ORDER — POTASSIUM CHLORIDE 2 MEQ/ML IV SOLN
INTRAVENOUS | Status: DC
  Administered 2014-12-11 – 2014-12-14 (×5): via INTRAVENOUS
  Filled 2014-12-11 (×9): qty 1000

## 2014-12-11 MED ORDER — DEXTROSE-NACL 5-0.9 % IV SOLN
INTRAVENOUS | Status: DC
  Filled 2014-12-11 (×33): qty 50

## 2014-12-11 MED ORDER — ACETAMINOPHEN 325 MG PO TABS: 650.0000 mg | ORAL_TABLET | ORAL | Status: DC | PRN

## 2014-12-11 MED ORDER — KCL IN DEXTROSE-NACL 20-5-0.9 MEQ/L-%-% IV SOLN
INTRAVENOUS | Status: DC
  Filled 2014-12-11 (×2): qty 1000

## 2014-12-11 NOTE — Progress Notes (Signed)
Dressing change done on midline abdomen JP drain. VS stable. Will continue to monitor pt closely.

## 2014-12-11 NOTE — Consult Note (Signed)
WOC wound consult note Reason for Consult: Assisted bedside nurse to change Vac dressing to abd post-op wound. CCS following for assessment and plan of care. Appearance unchanged form previous assessment; 90% red, 10% yellow.  Exposed sutures in wound bed Drainage (amount, consistency, odor) Scant amt yellow drainage in cannister, no odor. Periwound: Intact skin surrounding. Dressing procedure/placement/frequency: Applied one piece black foam to midline abd and 16mm cont suction. Pt tolerated with mod amt discomfort. Plan for bedside nurses to change on M/W/F. Ostomy pouch intact with good seal, supplies at bedside for staff nurse use. Julien Girt MSN, RN, [place], [place], [place]

## 2014-12-11 NOTE — Progress Notes (Signed)
TEAM 1 - Stepdown/ICU TEAM Progress Note  Vincent Black GMW:[id number] DOB: 13-Apr-1971 DOA: 12/04/2014 PCP: No PCP Per Patient  Admit HPI / Brief Narrative: 44 y.o. M with Crohn's and C diff brought to [facility] 4/25 for worsening abdominal pain. Found to have perforated sigmoid colon, perforated cecal toxic megacolon, and small bowel fistula. He was taken to the OR for ex lap and small bowel resection with anastomosis, total abdominal colectomy with end ileostomy. Due to significant stool contamination, he was transferred to the ICU post op for close monitoring.  Significant Events: 4/25: Admitted for severe abd pain. Chest x-ray noted extensive free intraperitoneal air.  Underwent ex lap with SB resection and total abdominal colectomy with end ileostomy. 4/26: Poor uop despite increased IVF and 4L in boluses, shock pressors 4/27: Improved LA, with increased output urine. Off Neo 5/2:  Transfer to medical bed  HPI/Subjective: Patient is much more alert and interactive today.  He is oriented.  He complains of feeling severely weak in general but states his pain is well controlled.  He denies chest pain shortness of breath or nausea/vomiting.  Assessment/Plan:  C diff toxic megacolon with perforation sigmoid colon w/ septic shock s/p total colectomy with end ileostomy, small bowel resection with anastomosis Ongoing care of wounds and ostomy and nutrition per general surgery - ongoing care of antibiotic dosing per infectious disease (plan is for 2 weeks of broad antibiotics post colectomy followed by 2 additional weeks of oral Flagyl for his C. difficile)  Acute renal failure Renal function continues to improve - suspect it will normalize within the next 72 hours  Sinus tachycardia Appeared to be physiologic - resolved  Hypokalemia Due to poor intake as well as IV bicarbonate - resolved  Hyponatremia due to a combination of hypovolemia as well as administration of hypotonic IV  fluid - resolved with adjustment in IV fluid and slowly improving diet  Anemia - chronic and in setting of acute blood loss post op Transfuse as needed to keep hemoglobin greater than 6.9 - hemoglobin slowly recovering  Thrombocytosis - chronic Stable  H/o Crohn's  Severe malnutrition in the context of acute illness Diet per general surgery  Code Status: FULL Family Communication: Spoke with wife at bedside  Disposition Plan: Transfer to medical bed - ultimate disposition will hopefully be CIR  Consultants: General surgery Pulmonary/critical care GI - Dr. Benson [place] Infectious Disease  Antibiotics: Zosyn 4/25 > Flagyl 4/26 > Fluconazole 4/27 >  DVT prophylaxis: Subcutaneous heparin  Objective: Blood pressure 131/97, pulse 99, temperature 99 F (37.2 C), temperature source Axillary, resp. rate 16, height 5\' 9"  (1.753 m), weight 77.2 kg (170 lb 3.1 oz), SpO2 97 %.  Intake/Output Summary (Last 24 hours) at 12/11/14 1531 Last data filed at 12/11/14 0700  Gross per 24 hour  Intake 1488.33 ml  Output    705 ml  Net 783.33 ml   Exam: General: No acute respiratory distress - alert and conversant Lungs: Clear to auscultation bilaterally without wheezes or crackles Cardiovascular: Regular rate and rhythm without murmur gallop rub Abdomen: Nondistended, soft, no ascites, no appreciable mass Extremities: No significant cyanosis, clubbing, edema bilateral lower extremities  Data Reviewed: Basic Metabolic Panel:  Recent Labs Lab 12/05/14 1657 12/06/14 0311 12/06/14 1830 12/07/14 0746 12/07/14 1800 12/09/14 0418 12/10/14 0445 12/11/14 0527  NA 132* 133* 132* 134* 131* 135 137 139  K 3.6 3.4* 3.8 3.5 3.3* 3.0* 3.6 3.7  CL 104 107 107 105 105 105 108 111  CO2 19 19 16* 18* 19 22 22 23   GLUCOSE 145* 120* 123* 82 86 69* 47* 128*  BUN 16 21 27* 28* 29* 27* 22* 22*  CREATININE 1.82* 2.16* 2.49* 2.73* 2.40* 2.34* 2.03* 1.82*  CALCIUM 6.3* 6.2* 6.8* 6.8* 6.7* 6.8* 6.8* 6.9*    MG 1.6 1.5 2.6* 2.3 2.3 2.2  --   --   PHOS 3.2  --  4.2 4.1 3.9 3.2  --   --     Liver Function Tests:  Recent Labs Lab 12/04/14 1625 12/06/14 0311 12/07/14 0445 12/08/14 0703 12/09/14 0418  AST 26 18 16 18 20   ALT 8 9 11 10 10   ALKPHOS 111 154* 78 92 109  BILITOT 0.9 0.5 0.6 0.5 0.6  PROT 5.9* 3.6* 3.7* 3.8* 3.9*  ALBUMIN 1.8* <1.0* <1.0* <1.0* <1.0*    Coags:  Recent Labs Lab 12/04/14 1625  INR 1.52*    Recent Labs Lab 12/04/14 1625  APTT 35    CBC:  Recent Labs Lab 12/06/14 0311 12/06/14 1830 12/07/14 0746 12/07/14 1800 12/08/14 0800 12/09/14 0418 12/10/14 0445  WBC 42.6* 60.9* 48.7* 43.2* 35.6* 26.7* 38.2*  NEUTROABS 39.6* 57.9* 45.7* 38.9* 32.4*  --   --   HGB 7.4* 8.1* 7.1* 7.0* 7.2* 7.0* 7.5*  HCT 23.0* 25.8* 22.1* 21.7* 22.5* 22.2* 23.9*  MCV 86.1 87.5 85.7 86.5 85.2 86.0 86.3  PLT 222 280 248 245 276 265 321   CBG:  Recent Labs Lab 12/10/14 2032 12/11/14 0034 12/11/14 0427 12/11/14 0855 12/11/14 1257  GLUCAP 105* 130* 109* 122* 130*    Recent Results (from the past 240 hour(s))  Culture, blood (x 2)     Status: None   Collection Time: 12/04/14  4:50 PM  Result Value Ref Range Status   Specimen Description BLOOD LEFT HAND  Final   Special Requests BOTTLES DRAWN AEROBIC ONLY 2CC  Final   Culture   Final    NO GROWTH 5 DAYS Performed at [facility]    Report Status 12/11/2014 FINAL  Final  Culture, blood (x 2)     Status: None   Collection Time: 12/04/14  4:50 PM  Result Value Ref Range Status   Specimen Description BLOOD RIGHT HAND  Final   Special Requests BOTTLES DRAWN AEROBIC ONLY 5CC  Final   Culture   Final    NO GROWTH 5 DAYS Performed at [facility]    Report Status 12/11/2014 FINAL  Final  MRSA PCR Screening     Status: None   Collection Time: 12/04/14  5:32 PM  Result Value Ref Range Status   MRSA by PCR NEGATIVE NEGATIVE Final    Comment:        The GeneXpert MRSA Assay (FDA approved for  NASAL specimens only), is one component of a comprehensive MRSA colonization surveillance program. It is not intended to diagnose MRSA infection nor to guide or monitor treatment for MRSA infections.   Wound culture     Status: None   Collection Time: 12/04/14  7:10 PM  Result Value Ref Range Status   Specimen Description ABDOMEN  Final   Special Requests PATIENT ON FOLLOWING ZINACEF  Final   Gram Stain   Final    FEW WBC PRESENT,BOTH PMN AND MONONUCLEAR NO SQUAMOUS EPITHELIAL CELLS SEEN NO ORGANISMS SEEN Performed at [facility]    Culture   Final    FEW CANDIDA ALBICANS Performed at [facility]    Report Status 12/07/2014  FINAL  Final  Anaerobic culture     Status: None   Collection Time: 12/04/14  7:10 PM  Result Value Ref Range Status   Specimen Description WOUND ABDOMEN  Final   Special Requests PT ON ZINACEF  Final   Gram Stain   Final    FEW WBC PRESENT,BOTH PMN AND MONONUCLEAR NO SQUAMOUS EPITHELIAL CELLS SEEN NO ORGANISMS SEEN Performed at [facility]    Culture   Final    NO ANAEROBES ISOLATED Performed at [facility]    Report Status 12/09/2014 FINAL  Final     Studies:   Recent x-ray studies have been reviewed in detail by the Attending Physician  Scheduled Meds:  Scheduled Meds: . acetaminophen  1,000 mg Oral TID  . antiseptic oral rinse  7 mL Mouth Rinse q12n4p  . chlorhexidine  15 mL Mouth Rinse BID  . dextrose  25 mL Intravenous Once  . feeding supplement (ENSURE ENLIVE)  237 mL Oral BID BM  . feeding supplement (PRO-STAT SUGAR FREE 64)  30 mL Oral BID WC  . fluconazole (DIFLUCAN) IV  200 mg Intravenous Q24H  . heparin subcutaneous  5,000 Units Subcutaneous 3 times per day  . lip balm  1 application Topical BID  . metoprolol tartrate  12.5 mg Oral BID  . mirtazapine  15 mg Oral QHS  . morphine  15 mg Oral Q12H  . piperacillin-tazobactam (ZOSYN)  IV  3.375 g Intravenous Q8H  . prednisoLONE acetate  1  drop Both Eyes BID  . saccharomyces boulardii  250 mg Oral BID    Time spent on care of this patient: 35 mins  Jacai Kipp T , MD   [facility] Office  [phone] Pager - Text Page per Shea Evans as per below:  On-Call/Text Page:      [email or web address]      password [email or web address]  If 7PM-7AM, please contact night-coverage [email or web address] Password [email or web address] 12/11/2014, 3:31 PM   LOS: 7 days

## 2014-12-11 NOTE — Progress Notes (Signed)
7 Days Post-Op  Subjective: Appears comfortable Not really wanting to talk to me  Objective: Vital signs in last 24 hours: Temp:  [97.9 F (36.6 C)-98.4 F (36.9 C)] 97.9 F (36.6 C) (05/02 0430) Pulse Rate:  [85-109] 85 (05/02 0430) Resp:  [10-28] 10 (05/02 0430) BP: (116-128)/(87-100) 116/91 mmHg (05/02 0430) SpO2:  [95 %-98 %] 96 % (05/02 0430) Last BM Date: 12/10/14  Intake/Output from previous day: 05/01 0701 - 05/02 0700 In: 2138.3 [P.O.:300; I.V.:1688.3; IV Piggyback:150] Out: 1266 [Urine:1100; Drains:16; Stool:150] Intake/Output this shift:    Abdomen soft Wound VAC in place Ileostomy viable  Lab Results:   Recent Labs  12/09/14 0418 12/10/14 0445  WBC 26.7* 38.2*  HGB 7.0* 7.5*  HCT 22.2* 23.9*  PLT 265 321   BMET  Recent Labs  12/10/14 0445 12/11/14 0527  NA 137 139  K 3.6 3.7  CL 108 111  CO2 22 23  GLUCOSE 47* 128*  BUN 22* 22*  CREATININE 2.03* 1.82*  CALCIUM 6.8* 6.9*   PT/INR No results for input(s): LABPROT, INR in the last 72 hours. ABG No results for input(s): PHART, HCO3 in the last 72 hours.  Invalid input(s): PCO2, PO2  Studies/Results: No results found.  Anti-infectives: Anti-infectives    Start     Dose/Rate Route Frequency Ordered Stop   12/07/14 1300  fluconazole (DIFLUCAN) IVPB 200 mg     200 mg 100 mL/hr over 60 Minutes Intravenous Every 24 hours 12/07/14 1102     12/06/14 1300  fluconazole (DIFLUCAN) IVPB 400 mg  Status:  Discontinued     400 mg 100 mL/hr over 120 Minutes Intravenous Every 24 hours 12/06/14 1119 12/07/14 1102   12/06/14 0000  micafungin (MYCAMINE) 100 mg in sodium chloride 0.9 % 100 mL IVPB  Status:  Discontinued     100 mg 100 mL/hr over 1 Hours Intravenous Daily 12/05/14 1108 12/05/14 1332   12/05/14 1500  micafungin (MYCAMINE) 100 mg in sodium chloride 0.9 % 100 mL IVPB  Status:  Discontinued     100 mg 100 mL/hr over 1 Hours Intravenous Daily 12/05/14 1332 12/06/14 1045   12/05/14 1200   metroNIDAZOLE (FLAGYL) IVPB 500 mg  Status:  Discontinued     500 mg 100 mL/hr over 60 Minutes Intravenous Every 8 hours 12/05/14 1108 12/08/14 0805   12/05/14 1000  micafungin (MYCAMINE) 100 mg in sodium chloride 0.9 % 100 mL IVPB  Status:  Discontinued     100 mg 100 mL/hr over 1 Hours Intravenous Daily 12/04/14 2255 12/04/14 2343   12/05/14 0000  fluconazole (DIFLUCAN) IVPB 200 mg  Status:  Discontinued     200 mg 100 mL/hr over 60 Minutes Intravenous Every 24 hours 12/04/14 2259 12/05/14 1107   12/05/14 0000  vancomycin (VANCOCIN) IVPB 1000 mg/200 mL premix  Status:  Discontinued     1,000 mg 200 mL/hr over 60 Minutes Intravenous Every 8 hours 12/04/14 2356 12/06/14 0850   12/04/14 1515  piperacillin-tazobactam (ZOSYN) IVPB 3.375 g     3.375 g 12.5 mL/hr over 240 Minutes Intravenous Every 8 hours 12/04/14 1510     12/04/14 1415  vancomycin (VANCOCIN) 50 mg/mL oral solution 125 mg  Status:  Discontinued     125 mg Oral 4 times per day 12/04/14 1402 12/04/14 2346   12/04/14 1415  ciprofloxacin (CIPRO) IVPB 400 mg  Status:  Discontinued     400 mg 200 mL/hr over 60 Minutes Intravenous Every 12 hours 12/04/14 1402 12/04/14 1504  Assessment/Plan: s/p Procedure(s): EXPLORATORY LAPAROTOMY, Total Colectomy (N/Black)  Continue wound VAC, antibiotics Encourage po PT consult  LOS: 7 days    Vincent Black 12/11/2014

## 2014-12-11 NOTE — Progress Notes (Signed)
INFECTIOUS DISEASE PROGRESS NOTE  ID: Vincent Black is a 44 y.o. male with  Principal Problem:   Abdominal pain Active Problems:   Crohn's disease   History of Clostridium difficile colitis   Protein-calorie malnutrition, severe   Unintentional weight loss   Enteritis due to Clostridium difficile   Encounter for nasogastric (NG) tube placement   Fever   Perforation of sigmoid colon   C. difficile colitis   Septic shock   Toxic megacolon   Small bowel fistula   Sinus tachycardia   Hypomagnesemia   Metabolic acidosis   Anemia of chronic disease   Hyperglycemia   Leukocytosis   Chronic pain syndrome   Anxiety state  Subjective: C/o pain Per his family much better, decreased pain. Up in chair 3 h yesterday.  Stool is semi-formed in pouch.   Abtx:  Anti-infectives    Start     Dose/Rate Route Frequency Ordered Stop   12/07/14 1300  fluconazole (DIFLUCAN) IVPB 200 mg     200 mg 100 mL/hr over 60 Minutes Intravenous Every 24 hours 12/07/14 1102     12/06/14 1300  fluconazole (DIFLUCAN) IVPB 400 mg  Status:  Discontinued     400 mg 100 mL/hr over 120 Minutes Intravenous Every 24 hours 12/06/14 1119 12/07/14 1102   12/06/14 0000  micafungin (MYCAMINE) 100 mg in sodium chloride 0.9 % 100 mL IVPB  Status:  Discontinued     100 mg 100 mL/hr over 1 Hours Intravenous Daily 12/05/14 1108 12/05/14 1332   12/05/14 1500  micafungin (MYCAMINE) 100 mg in sodium chloride 0.9 % 100 mL IVPB  Status:  Discontinued     100 mg 100 mL/hr over 1 Hours Intravenous Daily 12/05/14 1332 12/06/14 1045   12/05/14 1200  metroNIDAZOLE (FLAGYL) IVPB 500 mg  Status:  Discontinued     500 mg 100 mL/hr over 60 Minutes Intravenous Every 8 hours 12/05/14 1108 12/08/14 0805   12/05/14 1000  micafungin (MYCAMINE) 100 mg in sodium chloride 0.9 % 100 mL IVPB  Status:  Discontinued     100 mg 100 mL/hr over 1 Hours Intravenous Daily 12/04/14 2255 12/04/14 2343   12/05/14 0000  fluconazole (DIFLUCAN) IVPB  200 mg  Status:  Discontinued     200 mg 100 mL/hr over 60 Minutes Intravenous Every 24 hours 12/04/14 2259 12/05/14 1107   12/05/14 0000  vancomycin (VANCOCIN) IVPB 1000 mg/200 mL premix  Status:  Discontinued     1,000 mg 200 mL/hr over 60 Minutes Intravenous Every 8 hours 12/04/14 2356 12/06/14 0850   12/04/14 1515  piperacillin-tazobactam (ZOSYN) IVPB 3.375 g     3.375 g 12.5 mL/hr over 240 Minutes Intravenous Every 8 hours 12/04/14 1510     12/04/14 1415  vancomycin (VANCOCIN) 50 mg/mL oral solution 125 mg  Status:  Discontinued     125 mg Oral 4 times per day 12/04/14 1402 12/04/14 2346   12/04/14 1415  ciprofloxacin (CIPRO) IVPB 400 mg  Status:  Discontinued     400 mg 200 mL/hr over 60 Minutes Intravenous Every 12 hours 12/04/14 1402 12/04/14 1504      Medications:  Scheduled: . acetaminophen  1,000 mg Oral TID  . antiseptic oral rinse  7 mL Mouth Rinse q12n4p  . chlorhexidine  15 mL Mouth Rinse BID  . dextrose  25 mL Intravenous Once  . feeding supplement (ENSURE ENLIVE)  237 mL Oral BID BM  . feeding supplement (PRO-STAT SUGAR FREE 64)  30 mL Oral  BID WC  . fluconazole (DIFLUCAN) IV  200 mg Intravenous Q24H  . heparin subcutaneous  5,000 Units Subcutaneous 3 times per day  . lip balm  1 application Topical BID  . metoprolol tartrate  12.5 mg Oral BID  . mirtazapine  15 mg Oral QHS  . morphine  15 mg Oral Q12H  . piperacillin-tazobactam (ZOSYN)  IV  3.375 g Intravenous Q8H  . prednisoLONE acetate  1 drop Both Eyes BID  . saccharomyces boulardii  250 mg Oral BID    Objective: Vital signs in last 24 hours: Temp:  [97.6 F (36.4 C)-98.4 F (36.9 C)] 97.6 F (36.4 C) (05/02 0857) Pulse Rate:  [85-109] 92 (05/02 1013) Resp:  [10-28] 18 (05/02 0857) BP: (116-128)/(87-100) 122/95 mmHg (05/02 1013) SpO2:  [95 %-98 %] 96 % (05/02 0430)   General appearance: alert, cooperative and mild distress Resp: clear to auscultation bilaterally Cardio: regular rate and  rhythm GI: normal findings: bowel sounds normal and soft, non-tender and wound is clean.   Lab Results  Recent Labs  12/09/14 0418 12/10/14 0445 12/11/14 0527  WBC 26.7* 38.2*  --   HGB 7.0* 7.5*  --   HCT 22.2* 23.9*  --   NA 135 137 139  K 3.0* 3.6 3.7  CL 105 108 111  CO2 22 22 23   BUN 27* 22* 22*  CREATININE 2.34* 2.03* 1.82*   Liver Panel  Recent Labs  12/09/14 0418  PROT 3.9*  ALBUMIN <1.0*  AST 20  ALT 10  ALKPHOS 109  BILITOT 0.6   Sedimentation Rate No results for input(s): ESRSEDRATE in the last 72 hours. C-Reactive Protein No results for input(s): CRP in the last 72 hours.  Microbiology: Recent Results (from the past 240 hour(s))  Culture, blood (x 2)     Status: None   Collection Time: 12/04/14  4:50 PM  Result Value Ref Range Status   Specimen Description BLOOD LEFT HAND  Final   Special Requests BOTTLES DRAWN AEROBIC ONLY [place]  Final   Culture   Final    NO GROWTH 5 DAYS Performed at [facility]    Report Status 12/11/2014 FINAL  Final  Culture, blood (x 2)     Status: None   Collection Time: 12/04/14  4:50 PM  Result Value Ref Range Status   Specimen Description BLOOD RIGHT HAND  Final   Special Requests BOTTLES DRAWN AEROBIC ONLY 5CC  Final   Culture   Final    NO GROWTH 5 DAYS Performed at [facility]    Report Status 12/11/2014 FINAL  Final  MRSA PCR Screening     Status: None   Collection Time: 12/04/14  5:32 PM  Result Value Ref Range Status   MRSA by PCR NEGATIVE NEGATIVE Final    Comment:        The GeneXpert MRSA Assay (FDA approved for NASAL specimens only), is one component of a comprehensive MRSA colonization surveillance program. It is not intended to diagnose MRSA infection nor to guide or monitor treatment for MRSA infections.   Wound culture     Status: None   Collection Time: 12/04/14  7:10 PM  Result Value Ref Range Status   Specimen Description ABDOMEN  Final   Special Requests PATIENT ON  FOLLOWING ZINACEF  Final   Gram Stain   Final    FEW WBC PRESENT,BOTH PMN AND MONONUCLEAR NO SQUAMOUS EPITHELIAL CELLS SEEN NO ORGANISMS SEEN Performed at [facility]  Culture   Final    FEW CANDIDA ALBICANS Performed at [facility]    Report Status 12/07/2014 FINAL  Final  Anaerobic culture     Status: None   Collection Time: 12/04/14  7:10 PM  Result Value Ref Range Status   Specimen Description WOUND ABDOMEN  Final   Special Requests PT ON ZINACEF  Final   Gram Stain   Final    FEW WBC PRESENT,BOTH PMN AND MONONUCLEAR NO SQUAMOUS EPITHELIAL CELLS SEEN NO ORGANISMS SEEN Performed at [facility]    Culture   Final    NO ANAEROBES ISOLATED Performed at [facility]    Report Status 12/09/2014 FINAL  Final    Studies/Results: No results found.   Assessment/Plan: Crohn's  C diff Perforated sigmoid, SB fistula, toxic megacolon Candida peritonitis Total colectomy, ileostomy (4-25) ARF Severe protein calorie malnutrition  Total days of antibiotics: 7 C diff therapy: day 7 (po vanco --> flagyl) Zosyn 4/25 > Flagyl 4/26 > Fluconazole 4/27 > Received mycafungin prior  Would aim for him to receive 2 weeks of either po flagyl or po vanco after he completes therapy for his peritonitis Would aim for 2 weeks of anbx post his colectomy for treatment of peritonitis.  WBC is still quite high but improving.  Family updated.          Bobby Rumpf Infectious Diseases (pager) [phone] [email or web address] 12/11/2014, 10:42 AM  LOS: 7 days

## 2014-12-11 NOTE — Progress Notes (Signed)
Pt has been complaining of new onset of gas and increased pressure and pain in his abdomen where the wound vac and colostomy bag are located. Pain meds have been given to pt. NP notified of this change. Will continue to monitor pt closely.

## 2014-12-11 NOTE — Progress Notes (Signed)
Physical Therapy Treatment Patient Details Name: Vincent Black MRN: [id number] DOB: 1970-11-22 Today's Date: 12/11/2014    History of Present Illness Pt is 44 yo male with known history of Crohn's disease, several recent admissions in March 2016, discharged on March 1st, 2016 after being treated severe C. difficile colitis complicated by pericolonic abscess requiring drain placement on 10/27/14 and positive for Citrobacter and Klebsiella, most recent discharge on 3/30 after being treated for severe sepsis secondary to intra-abd abscess. Pt has seen Dr. Megan Black for management of ABX on 11/21/2014, was advised to continue taking antibiotics ciprofloxacin and oral vancomycin until repeat CT abdomen done which was scheduled be done on 12/05/2014. Last CT abdomen was done on 11/17/2014 and showed abscess cavity collapsed. Patient saw Dr. Benson Black his gastroenterologist earlier in a day for evaluation of sudden onset of hematemesis, generalized weakness, right upper quadrant area pain. Patient explains this started very suddenly, started to throw up massive amounts of gastric content with blood in it. 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    PT Comments    Pt admitted with above diagnosis. Pt currently with functional limitations due to balance and endurance deficits. Pt with weakness and pain limiting his progression. Will continue as pt and pain allows.   Pt will benefit from skilled PT to increase their independence and safety with mobility to allow discharge to the venue listed below.    Follow Up Recommendations  CIR;Supervision/Assistance - 24 hour     Equipment Recommendations  Rolling walker with 5" wheels    Recommendations for Other Services Rehab consult     Precautions / Restrictions Precautions Precautions: Fall Precaution Comments: JP drain Restrictions Weight Bearing Restrictions: No     Mobility  Bed Mobility Overal bed mobility: Needs Assistance Bed Mobility: Rolling;Sidelying to Sit Rolling: Min assist;+2 for physical assistance Sidelying to sit: Mod assist;+2 for physical assistance       General bed mobility comments: VC's for log roll technique, and mod assist to elevate trunk as well as use of pad to get pt to EOB.   Transfers Overall transfer level: Needs assistance Equipment used: 2 person hand held assist Transfers: Sit to/from [employer] Sit to Stand: Max assist;+2 physical assistance;From elevated surface   Squat pivot transfers: Mod assist;+2 physical assistance;From elevated surface     General transfer comment: +2 assist provided for safety and support as pt powered-up to only partial  stand with PT assisting with squat pivot transfer .  Pt with bil knee buckling with notable weakness.    Ambulation/Gait                 Stairs            Wheelchair Mobility    Modified Rankin (Stroke Patients Only)       Balance Overall balance assessment: Needs assistance;History of Falls Sitting-balance support: Bilateral upper extremity supported;Feet supported Sitting balance-Leahy Scale: Poor Sitting balance - Comments: Requires UE support bil to sit EOB x 3 min   Standing balance support: Bilateral upper extremity supported;During functional activity Standing balance-Leahy Scale: Zero Standing balance comment: Cannot stand fully upright and requires mod to max assist for partial stand.  Bil LE weakness limiting standing.                     Cognition Arousal/Alertness: Awake/alert Behavior During Therapy: WFL for tasks assessed/performed Overall Cognitive Status: Within  Functional Limits for tasks assessed                      Exercises General Exercises - Lower Extremity Long Arc Quad: AROM;Both;5 reps;Seated    General Comments General comments (skin integrity, edema, etc.): Wife present and  encouraged her to have pt perform LAQ when sitting in chair as well as hip flexion.  Also discussed LE exercises in bed such as heel slides and abduction.  Also pt to perform UE exercises with wife.        Pertinent Vitals/Pain Pain Assessment: Faces Faces Pain Scale: Hurts whole lot Pain Location: abdomen Pain Descriptors / Indicators: Aching;Operative site guarding Pain Intervention(s): Limited activity within patient's tolerance;Monitored during session;Repositioned;Premedicated before session  VSS    Home Living                      Prior Function            PT Goals (current goals can now be found in the care plan section) Progress towards PT goals: Not progressing toward goals - comment (pain is limiting pt progression)    Frequency  Min 3X/week    PT Plan Current plan remains appropriate    Co-evaluation             End of Session Equipment Utilized During Treatment: Gait belt Activity Tolerance: Patient limited by fatigue;Patient limited by pain Patient left: in chair;with call bell/phone within reach;with family/visitor present     Time: 6015-6153 PT Time Calculation (min) (ACUTE ONLY): 18 min  Charges:  $Therapeutic Activity: 8-22 mins                    G CodesIrwin Brakeman F 12-24-2014, 1:39 PM [employer] [facility] [phone] (pager)

## 2014-12-11 NOTE — Progress Notes (Signed)
Surgical MD notified regarding pt's new onset of gas/pain/pressure in abdomen area. Orders received to discontinue transfer order and pt will remain on SDU for tonight. Will continue to monitor pt closely.

## 2014-12-12 ENCOUNTER — Inpatient Hospital Stay (HOSPITAL_COMMUNITY): Admission: RE | Admit: 2014-12-12 | Payer: 59 | Source: Ambulatory Visit

## 2014-12-12 ENCOUNTER — Other Ambulatory Visit (HOSPITAL_COMMUNITY): Payer: 59

## 2014-12-12 DIAGNOSIS — K659 Peritonitis, unspecified: Secondary | ICD-10-CM

## 2014-12-12 DIAGNOSIS — D638 Anemia in other chronic diseases classified elsewhere: Secondary | ICD-10-CM | POA: Diagnosis present

## 2014-12-12 DIAGNOSIS — R5381 Other malaise: Secondary | ICD-10-CM

## 2014-12-12 DIAGNOSIS — K631 Perforation of intestine (nontraumatic): Secondary | ICD-10-CM | POA: Diagnosis present

## 2014-12-12 DIAGNOSIS — K5090 Crohn's disease, unspecified, without complications: Secondary | ICD-10-CM

## 2014-12-12 DIAGNOSIS — R1084 Generalized abdominal pain: Secondary | ICD-10-CM

## 2014-12-12 DIAGNOSIS — B3789 Other sites of candidiasis: Secondary | ICD-10-CM

## 2014-12-12 DIAGNOSIS — B9689 Other specified bacterial agents as the cause of diseases classified elsewhere: Secondary | ICD-10-CM

## 2014-12-12 LAB — BASIC METABOLIC PANEL
Anion gap: 8 (ref 5–15)
BUN: 19 mg/dL (ref 6–20)
CHLORIDE: 111 mmol/L (ref 101–111)
CO2: 20 mmol/L — AB (ref 22–32)
Calcium: 6.9 mg/dL — ABNORMAL LOW (ref 8.9–10.3)
Creatinine, Ser: 1.68 mg/dL — ABNORMAL HIGH (ref 0.61–1.24)
GFR calc Af Amer: 56 mL/min — ABNORMAL LOW (ref >60)
GFR, EST NON AFRICAN AMERICAN: 49 mL/min — AB (ref >60)
Glucose, Bld: 111 mg/dL — ABNORMAL HIGH (ref 70–99)
Potassium: 4.2 mmol/L (ref 3.5–5.1)
SODIUM: 139 mmol/L (ref 135–145)

## 2014-12-12 LAB — GLUCOSE, CAPILLARY
GLUCOSE-CAPILLARY: 108 mg/dL — AB (ref 70–99)
Glucose-Capillary: 114 mg/dL — ABNORMAL HIGH (ref 70–99)
Glucose-Capillary: 96 mg/dL (ref 70–99)
Glucose-Capillary: 97 mg/dL (ref 70–99)

## 2014-12-12 LAB — CLOSTRIDIUM DIFFICILE BY PCR: Toxigenic C. Difficile by PCR: POSITIVE — AB

## 2014-12-12 MED ORDER — SODIUM CHLORIDE 0.9 % IJ SOLN
10.0000 mL | INTRAMUSCULAR | Status: DC | PRN
  Administered 2014-12-14 – 2014-12-19 (×5): 10 mL
  Filled 2014-12-12 (×5): qty 40

## 2014-12-12 MED ORDER — FLUCONAZOLE IN SODIUM CHLORIDE 400-0.9 MG/200ML-% IV SOLN
400.0000 mg | INTRAVENOUS | Status: DC
  Administered 2014-12-12 – 2014-12-18 (×7): 400 mg via INTRAVENOUS
  Filled 2014-12-12 (×8): qty 200

## 2014-12-12 MED ORDER — METRONIDAZOLE 500 MG PO TABS
500.0000 mg | ORAL_TABLET | Freq: Three times a day (TID) | ORAL | Status: DC
  Administered 2014-12-12 – 2014-12-14 (×6): 500 mg via ORAL
  Filled 2014-12-12 (×10): qty 1

## 2014-12-12 MED ORDER — METHOCARBAMOL 500 MG PO TABS
500.0000 mg | ORAL_TABLET | Freq: Three times a day (TID) | ORAL | Status: DC
  Administered 2014-12-12 – 2014-12-20 (×26): 500 mg via ORAL
  Filled 2014-12-12 (×28): qty 1

## 2014-12-12 MED ORDER — HYDROCODONE-ACETAMINOPHEN 7.5-325 MG PO TABS
1.0000 | ORAL_TABLET | ORAL | Status: DC | PRN
  Administered 2014-12-12 – 2014-12-13 (×4): 1 via ORAL
  Filled 2014-12-12 (×4): qty 1

## 2014-12-12 NOTE — Progress Notes (Signed)
[facility] TEAM 1 - Stepdown/ICU TEAM Progress Note  SHERMAINE BRIGHAM MEQ:[id number] DOB: 10-18-1970 DOA: 12/04/2014 PCP: No PCP Per Patient  Admit HPI / Brief Narrative: 44 y.o. WM PMHx anxiety, ankylosing spondylitis, Crohn's and C.diff brought to [facility] 4/25 for worsening abdominal pain. Found to have perforated sigmoid colon, perforated cecal toxic megacolon, and small bowel fistula. He was taken to the OR for ex lap and small bowel resection with anastomosis, total abdominal colectomy with end ileostomy. Due to significant stool contamination, he was transferred to the ICU post op for close monitoring overnight.  HPI/Subjective: 5/3 A/O 4, states abdominal pain increased yesterday secondary to wound VAC changed and having to sit in the chair > 4 hours. Some nausea, negative vomiting  Assessment/Plan: Perforated sigmoid colon, perforated cecal toxic megacolon, and small bowel fistula  - s/p ex lap and SBR with anastomosis, total abdominal colectomy with end ileostomy (4/25 - Dr. Donne Hazel)  Crohn's Dz  C.diff  colitis  -Given pre op nutritional status would favor TPN  starting on 4/28 -Per infectious disease Would aim for 2 weeks of zosyn/flucon/flagyl -Repeat C diff pending; if negative, ok to come out of isolation if ok with IP.  -Would not stop his po flagyl if negative. Then continue flagyl po for 2 weeks after this (if he is unable to tolerate, change to po vanco)  Septic shock / secondary perforated sigmoid colon, perforated cecal toxic megacolon, and small bowel fistula  - s/p ex lap and SBR with anastomosis, total abdominal colectomy with end ileostomy (4/25 - Dr. Donne Hazel) -due to perforated sigmoid colon, perforated cecal toxic megacolon, and small bowel fistula - s/p ex lap and SBR with anastomosis, total abdominal colectomy with end ileostomy (4/25 - Dr. Donne Hazel)  Sinus tachycardia -Multifactorial to include hypovolemia + pain -D5-Normal saline 100 ml/hr  -Goal MAP >  60  Decreased uop -Secondary dehydration -Strict in and out; since admission + 10.9 L  Anemia - chronic -Transfuse for Hgb < 7.  Thrombocytosis - chronic  Leukocytosis CBC in AM in with diff  Acute on chronic pain uncontrolled  -Restart Robaxin (patient was on 1000 mg TID as outpatient). Robaxin 500 mg TID -Continue MS Contin 15 mg BID -Start Norco 7.5-325 mg q 4hr PRN  Anxiety -PRN  Ativan    Code Status: FULL Family Communication: no family present at time of exam Disposition Plan: Surgery    Consultants: Dr.Robert W Comer (ID) Dr Donnie Mesa (surgery). Dr.Patrick Benson [place] (GI) Dr.Daniel Lily Kocher [facility] M)   Procedure/Significant Events: CXR 4/25 >>> extensive free intraperitoneal air.  AXR 4/25 >>> large pneumoperitoneum c/w bowel perforation. Small bowel and colonic distention, LLQ percutaneous abscess drain. 4/26: Poor uop despite increased IVF and 4L in boluses, shock pressors 4/27: Improved LA, with increased output urine. Off Neo   Culture 4/25 blood left/right hand negative  4/25 MRSA by PCR negative 4/25 Wound positive Candida albicans 5/3 C. difficile pending   Antibiotics: Total days of antibiotics: 8 C diff therapy: day 7 (po vanco --> flagyl) Zosyn 4/25 > complete 2 week course per ID Flagyl 4/26 > complete 4 week course per ID Fluconazole 4/27 > complete 2 week course per ID Received mycafungin prior   DVT prophylaxis: SCD's / Heparin.   Devices    LINES / TUBES:  CVL R IJ 4/25 >>> R radial A line 4/25 >>>     Continuous Infusions: . dextrose 5 % and 0.9% NaCl 1,000 mL with potassium chloride 20 mEq infusion  100 mL/hr at 12/12/14 0800    Objective: VITAL SIGNS: Temp: 98 F (36.7 C) (05/03 0834) Temp Source: Oral (05/03 0834) BP: 149/93 mmHg (05/03 1000) Pulse Rate: 102 (05/03 1000) SPO2; FIO2:   Intake/Output Summary (Last 24 hours) at 12/12/14 1152 Last data filed at 12/12/14 1000  Gross per 24 hour  Intake    2650 ml  Output   1325 ml  Net   1325 ml     Exam: General:  A/O 4, pain only partially controlled, left-sided JP drain in place,No acute respiratory distress Lungs: Clear to auscultation bilaterally without wheezes or crackles Cardiovascular:  Tachycardic,Regular rhythm without murmur gallop or rub normal S1 and S2 Abdomen:  appropriatelytender, nondistended, soft,  positive bowel sounds,no rebound, no ascites, no appreciable mass, JP drain in place on the left draining serosanguineous fluid, midline vertical incision with wound VAC in place, colostomy bag in place RLL.  Extremities: No significant cyanosis, clubbing, or edema bilateral lower extremities  Data Reviewed: Basic Metabolic Panel:  Recent Labs Lab 12/05/14 1657 12/06/14 0311 12/06/14 1830 12/07/14 0746 12/07/14 1800 12/09/14 0418 12/10/14 0445 12/11/14 0527 12/12/14 0356  NA 132* 133* 132* 134* 131* 135 137 139 139  K 3.6 3.4* 3.8 3.5 3.3* 3.0* 3.6 3.7 4.2  CL 104 107 107 105 105 105 108 111 111  CO2 19 19 16* 18* 19 22 22 23  20*  GLUCOSE 145* 120* 123* 82 86 69* 47* 128* 111*  BUN 16 21 27* 28* 29* 27* 22* 22* 19  CREATININE 1.82* 2.16* 2.49* 2.73* 2.40* 2.34* 2.03* 1.82* 1.68*  CALCIUM 6.3* 6.2* 6.8* 6.8* 6.7* 6.8* 6.8* 6.9* 6.9*  MG 1.6 1.5 2.6* 2.3 2.3 2.2  --   --   --   PHOS 3.2  --  4.2 4.1 3.9 3.2  --   --   --    Liver Function Tests:  Recent Labs Lab 12/06/14 0311 12/07/14 0445 12/08/14 0703 12/09/14 0418  AST 18 16 18 20   ALT 9 11 10 10   ALKPHOS 154* 78 92 109  BILITOT 0.5 0.6 0.5 0.6  PROT 3.6* 3.7* 3.8* 3.9*  ALBUMIN <1.0* <1.0* <1.0* <1.0*   No results for input(s): LIPASE, AMYLASE in the last 168 hours. No results for input(s): AMMONIA in the last 168 hours. CBC:  Recent Labs Lab 12/06/14 0311 12/06/14 1830 12/07/14 0746 12/07/14 1800 12/08/14 0800 12/09/14 0418 12/10/14 0445  WBC 42.6* 60.9* 48.7* 43.2* 35.6* 26.7* 38.2*  NEUTROABS 39.6* 57.9* 45.7* 38.9* 32.4*  --   --    HGB 7.4* 8.1* 7.1* 7.0* 7.2* 7.0* 7.5*  HCT 23.0* 25.8* 22.1* 21.7* 22.5* 22.2* 23.9*  MCV 86.1 87.5 85.7 86.5 85.2 86.0 86.3  PLT 222 280 248 245 276 265 321   Cardiac Enzymes: No results for input(s): CKTOTAL, CKMB, CKMBINDEX, TROPONINI in the last 168 hours. BNP (last 3 results) No results for input(s): BNP in the last 8760 hours.  ProBNP (last 3 results) No results for input(s): PROBNP in the last 8760 hours.  CBG:  Recent Labs Lab 12/11/14 0855 12/11/14 1257 12/11/14 1714 12/11/14 2045 12/12/14 0837  GLUCAP 122* 130* 103* 108* 114*    Recent Results (from the past 240 hour(s))  Culture, blood (x 2)     Status: None   Collection Time: 12/04/14  4:50 PM  Result Value Ref Range Status   Specimen Description BLOOD LEFT HAND  Final   Special Requests BOTTLES DRAWN AEROBIC ONLY Mucarabones  Final   Culture  Final    NO GROWTH 5 DAYS Performed at [facility]    Report Status 12/11/2014 FINAL  Final  Culture, blood (x 2)     Status: None   Collection Time: 12/04/14  4:50 PM  Result Value Ref Range Status   Specimen Description BLOOD RIGHT HAND  Final   Special Requests BOTTLES DRAWN AEROBIC ONLY 5CC  Final   Culture   Final    NO GROWTH 5 DAYS Performed at [facility]    Report Status 12/11/2014 FINAL  Final  MRSA PCR Screening     Status: None   Collection Time: 12/04/14  5:32 PM  Result Value Ref Range Status   MRSA by PCR NEGATIVE NEGATIVE Final    Comment:        The GeneXpert MRSA Assay (FDA approved for NASAL specimens only), is one component of a comprehensive MRSA colonization surveillance program. It is not intended to diagnose MRSA infection nor to guide or monitor treatment for MRSA infections.   Wound culture     Status: None   Collection Time: 12/04/14  7:10 PM  Result Value Ref Range Status   Specimen Description ABDOMEN  Final   Special Requests PATIENT ON FOLLOWING ZINACEF  Final   Gram Stain   Final    FEW WBC PRESENT,BOTH  PMN AND MONONUCLEAR NO SQUAMOUS EPITHELIAL CELLS SEEN NO ORGANISMS SEEN Performed at [facility]    Culture   Final    FEW CANDIDA ALBICANS Performed at [facility]    Report Status 12/07/2014 FINAL  Final  Anaerobic culture     Status: None   Collection Time: 12/04/14  7:10 PM  Result Value Ref Range Status   Specimen Description WOUND ABDOMEN  Final   Special Requests PT ON ZINACEF  Final   Gram Stain   Final    FEW WBC PRESENT,BOTH PMN AND MONONUCLEAR NO SQUAMOUS EPITHELIAL CELLS SEEN NO ORGANISMS SEEN Performed at [facility]    Culture   Final    NO ANAEROBES ISOLATED Performed at [facility]    Report Status 12/09/2014 FINAL  Final     Studies:  Recent x-ray studies have been reviewed in detail by the Attending Physician  Scheduled Meds:  Scheduled Meds: . antiseptic oral rinse  7 mL Mouth Rinse q12n4p  . dextrose  25 mL Intravenous Once  . feeding supplement (ENSURE ENLIVE)  237 mL Oral BID BM  . feeding supplement (PRO-STAT SUGAR FREE 64)  30 mL Oral BID WC  . fluconazole (DIFLUCAN) IV  400 mg Intravenous Q24H  . heparin subcutaneous  5,000 Units Subcutaneous 3 times per day  . lip balm  1 application Topical BID  . methocarbamol  500 mg Oral TID  . metoprolol tartrate  12.5 mg Oral BID  . metroNIDAZOLE  500 mg Oral 3 times per day  . mirtazapine  15 mg Oral QHS  . morphine  15 mg Oral Q12H  . piperacillin-tazobactam (ZOSYN)  IV  3.375 g Intravenous Q8H  . prednisoLONE acetate  1 drop Both Eyes BID  . saccharomyces boulardii  250 mg Oral BID    Time spent on care of this patient: 40 mins   Frank Novelo, Geraldo Docker , MD  [facility] Office  [phone] Pager - [phone]  On-Call/Text Page:      [email or web address]      password [email or web address]  If 7PM-7AM, please contact night-coverage [email or web address] Password [email or web address] 12/12/2014, 11:52 AM   LOS:  8 days   Care during the described time interval was provided by me .  I have  reviewed this patient's available data, including medical history, events of note, physical examination, radiology studies and test results as part of my evaluation  Dia Crawford, MD [phone] Pager

## 2014-12-12 NOTE — Progress Notes (Signed)
PT Cancellation Note  Patient Details Name: ZYLEN WENIG MRN: [id number] DOB: 07-22-1971   Cancelled Treatment:    Reason Eval/Treat Not Completed: Other (comment) (Refused and wants to maybe get up this pm.  Will try to get back this pm or tomorrow.)Thanks.   Irwin Brakeman F 12/12/2014, 8:51 AM [facility] [phone] (pager)

## 2014-12-12 NOTE — Progress Notes (Signed)
Courtesy visit.   He is progressing.  I did see that he is back on his mirtazipine, which is very helpful for his appetite and anxiety.

## 2014-12-12 NOTE — Consult Note (Signed)
Physical Medicine and Rehabilitation Consult  Reason for Consult: Deconditioning Referring Physician: Dr. Sherral Hammers.    HPI: Vincent Black is a 44 y.o. male h/o Crohn's disease, ankylosing spondylitis, anxiety, chronic pain, with most recent admission for c diff colitis and small bowel abscess due to fistula treated with antibiotics and drain placement. He was readmitted on 12/04/14 with worsening abdominal pain and was found to be septic due to perforated sigmoid colon, perforated cecal toxic megacolon, and small bowel fistula.He was taken to OR that evening for exploratory lap with small bowel resection and total abdominal colectomy with end ileostomy by Dr. Donne Hazel. Post op to continue on antibiotics x 2 weeks per Dr. Johnnye Sima.  Abdominal pain is improving and he is tolerating diet advancement to soft. Therapy ongoing and patient limited by pain as well as deconditioning. CIR recommended by MD and PT.   Wife reports that patient has been declining since Nov and has essentially been bed bound (except for ambulating to BR) since Feb due to nausea, abdominal pain and weakness with poor po intake.     Review of Systems  Constitutional: Positive for malaise/fatigue.  HENT: Negative for hearing loss.   Respiratory: Positive for shortness of breath.   Cardiovascular: Positive for chest pain (diffuse pain all over).  Gastrointestinal: Positive for abdominal pain. Negative for heartburn.  Musculoskeletal: Positive for myalgias.  Neurological: Positive for weakness. Negative for headaches.  Psychiatric/Behavioral: Positive for depression. The patient is nervous/anxious.       Past Medical History  Diagnosis Date  . Crohn disease   . C. difficile colitis 09/30/2014  . Ankylosing spondylitis 09/30/2014  . Anxiety     Past Surgical History  Procedure Laterality Date  . Flexible sigmoidoscopy N/A 09/29/2014    Procedure: FLEXIBLE SIGMOIDOSCOPY;  Surgeon: Beryle Beams, MD;  Location:  [place];  Service: Endoscopy;  Laterality: N/A;  . Laparotomy N/A 12/04/2014    Procedure: EXPLORATORY LAPAROTOMY, Total Colectomy;  Surgeon: Rolm Bookbinder, MD;  Location: [place];  Service: General;  Laterality: N/A;    History reviewed. No pertinent family history.    Social History:  Married. Independent prior to Feb 2016.  Wife has been assisting with self care tasks for past few months. Per reports that he has quit smoking. His smoking use included Cigarettes. He has a 5 pack-year smoking history. He has never used smokeless tobacco. He reports that he drinks alcohol. He reports that he does not use illicit drugs.    Allergies: No Known Allergies    Medications Prior to Admission  Medication Sig Dispense Refill  . ALPRAZolam (XANAX) 0.25 MG tablet Take 0.25 mg by mouth 3 (three) times daily as needed for anxiety.    . ciprofloxacin (CIPRO) 500 MG tablet Take 1 tablet (500 mg total) by mouth 2 (two) times daily. 30 tablet 0  . hyoscyamine (LEVBID) 0.375 MG 12 hr tablet Take 0.375 mg by mouth 2 (two) times daily.    . metroNIDAZOLE (FLAGYL) 500 MG tablet Take 1 tablet (500 mg total) by mouth every 8 (eight) hours. 90 tablet 1  . mirtazapine (REMERON SOL-TAB) 15 MG disintegrating tablet Take 15 mg by mouth at bedtime.    Marland Kitchen morphine (MS CONTIN) 30 MG 12 hr tablet Take 1 tablet (30 mg total) by mouth every 12 (twelve) hours. (Patient taking differently: Take 15 mg by mouth every 12 (twelve) hours. ) 60 tablet 0  . pantoprazole (PROTONIX) 40 MG tablet Take 1 tablet (40  mg total) by mouth daily. 30 tablet 0  . prednisoLONE acetate (PRED FORTE) 1 % ophthalmic suspension Place 1 drop into both eyes 2 (two) times daily. Place 1 drop in LEFT eye once daily, place 1 drop in RIGHT eye twice daily    . Probiotic Product (PROBIOTIC PO) Take 1 tablet by mouth daily. Chewable    . promethazine (PHENERGAN) 6.25 MG/5ML syrup Take 12.5 to 25 mg every 6 hours as needed for nausea. 240 mL 2  .  traMADol (ULTRAM) 50 MG tablet Take 50 mg by mouth every 6 (six) hours.    . vancomycin (VANCOCIN) 50 mg/mL oral solution Take 2.5 mLs (125 mg total) by mouth every 6 (six) hours. 10 mL 0  . fluconazole (DIFLUCAN) 100 MG tablet Take 1 tablet (100 mg total) by mouth daily. (Patient not taking: Reported on 12/05/2014) 14 tablet 1  . methocarbamol (ROBAXIN) 500 MG tablet Take 2 tablets (1,000 mg total) by mouth every 8 (eight) hours as needed for muscle spasms. (Patient not taking: Reported on 12/05/2014) 30 tablet 0    Home: [place] expects to be discharged to:: Private residence Living Arrangements: Spouse/significant other Available Help at Discharge: Family, Available 24 hours/day Type of Home: House Home Access: Stairs to enter Technical brewer of Steps: 3 Home Layout: One level Home Equipment: [place] - single point  Functional History: Prior Function Level of Independence: Independent with assistive device(s) Comments: was completely independent up until month ago, began using cane due to weakness due to c-diff Functional Status:  Mobility: Bed Mobility Overal bed mobility: Needs Assistance Bed Mobility: Rolling, Sidelying to Sit Rolling: Min assist, +2 for physical assistance Sidelying to sit: Mod assist, +2 for physical assistance Sit to sidelying: Mod assist General bed mobility comments: VC's for log roll technique, and mod assist to elevate trunk as well as use of pad to get pt to EOB.  Transfers Overall transfer level: Needs assistance Equipment used: 2 person hand held assist Transfers: Sit to/from Stand, Squat Pivot Transfers Sit to Stand: Max assist, +2 physical assistance, From elevated surface Stand pivot transfers: Min assist, +2 physical assistance Squat pivot transfers: Mod assist, +2 physical assistance, From elevated surface General transfer comment: +2 assist provided for safety and support as pt powered-up to only partial  stand with PT  assisting with squat pivot transfer .  Pt with bil knee buckling with notable weakness.   Ambulation/Gait General Gait Details: Deferred due to pt fatigue and pain.     ADL:    Cognition: Cognition Overall Cognitive Status: Within Functional Limits for tasks assessed Orientation Level: Oriented X4 Cognition Arousal/Alertness: Awake/alert Behavior During Therapy: WFL for tasks assessed/performed Overall Cognitive Status: Within Functional Limits for tasks assessed   Blood pressure 132/99, pulse 101, temperature 98 F (36.7 C), temperature source Oral, resp. rate 19, height 5\' 9"  (1.753 m), weight 77.2 kg (170 lb 3.1 oz), SpO2 96 %. Physical Exam  Nursing note and vitals reviewed. Constitutional: He is oriented to person, place, and time. He appears well-developed and well-nourished.  Lying flat in bed being fed by wife. Reports chewing causes diffuse pain.   HENT:  Head: Normocephalic and atraumatic.  Eyes: Conjunctivae are normal. Pupils are equal, round, and reactive to light.  Neck: Normal range of motion.  Cardiovascular: Normal rate and regular rhythm.   Respiratory: Effort normal and breath sounds normal. No respiratory distress. He has no wheezes. He exhibits tenderness.  GI: Soft. Bowel sounds are normal. There is tenderness.  Midline incision with VAC dressing  Musculoskeletal: He exhibits edema (2+ pedal edema bilaterally. ).  Diffuse weakness. Testing limited by abdominal and chest wall pain due to strength testing.   Neurological: He is alert and oriented to person, place, and time.  Pt somewhat quiet/fatigued appearing. Cooperative. Has fair insight and awareness. UE: 4/5 prox to distal. LE: 2/5 hf, 3/5 ke and 3+ to  4- adf/apf. Intact sensation to LT and PP.   Skin: Skin is warm and dry.  Psychiatric: His speech is normal. His mood appears anxious. His affect is blunt. He is slowed and withdrawn.    Results for orders placed or performed during the hospital  encounter of 12/04/14 (from the past 24 hour(s))  Glucose, capillary     Status: Abnormal   Collection Time: 12/11/14  8:55 AM  Result Value Ref Range   Glucose-Capillary 122 (H) 70 - 99 mg/dL  Glucose, capillary     Status: Abnormal   Collection Time: 12/11/14 12:57 PM  Result Value Ref Range   Glucose-Capillary 130 (H) 70 - 99 mg/dL  Glucose, capillary     Status: Abnormal   Collection Time: 12/11/14  5:14 PM  Result Value Ref Range   Glucose-Capillary 103 (H) 70 - 99 mg/dL  Glucose, capillary     Status: Abnormal   Collection Time: 12/11/14  8:45 PM  Result Value Ref Range   Glucose-Capillary 108 (H) 70 - 99 mg/dL  Basic metabolic panel     Status: Abnormal   Collection Time: 12/12/14  3:56 AM  Result Value Ref Range   Sodium 139 135 - 145 mmol/L   Potassium 4.2 3.5 - 5.1 mmol/L   Chloride 111 101 - 111 mmol/L   CO2 20 (L) 22 - 32 mmol/L   Glucose, Bld 111 (H) 70 - 99 mg/dL   BUN 19 6 - 20 mg/dL   Creatinine, Ser 1.68 (H) 0.61 - 1.24 mg/dL   Calcium 6.9 (L) 8.9 - 10.3 mg/dL   GFR calc non Af Amer 49 (L) >60 mL/min   GFR calc Af Amer 56 (L) >60 mL/min   Anion gap 8 5 - 15   No results found.  Assessment/Plan: Diagnosis: severe debilitation due to peritonitis and multiple medical issues as outlined above 1. Does the need for close, 24 hr/day medical supervision in concert with the patient's rehab needs make it unreasonable for this patient to be served in a less intensive setting? Yes 2. Co-Morbidities requiring supervision/potential complications: c diff, abdominal wound,  3. Due to bladder management, bowel management, safety, skin/wound care, disease management, medication administration, pain management and patient education, does the patient require 24 hr/day rehab nursing? Yes 4. Does the patient require coordinated care of a physician, rehab nurse, PT (1-2 hrs/day, 5 days/week) and OT (1-2 hrs/day, 5 days/week) to address physical and functional deficits in the context  of the above medical diagnosis(es)? Yes Addressing deficits in the following areas: balance, endurance, locomotion, strength, transferring, bowel/bladder control, bathing, dressing, feeding, grooming, toileting and psychosocial support 5. Can the patient actively participate in an intensive therapy program of at least 3 hrs of therapy per day at least 5 days per week? Yes 6. The potential for patient to make measurable gains while on inpatient rehab is excellent 7. Anticipated functional outcomes upon discharge from inpatient rehab are modified independent and supervision  with PT, modified independent and supervision with OT, n/a with SLP. 8. Estimated rehab length of stay to reach the above functional goals is:  18-22 days 9. Does the patient have adequate social supports and living environment to accommodate these discharge functional goals? Yes 10. Anticipated D/C setting: Home 11. Anticipated post D/C treatments: HH therapy and Outpatient therapy 12. Overall Rehab/Functional Prognosis: excellent  RECOMMENDATIONS: This patient's condition is appropriate for continued rehabilitative care in the following setting: CIR Patient has agreed to participate in recommended program. Yes Note that insurance prior authorization may be required for reimbursement for recommended care.  Comment: Pt is motivated and has a supportive family. Rehab Admissions Coordinator to follow up.  Thanks,  Meredith Staggers, MD, [facility]     12/12/2014

## 2014-12-12 NOTE — Progress Notes (Signed)
8 Days Post-Op  Subjective: Still with moderate incisional pain, pain with VAC change  Objective: Vital signs in last 24 hours: Temp:  [97.6 F (36.4 C)-99 F (37.2 C)] 98 F (36.7 C) (05/03 0400) Pulse Rate:  [92-102] 101 (05/03 0600) Resp:  [16-20] 19 (05/03 0600) BP: (122-149)/(95-106) 132/99 mmHg (05/03 0600) SpO2:  [96 %-99 %] 96 % (05/03 0600) Last BM Date: 12/10/14  Intake/Output from previous day: 05/02 0701 - 05/03 0700 In: 2600 [I.V.:2300; IV Piggyback:300] Out: 975 [Urine:850; Drains:50; Stool:75] Intake/Output this shift:    Abdomen is soft, drain is serous, VAC is in place  Lab Results:   Recent Labs  12/10/14 0445  WBC 38.2*  HGB 7.5*  HCT 23.9*  PLT 321   BMET  Recent Labs  12/11/14 0527 12/12/14 0356  NA 139 139  K 3.7 4.2  CL 111 111  CO2 23 20*  GLUCOSE 128* 111*  BUN 22* 19  CREATININE 1.82* 1.68*  CALCIUM 6.9* 6.9*   PT/INR No results for input(s): LABPROT, INR in the last 72 hours. ABG No results for input(s): PHART, HCO3 in the last 72 hours.  Invalid input(s): PCO2, PO2  Studies/Results: No results found.  Anti-infectives: Anti-infectives    Start     Dose/Rate Route Frequency Ordered Stop   12/07/14 1300  fluconazole (DIFLUCAN) IVPB 200 mg     200 mg 100 mL/hr over 60 Minutes Intravenous Every 24 hours 12/07/14 1102     12/06/14 1300  fluconazole (DIFLUCAN) IVPB 400 mg  Status:  Discontinued     400 mg 100 mL/hr over 120 Minutes Intravenous Every 24 hours 12/06/14 1119 12/07/14 1102   12/06/14 0000  micafungin (MYCAMINE) 100 mg in sodium chloride 0.9 % 100 mL IVPB  Status:  Discontinued     100 mg 100 mL/hr over 1 Hours Intravenous Daily 12/05/14 1108 12/05/14 1332   12/05/14 1500  micafungin (MYCAMINE) 100 mg in sodium chloride 0.9 % 100 mL IVPB  Status:  Discontinued     100 mg 100 mL/hr over 1 Hours Intravenous Daily 12/05/14 1332 12/06/14 1045   12/05/14 1200  metroNIDAZOLE (FLAGYL) IVPB 500 mg  Status:   Discontinued     500 mg 100 mL/hr over 60 Minutes Intravenous Every 8 hours 12/05/14 1108 12/08/14 0805   12/05/14 1000  micafungin (MYCAMINE) 100 mg in sodium chloride 0.9 % 100 mL IVPB  Status:  Discontinued     100 mg 100 mL/hr over 1 Hours Intravenous Daily 12/04/14 2255 12/04/14 2343   12/05/14 0000  fluconazole (DIFLUCAN) IVPB 200 mg  Status:  Discontinued     200 mg 100 mL/hr over 60 Minutes Intravenous Every 24 hours 12/04/14 2259 12/05/14 1107   12/05/14 0000  vancomycin (VANCOCIN) IVPB 1000 mg/200 mL premix  Status:  Discontinued     1,000 mg 200 mL/hr over 60 Minutes Intravenous Every 8 hours 12/04/14 2356 12/06/14 0850   12/04/14 1515  piperacillin-tazobactam (ZOSYN) IVPB 3.375 g     3.375 g 12.5 mL/hr over 240 Minutes Intravenous Every 8 hours 12/04/14 1510     12/04/14 1415  vancomycin (VANCOCIN) 50 mg/mL oral solution 125 mg  Status:  Discontinued     125 mg Oral 4 times per day 12/04/14 1402 12/04/14 2346   12/04/14 1415  ciprofloxacin (CIPRO) IVPB 400 mg  Status:  Discontinued     400 mg 200 mL/hr over 60 Minutes Intravenous Every 12 hours 12/04/14 1402 12/04/14 1504      Assessment/Plan:  s/p Procedure(s): EXPLORATORY LAPAROTOMY, Total Colectomy (N/A)  Patient stable.   I don't think anything is going on in the abdomen.  Will get his drain out tomorrow.  Should his pain worsen, ostomy output decline significantly, or he develops fever, will CT his abdomen Ok from surgical standpoint to go to the floor  LOS: 8 days    Amelita Risinger A 12/12/2014

## 2014-12-12 NOTE — Progress Notes (Addendum)
INFECTIOUS DISEASE PROGRESS NOTE  Vincent Black is a 44 y.o. male with  Principal Problem:   Abdominal pain Active Problems:   Crohn's disease   History of Clostridium difficile colitis   Protein-calorie malnutrition, severe   Unintentional weight loss   Enteritis due to Clostridium difficile   Encounter for nasogastric (NG) tube placement   Fever   Perforation of sigmoid colon   C. difficile colitis   Septic shock   Toxic megacolon   Small bowel fistula   Sinus tachycardia   Hypomagnesemia   Metabolic acidosis   Anemia of chronic disease   Hyperglycemia   Leukocytosis   Chronic pain syndrome   Anxiety state  Subjective: Continued pain. Ate this AM, has worked with PT.   Abtx:  Anti-infectives    Start     Dose/Rate Route Frequency Ordered Stop   12/07/14 1300  fluconazole (DIFLUCAN) IVPB 200 mg     200 mg 100 mL/hr over 60 Minutes Intravenous Every 24 hours 12/07/14 1102     12/06/14 1300  fluconazole (DIFLUCAN) IVPB 400 mg  Status:  Discontinued     400 mg 100 mL/hr over 120 Minutes Intravenous Every 24 hours 12/06/14 1119 12/07/14 1102   12/06/14 0000  micafungin (MYCAMINE) 100 mg in sodium chloride 0.9 % 100 mL IVPB  Status:  Discontinued     100 mg 100 mL/hr over 1 Hours Intravenous Daily 12/05/14 1108 12/05/14 1332   12/05/14 1500  micafungin (MYCAMINE) 100 mg in sodium chloride 0.9 % 100 mL IVPB  Status:  Discontinued     100 mg 100 mL/hr over 1 Hours Intravenous Daily 12/05/14 1332 12/06/14 1045   12/05/14 1200  metroNIDAZOLE (FLAGYL) IVPB 500 mg  Status:  Discontinued     500 mg 100 mL/hr over 60 Minutes Intravenous Every 8 hours 12/05/14 1108 12/08/14 0805   12/05/14 1000  micafungin (MYCAMINE) 100 mg in sodium chloride 0.9 % 100 mL IVPB  Status:  Discontinued     100 mg 100 mL/hr over 1 Hours Intravenous Daily 12/04/14 2255 12/04/14 2343   12/05/14 0000  fluconazole (DIFLUCAN) IVPB 200 mg  Status:  Discontinued     200 mg 100 mL/hr over 60  Minutes Intravenous Every 24 hours 12/04/14 2259 12/05/14 1107   12/05/14 0000  vancomycin (VANCOCIN) IVPB 1000 mg/200 mL premix  Status:  Discontinued     1,000 mg 200 mL/hr over 60 Minutes Intravenous Every 8 hours 12/04/14 2356 12/06/14 0850   12/04/14 1515  piperacillin-tazobactam (ZOSYN) IVPB 3.375 g     3.375 g 12.5 mL/hr over 240 Minutes Intravenous Every 8 hours 12/04/14 1510     12/04/14 1415  vancomycin (VANCOCIN) 50 mg/mL oral solution 125 mg  Status:  Discontinued     125 mg Oral 4 times per day 12/04/14 1402 12/04/14 2346   12/04/14 1415  ciprofloxacin (CIPRO) IVPB 400 mg  Status:  Discontinued     400 mg 200 mL/hr over 60 Minutes Intravenous Every 12 hours 12/04/14 1402 12/04/14 1504      Medications:  Scheduled: . antiseptic oral rinse  7 mL Mouth Rinse q12n4p  . dextrose  25 mL Intravenous Once  . feeding supplement (ENSURE ENLIVE)  237 mL Oral BID BM  . feeding supplement (PRO-STAT SUGAR FREE 64)  30 mL Oral BID WC  . fluconazole (DIFLUCAN) IV  200 mg Intravenous Q24H  . heparin subcutaneous  5,000 Units Subcutaneous 3 times per day  . lip balm  1 application Topical BID  . metoprolol tartrate  12.5 mg Oral BID  . mirtazapine  15 mg Oral QHS  . morphine  15 mg Oral Q12H  . piperacillin-tazobactam (ZOSYN)  IV  3.375 g Intravenous Q8H  . prednisoLONE acetate  1 drop Both Eyes BID  . saccharomyces boulardii  250 mg Oral BID    Objective: Vital signs in last 24 hours: Temp:  [97.9 F (36.6 C)-99 F (37.2 C)] 98 F (36.7 C) (05/03 0834) Pulse Rate:  [92-102] 101 (05/03 0834) Resp:  [16-21] 21 (05/03 0834) BP: (122-149)/(95-106) 140/100 mmHg (05/03 0834) SpO2:  [96 %-99 %] 98 % (05/03 0834)   General appearance: alert, cooperative and no distress Resp: clear to auscultation bilaterally Cardio: regular rate and rhythm GI: normal findings: soft and abnormal findings:  hypoactive bowel sounds and vac site is clean.   Lab Results  Recent Labs  12/10/14 0445  12/11/14 0527 12/12/14 0356  WBC 38.2*  --   --   HGB 7.5*  --   --   HCT 23.9*  --   --   NA 137 139 139  K 3.6 3.7 4.2  CL 108 111 111  CO2 22 23 20*  BUN 22* 22* 19  CREATININE 2.03* 1.82* 1.68*   Liver Panel No results for input(s): PROT, ALBUMIN, AST, ALT, ALKPHOS, BILITOT, BILIDIR, IBILI in the last 72 hours. Sedimentation Rate No results for input(s): ESRSEDRATE in the last 72 hours. C-Reactive Protein No results for input(s): CRP in the last 72 hours.  Microbiology: Recent Results (from the past 240 hour(s))  Culture, blood (x 2)     Status: None   Collection Time: 12/04/14  4:50 PM  Result Value Ref Range Status   Specimen Description BLOOD LEFT HAND  Final   Special Requests BOTTLES DRAWN AEROBIC ONLY [place]  Final   Culture   Final    NO GROWTH 5 DAYS Performed at [facility]    Report Status 12/11/2014 FINAL  Final  Culture, blood (x 2)     Status: None   Collection Time: 12/04/14  4:50 PM  Result Value Ref Range Status   Specimen Description BLOOD RIGHT HAND  Final   Special Requests BOTTLES DRAWN AEROBIC ONLY 5CC  Final   Culture   Final    NO GROWTH 5 DAYS Performed at [facility]    Report Status 12/11/2014 FINAL  Final  MRSA PCR Screening     Status: None   Collection Time: 12/04/14  5:32 PM  Result Value Ref Range Status   MRSA by PCR NEGATIVE NEGATIVE Final    Comment:        The GeneXpert MRSA Assay (FDA approved for NASAL specimens only), is one component of a comprehensive MRSA colonization surveillance program. It is not intended to diagnose MRSA infection nor to guide or monitor treatment for MRSA infections.   Wound culture     Status: None   Collection Time: 12/04/14  7:10 PM  Result Value Ref Range Status   Specimen Description ABDOMEN  Final   Special Requests PATIENT ON FOLLOWING ZINACEF  Final   Gram Stain   Final    FEW WBC PRESENT,BOTH PMN AND MONONUCLEAR NO SQUAMOUS EPITHELIAL CELLS SEEN NO ORGANISMS  SEEN Performed at [facility]    Culture   Final    FEW CANDIDA ALBICANS Performed at [facility]    Report Status 12/07/2014 FINAL  Final  Anaerobic culture  Status: None   Collection Time: 12/04/14  7:10 PM  Result Value Ref Range Status   Specimen Description WOUND ABDOMEN  Final   Special Requests PT ON ZINACEF  Final   Gram Stain   Final    FEW WBC PRESENT,BOTH PMN AND MONONUCLEAR NO SQUAMOUS EPITHELIAL CELLS SEEN NO ORGANISMS SEEN Performed at [facility]    Culture   Final    NO ANAEROBES ISOLATED Performed at [facility]    Report Status 12/09/2014 FINAL  Final    Studies/Results: No results found.   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   Would aim for 2 weeks of zosyn/flucon/flagyl Repeat C diff being sent- if negative, ok to come out of isolation if ok with IP. Would not stop his po flagyl if negative.  Then continue flagyl po for 2 weeks after this (if he is unable to tolerate, change to po vanco) Available if questions    Bobby Rumpf Infectious Diseases (pager) [phone] [email or web address] 12/12/2014, 9:59 AM  LOS: 8 days

## 2014-12-12 NOTE — Progress Notes (Signed)
Rehab admissions - Evaluated for possible admission.  I spoke with family briefly this am.  I called the on site reviewers for [facility] insurance.  Patient does not have a qualifying diagnosis for acute inpatient rehab per insurance carrier.  Although rehab MD deemed patient appropriate for inpatient rehab, we will not be able to get insurance authorization.  Options likely will be SNF or HH therapies once patient is medically ready for discharge.  Call me for questions.  #[phone]

## 2014-12-12 NOTE — Progress Notes (Addendum)
ANTIBIOTIC CONSULT NOTE - Follow-Up  Pharmacy Consult for fluconazole and zosyn Indication: sepsis  No Known Allergies  Patient Measurements: Height: 5\' 9"  (175.3 cm) Weight: 170 lb 3.1 oz (77.2 kg) IBW/kg (Calculated) : 70.7  Vital Signs: Temp: 98 F (36.7 C) (05/03 0834) Temp Source: Oral (05/03 0834) BP: 140/100 mmHg (05/03 0834) Pulse Rate: 101 (05/03 0834) Intake/Output from previous day: 05/02 0701 - 05/03 0700 In: 2600 [I.V.:2300; IV Piggyback:300] Out: 975 [Urine:850; Drains:50; Stool:75] Intake/Output from this shift: Total I/O In: -  Out: 300 [Urine:300]  Labs:  Recent Labs  12/10/14 0445 12/11/14 0527 12/12/14 0356  WBC 38.2*  --   --   HGB 7.5*  --   --   PLT 321  --   --   CREATININE 2.03* 1.82* 1.68*   Estimated Creatinine Clearance: 57.3 mL/min (by C-G formula based on Cr of 1.68). No results for input(s): VANCOTROUGH, VANCOPEAK, VANCORANDOM, GENTTROUGH, GENTPEAK, GENTRANDOM, TOBRATROUGH, TOBRAPEAK, TOBRARND, AMIKACINPEAK, AMIKACINTROU, AMIKACIN in the last 72 hours.   Microbiology: Recent Results (from the past 720 hour(s))  Culture, blood (x 2)     Status: None   Collection Time: 12/04/14  4:50 PM  Result Value Ref Range Status   Specimen Description BLOOD LEFT HAND  Final   Special Requests BOTTLES DRAWN AEROBIC ONLY [place]  Final   Culture   Final    NO GROWTH 5 DAYS Performed at [facility]    Report Status 12/11/2014 FINAL  Final  Culture, blood (x 2)     Status: None   Collection Time: 12/04/14  4:50 PM  Result Value Ref Range Status   Specimen Description BLOOD RIGHT HAND  Final   Special Requests BOTTLES DRAWN AEROBIC ONLY 5CC  Final   Culture   Final    NO GROWTH 5 DAYS Performed at [facility]    Report Status 12/11/2014 FINAL  Final  MRSA PCR Screening     Status: None   Collection Time: 12/04/14  5:32 PM  Result Value Ref Range Status   MRSA by PCR NEGATIVE NEGATIVE Final    Comment:        The GeneXpert  MRSA Assay (FDA approved for NASAL specimens only), is one component of a comprehensive MRSA colonization surveillance program. It is not intended to diagnose MRSA infection nor to guide or monitor treatment for MRSA infections.   Wound culture     Status: None   Collection Time: 12/04/14  7:10 PM  Result Value Ref Range Status   Specimen Description ABDOMEN  Final   Special Requests PATIENT ON FOLLOWING ZINACEF  Final   Gram Stain   Final    FEW WBC PRESENT,BOTH PMN AND MONONUCLEAR NO SQUAMOUS EPITHELIAL CELLS SEEN NO ORGANISMS SEEN Performed at [facility]    Culture   Final    FEW CANDIDA ALBICANS Performed at [facility]    Report Status 12/07/2014 FINAL  Final  Anaerobic culture     Status: None   Collection Time: 12/04/14  7:10 PM  Result Value Ref Range Status   Specimen Description WOUND ABDOMEN  Final   Special Requests PT ON ZINACEF  Final   Gram Stain   Final    FEW WBC PRESENT,BOTH PMN AND MONONUCLEAR NO SQUAMOUS EPITHELIAL CELLS SEEN NO ORGANISMS SEEN Performed at [facility]    Culture   Final    NO ANAEROBES ISOLATED Performed at [facility]    Report Status 12/09/2014 FINAL  Final    Medical History: Past Medical History  Diagnosis Date  . Crohn disease   . C. difficile colitis 09/30/2014  . Ankylosing spondylitis 09/30/2014  . Anxiety     Medications:  Scheduled:  . antiseptic oral rinse  7 mL Mouth Rinse q12n4p  . dextrose  25 mL Intravenous Once  . feeding supplement (ENSURE ENLIVE)  237 mL Oral BID BM  . feeding supplement (PRO-STAT SUGAR FREE 64)  30 mL Oral BID WC  . fluconazole (DIFLUCAN) IV  200 mg Intravenous Q24H  . heparin subcutaneous  5,000 Units Subcutaneous 3 times per day  . lip balm  1 application Topical BID  . metoprolol tartrate  12.5 mg Oral BID  . mirtazapine  15 mg Oral QHS  . morphine  15 mg Oral Q12H  . piperacillin-tazobactam (ZOSYN)  IV  3.375 g Intravenous Q8H  .  prednisoLONE acetate  1 drop Both Eyes BID  . saccharomyces boulardii  250 mg Oral BID   Infusions:  . dextrose 5 % and 0.9% NaCl 1,000 mL with potassium chloride 20 mEq infusion 100 mL/hr at 12/12/14 0500   PRN: acetaminophen, alum & mag hydroxide-simeth, butamben-tetracaine-benzocaine, diphenhydrAMINE, fentaNYL (SUBLIMAZE) injection, LORazepam, magic mouthwash, ondansetron **OR** ondansetron (ZOFRAN) IV, phenol, promethazine   Assessment: 44yo male w/ h/o Crohn's, most recent hospital discharge 3/30 after severe sepsis 2/2 intraabdominal abscess, now c/o sudden onset of RUQ pain, generalized weakness, and hematemesis, admitted to MICU after ex lap and total colectomy (12/04/14) and now on IV zosyn and fluconazole for peritonitis. ID also followed and plans noted for 2 weeks of antibiotics post colectomy and po flagyl or vancomycin to continue until 2 weeks after antibiotics are completed. Flagyl was discontinued on 4/29 (reasons are unclear)  -Spoke with Dr. Sherral Hammers and will resume po flagyl.  Fluconazole 4/26; 4/27 >> Cipro 4/25 x 1 Zosyn 4/25 >> Micafungin 4/26 x 1 Flagyl 4/26 >> 4/29; 5/3>> Vanc po 4/26 >> 4/27  4/25 wound: candida albicans 4/25 blood: neg 4/25 mrsa: neg  Plan:  -Increase fluconazole to 400mg  IV q24hr -Continue zosyn -Resume flagyl as 500mg  po q8h -Will follow renal function and clinical progress  Hildred Laser, Pharm D 12/12/2014 10:09 AM

## 2014-12-13 DIAGNOSIS — N179 Acute kidney failure, unspecified: Secondary | ICD-10-CM

## 2014-12-13 HISTORY — DX: Acute kidney failure, unspecified: N17.9

## 2014-12-13 LAB — CBC WITH DIFFERENTIAL/PLATELET
BASOS PCT: 0 % (ref 0–1)
Basophils Absolute: 0 10*3/uL (ref 0.0–0.1)
EOS ABS: 0.8 10*3/uL — AB (ref 0.0–0.7)
EOS PCT: 4 % (ref 0–5)
HCT: 23.1 % — ABNORMAL LOW (ref 39.0–52.0)
Hemoglobin: 7 g/dL — ABNORMAL LOW (ref 13.0–17.0)
Lymphocytes Relative: 11 % — ABNORMAL LOW (ref 12–46)
Lymphs Abs: 2.4 10*3/uL (ref 0.7–4.0)
MCH: 27.7 pg (ref 26.0–34.0)
MCHC: 30.3 g/dL (ref 30.0–36.0)
MCV: 91.3 fL (ref 78.0–100.0)
Monocytes Absolute: 0.9 10*3/uL (ref 0.1–1.0)
Monocytes Relative: 4 % (ref 3–12)
Neutro Abs: 17.7 10*3/uL — ABNORMAL HIGH (ref 1.7–7.7)
Neutrophils Relative %: 81 % — ABNORMAL HIGH (ref 43–77)
PLATELETS: 459 10*3/uL — AB (ref 150–400)
RBC: 2.53 MIL/uL — ABNORMAL LOW (ref 4.22–5.81)
RDW: 19.5 % — ABNORMAL HIGH (ref 11.5–15.5)
WBC: 21.9 10*3/uL — ABNORMAL HIGH (ref 4.0–10.5)

## 2014-12-13 LAB — GLUCOSE, CAPILLARY
GLUCOSE-CAPILLARY: 113 mg/dL — AB (ref 70–99)
GLUCOSE-CAPILLARY: 88 mg/dL (ref 70–99)
Glucose-Capillary: 101 mg/dL — ABNORMAL HIGH (ref 70–99)
Glucose-Capillary: 99 mg/dL (ref 70–99)
Glucose-Capillary: 102 mg/dL — ABNORMAL HIGH (ref 70–99)

## 2014-12-13 LAB — COMPREHENSIVE METABOLIC PANEL
ALK PHOS: 790 U/L — AB (ref 38–126)
ALT: 10 U/L — ABNORMAL LOW (ref 17–63)
AST: 28 U/L (ref 15–41)
Anion gap: 10 (ref 5–15)
BILIRUBIN TOTAL: 0.4 mg/dL (ref 0.3–1.2)
BUN: 20 mg/dL (ref 6–20)
CO2: 20 mmol/L — AB (ref 22–32)
Calcium: 7 mg/dL — ABNORMAL LOW (ref 8.9–10.3)
Chloride: 111 mmol/L (ref 101–111)
Creatinine, Ser: 1.65 mg/dL — ABNORMAL HIGH (ref 0.61–1.24)
GFR calc Af Amer: 58 mL/min — ABNORMAL LOW (ref >60)
GFR, EST NON AFRICAN AMERICAN: 50 mL/min — AB (ref >60)
Glucose, Bld: 83 mg/dL (ref 70–99)
POTASSIUM: 4.9 mmol/L (ref 3.5–5.1)
Sodium: 141 mmol/L (ref 135–145)
Total Protein: 4.6 g/dL — ABNORMAL LOW (ref 6.5–8.1)

## 2014-12-13 LAB — PHOSPHORUS: PHOSPHORUS: 2.7 mg/dL (ref 2.5–4.6)

## 2014-12-13 LAB — MAGNESIUM: MAGNESIUM: 1.6 mg/dL — AB (ref 1.7–2.4)

## 2014-12-13 MED ORDER — OXYCODONE HCL 5 MG PO TABS
5.0000 mg | ORAL_TABLET | ORAL | Status: DC | PRN
  Administered 2014-12-18 – 2014-12-20 (×2): 10 mg via ORAL
  Filled 2014-12-13 (×2): qty 2

## 2014-12-13 MED ORDER — OXYCODONE HCL 5 MG PO TABS
15.0000 mg | ORAL_TABLET | ORAL | Status: DC | PRN
  Administered 2014-12-13 – 2014-12-20 (×21): 15 mg via ORAL
  Filled 2014-12-13 (×24): qty 3

## 2014-12-13 NOTE — Progress Notes (Signed)
Patient ID: Vincent Black, male   DOB: 1971-01-23, 43 y.o.   MRN: [id number] 9 Days Post-Op  Subjective: Pt feels ok, but having difficulty with pain control.  He is tolerating small amounts of solid food, mostly drinking Ensure and has eaten some protein bars  Objective: Vital signs in last 24 hours: Temp:  [98 F (36.7 C)-98.5 F (36.9 C)] 98.4 F (36.9 C) (05/04 0518) Pulse Rate:  [97-105] 98 (05/04 0940) Resp:  [16-26] 16 (05/04 0518) BP: (138-164)/(82-107) 140/82 mmHg (05/04 0940) SpO2:  [97 %-99 %] 99 % (05/04 0518) Weight:  [77.3 kg (170 lb 6.7 oz)] 77.3 kg (170 lb 6.7 oz) (05/03 2155) Last BM Date: 12/13/14  Intake/Output from previous day: 05/03 0701 - 05/04 0700 In: 1700 [I.V.:1700] Out: 1420 [Urine:1300; Drains:120] Intake/Output this shift: Total I/O In: 200 [P.O.:200] Out: -   PE: Abd: soft, appropriately tender, wound VAC in place with serous output, drain with minimal serous output.  Ostomy with good amount of stool, but bag has pulled away and is leaking.  Lab Results:   Recent Labs  12/13/14 0430  WBC 21.9*  HGB 7.0*  HCT 23.1*  PLT 459*   BMET  Recent Labs  12/12/14 0356 12/13/14 0430  NA 139 141  K 4.2 4.9  CL 111 111  CO2 20* 20*  GLUCOSE 111* 83  BUN 19 20  CREATININE 1.68* 1.65*  CALCIUM 6.9* 7.0*   PT/INR No results for input(s): LABPROT, INR in the last 72 hours. CMP     Component Value Date/Time   NA 141 12/13/2014 0430   K 4.9 12/13/2014 0430   CL 111 12/13/2014 0430   CO2 20* 12/13/2014 0430   GLUCOSE 83 12/13/2014 0430   BUN 20 12/13/2014 0430   CREATININE 1.65* 12/13/2014 0430   CALCIUM 7.0* 12/13/2014 0430   PROT 4.6* 12/13/2014 0430   ALBUMIN <1.0* 12/13/2014 0430   AST 28 12/13/2014 0430   ALT 10* 12/13/2014 0430   ALKPHOS 790* 12/13/2014 0430   BILITOT 0.4 12/13/2014 0430   GFRNONAA 50* 12/13/2014 0430   GFRAA 58* 12/13/2014 0430   Lipase  No results found for: LIPASE     Studies/Results: No results  found.  Anti-infectives: Anti-infectives    Start     Dose/Rate Route Frequency Ordered Stop   12/12/14 1400  metroNIDAZOLE (FLAGYL) tablet 500 mg     500 mg Oral 3 times per day 12/12/14 1011     12/12/14 1300  fluconazole (DIFLUCAN) IVPB 400 mg     400 mg 100 mL/hr over 120 Minutes Intravenous Every 24 hours 12/12/14 1015     12/07/14 1300  fluconazole (DIFLUCAN) IVPB 200 mg  Status:  Discontinued     200 mg 100 mL/hr over 60 Minutes Intravenous Every 24 hours 12/07/14 1102 12/12/14 1015   12/06/14 1300  fluconazole (DIFLUCAN) IVPB 400 mg  Status:  Discontinued     400 mg 100 mL/hr over 120 Minutes Intravenous Every 24 hours 12/06/14 1119 12/07/14 1102   12/06/14 0000  micafungin (MYCAMINE) 100 mg in sodium chloride 0.9 % 100 mL IVPB  Status:  Discontinued     100 mg 100 mL/hr over 1 Hours Intravenous Daily 12/05/14 1108 12/05/14 1332   12/05/14 1500  micafungin (MYCAMINE) 100 mg in sodium chloride 0.9 % 100 mL IVPB  Status:  Discontinued     100 mg 100 mL/hr over 1 Hours Intravenous Daily 12/05/14 1332 12/06/14 1045   12/05/14 1200  metroNIDAZOLE (FLAGYL)  IVPB 500 mg  Status:  Discontinued     500 mg 100 mL/hr over 60 Minutes Intravenous Every 8 hours 12/05/14 1108 12/08/14 0805   12/05/14 1000  micafungin (MYCAMINE) 100 mg in sodium chloride 0.9 % 100 mL IVPB  Status:  Discontinued     100 mg 100 mL/hr over 1 Hours Intravenous Daily 12/04/14 2255 12/04/14 2343   12/05/14 0000  fluconazole (DIFLUCAN) IVPB 200 mg  Status:  Discontinued     200 mg 100 mL/hr over 60 Minutes Intravenous Every 24 hours 12/04/14 2259 12/05/14 1107   12/05/14 0000  vancomycin (VANCOCIN) IVPB 1000 mg/200 mL premix  Status:  Discontinued     1,000 mg 200 mL/hr over 60 Minutes Intravenous Every 8 hours 12/04/14 2356 12/06/14 0850   12/04/14 1515  piperacillin-tazobactam (ZOSYN) IVPB 3.375 g     3.375 g 12.5 mL/hr over 240 Minutes Intravenous Every 8 hours 12/04/14 1510     12/04/14 1415  vancomycin  (VANCOCIN) 50 mg/mL oral solution 125 mg  Status:  Discontinued     125 mg Oral 4 times per day 12/04/14 1402 12/04/14 2346   12/04/14 1415  ciprofloxacin (CIPRO) IVPB 400 mg  Status:  Discontinued     400 mg 200 mL/hr over 60 Minutes Intravenous Every 12 hours 12/04/14 1402 12/04/14 1504       Assessment/Plan   POD 9, s/p subtotal colectomy with ileostomy -on soft diet, not eating much, but taking in Ensure and protein bars -continue to mobilize -patient on MS contin 15mg  BID.  I have DC his vicodin as that was not working for him.  I have written for oxycodone 5-10 for moderate pain and 15mg  for severe pain to see if this will control him better.  He was on a lot of narcotics prior to this admission and required a lot of narcotics last admission.  It does not surprise me that we are having some pain control issues -cont VAC changes, MWF -WBC trending down to 21K today. -cont IV abx therapy -cont to follow   LOS: 9 days    Hetty Linhart E 12/13/2014, 10:24 AM Pager: [phone]

## 2014-12-13 NOTE — Clinical Social Work Note (Addendum)
Clinical Social Work Assessment  Patient Details  Name: Vincent Black MRN: [id number] Date of Birth: 08-06-1971  Date of referral:  12/13/14               Reason for consult:  Discharge Planning                Permission sought to share information with:  Family Supports Permission granted to share information::  Yes, Verbal Permission Granted  Name::        Agency::     Relationship::     Contact Information:     Housing/Transportation Living arrangements for the past 2 months:  Single Family Home Source of Information:  Parent, Spouse Patient Interpreter Needed:  None Criminal Activity/Legal Involvement Pertinent to Current Situation/Hospitalization:  No - Comment as needed Significant Relationships:  Spouse, Parents Lives with:  Spouse Do you feel safe going back to the place where you live?  Yes Need for family participation in patient care:  Yes (Comment)  Care giving concerns:  Patient and family do not report any concerns and plan fr a DC home at this time. Patient does want to discuss this plan further with the family.   Social Worker assessment / plan:  CSW met with patient and patient's dad and spouse. Patient's family report that they plan to DC home with HHPT when medically ready. Patient states that he wants to discuss this further with his family before making an official decision. CSW suspects that the patient and family will ultimately choose to return home as they seemed opposed to SNF placement. Per family, the patient has strong supports at home. CSW has confirmed this information with patient.  Employment status:    Forensic scientist:  Managed Care PT Recommendations:  [place] / Referral to community resources:  [place]  Patient/Family's Response to care:  Patient and family have been happy with the care here in the hospital. They did hope for a CIR admission, but this is no longer an option.    Patient/Family's Understanding of and Emotional Response to Diagnosis, Current Treatment, and Prognosis:  Patient and family seem to have good understanding of reason for admission.   Emotional Assessment Appearance:  Appears stated age Attitude/Demeanor/Rapport:  Other (Appropriate) Affect (typically observed):  Quiet, Guarded Orientation:  Oriented to Self, Oriented to Place, Oriented to  Time, Oriented to Situation Alcohol / Substance use:    Psych involvement (Current and /or in the community):     Discharge Needs  Concerns to be addressed:    Readmission within the last 30 days:    Current discharge risk:    Barriers to Discharge:      Rigoberto Noel, LCSW 12/13/2014, 12:07 PM

## 2014-12-13 NOTE — Consult Note (Signed)
WOC wound consult note Reason for Consult: Vac dressing changed to abd post-op wound. CCS following for assessment and plan of care. Wound type: Full thickness midline abd wound Wound bed: 90% red, 10% yellow, retention sutures visible to inner wound. Drainage (amount, consistency, odor) Mod amt yellow drainage in cannister, no odor. Periwound: Intact skin surrounding. Dressing procedure/placement/frequency: Applied one piece black foam to midline abd and 14mm cont suction. Pt tolerated with mod amt discomfort. Plan for bedside nurses to change on M/W/F.   WOC ostomy follow up Stoma type/location: [place] intact to RLQ.  Stomal assessment/size: 1 1/4 inches, even with skin level. Red and moist.  Peristomal assessment: Intact Output: Moderate amount of liquid brown stool.  Ostomy pouching: 2pc.with barrier ring to maintain seal. Education provided: Wife was able to apply pouch and barrier ring without assistance and was able to open and close Velcro to empty pouch. Supplies are ordered at the bedside for wife and staff nurses. Will continue education sessions. Placed on [place] discharge program and discussed ordering supplies after discharge. Julien Girt MSN, RN, [place], [place], [place]

## 2014-12-13 NOTE — Progress Notes (Signed)
Progress Note   [name] KPT:[id number] DOB: Sep 26, 1970 DOA: 12/04/2014 PCP: No PCP Per Patient   Brief Narrative:   Vincent Black is an 44 y.o. male with a PMH of anxiety, ankylosing spondylitis, Crohn's disease and Clostridium difficile colitis who was admitted 12/04/14 the chief complaint of worsening abdominal pain, found to have a perforated sigmoid colon and perforated cecal toxic megacolon as well as small bowel fistula. He underwent exploratory laparotomy/small bowel resection with anastomosis and total abdominal colectomy with end ileostomy on 12/04/14.  Assessment/Plan:   Principal Problem: Septic shock secondary to Perforated sigmoid colon, perforated cecal toxic megacolon, and small bowel fistula in the setting of Crohn's disease  - s/p ex lap and SBR with anastomosis, total abdominal colectomy with end ileostomy (4/25 - Dr. Donne Hazel). - Continue antibiotics per ID recommendations: 2 weeks of therapy with Zosyn/fluconazole/Flagyl recommended. - Wound cultures positive for Candida albicans, continue Diflucan. Blood cultures negative. - GI, surgery and ID following.  Active problems:  Severe protein calorie malnutrition / unintentional weight loss - The patient has lost over 50 pounds in the past 6 months. - Continue nutritional supplements. - Continue Remeron for appetite stimulation.  C.diff colitis  - Continue Flagyl for recommended 2 week course of therapy. - Repeat C diff pending; if negative, ok to come out of isolation if ok with IP.   Sinus tachycardia - Improved with treatment of sepsis.  Acute renal failure/Decreased uop - Prerenal in etiology with significant third spacing of fluid. - Creatinine improving.  Normocytic Anemia - chronic - Transfuse for Hgb < 7.  Thrombocytosis  - Monitoring.  Leukocytosis - Secondary to sepsis, WBC continues to improve.  Acute on chronic pain uncontrolled  - Continue Robaxin, MS Contin and OxyIR for  breakthrough pain.  Anxiety -PRN Ativan.  DVT Prophylaxis - Continue subcutaneous heparin.  Code Status: Full. Family Communication: No family currently at the bedside.  Wilhemena Durie, wife, called at [phone], message left.   Disposition Plan: SNF for rehab when medically stable.   IV Access:    Peripheral IV   Procedures and diagnostic studies:   Dg Chest 1 View  12/04/2014   CLINICAL DATA:  Fever. Nasogastric tube placement. Crohn disease. Anxiety.  EXAM: CHEST  1 VIEW  COMPARISON:  CT of 11/17/2014  FINDINGS: Nasogastric terminates at the body of the stomach. Suspect remote anterior right rib trauma including at the second and possibly the third anterior right ribs. Midline trachea. Normal heart size. No pleural fluid. Low lung volumes. Patchy bibasilar atelectasis. Free intraperitoneal air under both hemidiaphragms.  IMPRESSION: 1. Appropriate position of nasogastric tube. 2. Extensive free intraperitoneal air. Critical test results telephoned toMaggie, r.n. at the time of interpretation at 4:40 p.m.on . 12/04/2014.   Electronically Signed   By: Abigail Miyamoto M.D.   On: 12/04/2014 16:38   Ct Abdomen Pelvis W Contrast  11/17/2014   CLINICAL DATA:  Crohn's disease. Pericolonic abscess drained on 03/18, output about 20 cc a day. Pain in vicinity of drain.  EXAM: CT CHEST, ABDOMEN, AND PELVIS WITH CONTRAST  TECHNIQUE: Multidetector CT imaging of the chest, abdomen and pelvis was performed following the standard protocol during bolus administration of intravenous contrast.  CONTRAST:  181mL OMNIPAQUE IOHEXOL 300 MG/ML  SOLN  COMPARISON:  11/03/2014  FINDINGS: Lower chest:  Stable scarring in both lower lobes.  Hepatobiliary: Stable 7 mm in long axis hypodense lesion in segment 6, image 33 series 2. Gallbladder unremarkable.  Pancreas: Unremarkable  Spleen: Unremarkable  Adrenals/Urinary Tract: Unremarkable  Stomach/Bowel: Small locules of extraluminal gas and mild stranding at the site of the  pigtail catheter adjacent to a loop of small bowel and the sigmoid colon. There was previously an abscess filled with fluid and gas; currently there is only several locules of gas along with inflammatory stranding in this vicinity. Several extraluminal locules of gas are present below the level of the pigtail catheter, for example on image 73 of series 2. There is abnormal wall thickening and wall irregularity in the distal descending colon and sigmoid colon with a full-thickness ulceration of the sigmoid colon wall on image 73 of series 2 anteriorly. Obvious spillage of extraluminal contrast is not seen. The pigtail was formed in the immediate vicinity of a loop of small bowel. Scattered loops of mildly dilated small bowel noted. There stranding around the appendix which is at the upper limits of normal in size.  Vascular/Lymphatic: Small retroperitoneal and mesenteric lymph nodes are probably reactive.  Reproductive: Unremarkable  Other: No supplemental non-categorized findings.  Musculoskeletal: Fused sacroiliac joints as can be encountered in the setting of inflammatory bowel disease or ankylosing spondylitis. Multilevel bridging spurring in the lower thoracic and upper lumbar spine.  IMPRESSION: 1. Although the main body of the abscess has been successfully drained, there continue to be locules of extraluminal gas not only at the pigtail but also along the anterior sigmoid colon where there appears to be a full-thickness ulceration in the colon wall anteriorly along the marked wall thickening. The abnormal wall thickening extends from the descending colon to the rectum but is most severe in the proximal half of the sigmoid colon. There surrounding inflammatory stranding as well. 2. The pigtail was also formed in the immediate vicinity of a loop of small bowel which almost appears to pass through the pigtail loop although this may be misleading. 3. Upper normal wall thickness of the appendix with low-grade  adjacent inflammatory stranding. This is not to a level to be definitive for acute appendicitis but may merit observation. 4. Scattered mildly dilated loops of small bowel with air-fluid levels suggesting ileus. Scattered mesenteric and very rectal lymph nodes are likely inflammatory. 5. 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   Electronically Signed   By: Van Clines M.D.   On: 11/17/2014 11:28   Ir Sinus/fist Tube Chk-non Gi  11/28/2014   CLINICAL DATA:  History of Crohn's disease and colonic perforation with development of an intraperitoneal abscess.  Patient underwent CT-guided percutaneous drainage catheter placement on 10/27/2014 with subsequent CT scan performed 11/17/2014 demonstrating near complete resolution of the abdominal abscess. Contrast injection of the percutaneous drain also performed 11/17/2014 demonstrated a persistent fistulous connection with the adjacent bowel.  Patient returns to the Interventional Radiology Clinic for repeat fluoroscopic guided percutaneous drainage catheter injection. The patient reports minimal to no output from the percutaneous drainage catheter. The patient has not been flushing the percutaneous drain.  EXAM: SINUS TRACT INJECTION/FISTULOGRAM  COMPARISON:  CT abdomen and pelvis- 11/17/2014; 11/03/2014; 10/26/2014; CT-guided percutaneous drainage catheter placement -10/27/2014 ; fluoroscopic guided percutaneous drainage catheter injection- 11/17/2014  CONTRAST:  10 mL OMNIPAQUE IOHEXOL 300 MG/ML SOLN - injected via the existing left lower quadrant percutaneous drainage catheter.  FLUOROSCOPY TIME:  1 minute, 18 seconds (33.8 mGy).  TECHNIQUE: Patient was placed supine on the fluoroscopy table.  A preprocedural spot fluoroscopic image was obtained of the left lower abdominal quadrant and existing percutaneous  drainage catheter.  Multiple spot fluoroscopic and radiographic  images were obtained in various obliquities following the injection of a small amount of contrast via the percutaneous drainage catheter.  Images were reviewed and the procedure was terminated. The drainage catheter was flushed with a small amount of saline and reconnected to a gravity bag. A dressing was placed. The patient tolerated the procedure well without immediate postprocedural complication.  FINDINGS: Preprocedural spot fluoroscopic image demonstrates unchanged positioning of the percutaneous drainage catheter overlying left lower abdominal quadrant.  Contrast injection confirms a persistent fistulous connection with an adjacent loop of small bowel.  As such, the drainage catheter was reconnected to a gravity bag.  IMPRESSION: Persistent fistulous connection from the percutaneous drainage catheter to an adjacent loop of small bowel.  PLAN: Patient will return to the interventional radiology drain Clinic on 12/12/2014 for repeat fluoroscopic guided percutaneous drainage catheter injection. If a fistulous connection persists, surgical revision may have to be considered.   Electronically Signed   By: Sandi Mariscal M.D.   On: 11/28/2014 14:25   Ir Sinus/fist Tube Chk-non Gi  11/17/2014   CLINICAL DATA:  COLONIC DIVERTICULAR ABSCESS, STATUS POST PERCUTANEOUS DRAINAGE 10/27/2014  EXAM: FLUOROSCOPIC INJECTION OF THE EXISTING LEFT LOWER QUADRANT ABSCESS DRAIN  Date:  4/8/20164/03/2015 12:38 pm  Radiologist:  M. Daryll Brod, MD  MEDICATIONS AND MEDICAL HISTORY: NONE.  COMPLICATIONS: NONE IMMEDIATE  PROCEDURE: Informed consent was obtained from the patient following explanation of the procedure, risks, benefits and alternatives. The patient understands, agrees and consents for the procedure. All questions were addressed. A time out was performed.  UNDER STERILE CONDITIONS, THE EXISTING DRAIN CATHETER WAS INJECTED WITH CONTRAST UNDER FLUOROSCOPY. IMAGES OBTAINED FOR DOCUMENTATION. THE ABSCESS CAVITY IS COLLAPSED. THIS  CORRELATES WITH THE CT. THERE IS A SMALL ADJACENT FISTULA FROM THE COLLAPSED ABSCESS CAVITY TO AN ADJACENT SMALL BOWEL LOOP.  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  PLAN: Exchange suction bulb for a gravity bag. Outpatient repeat injection on 11/28/2014   Electronically Signed   By: Jerilynn Mages.  Shick M.D.   On: 11/17/2014 12:49   Dg Chest Port 1 View  12/07/2014   CLINICAL DATA:  Pulmonary edema.  EXAM: PORTABLE CHEST - 1 VIEW  COMPARISON:  12/06/2014  FINDINGS: Low lung volumes with bibasilar atelectasis and vascular congestion, similar to prior study. Lucency under the right hemidiaphragm again noted, possibly pneumoperitoneum. NG tube and right central line remain in place, unchanged.  IMPRESSION: Low lung volumes with bibasilar atelectasis.  Lucency under the right hemidiaphragm again noted, possibly pneumoperitoneum related to recent surgery. May consider decubitus view of the abdomen to assess degree of pneumoperitoneum if felt clinically indicated.   Electronically Signed   By: Rolm Baptise M.D.   On: 12/07/2014 08:50   Dg Chest Port 1 View  12/06/2014   CLINICAL DATA:  Edema  EXAM: PORTABLE CHEST - 1 VIEW  COMPARISON:  12/04/2014  FINDINGS: Low lung volumes with vascular crowding. Possible mild interstitial edema with bibasilar atelectasis. No pneumothorax.  The heart is normal in size.  Right IJ venous catheter terminates at the cavoatrial junction.  Enteric tube traverses in the distal gastric antrum.  Lucency beneath the right hemidiaphragm could suggest free air related to recent surgery.  IMPRESSION: Low lung volumes with vascular crowding and possible mild interstitial edema.  Bibasilar atelectasis.  Support apparatus as above.   Electronically Signed   By: Henderson Newcomer.D.  On: 12/06/2014 09:45   Dg Chest Port 1 View  12/04/2014   CLINICAL DATA:  Central line and NG tube placement.  EXAM: PORTABLE CHEST - 1 VIEW   COMPARISON:  Earlier same day  FINDINGS: Grossly unchanged cardiac silhouette and mediastinal contours. Interval placement of a right jugular approach intravenous catheter with tip projected of the superior cavoatrial junction. Enteric tube tip and side port projects over the expected location of the gastric antrum. No pneumothorax. Apparent resolution of previously noted pneumoperitoneum. Improved aeration of lung bases without focal airspace opacity. No pleural effusion or pneumothorax. Unchanged bones.  IMPRESSION: 1. Appropriately positioned support apparatus as above. No pneumothorax. 2. Apparent resolution of previously noted large amount of pneumoperitoneum. 3. Improved aeration lungs with resolved bibasilar atelectasis.   Electronically Signed   By: Sandi Mariscal M.D.   On: 12/04/2014 21:19   Dg Abd Portable 1v  12/07/2014   CLINICAL DATA:  Nasogastric tube placement.  Initial encounter.  EXAM: PORTABLE ABDOMEN - 1 VIEW  COMPARISON:  Abdominal radiograph performed 12/04/2014  FINDINGS: The patient's enteric tube is noted ending overlying the body of the stomach.  The visualized bowel gas pattern is unremarkable. A bowel suture line is noted at the left lower quadrant. An ostomy is seen at the right lower quadrant. Scattered air and stool filled loops of colon are seen; no abnormal dilatation of small bowel loops is seen to suggest small bowel obstruction. No free intra-abdominal air is identified, though evaluation for free air is limited on a single supine view.  The visualized osseous structures are within normal limits; the sacroiliac joints are unremarkable in appearance.  A drainage catheter is noted overlying the upper pelvis.  IMPRESSION: 1. Enteric tube noted ending overlying the body of the stomach. 2. Unremarkable bowel gas pattern; postoperative change at the lower quadrants. No free intra-abdominal air seen.   Electronically Signed   By: Garald Balding M.D.   On: 12/07/2014 06:32   Dg Abd  Portable 1v  12/04/2014   CLINICAL DATA:  Nasogastric tube placement.  EXAM: PORTABLE ABDOMEN - 1 VIEW  COMPARISON:  Abdominal CT 11/17/2014  FINDINGS: There is large pneumoperitoneum with Rigler's sign and outlining of the falciform ligament. The colon is distended with high-density material, likely retained contrast from comparison CT. Small bowel dilatation.  Nasogastric tube is in good position. There is a left lower quadrant percutaneous abscess drain.  Critical Value/emergent results were called by telephone at the time of interpretation on 12/04/2014 at 4:50 pm to Dr. [name] , who verbally acknowledged these results.  IMPRESSION: 1. Large pneumoperitoneum consistent with bowel perforation. 2. Small bowel and colonic distention which could be from low colonic obstruction or ileus. 3. Nasogastric tube is in good position. 4. Left lower quadrant percutaneous abscess drain.   Electronically Signed   By: Monte Fantasia M.D.   On: 12/04/2014 16:51     Medical Consultants:    Meredith Staggers, MD, Physical Medicine and Rehabilitation  Carol Ada, MD, Gastroenterology  Simonne Maffucci, MD, PCCM  Rolm Bookbinder, MD, General Surgery   Anti-Infectives:   Total days of antibiotics: 8  C diff therapy: day 7 (po vanco --> flagyl)  Zosyn 4/25 > complete 2 week course per ID  Flagyl 4/26 > complete 4 week course per ID  Fluconazole 4/27 > complete 2 week course per ID  Received mycafungin prior  Subjective:   EFOSA TREICHLER continues to report significant abdominal pain. Appetite poor but tells me  he is drinking some of the supplements. No nausea or vomiting. Ostomy draining brown stool.  Objective:    Filed Vitals:   12/12/14 1200 12/12/14 1600 12/12/14 2155 12/13/14 0518  BP: 146/98 164/98 138/100 139/107  Pulse: 101 100 97 99  Temp:  98.2 F (36.8 C) 98.5 F (36.9 C) 98.4 F (36.9 C)  TempSrc:  Oral Oral Oral  Resp: 26 16 18 16   Height:   5\' 9"  (1.753 m)   Weight:    77.3 kg (170 lb 6.7 oz)   SpO2: 97% 98% 97% 99%    Intake/Output Summary (Last 24 hours) at 12/13/14 0746 Last data filed at 12/13/14 0630  Gross per 24 hour  Intake   1700 ml  Output   1420 ml  Net    280 ml    Exam: Gen:  NAD, weak Cardiovascular:  Mildly tachycardic, No M/R/G Respiratory:  Lungs CTAB Gastrointestinal:  Abdomen soft, midline abdominal incision with VAC wound in place, healing well, ostomy right lower quadrant with brown stool present Extremities:  1-2 plus pitting edema bilaterally   Data Reviewed:    Labs: Basic Metabolic Panel:  Recent Labs Lab 12/06/14 1830 12/07/14 0746 12/07/14 1800 12/09/14 0418 12/10/14 0445 12/11/14 0527 12/12/14 0356 12/13/14 0430  NA 132* 134* 131* 135 137 139 139 141  K 3.8 3.5 3.3* 3.0* 3.6 3.7 4.2 4.9  CL 107 105 105 105 108 111 111 111  CO2 16* 18* 19 22 22 23  20* 20*  GLUCOSE 123* 82 86 69* 47* 128* 111* 83  BUN 27* 28* 29* 27* 22* 22* 19 20  CREATININE 2.49* 2.73* 2.40* 2.34* 2.03* 1.82* 1.68* 1.65*  CALCIUM 6.8* 6.8* 6.7* 6.8* 6.8* 6.9* 6.9* 7.0*  MG 2.6* 2.3 2.3 2.2  --   --   --  1.6*  PHOS 4.2 4.1 3.9 3.2  --   --   --  2.7   GFR Estimated Creatinine Clearance: 58.3 mL/min (by C-G formula based on Cr of 1.65). Liver Function Tests:  Recent Labs Lab 12/07/14 0445 12/08/14 0703 12/09/14 0418 12/13/14 0430  AST 16 18 20 28   ALT 11 10 10  10*  ALKPHOS 78 92 109 790*  BILITOT 0.6 0.5 0.6 0.4  PROT 3.7* 3.8* 3.9* 4.6*  ALBUMIN <1.0* <1.0* <1.0* <1.0*   CBC:  Recent Labs Lab 12/06/14 1830 12/07/14 0746 12/07/14 1800 12/08/14 0800 12/09/14 0418 12/10/14 0445 12/13/14 0430  WBC 60.9* 48.7* 43.2* 35.6* 26.7* 38.2* 21.9*  NEUTROABS 57.9* 45.7* 38.9* 32.4*  --   --  17.7*  HGB 8.1* 7.1* 7.0* 7.2* 7.0* 7.5* 7.0*  HCT 25.8* 22.1* 21.7* 22.5* 22.2* 23.9* 23.1*  MCV 87.5 85.7 86.5 85.2 86.0 86.3 91.3  PLT 280 248 245 276 265 321 459*   CBG:  Recent Labs Lab 12/11/14 2045 12/12/14 0837  12/12/14 1308 12/12/14 1826 12/12/14 2305  GLUCAP 108* 114* 96 97 113*   Microbiology Recent Results (from the past 240 hour(s))  Culture, blood (x 2)     Status: None   Collection Time: 12/04/14  4:50 PM  Result Value Ref Range Status   Specimen Description BLOOD LEFT HAND  Final   Special Requests BOTTLES DRAWN AEROBIC ONLY 2CC  Final   Culture   Final    NO GROWTH 5 DAYS Performed at [facility]    Report Status 12/11/2014 FINAL  Final  Culture, blood (x 2)     Status: None   Collection Time: 12/04/14  4:50  PM  Result Value Ref Range Status   Specimen Description BLOOD RIGHT HAND  Final   Special Requests BOTTLES DRAWN AEROBIC ONLY 5CC  Final   Culture   Final    NO GROWTH 5 DAYS Performed at [facility]    Report Status 12/11/2014 FINAL  Final  MRSA PCR Screening     Status: None   Collection Time: 12/04/14  5:32 PM  Result Value Ref Range Status   MRSA by PCR NEGATIVE NEGATIVE Final    Comment:        The GeneXpert MRSA Assay (FDA approved for NASAL specimens only), is one component of a comprehensive MRSA colonization surveillance program. It is not intended to diagnose MRSA infection nor to guide or monitor treatment for MRSA infections.   Wound culture     Status: None   Collection Time: 12/04/14  7:10 PM  Result Value Ref Range Status   Specimen Description ABDOMEN  Final   Special Requests PATIENT ON FOLLOWING ZINACEF  Final   Gram Stain   Final    FEW WBC PRESENT,BOTH PMN AND MONONUCLEAR NO SQUAMOUS EPITHELIAL CELLS SEEN NO ORGANISMS SEEN Performed at [facility]    Culture   Final    FEW CANDIDA ALBICANS Performed at [facility]    Report Status 12/07/2014 FINAL  Final  Anaerobic culture     Status: None   Collection Time: 12/04/14  7:10 PM  Result Value Ref Range Status   Specimen Description WOUND ABDOMEN  Final   Special Requests PT ON ZINACEF  Final   Gram Stain   Final    FEW WBC PRESENT,BOTH PMN AND  MONONUCLEAR NO SQUAMOUS EPITHELIAL CELLS SEEN NO ORGANISMS SEEN Performed at [facility]    Culture   Final    NO ANAEROBES ISOLATED Performed at [facility]    Report Status 12/09/2014 FINAL  Final  Clostridium Difficile by PCR     Status: Abnormal   Collection Time: 12/12/14 10:19 AM  Result Value Ref Range Status   C difficile by pcr POSITIVE (A) NEGATIVE Final    Comment: CRITICAL RESULT CALLED TO, READ BACK BY AND VERIFIED WITH: Bethel Born RN 12:00 12/12/14 ([email or web address])      Medications:   . antiseptic oral rinse  7 mL Mouth Rinse q12n4p  . dextrose  25 mL Intravenous Once  . feeding supplement (ENSURE ENLIVE)  237 mL Oral BID BM  . feeding supplement (PRO-STAT SUGAR FREE 64)  30 mL Oral BID WC  . fluconazole (DIFLUCAN) IV  400 mg Intravenous Q24H  . heparin subcutaneous  5,000 Units Subcutaneous 3 times per day  . lip balm  1 application Topical BID  . methocarbamol  500 mg Oral TID  . metoprolol tartrate  12.5 mg Oral BID  . metroNIDAZOLE  500 mg Oral 3 times per day  . mirtazapine  15 mg Oral QHS  . morphine  15 mg Oral Q12H  . piperacillin-tazobactam (ZOSYN)  IV  3.375 g Intravenous Q8H  . prednisoLONE acetate  1 drop Both Eyes BID  . saccharomyces boulardii  250 mg Oral BID   Continuous Infusions: . dextrose 5 % and 0.9% NaCl 1,000 mL with potassium chloride 20 mEq infusion 100 mL/hr at 12/12/14 1835    Time spent: 35 minutes.  The patient is medically complex and requires high complexity decision making and coordination of care with multiple specialists.    LOS: 9 days   Minie Roadcap  [facility] Pager [phone]. If unable to reach me by pager, please call my cell phone at [phone].  *Please refer to [email or web address], password [email or web address] to get updated schedule on who will round on this patient, as hospitalists switch teams weekly. If 7PM-7AM, please contact night-coverage at [email or web address], password [email or web address] for any overnight needs.  12/13/2014,  7:46 AM

## 2014-12-13 NOTE — Progress Notes (Signed)
NUTRITION FOLLOW UP  Intervention:    Continue Ensure Enlive po BID, each supplement provides 350 kcal and 20 grams of protein  Continue Prostat liquid protein po 30 ml BID with meals, each supplement provides 100 kcal, 15 grams protein  RD to follow for nutrition care plan  Nutrition Dx:   Malnutrition related to acute illness as evidenced by weight loss and severe muscle mass loss, ongoing  Goal:   Pt to meet >/= 90% of their estimated nutrition needs, progressing   Monitor:   PO & supplemental intake, weight, labs, I/O's  Assessment:   Pt with history of Crohn's disease, several recent admissions in March 2016 after being treated severe C. difficile colitis complicated by pericolonic abscess requiring drain placement. CT abdomen was done on 11/17/2014 and showed abscess cavity collapsed. Pt presented with hematemesis, generalized weakness, right upper quadrant area pain.   Procedure (4/25):  1. Ex lap 2. Small bowel resection with anastomosis 3. Total abdominal colectomy with end ileostomy  Pt has advanced to a soft diet; appetite improving (PO: 25-75%). Per GI, pt is now back on mirtazipine, which helps with appetite and anxiety. Family assists with feeding due to weakness and debility. Surgery reports pt is taking in small amounts of food. He continues to accept Ensure and Prostat supplements; RD to continue to maximize nutritional status.   Pt remains with abdominal wound vac. Noted 100 ml output per doc flowsheets. 75 ml output from RLQ ileostomy.   Plan is to d/c home with home health once medically stable. Pt does not qualify for CIR. CSW following.  Labs reviewed. Cl: 20, BUN/Creat: 20/1.65, Calcium: 7.0, Mg: 1.6. CBGS: 97-113.   Height: Ht Readings from Last 1 Encounters:  12/12/14 5\' 9"  (1.753 m)    Weight Status:   Wt Readings from Last 1 Encounters:  12/12/14 170 lb 6.7 oz (77.3 kg)   12/04/14 147 lb 7.8 oz (66.9 kg)       Re-estimated needs:  Kcal:  1900-2100 Protein: 95-105 grams Fluid: 1.9-2.1 L  Skin: JP drain to mid abdomen, wound vac to medial abdomen  Diet Order: DIET SOFT Room service appropriate?: Yes; Fluid consistency:: Thin   Intake/Output Summary (Last 24 hours) at 12/13/14 1350 Last data filed at 12/13/14 1002  Gross per 24 hour  Intake   1700 ml  Output    920 ml  Net    780 ml    Last BM: 12/13/14   Labs:   Recent Labs Lab 12/07/14 1800 12/09/14 0418  12/11/14 0527 12/12/14 0356 12/13/14 0430  NA 131* 135  < > 139 139 141  K 3.3* 3.0*  < > 3.7 4.2 4.9  CL 105 105  < > 111 111 111  CO2 19 22  < > 23 20* 20*  BUN 29* 27*  < > 22* 19 20  CREATININE 2.40* 2.34*  < > 1.82* 1.68* 1.65*  CALCIUM 6.7* 6.8*  < > 6.9* 6.9* 7.0*  MG 2.3 2.2  --   --   --  1.6*  PHOS 3.9 3.2  --   --   --  2.7  GLUCOSE 86 69*  < > 128* 111* 83  < > = values in this interval not displayed.  CBG (last 3)   Recent Labs  12/12/14 2305 12/13/14 0748 12/13/14 1202  GLUCAP 113* 101* 99    Scheduled Meds: . antiseptic oral rinse  7 mL Mouth Rinse q12n4p  . dextrose  25 mL Intravenous Once  .  feeding supplement (ENSURE ENLIVE)  237 mL Oral BID BM  . feeding supplement (PRO-STAT SUGAR FREE 64)  30 mL Oral BID WC  . fluconazole (DIFLUCAN) IV  400 mg Intravenous Q24H  . heparin subcutaneous  5,000 Units Subcutaneous 3 times per day  . lip balm  1 application Topical BID  . methocarbamol  500 mg Oral TID  . metoprolol tartrate  12.5 mg Oral BID  . metroNIDAZOLE  500 mg Oral 3 times per day  . mirtazapine  15 mg Oral QHS  . morphine  15 mg Oral Q12H  . piperacillin-tazobactam (ZOSYN)  IV  3.375 g Intravenous Q8H  . prednisoLONE acetate  1 drop Both Eyes BID  . saccharomyces boulardii  250 mg Oral BID    Continuous Infusions: . dextrose 5 % and 0.9% NaCl 1,000 mL with potassium chloride 20 mEq infusion 100 mL/hr at 12/13/14 1325    Tyren Dugar A. Jimmye Norman, RD, LDN, CDE Pager: [phone] After hours Pager: [phone]

## 2014-12-13 NOTE — Progress Notes (Signed)
ANTIBIOTIC CONSULT NOTE - FOLLOW UP  Pharmacy Consult for Vanco/Fluconazole Indication: CDiff toxic megacolon + perforation   No Known Allergies  Patient Measurements: Height: 5\' 9"  (175.3 cm) Weight: 170 lb 6.7 oz (77.3 kg) IBW/kg (Calculated) : 70.7 Adjusted Body Weight:   Vital Signs: Temp: 98.4 F (36.9 C) (05/04 1314) Temp Source: Oral (05/04 1314) BP: 130/95 mmHg (05/04 1314) Pulse Rate: 103 (05/04 1314) Intake/Output from previous day: 05/03 0701 - 05/04 0700 In: 1700 [I.V.:1700] Out: 1420 [Urine:1300; Drains:120] Intake/Output from this shift: Total I/O In: 200 [P.O.:200] Out: -   Labs:  Recent Labs  12/11/14 0527 12/12/14 0356 12/13/14 0430  WBC  --   --  21.9*  HGB  --   --  7.0*  PLT  --   --  459*  CREATININE 1.82* 1.68* 1.65*   Estimated Creatinine Clearance: 58.3 mL/min (by C-G formula based on Cr of 1.65). No results for input(s): VANCOTROUGH, VANCOPEAK, VANCORANDOM, GENTTROUGH, GENTPEAK, GENTRANDOM, TOBRATROUGH, TOBRAPEAK, TOBRARND, AMIKACINPEAK, AMIKACINTROU, AMIKACIN in the last 72 hours.   Assessment: Pt is 44 yo male with known history of Crohn's disease, several recent admissions in March 2016. Last with severe C. difficile colitis complicated by pericolonic abscess requiring drain placement on 10/27/14 and positive for Citrobacter and Klebsiella, most recent discharge on 3/30 after being treated for severe sepsis secondary to intra-abd abscess. Returned 12/04/2014 c/o sudden onset of RUQ pain, generalized weakness, and hematemesis, admitted to MICU after ex lap and total colectomy, to begin IV ABX for sepsis.  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 Afebrile, WBC 26.7 >38.2>21.9; SCr 1.65 (slight trend down), Per ID note 5/2: 2 weeks of either po flagyl or po vanco after he completes therapy for his peritonitis. Spoke to Dr. Sherral Hammers 5/3 and  will restart po flagyl  Fluconazole 4/26; 4/27 >> Cipro 4/25 x 1 Zosyn 4/25 >> Micafungin 4/26 x 1 Flagyl 4/26 >> 4/29, 5/3>> Vanc po 4/26 >> 4/27  5/3 Cdiff: POSITIVE 4/25 wound: candida albicans 4/25 blood: neg 4/25 mrsa: neg   Goal of Therapy:  Eradication of infection   Plan:  - Cont Zosyn 3.375 g IV q8h - Increase Fluconazole to 400/day  - Flagyl po q8h   Annalucia Laino S. Alford Highland, PharmD, BCPS Clinical Staff Pharmacist Pager [phone]  Eilene Ghazi Stillinger 12/13/2014,1:37 PM

## 2014-12-13 NOTE — Progress Notes (Signed)
Physical Therapy Treatment Patient Details Name: ZALEN SEQUEIRA MRN: [id number] DOB: [birth year] Today's Date: 12/13/2014    History of Present Illness Pt is 44 yo male with known history of Crohn's disease, several recent admissions in March 2016, discharged on March 1st, 2016 after being treated severe C. difficile colitis complicated by pericolonic abscess requiring drain placement on 10/27/14 and positive for Citrobacter and Klebsiella, most recent discharge on 3/30 after being treated for severe sepsis secondary to intra-abd abscess. Pt has seen Dr. Megan Salon for management of ABX on 11/21/2014, was advised to continue taking antibiotics ciprofloxacin and oral vancomycin until repeat CT abdomen done which was scheduled be done on 12/05/2014. Last CT abdomen was done on 11/17/2014 and showed abscess cavity collapsed. Patient saw Dr. Benson Norway his gastroenterologist earlier in a day for evaluation of sudden onset of hematemesis, generalized weakness, right upper quadrant area pain. Patient explains this started very suddenly, started to throw up massive amounts of gastric content with blood in it. 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    PT Comments    Very painful after VAC dressing change, but willing and requesting to sit up and EOB for a bit; Educated pt on the benefits of simply sitting upright, position changes, and getting OOB to chair  It seems we will have better success with helping Ronalee Belts up if we premedicate for pain and try really hard to see him before VAC dressing change, or on Tues, Thurs, Sat  Follow Up Recommendations  CIR;Supervision/Assistance - 24 hour  Noted insurance has denied payment for CIR -- must consider SNF     Equipment Recommendations  Rolling walker with 5" wheels;3in1 (PT)    Recommendations for Other Services       Precautions / Restrictions Precautions Precautions:  Fall Precaution Comments: JP drain    Mobility  Bed Mobility Overal bed mobility: Needs Assistance Bed Mobility: Rolling;Sidelying to Sit;Sit to Sidelying Rolling: Min assist;+2 for physical assistance Sidelying to sit: Mod assist;+2 for physical assistance     Sit to sidelying: Mod assist General bed mobility comments: VC's for log roll technique, and mod assist to elevate trunk as well as use of pad to get pt to EOB.   Transfers                 General transfer comment: Pt politely declined OOB transfer or sit to stand practice today due to pain  Ambulation/Gait                 Stairs            Wheelchair Mobility    Modified Rankin (Stroke Patients Only)       Balance Overall balance assessment: Needs assistance Sitting-balance support: Bilateral upper extremity supported;Single extremity supported;No upper extremity supported Sitting balance-Leahy Scale: Fair Sitting balance - Comments: initially very painful EOB and requiring Bil UE support; with more time and work, pt was able to sit upright without UE support for approx 1 minute; increased pain                            Cognition Arousal/Alertness: Awake/alert Behavior During Therapy: WFL for tasks assessed/performed Overall Cognitive Status: Within Functional Limits for tasks assessed                      Exercises  General Comments        Pertinent Vitals/Pain Pain Assessment: 0-10 Pain Score: 8  Pain Location: Abdomen Pain Descriptors / Indicators: Aching;Grimacing;Discomfort Pain Intervention(s): Limited activity within patient's tolerance;Monitored during session;Repositioned;Patient requesting pain meds-RN notified    Home Living                      Prior Function            PT Goals (current goals can now be found in the care plan section) Acute Rehab PT Goals Patient Stated Goal: Get better PT Goal Formulation: With  patient/family Time For Goal Achievement: 12/14/14 (Goals set 4/28 continue to be appropriate) Potential to Achieve Goals: Good Progress towards PT goals: Progressing toward goals    Frequency  Min 3X/week    PT Plan Current plan remains appropriate    Co-evaluation             End of Session   Activity Tolerance: Patient limited by fatigue;Patient limited by pain Patient left: in bed;with call bell/phone within reach;with family/visitor present     Time: 1449-1459 PT Time Calculation (min) (ACUTE ONLY): 10 min  Charges:  $Therapeutic Activity: 8-22 mins                    G Codes:      Quin Hoop 12/13/2014, 4:24 PM  Roney Marion, [place] Pager [phone] Office [phone]

## 2014-12-13 NOTE — Progress Notes (Signed)
PT Cancellation Note  Patient Details Name: RAJINDER MESICK MRN: [id number] DOB: 12-10-1970   Cancelled Treatment:    Reason Eval/Treat Not Completed: Other (comment)   Atempted PT session x2 this am; first he requested pain meds prior to getting up, then declined after having dressings changed;   Will follow up later today as time allows;  Otherwise, will follow up for PT tomorrow;   Thank you,  Roney Marion, PT  Acute Rehabilitation Services Pager [phone] Office [phone]     Roney Marion [facility] 12/13/2014, 11:36 AM

## 2014-12-14 DIAGNOSIS — K50919 Crohn's disease, unspecified, with unspecified complications: Secondary | ICD-10-CM

## 2014-12-14 LAB — COMPREHENSIVE METABOLIC PANEL
ALT: 11 U/L — ABNORMAL LOW (ref 17–63)
AST: 34 U/L (ref 15–41)
Alkaline Phosphatase: 749 U/L — ABNORMAL HIGH (ref 38–126)
Anion gap: 7 (ref 5–15)
BUN: 17 mg/dL (ref 6–20)
CALCIUM: 7.1 mg/dL — AB (ref 8.9–10.3)
CO2: 20 mmol/L — AB (ref 22–32)
Chloride: 115 mmol/L — ABNORMAL HIGH (ref 101–111)
Creatinine, Ser: 1.65 mg/dL — ABNORMAL HIGH (ref 0.61–1.24)
GFR calc Af Amer: 58 mL/min — ABNORMAL LOW (ref >60)
GFR calc non Af Amer: 50 mL/min — ABNORMAL LOW (ref >60)
Glucose, Bld: 66 mg/dL — ABNORMAL LOW (ref 70–99)
Potassium: 5.7 mmol/L — ABNORMAL HIGH (ref 3.5–5.1)
Sodium: 142 mmol/L (ref 135–145)
Total Bilirubin: 0.6 mg/dL (ref 0.3–1.2)
Total Protein: 4.7 g/dL — ABNORMAL LOW (ref 6.5–8.1)

## 2014-12-14 LAB — MAGNESIUM: Magnesium: 1.5 mg/dL — ABNORMAL LOW (ref 1.7–2.4)

## 2014-12-14 LAB — CBC WITH DIFFERENTIAL/PLATELET
Basophils Absolute: 0 10*3/uL (ref 0.0–0.1)
Basophils Relative: 0 % (ref 0–1)
EOS ABS: 0.6 10*3/uL (ref 0.0–0.7)
Eosinophils Relative: 3 % (ref 0–5)
HEMATOCRIT: 23.5 % — AB (ref 39.0–52.0)
HEMOGLOBIN: 7 g/dL — AB (ref 13.0–17.0)
LYMPHS ABS: 1.7 10*3/uL (ref 0.7–4.0)
Lymphocytes Relative: 8 % — ABNORMAL LOW (ref 12–46)
MCH: 27.2 pg (ref 26.0–34.0)
MCHC: 29.8 g/dL — AB (ref 30.0–36.0)
MCV: 91.4 fL (ref 78.0–100.0)
Monocytes Absolute: 1 10*3/uL (ref 0.1–1.0)
Monocytes Relative: 5 % (ref 3–12)
Neutro Abs: 17.1 10*3/uL — ABNORMAL HIGH (ref 1.7–7.7)
Neutrophils Relative %: 84 % — ABNORMAL HIGH (ref 43–77)
Platelets: 485 10*3/uL — ABNORMAL HIGH (ref 150–400)
RBC: 2.57 MIL/uL — AB (ref 4.22–5.81)
RDW: 20.1 % — ABNORMAL HIGH (ref 11.5–15.5)
WBC: 20.5 10*3/uL — ABNORMAL HIGH (ref 4.0–10.5)

## 2014-12-14 LAB — PHOSPHORUS: PHOSPHORUS: 3.2 mg/dL (ref 2.5–4.6)

## 2014-12-14 LAB — CLOSTRIDIUM DIFFICILE BY PCR

## 2014-12-14 LAB — GLUCOSE, CAPILLARY
GLUCOSE-CAPILLARY: 98 mg/dL (ref 70–99)
GLUCOSE-CAPILLARY: 101 mg/dL — AB (ref 70–99)
Glucose-Capillary: 92 mg/dL (ref 70–99)
Glucose-Capillary: 110 mg/dL — ABNORMAL HIGH (ref 70–99)

## 2014-12-14 MED ORDER — DEXTROSE-NACL 5-0.9 % IV SOLN
INTRAVENOUS | Status: DC
  Administered 2014-12-14: 1000 mL via INTRAVENOUS
  Administered 2014-12-16 (×2): via INTRAVENOUS
  Administered 2014-12-18: 1000 mL via INTRAVENOUS

## 2014-12-14 MED ORDER — SODIUM POLYSTYRENE SULFONATE 15 GM/60ML PO SUSP
30.0000 g | ORAL | Status: AC
  Administered 2014-12-14: 30 g via ORAL
  Filled 2014-12-14: qty 120

## 2014-12-14 NOTE — Progress Notes (Signed)
Progress Note   Vincent Black PPI:[id number] DOB: 17-Aug-1970 DOA: 12/04/2014 PCP: No PCP Per Patient   Brief Narrative:   Vincent Black is an 44 y.o. male with a PMH of anxiety, ankylosing spondylitis, Crohn's disease and Clostridium difficile colitis who was admitted 12/04/14 the chief complaint of worsening abdominal pain, found to have a perforated sigmoid colon and perforated cecal toxic megacolon as well as small bowel fistula. He underwent exploratory laparotomy/small bowel resection with anastomosis and total abdominal colectomy with end ileostomy on 12/04/14.  Assessment/Plan:   Principal Problem: Septic shock secondary to Perforated sigmoid colon, perforated cecal toxic megacolon, and small bowel fistula in the setting of Crohn's disease  - s/p ex lap and SBR with anastomosis, total abdominal colectomy with end ileostomy (4/25 - Dr. Donne Hazel). - Continue antibiotics per ID recommendations: 2 weeks of therapy with Zosyn/fluconazole/Flagyl recommended. - Due to problems with nausea, Flagyl discontinued early per surgical recommendations. Repeat PCR testing. - Wound cultures positive for Candida albicans, continue Diflucan. Blood cultures negative. - GI, surgery and ID following.  Active problems:  Hyperkalemia - Will give 30 g Kayexalate. Discontinue potassium in IV fluids.  Severe protein calorie malnutrition / unintentional weight loss - The patient has lost over 50 pounds in the past 6 months. - Continue nutritional supplements. - Continue Remeron for appetite stimulation.  C.diff colitis  - Repeat C diff ordered; if negative, ok to come out of isolation if ok with IP.  - Flagyl discontinued by surgery as he's felt to be at low risk for ongoing C. difficile infection with removal of his colon. - C. difficile enteritis still possible, although rare.  Sinus tachycardia - Improved with treatment of sepsis.  Acute renal failure/Decreased uop - Prerenal in etiology  with significant third spacing of fluid. - Creatinine improving.  Normocytic Anemia - chronic - Transfuse for Hgb < 7.  Thrombocytosis  - Monitoring.  Leukocytosis - Secondary to sepsis, WBC continues to improve.  Acute on chronic pain uncontrolled  - Continue Robaxin, MS Contin and OxyIR for breakthrough pain.  Anxiety -PRN Ativan.  DVT Prophylaxis - Continue subcutaneous heparin.  Code Status: Full. Family Communication: Vincent Black, wife, updated at bedside.   Disposition Plan: SNF for rehab when medically stable. Will need PICC line placed for prolonged IV antibiotics.   IV Access:    CVC right internal jugular placed 12/04/14   Procedures and diagnostic studies:   Dg Chest 1 View  12/04/2014   CLINICAL DATA:  Fever. Nasogastric tube placement. Crohn disease. Anxiety.  EXAM: CHEST  1 VIEW  COMPARISON:  CT of 11/17/2014  FINDINGS: Nasogastric terminates at the body of the stomach. Suspect remote anterior right rib trauma including at the second and possibly the third anterior right ribs. Midline trachea. Normal heart size. No pleural fluid. Low lung volumes. Patchy bibasilar atelectasis. Free intraperitoneal air under both hemidiaphragms.  IMPRESSION: 1. Appropriate position of nasogastric tube. 2. Extensive free intraperitoneal air. Critical test results telephoned toMaggie, r.n. at the time of interpretation at 4:40 p.m.on . 12/04/2014.   Electronically Signed   By: Abigail Miyamoto M.D.   On: 12/04/2014 16:38   Ct Abdomen Pelvis W Contrast  11/17/2014   CLINICAL DATA:  Crohn's disease. Pericolonic abscess drained on 03/18, output about 20 cc a day. Pain in vicinity of drain.  EXAM: CT CHEST, ABDOMEN, AND PELVIS WITH CONTRAST  TECHNIQUE: Multidetector CT imaging of the chest, abdomen and pelvis was performed following the standard protocol during  bolus administration of intravenous contrast.  CONTRAST:  144mL OMNIPAQUE IOHEXOL 300 MG/ML  SOLN  COMPARISON:  11/03/2014  FINDINGS: Lower  chest:  Stable scarring in both lower lobes.  Hepatobiliary: Stable 7 mm in long axis hypodense lesion in segment 6, image 33 series 2. Gallbladder unremarkable.  Pancreas: Unremarkable  Spleen: Unremarkable  Adrenals/Urinary Tract: Unremarkable  Stomach/Bowel: Small locules of extraluminal gas and mild stranding at the site of the pigtail catheter adjacent to a loop of small bowel and the sigmoid colon. There was previously an abscess filled with fluid and gas; currently there is only several locules of gas along with inflammatory stranding in this vicinity. Several extraluminal locules of gas are present below the level of the pigtail catheter, for example on image 73 of series 2. There is abnormal wall thickening and wall irregularity in the distal descending colon and sigmoid colon with a full-thickness ulceration of the sigmoid colon wall on image 73 of series 2 anteriorly. Obvious spillage of extraluminal contrast is not seen. The pigtail was formed in the immediate vicinity of a loop of small bowel. Scattered loops of mildly dilated small bowel noted. There stranding around the appendix which is at the upper limits of normal in size.  Vascular/Lymphatic: Small retroperitoneal and mesenteric lymph nodes are probably reactive.  Reproductive: Unremarkable  Other: No supplemental non-categorized findings.  Musculoskeletal: Fused sacroiliac joints as can be encountered in the setting of inflammatory bowel disease or ankylosing spondylitis. Multilevel bridging spurring in the lower thoracic and upper lumbar spine.  IMPRESSION: 1. Although the main body of the abscess has been successfully drained, there continue to be locules of extraluminal gas not only at the pigtail but also along the anterior sigmoid colon where there appears to be a full-thickness ulceration in the colon wall anteriorly along the marked wall thickening. The abnormal wall thickening extends from the descending colon to the rectum but is most  severe in the proximal half of the sigmoid colon. There surrounding inflammatory stranding as well. 2. The pigtail was also formed in the immediate vicinity of a loop of small bowel which almost appears to pass through the pigtail loop although this may be misleading. 3. Upper normal wall thickness of the appendix with low-grade adjacent inflammatory stranding. This is not to a level to be definitive for acute appendicitis but may merit observation. 4. Scattered mildly dilated loops of small bowel with air-fluid levels suggesting ileus. Scattered mesenteric and very rectal lymph nodes are likely inflammatory. 5. 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   Electronically Signed   By: Van Clines M.D.   On: 11/17/2014 11:28   Ir Sinus/fist Tube Chk-non Gi  11/28/2014   CLINICAL DATA:  History of Crohn's disease and colonic perforation with development of an intraperitoneal abscess.  Patient underwent CT-guided percutaneous drainage catheter placement on 10/27/2014 with subsequent CT scan performed 11/17/2014 demonstrating near complete resolution of the abdominal abscess. Contrast injection of the percutaneous drain also performed 11/17/2014 demonstrated a persistent fistulous connection with the adjacent bowel.  Patient returns to the Interventional Radiology Clinic for repeat fluoroscopic guided percutaneous drainage catheter injection. The patient reports minimal to no output from the percutaneous drainage catheter. The patient has not been flushing the percutaneous drain.  EXAM: SINUS TRACT INJECTION/FISTULOGRAM  COMPARISON:  CT abdomen and pelvis- 11/17/2014; 11/03/2014; 10/26/2014; CT-guided percutaneous drainage catheter placement -10/27/2014 ; fluoroscopic guided percutaneous drainage catheter injection- 11/17/2014  CONTRAST:  10 mL  OMNIPAQUE IOHEXOL 300 MG/ML SOLN - injected via the existing left lower quadrant  percutaneous drainage catheter.  FLUOROSCOPY TIME:  1 minute, 18 seconds (33.8 mGy).  TECHNIQUE: Patient was placed supine on the fluoroscopy table.  A preprocedural spot fluoroscopic image was obtained of the left lower abdominal quadrant and existing percutaneous drainage catheter.  Multiple spot fluoroscopic and radiographic images were obtained in various obliquities following the injection of a small amount of contrast via the percutaneous drainage catheter.  Images were reviewed and the procedure was terminated. The drainage catheter was flushed with a small amount of saline and reconnected to a gravity bag. A dressing was placed. The patient tolerated the procedure well without immediate postprocedural complication.  FINDINGS: Preprocedural spot fluoroscopic image demonstrates unchanged positioning of the percutaneous drainage catheter overlying left lower abdominal quadrant.  Contrast injection confirms a persistent fistulous connection with an adjacent loop of small bowel.  As such, the drainage catheter was reconnected to a gravity bag.  IMPRESSION: Persistent fistulous connection from the percutaneous drainage catheter to an adjacent loop of small bowel.  PLAN: Patient will return to the interventional radiology drain Clinic on 12/12/2014 for repeat fluoroscopic guided percutaneous drainage catheter injection. If a fistulous connection persists, surgical revision may have to be considered.   Electronically Signed   By: Sandi Mariscal M.D.   On: 11/28/2014 14:25   Ir Sinus/fist Tube Chk-non Gi  11/17/2014   CLINICAL DATA:  COLONIC DIVERTICULAR ABSCESS, STATUS POST PERCUTANEOUS DRAINAGE 10/27/2014  EXAM: FLUOROSCOPIC INJECTION OF THE EXISTING LEFT LOWER QUADRANT ABSCESS DRAIN  Date:  4/8/20164/03/2015 12:38 pm  Radiologist:  M. Daryll Brod, MD  MEDICATIONS AND MEDICAL HISTORY: NONE.  COMPLICATIONS: NONE IMMEDIATE  PROCEDURE: Informed consent was obtained from the patient following explanation of the  procedure, risks, benefits and alternatives. The patient understands, agrees and consents for the procedure. All questions were addressed. A time out was performed.  UNDER STERILE CONDITIONS, THE EXISTING DRAIN CATHETER WAS INJECTED WITH CONTRAST UNDER FLUOROSCOPY. IMAGES OBTAINED FOR DOCUMENTATION. THE ABSCESS CAVITY IS COLLAPSED. THIS CORRELATES WITH THE CT. THERE IS A SMALL ADJACENT FISTULA FROM THE COLLAPSED ABSCESS CAVITY TO AN ADJACENT SMALL BOWEL LOOP.  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  PLAN: Exchange suction bulb for a gravity bag. Outpatient repeat injection on 11/28/2014   Electronically Signed   By: Jerilynn Mages.  Shick M.D.   On: 11/17/2014 12:49   Dg Chest Port 1 View  12/07/2014   CLINICAL DATA:  Pulmonary edema.  EXAM: PORTABLE CHEST - 1 VIEW  COMPARISON:  12/06/2014  FINDINGS: Low lung volumes with bibasilar atelectasis and vascular congestion, similar to prior study. Lucency under the right hemidiaphragm again noted, possibly pneumoperitoneum. NG tube and right central line remain in place, unchanged.  IMPRESSION: Low lung volumes with bibasilar atelectasis.  Lucency under the right hemidiaphragm again noted, possibly pneumoperitoneum related to recent surgery. May consider decubitus view of the abdomen to assess degree of pneumoperitoneum if felt clinically indicated.   Electronically Signed   By: Rolm Baptise M.D.   On: 12/07/2014 08:50   Dg Chest Port 1 View  12/06/2014   CLINICAL DATA:  Edema  EXAM: PORTABLE CHEST - 1 VIEW  COMPARISON:  12/04/2014  FINDINGS: Low lung volumes with vascular crowding. Possible mild interstitial edema with bibasilar atelectasis. No pneumothorax.  The heart is normal in size.  Right IJ venous catheter terminates at the cavoatrial junction.  Enteric tube  traverses in the distal gastric antrum.  Lucency beneath the right hemidiaphragm could suggest free air related to recent surgery.   IMPRESSION: Low lung volumes with vascular crowding and possible mild interstitial edema.  Bibasilar atelectasis.  Support apparatus as above.   Electronically Signed   By: Julian Hy M.D.   On: 12/06/2014 09:45   Dg Chest Port 1 View  12/04/2014   CLINICAL DATA:  Central line and NG tube placement.  EXAM: PORTABLE CHEST - 1 VIEW  COMPARISON:  Earlier same day  FINDINGS: Grossly unchanged cardiac silhouette and mediastinal contours. Interval placement of a right jugular approach intravenous catheter with tip projected of the superior cavoatrial junction. Enteric tube tip and side port projects over the expected location of the gastric antrum. No pneumothorax. Apparent resolution of previously noted pneumoperitoneum. Improved aeration of lung bases without focal airspace opacity. No pleural effusion or pneumothorax. Unchanged bones.  IMPRESSION: 1. Appropriately positioned support apparatus as above. No pneumothorax. 2. Apparent resolution of previously noted large amount of pneumoperitoneum. 3. Improved aeration lungs with resolved bibasilar atelectasis.   Electronically Signed   By: Sandi Mariscal M.D.   On: 12/04/2014 21:19   Dg Abd Portable 1v  12/07/2014   CLINICAL DATA:  Nasogastric tube placement.  Initial encounter.  EXAM: PORTABLE ABDOMEN - 1 VIEW  COMPARISON:  Abdominal radiograph performed 12/04/2014  FINDINGS: The patient's enteric tube is noted ending overlying the body of the stomach.  The visualized bowel gas pattern is unremarkable. A bowel suture line is noted at the left lower quadrant. An ostomy is seen at the right lower quadrant. Scattered air and stool filled loops of colon are seen; no abnormal dilatation of small bowel loops is seen to suggest small bowel obstruction. No free intra-abdominal air is identified, though evaluation for free air is limited on a single supine view.  The visualized osseous structures are within normal limits; the sacroiliac joints are unremarkable in  appearance.  A drainage catheter is noted overlying the upper pelvis.  IMPRESSION: 1. Enteric tube noted ending overlying the body of the stomach. 2. Unremarkable bowel gas pattern; postoperative change at the lower quadrants. No free intra-abdominal air seen.   Electronically Signed   By: Garald Balding M.D.   On: 12/07/2014 06:32   Dg Abd Portable 1v  12/04/2014   CLINICAL DATA:  Nasogastric tube placement.  EXAM: PORTABLE ABDOMEN - 1 VIEW  COMPARISON:  Abdominal CT 11/17/2014  FINDINGS: There is large pneumoperitoneum with Rigler's sign and outlining of the falciform ligament. The colon is distended with high-density material, likely retained contrast from comparison CT. Small bowel dilatation.  Nasogastric tube is in good position. There is a left lower quadrant percutaneous abscess drain.  Critical Value/emergent results were called by telephone at the time of interpretation on 12/04/2014 at 4:50 pm to Dr. [name] , who verbally acknowledged these results.  IMPRESSION: 1. Large pneumoperitoneum consistent with bowel perforation. 2. Small bowel and colonic distention which could be from low colonic obstruction or ileus. 3. Nasogastric tube is in good position. 4. Left lower quadrant percutaneous abscess drain.   Electronically Signed   By: Monte Fantasia M.D.   On: 12/04/2014 16:51     Medical Consultants:    Meredith Staggers, MD, Physical Medicine and Rehabilitation  Carol Ada, MD, Gastroenterology  Simonne Maffucci, MD, PCCM  Rolm Bookbinder, MD, General Surgery   Anti-Infectives:   Total days of antibiotics: 8  C diff therapy: day 7 (po vanco -->  flagyl)  Zosyn 4/25 > complete 2 week course per ID  Flagyl 4/26 > 12/14/14  Fluconazole 4/27 > complete 2 week course per ID  Received mycafungin prior to admission  Subjective:   Vincent Black continues to report significant abdominal pain and had some nausea today. Appetite remains poor continues to drink Ensure  supplements. No vomiting. Ostomy draining brown stool.  Objective:    Filed Vitals:   12/13/14 0940 12/13/14 1314 12/13/14 2106 12/14/14 0538  BP: 140/82 130/95 144/103 146/104  Pulse: 98 103 106 96  Temp:  98.4 F (36.9 C) 98.6 F (37 C) 97.7 F (36.5 C)  TempSrc:  Oral Oral Oral  Resp:  18 18 18   Height:      Weight:      SpO2:  98% 98% 98%    Intake/Output Summary (Last 24 hours) at 12/14/14 0848 Last data filed at 12/14/14 0512  Gross per 24 hour  Intake    720 ml  Output      0 ml  Net    720 ml    Exam: Gen:  NAD, weak Cardiovascular:  Mildly tachycardic, No M/R/G Respiratory:  Lungs CTAB Gastrointestinal:  Abdomen soft, midline abdominal incision with VAC wound in place, healing well, ostomy right lower quadrant with brown stool present Extremities:  1-2 plus pitting edema bilaterally   Data Reviewed:    Labs: Basic Metabolic Panel:  Recent Labs Lab 12/07/14 1800 12/09/14 0418 12/10/14 0445 12/11/14 0527 12/12/14 0356 12/13/14 0430 12/14/14 0505  NA 131* 135 137 139 139 141 142  K 3.3* 3.0* 3.6 3.7 4.2 4.9 5.7*  CL 105 105 108 111 111 111 115*  CO2 19 22 22 23  20* 20* 20*  GLUCOSE 86 69* 47* 128* 111* 83 66*  BUN 29* 27* 22* 22* 19 20 17   CREATININE 2.40* 2.34* 2.03* 1.82* 1.68* 1.65* 1.65*  CALCIUM 6.7* 6.8* 6.8* 6.9* 6.9* 7.0* 7.1*  MG 2.3 2.2  --   --   --  1.6* 1.5*  PHOS 3.9 3.2  --   --   --  2.7 3.2   GFR Estimated Creatinine Clearance: 58.3 mL/min (by C-G formula based on Cr of 1.65). Liver Function Tests:  Recent Labs Lab 12/08/14 0703 12/09/14 0418 12/13/14 0430 12/14/14 0505  AST 18 20 28  34  ALT 10 10 10* 11*  ALKPHOS 92 109 790* 749*  BILITOT 0.5 0.6 0.4 0.6  PROT 3.8* 3.9* 4.6* 4.7*  ALBUMIN <1.0* <1.0* <1.0* PENDING   CBC:  Recent Labs Lab 12/07/14 1800 12/08/14 0800 12/09/14 0418 12/10/14 0445 12/13/14 0430 12/14/14 0505  WBC 43.2* 35.6* 26.7* 38.2* 21.9* 20.5*  NEUTROABS 38.9* 32.4*  --   --  17.7* 17.1*   HGB 7.0* 7.2* 7.0* 7.5* 7.0* 7.0*  HCT 21.7* 22.5* 22.2* 23.9* 23.1* 23.5*  MCV 86.5 85.2 86.0 86.3 91.3 91.4  PLT 245 276 265 321 459* 485*   CBG:  Recent Labs Lab 12/12/14 2305 12/13/14 0748 12/13/14 1202 12/13/14 1702 12/13/14 2151  GLUCAP 113* 101* 99 102* 29   Microbiology Recent Results (from the past 240 hour(s))  Culture, blood (x 2)     Status: None   Collection Time: 12/04/14  4:50 PM  Result Value Ref Range Status   Specimen Description BLOOD LEFT HAND  Final   Special Requests BOTTLES DRAWN AEROBIC ONLY 2CC  Final   Culture   Final    NO GROWTH 5 DAYS Performed at [facility]  Report Status 12/11/2014 FINAL  Final  Culture, blood (x 2)     Status: None   Collection Time: 12/04/14  4:50 PM  Result Value Ref Range Status   Specimen Description BLOOD RIGHT HAND  Final   Special Requests BOTTLES DRAWN AEROBIC ONLY 5CC  Final   Culture   Final    NO GROWTH 5 DAYS Performed at [facility]    Report Status 12/11/2014 FINAL  Final  MRSA PCR Screening     Status: None   Collection Time: 12/04/14  5:32 PM  Result Value Ref Range Status   MRSA by PCR NEGATIVE NEGATIVE Final    Comment:        The GeneXpert MRSA Assay (FDA approved for NASAL specimens only), is one component of a comprehensive MRSA colonization surveillance program. It is not intended to diagnose MRSA infection nor to guide or monitor treatment for MRSA infections.   Wound culture     Status: None   Collection Time: 12/04/14  7:10 PM  Result Value Ref Range Status   Specimen Description ABDOMEN  Final   Special Requests PATIENT ON FOLLOWING ZINACEF  Final   Gram Stain   Final    FEW WBC PRESENT,BOTH PMN AND MONONUCLEAR NO SQUAMOUS EPITHELIAL CELLS SEEN NO ORGANISMS SEEN Performed at [facility]    Culture   Final    FEW CANDIDA ALBICANS Performed at [facility]    Report Status 12/07/2014 FINAL  Final  Anaerobic culture     Status: None    Collection Time: 12/04/14  7:10 PM  Result Value Ref Range Status   Specimen Description WOUND ABDOMEN  Final   Special Requests PT ON ZINACEF  Final   Gram Stain   Final    FEW WBC PRESENT,BOTH PMN AND MONONUCLEAR NO SQUAMOUS EPITHELIAL CELLS SEEN NO ORGANISMS SEEN Performed at [facility]    Culture   Final    NO ANAEROBES ISOLATED Performed at [facility]    Report Status 12/09/2014 FINAL  Final  Clostridium Difficile by PCR     Status: Abnormal   Collection Time: 12/12/14 10:19 AM  Result Value Ref Range Status   C difficile by pcr POSITIVE (A) NEGATIVE Final    Comment: CRITICAL RESULT CALLED TO, READ BACK BY AND VERIFIED WITH: Bethel Born RN 12:00 12/12/14 ([email or web address])      Medications:   . antiseptic oral rinse  7 mL Mouth Rinse q12n4p  . dextrose  25 mL Intravenous Once  . feeding supplement (ENSURE ENLIVE)  237 mL Oral BID BM  . feeding supplement (PRO-STAT SUGAR FREE 64)  30 mL Oral BID WC  . fluconazole (DIFLUCAN) IV  400 mg Intravenous Q24H  . heparin subcutaneous  5,000 Units Subcutaneous 3 times per day  . lip balm  1 application Topical BID  . methocarbamol  500 mg Oral TID  . metoprolol tartrate  12.5 mg Oral BID  . metroNIDAZOLE  500 mg Oral 3 times per day  . mirtazapine  15 mg Oral QHS  . morphine  15 mg Oral Q12H  . piperacillin-tazobactam (ZOSYN)  IV  3.375 g Intravenous Q8H  . prednisoLONE acetate  1 drop Both Eyes BID  . saccharomyces boulardii  250 mg Oral BID   Continuous Infusions: . dextrose 5 % and 0.9% NaCl 1,000 mL with potassium chloride 20 mEq infusion 100 mL/hr at 12/14/14 0101    Time spent: 35 minutes.  The patient is medically complex  and requires high complexity decision making and coordination of care with multiple specialists.    LOS: 10 days   [facility] Pager [phone]. If unable to reach me by pager, please call my cell phone at [phone].  *Please refer to [email or web address], password [email or web address]  to get updated schedule on who will round on this patient, as hospitalists switch teams weekly. If 7PM-7AM, please contact night-coverage at [email or web address], password [email or web address] for any overnight needs.  12/14/2014, 8:48 AM

## 2014-12-14 NOTE — Progress Notes (Signed)
Patient ID: Vincent Black, male   DOB: Feb 17, 1971, 44 y.o.   MRN: [id number]  He continues to remain nauseated with flagyl Will d/c as he no longer has a colon Will remove JP soon

## 2014-12-14 NOTE — Progress Notes (Signed)
All necessary forms faxed to [facility] at [facility] for wound vac follow up. [facility] notified of pt's expectant discharge and currently following. If pt dc'd over the weekend, no further CM set up necessary. Pt updated.

## 2014-12-14 NOTE — Clinical Social Work Note (Signed)
Clinical Social Worker continuing to follow patient and family for support and discharge planning needs.  CSW spoke with patient at bedside who confirms that he will return home with family at discharge.  Patient family plans to provide 24 hour support at home.  CM to arrange home health and wound vac prior to discharge.  CSW to arrange ambulance transport at discharge - confirmed address is correct in EPIC.  Please notify CSW at time of discharge to arrange transport.  Barbette Or, [place]

## 2014-12-14 NOTE — Progress Notes (Signed)
Patient ID: Vincent Black, male   DOB: 03-30-1971, 44 y.o.   MRN: [id number] 10 Days Post-Op  Subjective: Pt feels better with pain med adjustment.  Eating a little better.  Flagyl making him nauseated.    Objective: Vital signs in last 24 hours: Temp:  [97.7 F (36.5 C)-98.6 F (37 C)] 97.7 F (36.5 C) (05/05 0538) Pulse Rate:  [96-106] 96 (05/05 0538) Resp:  [18] 18 (05/05 0538) BP: (130-146)/(95-104) 146/104 mmHg (05/05 0538) SpO2:  [98 %] 98 % (05/05 0538) Last BM Date: 12/13/14  Intake/Output from previous day: 05/04 0701 - 05/05 0700 In: 720 [P.O.:700; I.V.:20] Out: -  Intake/Output this shift: Total I/O In: 200 [P.O.:200] Out: 725 [Urine:725]  PE: Abd: soft, tender appropriately, VAC in place with serous output, +BS, ostomy with good output, stoma is pink and healthy, JP drain with minimal serous output Heart: regular Lungs: CTAB  Lab Results:   Recent Labs  12/13/14 0430 12/14/14 0505  WBC 21.9* 20.5*  HGB 7.0* 7.0*  HCT 23.1* 23.5*  PLT 459* 485*   BMET  Recent Labs  12/13/14 0430 12/14/14 0505  NA 141 142  K 4.9 5.7*  CL 111 115*  CO2 20* 20*  GLUCOSE 83 66*  BUN 20 17  CREATININE 1.65* 1.65*  CALCIUM 7.0* 7.1*   PT/INR No results for input(s): LABPROT, INR in the last 72 hours. CMP     Component Value Date/Time   NA 142 12/14/2014 0505   K 5.7* 12/14/2014 0505   CL 115* 12/14/2014 0505   CO2 20* 12/14/2014 0505   GLUCOSE 66* 12/14/2014 0505   BUN 17 12/14/2014 0505   CREATININE 1.65* 12/14/2014 0505   CALCIUM 7.1* 12/14/2014 0505   PROT 4.7* 12/14/2014 0505   ALBUMIN <1.0* 12/14/2014 0505   AST 34 12/14/2014 0505   ALT 11* 12/14/2014 0505   ALKPHOS 749* 12/14/2014 0505   BILITOT 0.6 12/14/2014 0505   GFRNONAA 50* 12/14/2014 0505   GFRAA 58* 12/14/2014 0505   Lipase  No results found for: LIPASE     Studies/Results: No results found.  Anti-infectives: Anti-infectives    Start     Dose/Rate Route Frequency Ordered Stop    12/12/14 1400  metroNIDAZOLE (FLAGYL) tablet 500 mg     500 mg Oral 3 times per day 12/12/14 1011     12/12/14 1300  fluconazole (DIFLUCAN) IVPB 400 mg     400 mg 100 mL/hr over 120 Minutes Intravenous Every 24 hours 12/12/14 1015     12/07/14 1300  fluconazole (DIFLUCAN) IVPB 200 mg  Status:  Discontinued     200 mg 100 mL/hr over 60 Minutes Intravenous Every 24 hours 12/07/14 1102 12/12/14 1015   12/06/14 1300  fluconazole (DIFLUCAN) IVPB 400 mg  Status:  Discontinued     400 mg 100 mL/hr over 120 Minutes Intravenous Every 24 hours 12/06/14 1119 12/07/14 1102   12/06/14 0000  micafungin (MYCAMINE) 100 mg in sodium chloride 0.9 % 100 mL IVPB  Status:  Discontinued     100 mg 100 mL/hr over 1 Hours Intravenous Daily 12/05/14 1108 12/05/14 1332   12/05/14 1500  micafungin (MYCAMINE) 100 mg in sodium chloride 0.9 % 100 mL IVPB  Status:  Discontinued     100 mg 100 mL/hr over 1 Hours Intravenous Daily 12/05/14 1332 12/06/14 1045   12/05/14 1200  metroNIDAZOLE (FLAGYL) IVPB 500 mg  Status:  Discontinued     500 mg 100 mL/hr over 60 Minutes Intravenous Every  8 hours 12/05/14 1108 12/08/14 0805   12/05/14 1000  micafungin (MYCAMINE) 100 mg in sodium chloride 0.9 % 100 mL IVPB  Status:  Discontinued     100 mg 100 mL/hr over 1 Hours Intravenous Daily 12/04/14 2255 12/04/14 2343   12/05/14 0000  fluconazole (DIFLUCAN) IVPB 200 mg  Status:  Discontinued     200 mg 100 mL/hr over 60 Minutes Intravenous Every 24 hours 12/04/14 2259 12/05/14 1107   12/05/14 0000  vancomycin (VANCOCIN) IVPB 1000 mg/200 mL premix  Status:  Discontinued     1,000 mg 200 mL/hr over 60 Minutes Intravenous Every 8 hours 12/04/14 2356 12/06/14 0850   12/04/14 1515  piperacillin-tazobactam (ZOSYN) IVPB 3.375 g     3.375 g 12.5 mL/hr over 240 Minutes Intravenous Every 8 hours 12/04/14 1510     12/04/14 1415  vancomycin (VANCOCIN) 50 mg/mL oral solution 125 mg  Status:  Discontinued     125 mg Oral 4 times per day  12/04/14 1402 12/04/14 2346   12/04/14 1415  ciprofloxacin (CIPRO) IVPB 400 mg  Status:  Discontinued     400 mg 200 mL/hr over 60 Minutes Intravenous Every 12 hours 12/04/14 1402 12/04/14 1504       Assessment/Plan  POD 10, s/p subtotal colectomy with ileostomy -on soft diet, not eating much, but taking in Ensure and protein bars -continue to mobilize -patient on MS contin 15mg  BID. Doing better with oxy IR for breakthrough -cont VAC changes, MWF -WBC trending down. -cont IV abx therapy C diff colitis -? If the patient is a chronic carrier and not active for c diff.  His stool tested positive again just 2 days ago.  The patient no longer has any colon, except rectum.  The flagyl may be making him nauseated he thinks.  Will defer to ID, but this patient has been treated with flagyl since January for continued c diff positive tests. DVT prophylaxis -heparin/SCDs  LOS: 10 days    Rocco Kerkhoff E 12/14/2014, 10:34 AM Pager: [phone]

## 2014-12-14 NOTE — Care Management Note (Signed)
    Page 1 of 2   12/21/2014     4:16:48 PM CARE MANAGEMENT NOTE 12/21/2014  Patient:  Vincent Black, Vincent Black   Account Number:  [id number]  Date Initiated:  12/05/2014  Documentation initiated by:  Luz Lex  Subjective/Objective Assessment:   Admitted with abd pain - found free air - to surgery.  Acute Abdomen, ruptured bowl from home with wife     Action/Plan:   CIR denied, will follow for any needs   Anticipated DC Date:  12/18/2014   Anticipated DC Plan:  [place]  In-house referral  Clinical Social Worker      DC Forensic scientist  CM consult      [facility] Choice  [place]   Choice offered to / List presented to:  C-1 Patient   DME arranged  VAC      DME agency  [facility]     [place] arranged  HH-1 RN  [place]      [place] agency  [place]   Status of service:  Completed, signed off Medicare Important Message given?  NO (If response is "NO", the following Medicare IM given date fields will be blank) Date Medicare IM given:   Medicare IM given by:   Date Additional Medicare IM given:   Additional Medicare IM given by:    Discharge Disposition:  [place]  Per UR Regulation:  Reviewed for med. necessity/level of care/duration of stay  If discussed at [place] of Stay Meetings, dates discussed:    Comments:  ContactTheophil, [place] [phone]  [phone]    [facility] Father   Lawson III Brother   [phone]

## 2014-12-15 LAB — CBC WITH DIFFERENTIAL/PLATELET
Basophils Absolute: 0 10*3/uL (ref 0.0–0.1)
Basophils Relative: 0 % (ref 0–1)
Eosinophils Absolute: 0.6 10*3/uL (ref 0.0–0.7)
Eosinophils Relative: 3 % (ref 0–5)
HEMATOCRIT: 21.5 % — AB (ref 39.0–52.0)
Hemoglobin: 6.3 g/dL — CL (ref 13.0–17.0)
LYMPHS ABS: 1.8 10*3/uL (ref 0.7–4.0)
LYMPHS PCT: 9 % — AB (ref 12–46)
MCH: 27.2 pg (ref 26.0–34.0)
MCHC: 29.3 g/dL — AB (ref 30.0–36.0)
MCV: 92.7 fL (ref 78.0–100.0)
MONO ABS: 1.1 10*3/uL — AB (ref 0.1–1.0)
Monocytes Relative: 5 % (ref 3–12)
NEUTROS ABS: 17.9 10*3/uL — AB (ref 1.7–7.7)
Neutrophils Relative %: 84 % — ABNORMAL HIGH (ref 43–77)
Platelets: 507 10*3/uL — ABNORMAL HIGH (ref 150–400)
RBC: 2.32 MIL/uL — ABNORMAL LOW (ref 4.22–5.81)
RDW: 20.4 % — ABNORMAL HIGH (ref 11.5–15.5)
WBC: 21.5 10*3/uL — AB (ref 4.0–10.5)

## 2014-12-15 LAB — GLUCOSE, CAPILLARY
GLUCOSE-CAPILLARY: 98 mg/dL (ref 70–99)
GLUCOSE-CAPILLARY: 127 mg/dL — AB (ref 70–99)
GLUCOSE-CAPILLARY: 91 mg/dL (ref 70–99)
Glucose-Capillary: 100 mg/dL — ABNORMAL HIGH (ref 70–99)

## 2014-12-15 LAB — COMPREHENSIVE METABOLIC PANEL
ALT: 11 U/L — ABNORMAL LOW (ref 17–63)
ANION GAP: 8 (ref 5–15)
AST: 20 U/L (ref 15–41)
Albumin: <1 g/dL — ABNORMAL LOW (ref 3.5–5.0)
Alkaline Phosphatase: 651 U/L — ABNORMAL HIGH (ref 38–126)
BILIRUBIN TOTAL: 0.3 mg/dL (ref 0.3–1.2)
BUN: 17 mg/dL (ref 6–20)
CO2: 20 mmol/L — AB (ref 22–32)
Calcium: 7.2 mg/dL — ABNORMAL LOW (ref 8.9–10.3)
Chloride: 115 mmol/L — ABNORMAL HIGH (ref 101–111)
Creatinine, Ser: 1.63 mg/dL — ABNORMAL HIGH (ref 0.61–1.24)
GFR calc Af Amer: 59 mL/min — ABNORMAL LOW (ref >60)
GFR, EST NON AFRICAN AMERICAN: 50 mL/min — AB (ref >60)
GLUCOSE: 80 mg/dL (ref 70–99)
Potassium: 4.8 mmol/L (ref 3.5–5.1)
SODIUM: 143 mmol/L (ref 135–145)
Total Protein: 5 g/dL — ABNORMAL LOW (ref 6.5–8.1)

## 2014-12-15 LAB — MAGNESIUM: MAGNESIUM: 1.4 mg/dL — AB (ref 1.7–2.4)

## 2014-12-15 LAB — PHOSPHORUS: PHOSPHORUS: 3.4 mg/dL (ref 2.5–4.6)

## 2014-12-15 LAB — PREPARE RBC (CROSSMATCH)

## 2014-12-15 MED ORDER — SODIUM CHLORIDE 0.9 % IV SOLN
Freq: Once | INTRAVENOUS | Status: AC
  Administered 2014-12-18: 08:00:00 via INTRAVENOUS

## 2014-12-15 NOTE — Progress Notes (Signed)
Patient ID: Vincent Black, male   DOB: 09-Nov-1970, 44 y.o.   MRN: [id number]     [address] [place], [place] [address]    Phone: [phone] FAX: [phone]     Subjective: k up, given kayexalate.  No output recorded from JP, but looks serous.  Purulent drainage in VAC.  Afebrile.  WBC 21.5k.   Objective:  Vital signs:  Filed Vitals:   12/14/14 0538 12/14/14 1430 12/14/14 2132 12/15/14 0540  BP: 146/104 144/99 140/99 140/99  Pulse: 96 102 115 107  Temp: 97.7 F (36.5 C) 98.7 F (37.1 C) 98.9 F (37.2 C) 97.4 F (36.3 C)  TempSrc: Oral Oral Oral Oral  Resp: 18 20 18 18   Height:      Weight:      SpO2: 98% 98% 98% 99%    Last BM Date: 12/14/14  Intake/Output   Yesterday:  05/05 0701 - 05/06 0700 In: 1836.3 [P.O.:318; I.V.:568.3; IV Piggyback:950] Out: 2500 [Urine:2225; Stool:275] This shift: I/O last 3 completed shifts: In: 2156.3 [P.O.:618; I.V.:588.3; IV Piggyback:950] Out: 2500 [Urine:2225; Stool:275]      Physical Exam: General: Pt awake/alert/oriented x4 in no acute distress  Abdomen: Soft.  Nondistended. Midline wound-VAC with purulent drainage.  JP Drain with serous output.  Mildly tender at incisions only.  No evidence of peritonitis.  No incarcerated hernias.    Problem List:   Principal Problem:   Abdominal pain Active Problems:   Crohn's disease   Protein-calorie malnutrition, severe   Unintentional weight loss   Normocytic anemia   Thrombocytosis   Pericolonic abscess   Enteritis due to Clostridium difficile   Fever   Perforation of sigmoid colon   C. difficile colitis   Septic shock   Toxic megacolon   Small bowel fistula   Sinus tachycardia   Hypomagnesemia   Metabolic acidosis   Hyperglycemia   Leukocytosis   Chronic pain syndrome   Anxiety state   Perforated sigmoid colon   Anemia, chronic disease   Acute renal failure    Results:   Labs: Results  for orders placed or performed during the hospital encounter of 12/04/14 (from the past 48 hour(s))  Glucose, capillary     Status: None   Collection Time: 12/13/14 12:02 PM  Result Value Ref Range   Glucose-Capillary 99 70 - 99 mg/dL  Glucose, capillary     Status: Abnormal   Collection Time: 12/13/14  5:02 PM  Result Value Ref Range   Glucose-Capillary 102 (H) 70 - 99 mg/dL  Glucose, capillary     Status: None   Collection Time: 12/13/14  9:51 PM  Result Value Ref Range   Glucose-Capillary 88 70 - 99 mg/dL   Comment 1 Notify RN    Comment 2 Document in Chart   CBC with Differential/Platelet     Status: Abnormal   Collection Time: 12/14/14  5:05 AM  Result Value Ref Range   WBC 20.5 (H) 4.0 - 10.5 K/uL   RBC 2.57 (L) 4.22 - 5.81 MIL/uL   Hemoglobin 7.0 (L) 13.0 - 17.0 g/dL   HCT 23.5 (L) 39.0 - 52.0 %   MCV 91.4 78.0 - 100.0 fL   MCH 27.2 26.0 - 34.0 pg   MCHC 29.8 (L) 30.0 - 36.0 g/dL   RDW 20.1 (H) 11.5 - 15.5 %   Platelets 485 (H) 150 - 400 K/uL   Neutrophils Relative % 84 (H) 43 -  77 %   Neutro Abs 17.1 (H) 1.7 - 7.7 K/uL   Lymphocytes Relative 8 (L) 12 - 46 %   Lymphs Abs 1.7 0.7 - 4.0 K/uL   Monocytes Relative 5 3 - 12 %   Monocytes Absolute 1.0 0.1 - 1.0 K/uL   Eosinophils Relative 3 0 - 5 %   Eosinophils Absolute 0.6 0.0 - 0.7 K/uL   Basophils Relative 0 0 - 1 %   Basophils Absolute 0.0 0.0 - 0.1 K/uL  Comprehensive metabolic panel     Status: Abnormal   Collection Time: 12/14/14  5:05 AM  Result Value Ref Range   Sodium 142 135 - 145 mmol/L   Potassium 5.7 (H) 3.5 - 5.1 mmol/L   Chloride 115 (H) 101 - 111 mmol/L   CO2 20 (L) 22 - 32 mmol/L   Glucose, Bld 66 (L) 70 - 99 mg/dL   BUN 17 6 - 20 mg/dL   Creatinine, Ser 1.65 (H) 0.61 - 1.24 mg/dL   Calcium 7.1 (L) 8.9 - 10.3 mg/dL   Total Protein 4.7 (L) 6.5 - 8.1 g/dL   Albumin <1.0 (L) 3.5 - 5.0 g/dL    Comment: REPEATED TO VERIFY   AST 34 15 - 41 U/L   ALT 11 (L) 17 - 63 U/L   Alkaline Phosphatase 749 (H)  38 - 126 U/L   Total Bilirubin 0.6 0.3 - 1.2 mg/dL   GFR calc non Af Amer 50 (L) >60 mL/min   GFR calc Af Amer 58 (L) >60 mL/min    Comment: (NOTE) The eGFR has been calculated using the CKD EPI equation. This calculation has not been validated in all clinical situations. eGFR's persistently <90 mL/min signify possible Chronic Kidney Disease.    Anion gap 7 5 - 15  Magnesium     Status: Abnormal   Collection Time: 12/14/14  5:05 AM  Result Value Ref Range   Magnesium 1.5 (L) 1.7 - 2.4 mg/dL  Phosphorus     Status: None   Collection Time: 12/14/14  5:05 AM  Result Value Ref Range   Phosphorus 3.2 2.5 - 4.6 mg/dL  Glucose, capillary     Status: None   Collection Time: 12/14/14  7:43 AM  Result Value Ref Range   Glucose-Capillary 92 70 - 99 mg/dL  Glucose, capillary     Status: None   Collection Time: 12/14/14 11:45 AM  Result Value Ref Range   Glucose-Capillary 98 70 - 99 mg/dL  Clostridium Difficile by PCR     Status: None   Collection Time: 12/14/14  3:14 PM  Result Value Ref Range   C difficile by pcr  NEGATIVE    TEST PERFORMED ONCE PER 72 HOURS/CREDITED PENDING REORDER 12/15/14  Glucose, capillary     Status: Abnormal   Collection Time: 12/14/14  5:48 PM  Result Value Ref Range   Glucose-Capillary 110 (H) 70 - 99 mg/dL  Glucose, capillary     Status: Abnormal   Collection Time: 12/14/14  9:30 PM  Result Value Ref Range   Glucose-Capillary 101 (H) 70 - 99 mg/dL  CBC with Differential/Platelet     Status: Abnormal   Collection Time: 12/15/14  3:05 AM  Result Value Ref Range   WBC 21.5 (H) 4.0 - 10.5 K/uL    Comment: REPEATED TO VERIFY   RBC 2.32 (L) 4.22 - 5.81 MIL/uL   Hemoglobin 6.3 (LL) 13.0 - 17.0 g/dL    Comment: SPECIMEN CHECKED FOR CLOTS REPEATED TO VERIFY CRITICAL  RESULT CALLED TO, READ BACK BY AND VERIFIED WITH: AKRIDGE, ADRIENNE RN [phone] 12/15/2014 BY MACEDA, J    HCT 21.5 (L) 39.0 - 52.0 %   MCV 92.7 78.0 - 100.0 fL   MCH 27.2 26.0 - 34.0 pg   MCHC  29.3 (L) 30.0 - 36.0 g/dL   RDW 20.4 (H) 11.5 - 15.5 %   Platelets 507 (H) 150 - 400 K/uL    Comment: SPECIMEN CHECKED FOR CLOTS REPEATED TO VERIFY    Neutrophils Relative % 84 (H) 43 - 77 %   Neutro Abs 17.9 (H) 1.7 - 7.7 K/uL   Lymphocytes Relative 9 (L) 12 - 46 %   Lymphs Abs 1.8 0.7 - 4.0 K/uL   Monocytes Relative 5 3 - 12 %   Monocytes Absolute 1.1 (H) 0.1 - 1.0 K/uL   Eosinophils Relative 3 0 - 5 %   Eosinophils Absolute 0.6 0.0 - 0.7 K/uL   Basophils Relative 0 0 - 1 %   Basophils Absolute 0.0 0.0 - 0.1 K/uL  Comprehensive metabolic panel     Status: Abnormal   Collection Time: 12/15/14  3:05 AM  Result Value Ref Range   Sodium 143 135 - 145 mmol/L   Potassium 4.8 3.5 - 5.1 mmol/L   Chloride 115 (H) 101 - 111 mmol/L   CO2 20 (L) 22 - 32 mmol/L   Glucose, Bld 80 70 - 99 mg/dL   BUN 17 6 - 20 mg/dL   Creatinine, Ser 1.63 (H) 0.61 - 1.24 mg/dL   Calcium 7.2 (L) 8.9 - 10.3 mg/dL   Total Protein 5.0 (L) 6.5 - 8.1 g/dL   Albumin <1.0 (L) 3.5 - 5.0 g/dL   AST 20 15 - 41 U/L   ALT 11 (L) 17 - 63 U/L   Alkaline Phosphatase 651 (H) 38 - 126 U/L   Total Bilirubin 0.3 0.3 - 1.2 mg/dL   GFR calc non Af Amer 50 (L) >60 mL/min   GFR calc Af Amer 59 (L) >60 mL/min    Comment: (NOTE) The eGFR has been calculated using the CKD EPI equation. This calculation has not been validated in all clinical situations. eGFR's persistently <60 mL/min signify possible Chronic Kidney Disease.    Anion gap 8 5 - 15  Magnesium     Status: Abnormal   Collection Time: 12/15/14  3:05 AM  Result Value Ref Range   Magnesium 1.4 (L) 1.7 - 2.4 mg/dL  Phosphorus     Status: None   Collection Time: 12/15/14  3:05 AM  Result Value Ref Range   Phosphorus 3.4 2.5 - 4.6 mg/dL  Prepare RBC     Status: None   Collection Time: 12/15/14  7:18 AM  Result Value Ref Range   Order Confirmation ORDER PROCESSED BY BLOOD BANK   Glucose, capillary     Status: None   Collection Time: 12/15/14  7:51 AM  Result  Value Ref Range   Glucose-Capillary 98 70 - 99 mg/dL    Imaging / Studies: No results found.  Medications / Allergies:  Scheduled Meds: . sodium chloride   Intravenous Once  . antiseptic oral rinse  7 mL Mouth Rinse q12n4p  . dextrose  25 mL Intravenous Once  . feeding supplement (ENSURE ENLIVE)  237 mL Oral BID BM  . feeding supplement (PRO-STAT SUGAR FREE 64)  30 mL Oral BID WC  . fluconazole (DIFLUCAN) IV  400 mg Intravenous Q24H  . heparin subcutaneous  5,000 Units Subcutaneous 3 times  per day  . lip balm  1 application Topical BID  . methocarbamol  500 mg Oral TID  . metoprolol tartrate  12.5 mg Oral BID  . mirtazapine  15 mg Oral QHS  . morphine  15 mg Oral Q12H  . piperacillin-tazobactam (ZOSYN)  IV  3.375 g Intravenous Q8H  . prednisoLONE acetate  1 drop Both Eyes BID  . saccharomyces boulardii  250 mg Oral BID   Continuous Infusions: . dextrose 5 % and 0.9% NaCl 1,000 mL (12/14/14 1125)   PRN Meds:.alum & mag hydroxide-simeth, butamben-tetracaine-benzocaine, diphenhydrAMINE, LORazepam, magic mouthwash, ondansetron **OR** ondansetron (ZOFRAN) IV, oxyCODONE, oxyCODONE, phenol, promethazine, sodium chloride  Antibiotics: Anti-infectives    Start     Dose/Rate Route Frequency Ordered Stop   12/12/14 1400  metroNIDAZOLE (FLAGYL) tablet 500 mg  Status:  Discontinued     500 mg Oral 3 times per day 12/12/14 1011 12/14/14 1404   12/12/14 1300  fluconazole (DIFLUCAN) IVPB 400 mg     400 mg 100 mL/hr over 120 Minutes Intravenous Every 24 hours 12/12/14 1015     12/07/14 1300  fluconazole (DIFLUCAN) IVPB 200 mg  Status:  Discontinued     200 mg 100 mL/hr over 60 Minutes Intravenous Every 24 hours 12/07/14 1102 12/12/14 1015   12/06/14 1300  fluconazole (DIFLUCAN) IVPB 400 mg  Status:  Discontinued     400 mg 100 mL/hr over 120 Minutes Intravenous Every 24 hours 12/06/14 1119 12/07/14 1102   12/06/14 0000  micafungin (MYCAMINE) 100 mg in sodium chloride 0.9 % 100 mL IVPB   Status:  Discontinued     100 mg 100 mL/hr over 1 Hours Intravenous Daily 12/05/14 1108 12/05/14 1332   12/05/14 1500  micafungin (MYCAMINE) 100 mg in sodium chloride 0.9 % 100 mL IVPB  Status:  Discontinued     100 mg 100 mL/hr over 1 Hours Intravenous Daily 12/05/14 1332 12/06/14 1045   12/05/14 1200  metroNIDAZOLE (FLAGYL) IVPB 500 mg  Status:  Discontinued     500 mg 100 mL/hr over 60 Minutes Intravenous Every 8 hours 12/05/14 1108 12/08/14 0805   12/05/14 1000  micafungin (MYCAMINE) 100 mg in sodium chloride 0.9 % 100 mL IVPB  Status:  Discontinued     100 mg 100 mL/hr over 1 Hours Intravenous Daily 12/04/14 2255 12/04/14 2343   12/05/14 0000  fluconazole (DIFLUCAN) IVPB 200 mg  Status:  Discontinued     200 mg 100 mL/hr over 60 Minutes Intravenous Every 24 hours 12/04/14 2259 12/05/14 1107   12/05/14 0000  vancomycin (VANCOCIN) IVPB 1000 mg/200 mL premix  Status:  Discontinued     1,000 mg 200 mL/hr over 60 Minutes Intravenous Every 8 hours 12/04/14 2356 12/06/14 0850   12/04/14 1515  piperacillin-tazobactam (ZOSYN) IVPB 3.375 g     3.375 g 12.5 mL/hr over 240 Minutes Intravenous Every 8 hours 12/04/14 1510     12/04/14 1415  vancomycin (VANCOCIN) 50 mg/mL oral solution 125 mg  Status:  Discontinued     125 mg Oral 4 times per day 12/04/14 1402 12/04/14 2346   12/04/14 1415  ciprofloxacin (CIPRO) IVPB 400 mg  Status:  Discontinued     400 mg 200 mL/hr over 60 Minutes Intravenous Every 12 hours 12/04/14 1402 12/04/14 1504       Assessment/Plan Crohn's disease, Perforated sigmoid colon, perforated cecum toxic megacolon, small bowel fistula POD 11, s/p subtotal colectomy with ileostomy---Dr. Donne Hazel -on soft diet, not eating much, but taking in Ensure and  protein bars -continue to mobilize -patient on MS contin 38m BID. Doing better with oxy IR for breakthrough -cont VAC changes, MWF, will assess today as he has purulent drainage in container.  -will d/w attending if okay  to remove JP Drain.  ID-per ID, Zosyn D#11/14, Fluconazole D#10.  Flagyl stopped 5/3-5/5 d/t nausea. Cx yielded candida.  C diff colitis -could be a chronic carrier, had kayexalate this morning, however, with a+ PCR  5/3, diarrhea we cant make that assumption.  Dr. HJohnnye Simarecommended not stopping flagyl, instead starting PO vanc if unable to tolerate which seems reasonable to start. DVT prophylaxis -heparin/SCDs  EErby Pian [facility]ad[facility] Hospital[facility] Pager [phone] For consults and floor pages call [phone](7A-4:30P)  12/15/2014 8:49 AM

## 2014-12-15 NOTE — Progress Notes (Signed)
Asked by RN to pull JP drain.  Left abd JP drain pulled and sutures removed and small dressing applied.  Patient tolerated procedure well.  RN to call if assistance needed

## 2014-12-15 NOTE — Progress Notes (Signed)
Faxed to [phone] at [facility], RD consult per request, for approval of home wound vac services.

## 2014-12-15 NOTE — Plan of Care (Signed)
Problem: Phase II Progression Outcomes Goal: Return of bowel function (flatus, BM) IF ABDOMINAL SURGERY:  Outcome: Completed/Met Date Met:  12/15/14 Pt has colostomy

## 2014-12-15 NOTE — Progress Notes (Signed)
Wound assessed at [facility] change.  Inferior aspect with some cloudy drainage.  Finger fractured most of his tissues back open to a fibrinous unhealthy base.  Will begin NS WD dressing changes TID to wound.  No evidence of infection though.  Vincent Black E 12:26 PM 12/15/2014

## 2014-12-15 NOTE — Consult Note (Addendum)
WOC follow-up: Ileostomy pouch intact with good seal, large amt green liquid drainage.  Emptied 200cc.  Pouch was applied 5/4.  Wife at bedside states she feels comfortable with pouch application and emptying.   Vac dressing changed; CCS PA at bedside to assess wound.  Vac had become blocked earlier with thick tan drainage.  New area of increased depth at 6:00 o'clock on wound with mod amt thick drainage when probed, depth 1 cm when swab inserted.  Wound 80% red, 20% yellow, no odor. 21X4X.4 cm with deeper area noted above. Vac dressings D/Ced and moist gauze packing applied as requested by CCS team. Discussed plan of care with patient and wife at bedside and they deny further questions. Julien Girt MSN, RN, [place], [place], [place]

## 2014-12-15 NOTE — Progress Notes (Signed)
Progress Note   Vincent Black DXA:[id number] DOB: 1971-05-13 DOA: 12/04/2014 PCP: No PCP Per Black   Brief Narrative:   Vincent Black is an 44 y.o. male with a PMH of anxiety, ankylosing spondylitis, Crohn's disease and Clostridium difficile colitis who was admitted 12/04/14 Vincent chief complaint of worsening abdominal pain, found to have a perforated sigmoid colon and perforated cecal toxic megacolon as well as small bowel fistula. He underwent exploratory laparotomy/small bowel resection with anastomosis and total abdominal colectomy with end ileostomy on 12/04/14.  Assessment/Plan:   Principal Problem: Septic shock secondary to Perforated sigmoid colon, perforated cecal toxic megacolon, and small bowel fistula in Vincent setting of Crohn's disease  - s/p ex lap and SBR with anastomosis, total abdominal colectomy with end ileostomy (4/25 - Dr. Donne Hazel). - Continue antibiotics per ID recommendations: 2 weeks of therapy with Zosyn/fluconazole. - Due to problems with nausea, Flagyl discontinued early per surgical recommendations. Repeat C. difficile PCR testing. - Wound cultures positive for Candida albicans, continue Diflucan. Blood cultures negative. - GI, surgery and ID following.  Active problems:  Hyperkalemia - Potassium supplementation in IV fluids discontinued and potassium WNL after receiving Kayexalate 30 g 1 on 12/14/14.  Severe protein calorie malnutrition / unintentional weight loss - Vincent Black has lost over 50 pounds in Vincent past 6 months. - Continue nutritional supplements. - Continue Remeron for appetite stimulation.  C.diff colitis  - Repeat C diff ordered; if negative, ok to come out of isolation if ok with IP.  - Flagyl discontinued by surgery as he's felt to be at low risk for ongoing C. difficile infection with removal of his colon. - C. difficile enteritis still possible, although rare.  Sinus tachycardia - Improved with treatment of sepsis.  Acute  renal failure/Decreased uop - Prerenal in etiology with significant third spacing of fluid. - Creatinine improving.  Normocytic Anemia - chronic - Hemoglobin 6.3 mg/dL this morning, 1 unit of packed red blood cells ordered.  Thrombocytosis  - Monitoring.  Leukocytosis - Secondary to sepsis, WBC slowly trending down.  Acute on chronic pain uncontrolled  - Continue Robaxin, MS Contin and OxyIR for breakthrough pain.  Anxiety -PRN Ativan.  DVT Prophylaxis - Continue subcutaneous heparin.  Code Status: Full. Family Communication: Wilhemena Durie, wife, and Vincent Black's father were updated at bedside.   Disposition Plan: SNF for rehab versus home with PT when medically stable and course of IV antibiotics with Zosyn completed, likely 3-4 days.   IV Access:    CVC right internal jugular placed 12/04/14   Procedures and diagnostic studies:   Dg Chest 1 View  12/04/2014   CLINICAL DATA:  Fever. Nasogastric tube placement. Crohn disease. Anxiety.  EXAM: CHEST  1 VIEW  COMPARISON:  CT of 11/17/2014  FINDINGS: Nasogastric terminates at Vincent body of Vincent stomach. Suspect remote anterior right rib trauma including at Vincent second and possibly Vincent third anterior right ribs. Midline trachea. Normal heart size. No pleural fluid. Low lung volumes. Patchy bibasilar atelectasis. Free intraperitoneal air under both hemidiaphragms.  IMPRESSION: 1. Appropriate position of nasogastric tube. 2. Extensive free intraperitoneal air. Critical test results telephoned toMaggie, r.n. at Vincent time of interpretation at 4:40 p.m.on . 12/04/2014.   Electronically Signed   By: Abigail Miyamoto M.D.   On: 12/04/2014 16:38   Ct Abdomen Pelvis W Contrast  11/17/2014   CLINICAL DATA:  Crohn's disease. Pericolonic abscess drained on 03/18, output about 20 cc a day. Pain in vicinity of drain.  EXAM: CT CHEST, ABDOMEN, AND PELVIS WITH CONTRAST  TECHNIQUE: Multidetector CT imaging of Vincent chest, abdomen and pelvis was performed following Vincent  standard protocol during bolus administration of intravenous contrast.  CONTRAST:  173mL OMNIPAQUE IOHEXOL 300 MG/ML  SOLN  COMPARISON:  11/03/2014  FINDINGS: Lower chest:  Stable scarring in both lower lobes.  Hepatobiliary: Stable 7 mm in long axis hypodense lesion in segment 6, image 33 series 2. Gallbladder unremarkable.  Pancreas: Unremarkable  Spleen: Unremarkable  Adrenals/Urinary Tract: Unremarkable  Stomach/Bowel: Small locules of extraluminal gas and mild stranding at Vincent site of Vincent pigtail catheter adjacent to a loop of small bowel and Vincent sigmoid colon. There was previously an abscess filled with fluid and gas; currently there is only several locules of gas along with inflammatory stranding in this vicinity. Several extraluminal locules of gas are present below Vincent level of Vincent pigtail catheter, for example on image 73 of series 2. There is abnormal wall thickening and wall irregularity in Vincent distal descending colon and sigmoid colon with a full-thickness ulceration of Vincent sigmoid colon wall on image 73 of series 2 anteriorly. Obvious spillage of extraluminal contrast is not seen. Vincent pigtail was formed in Vincent immediate vicinity of a loop of small bowel. Scattered loops of mildly dilated small bowel noted. There stranding around Vincent appendix which is at Vincent upper limits of normal in size.  Vascular/Lymphatic: Small retroperitoneal and mesenteric lymph nodes are probably reactive.  Reproductive: Unremarkable  Other: No supplemental non-categorized findings.  Musculoskeletal: Fused sacroiliac joints as can be encountered in Vincent setting of inflammatory bowel disease or ankylosing spondylitis. Multilevel bridging spurring in Vincent lower thoracic and upper lumbar spine.  IMPRESSION: 1. Although Vincent main body of Vincent abscess has been successfully drained, there continue to be locules of extraluminal gas not only at Vincent pigtail but also along Vincent anterior sigmoid colon where there appears to be a full-thickness  ulceration in Vincent colon wall anteriorly along Vincent marked wall thickening. Vincent abnormal wall thickening extends from Vincent descending colon to Vincent rectum but is most severe in Vincent proximal half of Vincent sigmoid colon. There surrounding inflammatory stranding as well. 2. Vincent pigtail was also formed in Vincent immediate vicinity of a loop of small bowel which almost appears to pass through Vincent pigtail loop although this may be misleading. 3. Upper normal wall thickness of Vincent appendix with low-grade adjacent inflammatory stranding. This is not to a level to be definitive for acute appendicitis but may merit observation. 4. Scattered mildly dilated loops of small bowel with air-fluid levels suggesting ileus. Scattered mesenteric and very rectal lymph nodes are likely inflammatory. 5. 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   Electronically Signed   By: Van Clines M.D.   On: 11/17/2014 11:28   Ir Sinus/fist Tube Chk-non Gi  11/28/2014   CLINICAL DATA:  History of Crohn's disease and colonic perforation with development of an intraperitoneal abscess.  Black underwent CT-guided percutaneous drainage catheter placement on 10/27/2014 with subsequent CT scan performed 11/17/2014 demonstrating near complete resolution of Vincent abdominal abscess. Contrast injection of Vincent percutaneous drain also performed 11/17/2014 demonstrated a persistent fistulous connection with Vincent adjacent bowel.  [facility] returns to [facility] for repeat fluoroscopic guided percutaneous drainage catheter injection. Vincent Black reports minimal to no output from Vincent percutaneous drainage catheter. Vincent Black has not been flushing Vincent percutaneous drain.  EXAM: SINUS TRACT INJECTION/FISTULOGRAM  COMPARISON:  CT abdomen and pelvis- 11/17/2014; 11/03/2014; 10/26/2014; CT-guided percutaneous drainage catheter placement -10/27/2014 ; fluoroscopic  guided percutaneous drainage catheter injection- 11/17/2014  CONTRAST:  10 mL OMNIPAQUE IOHEXOL 300 MG/ML SOLN - injected via Vincent existing left lower quadrant percutaneous drainage catheter.  FLUOROSCOPY TIME:  1 minute, 18 seconds (33.8 mGy).  TECHNIQUE: Black was placed supine on Vincent fluoroscopy table.  A preprocedural spot fluoroscopic image was obtained of Vincent left lower abdominal quadrant and existing percutaneous drainage catheter.  Multiple spot fluoroscopic and radiographic images were obtained in various obliquities following Vincent injection of a small amount of contrast via Vincent percutaneous drainage catheter.  Images were reviewed and Vincent procedure was terminated. Vincent drainage catheter was flushed with a small amount of saline and reconnected to a gravity bag. A dressing was placed. Vincent Black tolerated Vincent procedure well without immediate postprocedural complication.  FINDINGS: Preprocedural spot fluoroscopic image demonstrates unchanged positioning of Vincent percutaneous drainage catheter overlying left lower abdominal quadrant.  Contrast injection confirms a persistent fistulous connection with an adjacent loop of small bowel.  As such, Vincent drainage catheter was reconnected to a gravity bag.  IMPRESSION: Persistent fistulous connection from Vincent percutaneous drainage catheter to an adjacent loop of small bowel.  PLAN: Black will return to [facility] on 12/12/2014 for repeat fluoroscopic guided percutaneous drainage catheter injection. If a fistulous connection persists, surgical revision may have to be considered.   Electronically Signed   By: Sandi Mariscal M.D.   On: 11/28/2014 14:25   Ir Sinus/fist Tube Chk-non Gi  11/17/2014   CLINICAL DATA:  COLONIC DIVERTICULAR ABSCESS, STATUS POST PERCUTANEOUS DRAINAGE 10/27/2014  EXAM: FLUOROSCOPIC INJECTION OF Vincent EXISTING LEFT LOWER QUADRANT ABSCESS DRAIN  Date:  4/8/20164/03/2015 12:38 pm  Radiologist:  M. Daryll Brod, MD   MEDICATIONS AND MEDICAL HISTORY: NONE.  COMPLICATIONS: NONE IMMEDIATE  PROCEDURE: Informed consent was obtained from Vincent Black following explanation of Vincent procedure, risks, benefits and alternatives. Vincent Black understands, agrees and consents for Vincent procedure. All questions were addressed. A time out was performed.  UNDER STERILE CONDITIONS, Vincent EXISTING DRAIN CATHETER WAS INJECTED WITH CONTRAST UNDER FLUOROSCOPY. IMAGES OBTAINED FOR DOCUMENTATION. Vincent ABSCESS CAVITY IS COLLAPSED. THIS CORRELATES WITH Vincent CT. THERE IS A SMALL ADJACENT FISTULA FROM Vincent COLLAPSED ABSCESS CAVITY TO AN ADJACENT SMALL BOWEL LOOP.  IMPRESSION: Positive exam for a patent fistula from Vincent collapsed abscess cavity to an adjacent small bowel loop.  PLAN: Exchange suction bulb for a gravity bag. Outpatient repeat injection on 11/28/2014   Electronically Signed   By: Jerilynn Mages.  Shick M.D.   On: 11/17/2014 12:49   Dg Chest Port 1 View  12/07/2014   CLINICAL DATA:  Pulmonary edema.  EXAM: PORTABLE CHEST - 1 VIEW  COMPARISON:  12/06/2014  FINDINGS: Low lung volumes with bibasilar atelectasis and vascular congestion, similar to prior study. Lucency under Vincent right hemidiaphragm again noted, possibly pneumoperitoneum. NG tube and right central line remain in place, unchanged.  IMPRESSION: Low lung volumes with bibasilar atelectasis.  Lucency under Vincent right hemidiaphragm again noted, possibly pneumoperitoneum related to recent surgery. May consider decubitus view of Vincent abdomen to assess degree of pneumoperitoneum if felt clinically indicated.   Electronically Signed   By: Rolm Baptise M.D.   On: 12/07/2014 08:50   Dg Chest Port 1 View  12/06/2014   CLINICAL DATA:  Edema  EXAM: PORTABLE CHEST - 1 VIEW  COMPARISON:  12/04/2014  FINDINGS: Low lung volumes with vascular crowding. Possible mild interstitial  edema with bibasilar atelectasis. No pneumothorax.  Vincent heart is normal in size.  Right IJ venous catheter terminates at Vincent cavoatrial  junction.  Enteric tube traverses in Vincent distal gastric antrum.  Lucency beneath Vincent right hemidiaphragm could suggest free air related to recent surgery.  IMPRESSION: Low lung volumes with vascular crowding and possible mild interstitial edema.  Bibasilar atelectasis.  Support apparatus as above.   Electronically Signed   By: Julian Hy M.D.   On: 12/06/2014 09:45   Dg Chest Port 1 View  12/04/2014   CLINICAL DATA:  Central line and NG tube placement.  EXAM: PORTABLE CHEST - 1 VIEW  COMPARISON:  Earlier same day  FINDINGS: Grossly unchanged cardiac silhouette and mediastinal contours. Interval placement of a right jugular approach intravenous catheter with tip projected of Vincent superior cavoatrial junction. Enteric tube tip and side port projects over Vincent expected location of Vincent gastric antrum. No pneumothorax. Apparent resolution of previously noted pneumoperitoneum. Improved aeration of lung bases without focal airspace opacity. No pleural effusion or pneumothorax. Unchanged bones.  IMPRESSION: 1. Appropriately positioned support apparatus as above. No pneumothorax. 2. Apparent resolution of previously noted large amount of pneumoperitoneum. 3. Improved aeration lungs with resolved bibasilar atelectasis.   Electronically Signed   By: Sandi Mariscal M.D.   On: 12/04/2014 21:19   Dg Abd Portable 1v  12/07/2014   CLINICAL DATA:  Nasogastric tube placement.  Initial encounter.  EXAM: PORTABLE ABDOMEN - 1 VIEW  COMPARISON:  Abdominal radiograph performed 12/04/2014  FINDINGS: Vincent Black's enteric tube is noted ending overlying Vincent body of Vincent stomach.  Vincent visualized bowel gas pattern is unremarkable. A bowel suture line is noted at Vincent left lower quadrant. An ostomy is seen at Vincent right lower quadrant. Scattered air and stool filled loops of colon are seen; no abnormal dilatation of small bowel loops is seen to suggest small bowel obstruction. No free intra-abdominal air is identified, though evaluation  for free air is limited on a single supine view.  Vincent visualized osseous structures are within normal limits; Vincent sacroiliac joints are unremarkable in appearance.  A drainage catheter is noted overlying Vincent upper pelvis.  IMPRESSION: 1. Enteric tube noted ending overlying Vincent body of Vincent stomach. 2. Unremarkable bowel gas pattern; postoperative change at Vincent lower quadrants. No free intra-abdominal air seen.   Electronically Signed   By: Garald Balding M.D.   On: 12/07/2014 06:32   Dg Abd Portable 1v  12/04/2014   CLINICAL DATA:  Nasogastric tube placement.  EXAM: PORTABLE ABDOMEN - 1 VIEW  COMPARISON:  Abdominal CT 11/17/2014  FINDINGS: There is large pneumoperitoneum with Rigler's sign and outlining of Vincent falciform ligament. Vincent colon is distended with high-density material, likely retained contrast from comparison CT. Small bowel dilatation.  Nasogastric tube is in good position. There is a left lower quadrant percutaneous abscess drain.  Critical Value/emergent results were called by telephone at Vincent time of interpretation on 12/04/2014 at 4:50 pm to Dr. [name] , who verbally acknowledged these results.  IMPRESSION: 1. Large pneumoperitoneum consistent with bowel perforation. 2. Small bowel and colonic distention which could be from low colonic obstruction or ileus. 3. Nasogastric tube is in good position. 4. Left lower quadrant percutaneous abscess drain.   Electronically Signed   By: Monte Fantasia M.D.   On: 12/04/2014 16:51     Medical Consultants:    Meredith Staggers, MD, Physical Medicine and Rehabilitation  Carol Ada, MD, Gastroenterology  Simonne Maffucci,  MD, PCCM  Rolm Bookbinder, MD, General Surgery   Anti-Infectives:   Total days of antibiotics: 8  C diff therapy: day 7 (po vanco --> flagyl)  Zosyn 4/25 > 12/18/14  Flagyl 4/26 > 12/14/14  Fluconazole 4/27 > 12/20/14  Received mycafungin prior to admission  Subjective:   Vincent Black continues to report  significant abdominal pain and had some nausea last night. Appetite improving. No vomiting. Ostomy draining brown stool.  Objective:    Filed Vitals:   12/14/14 0538 12/14/14 1430 12/14/14 2132 12/15/14 0540  BP: 146/104 144/99 140/99 140/99  Pulse: 96 102 115 107  Temp: 97.7 F (36.5 C) 98.7 F (37.1 C) 98.9 F (37.2 C) 97.4 F (36.3 C)  TempSrc: Oral Oral Oral Oral  Resp: 18 20 18 18   Height:      Weight:      SpO2: 98% 98% 98% 99%    Intake/Output Summary (Last 24 hours) at 12/15/14 0805 Last data filed at 12/15/14 0305  Gross per 24 hour  Intake 1836.33 ml  Output   2300 ml  Net -463.67 ml    Exam: Gen:  NAD, weak Cardiovascular:  Mildly tachycardic, No M/R/G Respiratory:  Lungs CTAB Gastrointestinal:  Abdomen soft, midline abdominal incision with VAC wound in place, healing well, ostomy right lower quadrant with brown stool present Extremities:  1-2 plus pitting edema bilaterally   Data Reviewed:    Labs: Basic Metabolic Panel:  Recent Labs Lab 12/09/14 0418  12/11/14 0527 12/12/14 0356 12/13/14 0430 12/14/14 0505 12/15/14 0305  NA 135  < > 139 139 141 142 143  K 3.0*  < > 3.7 4.2 4.9 5.7* 4.8  CL 105  < > 111 111 111 115* 115*  CO2 22  < > 23 20* 20* 20* 20*  GLUCOSE 69*  < > 128* 111* 83 66* 80  BUN 27*  < > 22* 19 20 17 17   CREATININE 2.34*  < > 1.82* 1.68* 1.65* 1.65* 1.63*  CALCIUM 6.8*  < > 6.9* 6.9* 7.0* 7.1* 7.2*  MG 2.2  --   --   --  1.6* 1.5* 1.4*  PHOS 3.2  --   --   --  2.7 3.2 3.4  < > = values in this interval not displayed. GFR Estimated Creatinine Clearance: 59 mL/min (by C-G formula based on Cr of 1.63). Liver Function Tests:  Recent Labs Lab 12/09/14 0418 12/13/14 0430 12/14/14 0505 12/15/14 0305  AST 20 28 34 20  ALT 10 10* 11* 11*  ALKPHOS 109 790* 749* 651*  BILITOT 0.6 0.4 0.6 0.3  PROT 3.9* 4.6* 4.7* 5.0*  ALBUMIN <1.0* <1.0* <1.0* <1.0*   CBC:  Recent Labs Lab 12/09/14 0418 12/10/14 0445 12/13/14 0430  12/14/14 0505 12/15/14 0305  WBC 26.7* 38.2* 21.9* 20.5* 21.5*  NEUTROABS  --   --  17.7* 17.1* 17.9*  HGB 7.0* 7.5* 7.0* 7.0* 6.3*  HCT 22.2* 23.9* 23.1* 23.5* 21.5*  MCV 86.0 86.3 91.3 91.4 92.7  PLT 265 321 459* 485* 507*   CBG:  Recent Labs Lab 12/14/14 0743 12/14/14 1145 12/14/14 1748 12/14/14 2130 12/15/14 0751  GLUCAP 92 98 110* 101* 59   Microbiology Recent Results (from Vincent past 240 hour(s))  Clostridium Difficile by PCR     Status: Abnormal   Collection Time: 12/12/14 10:19 AM  Result Value Ref Range Status   C difficile by pcr POSITIVE (A) NEGATIVE Final    Comment: CRITICAL RESULT CALLED TO, READ BACK  BY AND VERIFIED WITH: Bethel Born RN 12:00 12/12/14 ([email or web address])   Clostridium Difficile by PCR     Status: None   Collection Time: 12/14/14  3:14 PM  Result Value Ref Range Status   C difficile by pcr  NEGATIVE Final    TEST PERFORMED ONCE PER 72 HOURS/CREDITED PENDING REORDER 12/15/14     Medications:   . sodium chloride   Intravenous Once  . antiseptic oral rinse  7 mL Mouth Rinse q12n4p  . dextrose  25 mL Intravenous Once  . feeding supplement (ENSURE ENLIVE)  237 mL Oral BID BM  . feeding supplement (PRO-STAT SUGAR FREE 64)  30 mL Oral BID WC  . fluconazole (DIFLUCAN) IV  400 mg Intravenous Q24H  . heparin subcutaneous  5,000 Units Subcutaneous 3 times per day  . lip balm  1 application Topical BID  . methocarbamol  500 mg Oral TID  . metoprolol tartrate  12.5 mg Oral BID  . mirtazapine  15 mg Oral QHS  . morphine  15 mg Oral Q12H  . piperacillin-tazobactam (ZOSYN)  IV  3.375 g Intravenous Q8H  . prednisoLONE acetate  1 drop Both Eyes BID  . saccharomyces boulardii  250 mg Oral BID   Continuous Infusions: . dextrose 5 % and 0.9% NaCl 1,000 mL (12/14/14 1125)    Time spent: 25 minutes.    LOS: 11 days   [facility] Pager [phone]. If unable to reach me by pager, please call my cell phone at [phone].  *Please refer  to [email or web address], password [email or web address] to get updated schedule on who will round on this Black, as hospitalists switch teams weekly. If 7PM-7AM, please contact night-coverage at [email or web address], password [email or web address] for any overnight needs.  12/15/2014, 8:05 AM

## 2014-12-15 NOTE — Progress Notes (Signed)
Updated Alexis at [facility], [facility] not needed, dressing management change to NS WTD, verified with wound ostomy RN Dawn that there is no foreseeable need of a VAC at DC, she confirmed.

## 2014-12-15 NOTE — Progress Notes (Signed)
ANTIBIOTIC CONSULT NOTE - FOLLOW UP  Pharmacy Consult for zosyn/Fluconazole Indication: CDiff toxic megacolon + perforation   No Known Allergies  Patient Measurements: Height: 5\' 9"  (175.3 cm) Weight: 170 lb 6.7 oz (77.3 kg) IBW/kg (Calculated) : 70.7 Adjusted Body Weight:   Vital Signs: Temp: 97.6 F (36.4 C) (05/06 1004) Temp Source: Oral (05/06 1004) BP: 153/102 mmHg (05/06 1004) Pulse Rate: 103 (05/06 1004) Intake/Output from previous day: 05/05 0701 - 05/06 0700 In: 1836.3 [P.O.:318; I.V.:568.3; IV Piggyback:950] Out: 2500 [Urine:2225; Stool:275] Intake/Output from this shift: Total I/O In: 200 [P.O.:200] Out: -   Labs:  Recent Labs  12/13/14 0430 12/14/14 0505 12/15/14 0305  WBC 21.9* 20.5* 21.5*  HGB 7.0* 7.0* 6.3*  PLT 459* 485* 507*  CREATININE 1.65* 1.65* 1.63*   Estimated Creatinine Clearance: 59 mL/min (by C-G formula based on Cr of 1.63). No results for input(s): VANCOTROUGH, VANCOPEAK, VANCORANDOM, GENTTROUGH, GENTPEAK, GENTRANDOM, TOBRATROUGH, TOBRAPEAK, TOBRARND, AMIKACINPEAK, AMIKACINTROU, AMIKACIN in the last 72 hours.   Assessment: Pt is 44 yo male with known history of Crohn's disease, several recent admissions in March 2016. Last with severe C. difficile colitis complicated by pericolonic abscess requiring drain placement on 10/27/14 and positive for Citrobacter and Klebsiella, most recent discharge on 3/30 after being treated for severe sepsis secondary to intra-abd abscess. Returned 12/04/2014 c/o sudden onset of RUQ pain, generalized weakness, and hematemesis, admitted to MICU after ex lap and total colectomy, to begin IV ABX for sepsis.  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 Afebrile, WBC 26.7 >38.2>21 SCr 1.6,   Patient to finish two course of zosyn and fluconazole on 5/9 and 5/10 respectively. Renal function stable so no dose  adjustments warranted. New cdiff was negative and no po vanc has been started as a result.   Pharmacy to sign off and follow peripherally.  Fluconazole 4/26; 4/27 >> Cipro 4/25 x 1 Zosyn 4/25 >> Micafungin 4/26 x 1 Flagyl 4/26 >> 4/29, 5/3>> Vanc po 4/26 >> 4/27  5/3 Cdiff: POSITIVE 5/5 cdiff - negative 4/25 wound: candida albicans 4/25 blood: neg 4/25 mrsa: neg   Goal of Therapy:  Eradication of infection   Plan:  - Cont Zosyn 3.375 g IV q8h - Increase Fluconazole to 400/day    Erin Hearing PharmD., BCPS Clinical Pharmacist Pager [phone] 12/15/2014 10:41 AM

## 2014-12-15 NOTE — Progress Notes (Signed)
Received call from [facility], orders received, reviewing for approval.

## 2014-12-15 NOTE — Progress Notes (Signed)
Spoke with Alexa at [facility], updated that pt to go home no sooner than Monday, finishing Abx, and [facility] following for [facility] needs.

## 2014-12-15 NOTE — Progress Notes (Signed)
Spoke with Ubaldo Glassing from [facility], Home VAC to be dropped off to pt's room today 12-15-14.

## 2014-12-15 NOTE — Progress Notes (Signed)
CRITICAL VALUE ALERT  Critical value received:  Hemoglobin   Date of notification:  12/15/2014  Time of notification:  2751  Critical value read back:Yes.    Nurse who received alert:  Clayborn Bigness, RN  MD notified (1st page):  Schorr, NP  Time of first page:  [phone]  MD notified (2nd page):  Time of second page:  Responding MD:  Schorr  Time MD responded:  [phone]

## 2014-12-16 DIAGNOSIS — D649 Anemia, unspecified: Secondary | ICD-10-CM

## 2014-12-16 DIAGNOSIS — R601 Generalized edema: Secondary | ICD-10-CM | POA: Diagnosis present

## 2014-12-16 LAB — COMPREHENSIVE METABOLIC PANEL
ALT: 11 U/L — AB (ref 17–63)
AST: 35 U/L (ref 15–41)
Albumin: <1 g/dL — ABNORMAL LOW (ref 3.5–5.0)
Alkaline Phosphatase: 540 U/L — ABNORMAL HIGH (ref 38–126)
Anion gap: 9 (ref 5–15)
BUN: 20 mg/dL (ref 6–20)
CALCIUM: 6.9 mg/dL — AB (ref 8.9–10.3)
CO2: 19 mmol/L — ABNORMAL LOW (ref 22–32)
Chloride: 110 mmol/L (ref 101–111)
Creatinine, Ser: 1.69 mg/dL — ABNORMAL HIGH (ref 0.61–1.24)
GFR calc Af Amer: 56 mL/min — ABNORMAL LOW (ref >60)
GFR, EST NON AFRICAN AMERICAN: 48 mL/min — AB (ref >60)
GLUCOSE: 77 mg/dL (ref 70–99)
Potassium: 4.9 mmol/L (ref 3.5–5.1)
Sodium: 138 mmol/L (ref 135–145)
TOTAL PROTEIN: 4.9 g/dL — AB (ref 6.5–8.1)
Total Bilirubin: 0.7 mg/dL (ref 0.3–1.2)

## 2014-12-16 LAB — GLUCOSE, CAPILLARY
GLUCOSE-CAPILLARY: 92 mg/dL (ref 70–99)
Glucose-Capillary: 110 mg/dL — ABNORMAL HIGH (ref 70–99)
Glucose-Capillary: 89 mg/dL (ref 70–99)
Glucose-Capillary: 88 mg/dL (ref 70–99)

## 2014-12-16 LAB — CBC WITH DIFFERENTIAL/PLATELET
BASOS PCT: 0 % (ref 0–1)
Basophils Absolute: 0.1 10*3/uL (ref 0.0–0.1)
Eosinophils Absolute: 1 10*3/uL — ABNORMAL HIGH (ref 0.0–0.7)
Eosinophils Relative: 4 % (ref 0–5)
HCT: 24.4 % — ABNORMAL LOW (ref 39.0–52.0)
HEMOGLOBIN: 7.6 g/dL — AB (ref 13.0–17.0)
Lymphocytes Relative: 9 % — ABNORMAL LOW (ref 12–46)
Lymphs Abs: 2.2 10*3/uL (ref 0.7–4.0)
MCH: 27.9 pg (ref 26.0–34.0)
MCHC: 31.1 g/dL (ref 30.0–36.0)
MCV: 89.7 fL (ref 78.0–100.0)
MONO ABS: 1 10*3/uL (ref 0.1–1.0)
MONOS PCT: 4 % (ref 3–12)
NEUTROS ABS: 18.6 10*3/uL — AB (ref 1.7–7.7)
NEUTROS PCT: 82 % — AB (ref 43–77)
PLATELETS: 505 10*3/uL — AB (ref 150–400)
RBC: 2.72 MIL/uL — AB (ref 4.22–5.81)
RDW: 19.6 % — ABNORMAL HIGH (ref 11.5–15.5)
WBC: 22.8 10*3/uL — AB (ref 4.0–10.5)

## 2014-12-16 LAB — PHOSPHORUS: Phosphorus: 3.4 mg/dL (ref 2.5–4.6)

## 2014-12-16 LAB — MAGNESIUM: MAGNESIUM: 1.3 mg/dL — AB (ref 1.7–2.4)

## 2014-12-16 MED ORDER — PRO-STAT SUGAR FREE PO LIQD
30.0000 mL | Freq: Three times a day (TID) | ORAL | Status: DC
  Administered 2014-12-16 – 2014-12-20 (×12): 30 mL via ORAL
  Filled 2014-12-16 (×15): qty 30

## 2014-12-16 MED ORDER — MAGNESIUM SULFATE 2 GM/50ML IV SOLN
2.0000 g | Freq: Once | INTRAVENOUS | Status: AC
  Administered 2014-12-16: 2 g via INTRAVENOUS
  Filled 2014-12-16: qty 50

## 2014-12-16 NOTE — Progress Notes (Signed)
Progress Note   Vincent Black ERD:[id number] DOB: 1971/06/05 DOA: 12/04/2014 PCP: No PCP Per Patient   Brief Narrative:   Vincent Black is an 44 y.o. male with a PMH of anxiety, ankylosing spondylitis, Crohn's disease and Clostridium difficile colitis who was admitted 12/04/14 the chief complaint of worsening abdominal pain, found to have a perforated sigmoid colon and perforated cecal toxic megacolon as well as small bowel fistula. He underwent exploratory laparotomy/small bowel resection with anastomosis and total abdominal colectomy with end ileostomy on 12/04/14.  Assessment/Plan:   Principal Problem: Septic shock secondary to Perforated sigmoid colon, perforated cecal toxic megacolon, and small bowel fistula in the setting of Crohn's disease  - s/p ex lap and SBR with anastomosis, total abdominal colectomy with end ileostomy (4/25 - Dr. Donne Hazel). - Continue antibiotics per ID recommendations: 2 weeks of therapy with Zosyn/fluconazole. - Due to problems with nausea, Flagyl discontinued early per surgical recommendations. Repeat C. difficile PCR negative. - Wound cultures positive for Candida albicans, continue Diflucan. Blood cultures negative. - GI, surgery and ID following.  Active problems:  Anasarca - Decrease IV fluids. - Albumin less than 1. Increase pro-stat to 3 times a day.  Hypomagnesemia - Give 2 g of magnesium sulfate.  Hyperkalemia - Potassium supplementation in IV fluids discontinued and potassium WNL after receiving Kayexalate 30 g 1 on 12/14/14.  Severe protein calorie malnutrition / unintentional weight loss - The patient has lost over 50 pounds in the past 6 months. - Continue nutritional supplements. - Continue Remeron for appetite stimulation.  C.diff colitis  - Repeat C diff negative.  - Completed a course of Flagyl and is status post colectomy.  Sinus tachycardia - Improved with treatment of sepsis.  Acute renal failure/Decreased uop -  Prerenal in etiology with significant third spacing of fluid. - Creatinine stable around 1.6 over the past several days.  Normocytic Anemia - chronic - Hemoglobin 6.3 mg/dL 12/15/14, received 1 unit of packed red blood cells. - Hemoglobin listed as 3.8 mg/dL this morning, repeated stat and was 7.6 on recheck.  Thrombocytosis  - Persistent, continue to monitor.  Leukocytosis - Secondary to sepsis. Up today. Repeat C. difficile PCR negative.  Acute on chronic pain uncontrolled  - Continue Robaxin, MS Contin and OxyIR for breakthrough pain.  Anxiety -PRN Ativan.  DVT Prophylaxis - Continue subcutaneous heparin.  Code Status: Full. Family Communication: Wilhemena Durie, wife, updated at bedside.   Disposition Plan: SNF for rehab versus home with PT when medically stable and course of IV antibiotics with Zosyn completed, likely 2-3 days.   IV Access:    CVC right internal jugular placed 12/04/14   Procedures and diagnostic studies:   Dg Chest 1 View  12/04/2014   CLINICAL DATA:  Fever. Nasogastric tube placement. Crohn disease. Anxiety.  EXAM: CHEST  1 VIEW  COMPARISON:  CT of 11/17/2014  FINDINGS: Nasogastric terminates at the body of the stomach. Suspect remote anterior right rib trauma including at the second and possibly the third anterior right ribs. Midline trachea. Normal heart size. No pleural fluid. Low lung volumes. Patchy bibasilar atelectasis. Free intraperitoneal air under both hemidiaphragms.  IMPRESSION: 1. Appropriate position of nasogastric tube. 2. Extensive free intraperitoneal air. Critical test results telephoned toMaggie, r.n. at the time of interpretation at 4:40 p.m.on . 12/04/2014.   Electronically Signed   By: Abigail Miyamoto M.D.   On: 12/04/2014 16:38   Ct Abdomen Pelvis W Contrast  11/17/2014   CLINICAL DATA:  Crohn's  disease. Pericolonic abscess drained on 03/18, output about 20 cc a day. Pain in vicinity of drain.  EXAM: CT CHEST, ABDOMEN, AND PELVIS WITH CONTRAST   TECHNIQUE: Multidetector CT imaging of the chest, abdomen and pelvis was performed following the standard protocol during bolus administration of intravenous contrast.  CONTRAST:  135mL OMNIPAQUE IOHEXOL 300 MG/ML  SOLN  COMPARISON:  11/03/2014  FINDINGS: Lower chest:  Stable scarring in both lower lobes.  Hepatobiliary: Stable 7 mm in long axis hypodense lesion in segment 6, image 33 series 2. Gallbladder unremarkable.  Pancreas: Unremarkable  Spleen: Unremarkable  Adrenals/Urinary Tract: Unremarkable  Stomach/Bowel: Small locules of extraluminal gas and mild stranding at the site of the pigtail catheter adjacent to a loop of small bowel and the sigmoid colon. There was previously an abscess filled with fluid and gas; currently there is only several locules of gas along with inflammatory stranding in this vicinity. Several extraluminal locules of gas are present below the level of the pigtail catheter, for example on image 73 of series 2. There is abnormal wall thickening and wall irregularity in the distal descending colon and sigmoid colon with a full-thickness ulceration of the sigmoid colon wall on image 73 of series 2 anteriorly. Obvious spillage of extraluminal contrast is not seen. The pigtail was formed in the immediate vicinity of a loop of small bowel. Scattered loops of mildly dilated small bowel noted. There stranding around the appendix which is at the upper limits of normal in size.  Vascular/Lymphatic: Small retroperitoneal and mesenteric lymph nodes are probably reactive.  Reproductive: Unremarkable  Other: No supplemental non-categorized findings.  Musculoskeletal: Fused sacroiliac joints as can be encountered in the setting of inflammatory bowel disease or ankylosing spondylitis. Multilevel bridging spurring in the lower thoracic and upper lumbar spine.  IMPRESSION: 1. Although the main body of the abscess has been successfully drained, there continue to be locules of extraluminal gas not only at  the pigtail but also along the anterior sigmoid colon where there appears to be a full-thickness ulceration in the colon wall anteriorly along the marked wall thickening. The abnormal wall thickening extends from the descending colon to the rectum but is most severe in the proximal half of the sigmoid colon. There surrounding inflammatory stranding as well. 2. The pigtail was also formed in the immediate vicinity of a loop of small bowel which almost appears to pass through the pigtail loop although this may be misleading. 3. Upper normal wall thickness of the appendix with low-grade adjacent inflammatory stranding. This is not to a level to be definitive for acute appendicitis but may merit observation. 4. Scattered mildly dilated loops of small bowel with air-fluid levels suggesting ileus. Scattered mesenteric and very rectal lymph nodes are likely inflammatory. 5. 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   Electronically Signed   By: Van Clines M.D.   On: 11/17/2014 11:28   Ir Sinus/fist Tube Chk-non Gi  11/28/2014   CLINICAL DATA:  History of Crohn's disease and colonic perforation with development of an intraperitoneal abscess.  Patient underwent CT-guided percutaneous drainage catheter placement on 10/27/2014 with subsequent CT scan performed 11/17/2014 demonstrating near complete resolution of the abdominal abscess. Contrast injection of the percutaneous drain also performed 11/17/2014 demonstrated a persistent fistulous connection with the adjacent bowel.  Patient returns to the Interventional Radiology Clinic for repeat fluoroscopic guided percutaneous drainage catheter injection. The patient reports minimal to no output from the percutaneous drainage  catheter. The patient has not been flushing the percutaneous drain.  EXAM: SINUS TRACT INJECTION/FISTULOGRAM  COMPARISON:  CT abdomen and pelvis- 11/17/2014;  11/03/2014; 10/26/2014; CT-guided percutaneous drainage catheter placement -10/27/2014 ; fluoroscopic guided percutaneous drainage catheter injection- 11/17/2014  CONTRAST:  10 mL OMNIPAQUE IOHEXOL 300 MG/ML SOLN - injected via the existing left lower quadrant percutaneous drainage catheter.  FLUOROSCOPY TIME:  1 minute, 18 seconds (33.8 mGy).  TECHNIQUE: Patient was placed supine on the fluoroscopy table.  A preprocedural spot fluoroscopic image was obtained of the left lower abdominal quadrant and existing percutaneous drainage catheter.  Multiple spot fluoroscopic and radiographic images were obtained in various obliquities following the injection of a small amount of contrast via the percutaneous drainage catheter.  Images were reviewed and the procedure was terminated. The drainage catheter was flushed with a small amount of saline and reconnected to a gravity bag. A dressing was placed. The patient tolerated the procedure well without immediate postprocedural complication.  FINDINGS: Preprocedural spot fluoroscopic image demonstrates unchanged positioning of the percutaneous drainage catheter overlying left lower abdominal quadrant.  Contrast injection confirms a persistent fistulous connection with an adjacent loop of small bowel.  As such, the drainage catheter was reconnected to a gravity bag.  IMPRESSION: Persistent fistulous connection from the percutaneous drainage catheter to an adjacent loop of small bowel.  PLAN: Patient will return to the interventional radiology drain Clinic on 12/12/2014 for repeat fluoroscopic guided percutaneous drainage catheter injection. If a fistulous connection persists, surgical revision may have to be considered.   Electronically Signed   By: Sandi Mariscal M.D.   On: 11/28/2014 14:25   Ir Sinus/fist Tube Chk-non Gi  11/17/2014   CLINICAL DATA:  COLONIC DIVERTICULAR ABSCESS, STATUS POST PERCUTANEOUS DRAINAGE 10/27/2014  EXAM: FLUOROSCOPIC INJECTION OF THE EXISTING LEFT  LOWER QUADRANT ABSCESS DRAIN  Date:  4/8/20164/03/2015 12:38 pm  Radiologist:  M. Daryll Brod, MD  MEDICATIONS AND MEDICAL HISTORY: NONE.  COMPLICATIONS: NONE IMMEDIATE  PROCEDURE: Informed consent was obtained from the patient following explanation of the procedure, risks, benefits and alternatives. The patient understands, agrees and consents for the procedure. All questions were addressed. A time out was performed.  UNDER STERILE CONDITIONS, THE EXISTING DRAIN CATHETER WAS INJECTED WITH CONTRAST UNDER FLUOROSCOPY. IMAGES OBTAINED FOR DOCUMENTATION. THE ABSCESS CAVITY IS COLLAPSED. THIS CORRELATES WITH THE CT. THERE IS A SMALL ADJACENT FISTULA FROM THE COLLAPSED ABSCESS CAVITY TO AN ADJACENT SMALL BOWEL LOOP.  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  PLAN: Exchange suction bulb for a gravity bag. Outpatient repeat injection on 11/28/2014   Electronically Signed   By: Jerilynn Mages.  Shick M.D.   On: 11/17/2014 12:49   Dg Chest Port 1 View  12/07/2014   CLINICAL DATA:  Pulmonary edema.  EXAM: PORTABLE CHEST - 1 VIEW  COMPARISON:  12/06/2014  FINDINGS: Low lung volumes with bibasilar atelectasis and vascular congestion, similar to prior study. Lucency under the right hemidiaphragm again noted, possibly pneumoperitoneum. NG tube and right central line remain in place, unchanged.  IMPRESSION: Low lung volumes with bibasilar atelectasis.  Lucency under the right hemidiaphragm again noted, possibly pneumoperitoneum related to recent surgery. May consider decubitus view of the abdomen to assess degree of pneumoperitoneum if felt clinically indicated.   Electronically Signed   By: Rolm Baptise M.D.   On: 12/07/2014 08:50   Dg Chest Port 1 View  12/06/2014   CLINICAL DATA:  Edema  EXAM: PORTABLE CHEST -  1 VIEW  COMPARISON:  12/04/2014  FINDINGS: Low lung volumes with vascular crowding. Possible mild interstitial edema with bibasilar atelectasis. No  pneumothorax.  The heart is normal in size.  Right IJ venous catheter terminates at the cavoatrial junction.  Enteric tube traverses in the distal gastric antrum.  Lucency beneath the right hemidiaphragm could suggest free air related to recent surgery.  IMPRESSION: Low lung volumes with vascular crowding and possible mild interstitial edema.  Bibasilar atelectasis.  Support apparatus as above.   Electronically Signed   By: Julian Hy M.D.   On: 12/06/2014 09:45   Dg Chest Port 1 View  12/04/2014   CLINICAL DATA:  Central line and NG tube placement.  EXAM: PORTABLE CHEST - 1 VIEW  COMPARISON:  Earlier same day  FINDINGS: Grossly unchanged cardiac silhouette and mediastinal contours. Interval placement of a right jugular approach intravenous catheter with tip projected of the superior cavoatrial junction. Enteric tube tip and side port projects over the expected location of the gastric antrum. No pneumothorax. Apparent resolution of previously noted pneumoperitoneum. Improved aeration of lung bases without focal airspace opacity. No pleural effusion or pneumothorax. Unchanged bones.  IMPRESSION: 1. Appropriately positioned support apparatus as above. No pneumothorax. 2. Apparent resolution of previously noted large amount of pneumoperitoneum. 3. Improved aeration lungs with resolved bibasilar atelectasis.   Electronically Signed   By: Sandi Mariscal M.D.   On: 12/04/2014 21:19   Dg Abd Portable 1v  12/07/2014   CLINICAL DATA:  Nasogastric tube placement.  Initial encounter.  EXAM: PORTABLE ABDOMEN - 1 VIEW  COMPARISON:  Abdominal radiograph performed 12/04/2014  FINDINGS: The patient's enteric tube is noted ending overlying the body of the stomach.  The visualized bowel gas pattern is unremarkable. A bowel suture line is noted at the left lower quadrant. An ostomy is seen at the right lower quadrant. Scattered air and stool filled loops of colon are seen; no abnormal dilatation of small bowel loops is seen  to suggest small bowel obstruction. No free intra-abdominal air is identified, though evaluation for free air is limited on a single supine view.  The visualized osseous structures are within normal limits; the sacroiliac joints are unremarkable in appearance.  A drainage catheter is noted overlying the upper pelvis.  IMPRESSION: 1. Enteric tube noted ending overlying the body of the stomach. 2. Unremarkable bowel gas pattern; postoperative change at the lower quadrants. No free intra-abdominal air seen.   Electronically Signed   By: Garald Balding M.D.   On: 12/07/2014 06:32   Dg Abd Portable 1v  12/04/2014   CLINICAL DATA:  Nasogastric tube placement.  EXAM: PORTABLE ABDOMEN - 1 VIEW  COMPARISON:  Abdominal CT 11/17/2014  FINDINGS: There is large pneumoperitoneum with Rigler's sign and outlining of the falciform ligament. The colon is distended with high-density material, likely retained contrast from comparison CT. Small bowel dilatation.  Nasogastric tube is in good position. There is a left lower quadrant percutaneous abscess drain.  Critical Value/emergent results were called by telephone at the time of interpretation on 12/04/2014 at 4:50 pm to Dr. [name] , who verbally acknowledged these results.  IMPRESSION: 1. Large pneumoperitoneum consistent with bowel perforation. 2. Small bowel and colonic distention which could be from low colonic obstruction or ileus. 3. Nasogastric tube is in good position. 4. Left lower quadrant percutaneous abscess drain.   Electronically Signed   By: Monte Fantasia M.D.   On: 12/04/2014 16:51     Medical Consultants:  Meredith Staggers, MD, Physical Medicine and Rehabilitation  Carol Ada, MD, Gastroenterology  Simonne Maffucci, MD, PCCM  Rolm Bookbinder, MD, General Surgery   Anti-Infectives:   Total days of antibiotics: 8  C diff therapy: day 7 (po vanco --> flagyl)  Zosyn 4/25 > 12/18/14  Flagyl 4/26 > 12/14/14  Fluconazole 4/27 >  12/20/14  Received mycafungin prior to admission  Subjective:   Vincent Black is concerned about all the swelling in his feet and legs. Continues to have some abdominal discomfort and occasional nausea but nausea much better since he has been off the Flagyl. No dyspnea.  Objective:    Filed Vitals:   12/15/14 2135 12/16/14 0512 12/16/14 1113 12/16/14 1457  BP:  134/104 136/104 145/103  Pulse: 113 109  107  Temp:  98.9 F (37.2 C)  98.8 F (37.1 C)  TempSrc:  Oral  Oral  Resp:  18    Height:      Weight:      SpO2:  96%  97%    Intake/Output Summary (Last 24 hours) at 12/16/14 1543 Last data filed at 12/16/14 1448  Gross per 24 hour  Intake   3310 ml  Output   4275 ml  Net   -965 ml    Exam: Gen:  NAD, weak Cardiovascular:  Mildly tachycardic, No M/R/G Respiratory:  Lungs CTAB Gastrointestinal:  Abdomen soft, midline abdominal incision with dressings in place, ostomy right lower quadrant with brown stool present. JP drain now out, slight erythema around puncture site. Extremities:  2+ pitting edema bilaterally   Data Reviewed:    Labs: Basic Metabolic Panel:  Recent Labs Lab 12/12/14 0356 12/13/14 0430 12/14/14 0505 12/15/14 0305 12/16/14 0515  NA 139 141 142 143 138  K 4.2 4.9 5.7* 4.8 4.9  CL 111 111 115* 115* 110  CO2 20* 20* 20* 20* 19*  GLUCOSE 111* 83 66* 80 77  BUN 19 20 17 17 20   CREATININE 1.68* 1.65* 1.65* 1.63* 1.69*  CALCIUM 6.9* 7.0* 7.1* 7.2* 6.9*  MG  --  1.6* 1.5* 1.4* 1.3*  PHOS  --  2.7 3.2 3.4 3.4   GFR Estimated Creatinine Clearance: 56.9 mL/min (by C-G formula based on Cr of 1.69). Liver Function Tests:  Recent Labs Lab 12/13/14 0430 12/14/14 0505 12/15/14 0305 12/16/14 0515  AST 28 34 20 35  ALT 10* 11* 11* 11*  ALKPHOS 790* 749* 651* 540*  BILITOT 0.4 0.6 0.3 0.7  PROT 4.6* 4.7* 5.0* 4.9*  ALBUMIN <1.0* <1.0* <1.0* <1.0*   CBC:  Recent Labs Lab 12/13/14 0430 12/14/14 0505 12/15/14 0305 12/16/14 0515  12/16/14 0850  WBC 21.9* 20.5* 21.5* 32.8* 22.8*  NEUTROABS 17.7* 17.1* 17.9* 26.9* 18.6*  HGB 7.0* 7.0* 6.3* 3.8* 7.6*  HCT 23.1* 23.5* 21.5* 11.9* 24.4*  MCV 91.3 91.4 92.7 90.8 89.7  PLT 459* 485* 507* 586* 505*   CBG:  Recent Labs Lab 12/15/14 1145 12/15/14 1702 12/15/14 2221 12/16/14 0750 12/16/14 1210  GLUCAP 100* 127* 91 110* 92   Microbiology Recent Results (from the past 240 hour(s))  Clostridium Difficile by PCR     Status: Abnormal   Collection Time: 12/12/14 10:19 AM  Result Value Ref Range Status   C difficile by pcr POSITIVE (A) NEGATIVE Final    Comment: CRITICAL RESULT CALLED TO, READ BACK BY AND VERIFIED WITH: Bethel Born RN 12:00 12/12/14 ([email or web address])   Clostridium Difficile by PCR     Status: None   Collection Time: 12/14/14  3:14 PM  Result Value Ref Range Status   C difficile by pcr  NEGATIVE Final    TEST PERFORMED ONCE PER 72 HOURS/CREDITED PENDING REORDER 12/15/14     Medications:   . sodium chloride   Intravenous Once  . antiseptic oral rinse  7 mL Mouth Rinse q12n4p  . dextrose  25 mL Intravenous Once  . feeding supplement (ENSURE ENLIVE)  237 mL Oral BID BM  . feeding supplement (PRO-STAT SUGAR FREE 64)  30 mL Oral BID WC  . fluconazole (DIFLUCAN) IV  400 mg Intravenous Q24H  . heparin subcutaneous  5,000 Units Subcutaneous 3 times per day  . lip balm  1 application Topical BID  . methocarbamol  500 mg Oral TID  . metoprolol tartrate  12.5 mg Oral BID  . mirtazapine  15 mg Oral QHS  . morphine  15 mg Oral Q12H  . piperacillin-tazobactam (ZOSYN)  IV  3.375 g Intravenous Q8H  . prednisoLONE acetate  1 drop Both Eyes BID  . saccharomyces boulardii  250 mg Oral BID   Continuous Infusions: . dextrose 5 % and 0.9% NaCl 100 mL/hr at 12/16/14 1122    Time spent: 25 minutes.    LOS: 12 days   [facility] Pager [phone]. If unable to reach me by pager, please call my cell phone at [phone].  *Please refer to  [email or web address], password [email or web address] to get updated schedule on who will round on this patient, as hospitalists switch teams weekly. If 7PM-7AM, please contact night-coverage at [email or web address], password [email or web address] for any overnight needs.  12/16/2014, 3:43 PM

## 2014-12-17 LAB — CBC WITH DIFFERENTIAL/PLATELET
BASOS PCT: 0 % (ref 0–1)
Basophils Absolute: 0 K/uL (ref 0.0–0.1)
Basophils Absolute: 0.1 10*3/uL (ref 0.0–0.1)
Basophils Relative: 0 % (ref 0–1)
Eosinophils Absolute: 1 K/uL — ABNORMAL HIGH (ref 0.0–0.7)
Eosinophils Absolute: 0.7 10*3/uL (ref 0.0–0.7)
Eosinophils Relative: 3 % (ref 0–5)
Eosinophils Relative: 4 % (ref 0–5)
HCT: 11.9 % — ABNORMAL LOW (ref 39.0–52.0)
HEMATOCRIT: 23.5 % — AB (ref 39.0–52.0)
HEMOGLOBIN: 7.4 g/dL — AB (ref 13.0–17.0)
Hemoglobin: 3.8 g/dL — CL (ref 13.0–17.0)
LYMPHS PCT: 10 % — AB (ref 12–46)
Lymphocytes Relative: 10 % — ABNORMAL LOW (ref 12–46)
Lymphs Abs: 3.3 K/uL (ref 0.7–4.0)
Lymphs Abs: 1.9 10*3/uL (ref 0.7–4.0)
MCH: 29 pg (ref 26.0–34.0)
MCH: 28.1 pg (ref 26.0–34.0)
MCHC: 31.9 g/dL (ref 30.0–36.0)
MCHC: 31.5 g/dL (ref 30.0–36.0)
MCV: 90.8 fL (ref 78.0–100.0)
MCV: 89.4 fL (ref 78.0–100.0)
MONOS PCT: 5 % (ref 3–12)
Monocytes Absolute: 1.6 K/uL — ABNORMAL HIGH (ref 0.1–1.0)
Monocytes Absolute: 0.9 10*3/uL (ref 0.1–1.0)
Monocytes Relative: 5 % (ref 3–12)
NEUTROS ABS: 15.7 10*3/uL — AB (ref 1.7–7.7)
NEUTROS PCT: 81 % — AB (ref 43–77)
Neutro Abs: 26.9 K/uL — ABNORMAL HIGH (ref 1.7–7.7)
Neutrophils Relative %: 82 % — ABNORMAL HIGH (ref 43–77)
Platelets: 586 K/uL — ABNORMAL HIGH (ref 150–400)
Platelets: 551 10*3/uL — ABNORMAL HIGH (ref 150–400)
RBC: 1.31 MIL/uL — ABNORMAL LOW (ref 4.22–5.81)
RBC: 2.63 MIL/uL — ABNORMAL LOW (ref 4.22–5.81)
RDW: 19.9 % — ABNORMAL HIGH (ref 11.5–15.5)
RDW: 19.8 % — ABNORMAL HIGH (ref 11.5–15.5)
WBC: 32.8 K/uL — ABNORMAL HIGH (ref 4.0–10.5)
WBC: 19.2 10*3/uL — AB (ref 4.0–10.5)

## 2014-12-17 LAB — GLUCOSE, CAPILLARY
GLUCOSE-CAPILLARY: 90 mg/dL (ref 70–99)
Glucose-Capillary: 88 mg/dL (ref 70–99)
Glucose-Capillary: 110 mg/dL — ABNORMAL HIGH (ref 70–99)
Glucose-Capillary: 81 mg/dL (ref 70–99)

## 2014-12-17 LAB — COMPREHENSIVE METABOLIC PANEL
ALT: 9 U/L — ABNORMAL LOW (ref 17–63)
AST: 16 U/L (ref 15–41)
Albumin: <1 g/dL — ABNORMAL LOW (ref 3.5–5.0)
Alkaline Phosphatase: 449 U/L — ABNORMAL HIGH (ref 38–126)
Anion gap: 10 (ref 5–15)
BUN: 20 mg/dL (ref 6–20)
CALCIUM: 7.3 mg/dL — AB (ref 8.9–10.3)
CO2: 20 mmol/L — AB (ref 22–32)
CREATININE: 1.63 mg/dL — AB (ref 0.61–1.24)
Chloride: 109 mmol/L (ref 101–111)
GFR, EST AFRICAN AMERICAN: 59 mL/min — AB (ref >60)
GFR, EST NON AFRICAN AMERICAN: 50 mL/min — AB (ref >60)
GLUCOSE: 86 mg/dL (ref 70–99)
Potassium: 4 mmol/L (ref 3.5–5.1)
Sodium: 139 mmol/L (ref 135–145)
Total Bilirubin: 0.4 mg/dL (ref 0.3–1.2)
Total Protein: 5 g/dL — ABNORMAL LOW (ref 6.5–8.1)

## 2014-12-17 LAB — MAGNESIUM: Magnesium: 1.6 mg/dL — ABNORMAL LOW (ref 1.7–2.4)

## 2014-12-17 LAB — PHOSPHORUS: Phosphorus: 3.4 mg/dL (ref 2.5–4.6)

## 2014-12-17 MED ORDER — MAGNESIUM SULFATE 2 GM/50ML IV SOLN
2.0000 g | Freq: Once | INTRAVENOUS | Status: AC
  Administered 2014-12-17: 2 g via INTRAVENOUS
  Filled 2014-12-17: qty 50

## 2014-12-17 NOTE — Progress Notes (Signed)
Progress Note   Vincent Black TDV:[id number] DOB: 29-May-1971 DOA: 12/04/2014 PCP: No PCP Per Patient   Brief Narrative:   Vincent Black is an 44 y.o. male with a PMH of anxiety, ankylosing spondylitis, Crohn's disease and Clostridium difficile colitis who was admitted 12/04/14 the chief complaint of worsening abdominal pain, found to have a perforated sigmoid colon and perforated cecal toxic megacolon as well as small bowel fistula. He underwent exploratory laparotomy/small bowel resection with anastomosis and total abdominal colectomy with end ileostomy on 12/04/14.  Assessment/Plan:   Principal Problem: Septic shock secondary to Perforated sigmoid colon, perforated cecal toxic megacolon, and small bowel fistula in the setting of Crohn's disease  - s/p ex lap and SBR with anastomosis, total abdominal colectomy with end ileostomy (4/25 - Dr. Donne Hazel). - Continue antibiotics per ID recommendations: 2 weeks of therapy with Zosyn/fluconazole. - Due to problems with nausea, Flagyl discontinued early per surgical recommendations. Repeat C. difficile PCR negative. - Wound cultures positive for Candida albicans, continue Diflucan. Blood cultures negative. - GI, surgery and ID following.  Active problems:  Anasarca - Decrease IV fluids. - Albumin less than 1. Increased pro-stat to 3 times a day 12/16/14.  Hypomagnesemia - Given 2 g of magnesium sulfate 12/16/14.  Repeat.  Hyperkalemia - Potassium supplementation in IV fluids discontinued and potassium WNL after receiving Kayexalate 30 g 1 on 12/14/14.  Severe protein calorie malnutrition / unintentional weight loss - The patient has lost over 50 pounds in the past 6 months. - Continue nutritional supplements. - Continue Remeron for appetite stimulation.  C.diff colitis  - Repeat C diff negative.  - Completed a course of Flagyl and is status post colectomy.  Sinus tachycardia - Improved with treatment of sepsis.  Acute renal  failure/Decreased uop - Prerenal in etiology with significant third spacing of fluid. - Creatinine stable around 1.6 over the past several days.  Normocytic Anemia - chronic - Hemoglobin 6.3 mg/dL 12/15/14, received 1 unit of packed red blood cells. - Hemoglobin 7.4 today.  Thrombocytosis  - Persistent, continue to monitor.  Leukocytosis - Secondary to sepsis. Up today. Repeat C. difficile PCR negative.  Acute on chronic pain uncontrolled  - Continue Robaxin, MS Contin and OxyIR for breakthrough pain.  Anxiety -PRN Ativan.  DVT Prophylaxis - Continue subcutaneous heparin.  Code Status: Full. Family Communication: Wilhemena Durie, wife, updated at bedside.   Disposition Plan: SNF for rehab versus home with PT when medically stable and course of IV antibiotics with Zosyn completed, likely 1-2 days.   IV Access:    CVC right internal jugular placed 12/04/14   Procedures and diagnostic studies:   Dg Chest 1 View  12/04/2014   CLINICAL DATA:  Fever. Nasogastric tube placement. Crohn disease. Anxiety.  EXAM: CHEST  1 VIEW  COMPARISON:  CT of 11/17/2014  FINDINGS: Nasogastric terminates at the body of the stomach. Suspect remote anterior right rib trauma including at the second and possibly the third anterior right ribs. Midline trachea. Normal heart size. No pleural fluid. Low lung volumes. Patchy bibasilar atelectasis. Free intraperitoneal air under both hemidiaphragms.  IMPRESSION: 1. Appropriate position of nasogastric tube. 2. Extensive free intraperitoneal air. Critical test results telephoned toMaggie, r.n. at the time of interpretation at 4:40 p.m.on . 12/04/2014.   Electronically Signed   By: Abigail Miyamoto M.D.   On: 12/04/2014 16:38   Ct Abdomen Pelvis W Contrast  11/17/2014   CLINICAL DATA:  Crohn's disease. Pericolonic abscess drained on 03/18, output  about 20 cc a day. Pain in vicinity of drain.  EXAM: CT CHEST, ABDOMEN, AND PELVIS WITH CONTRAST  TECHNIQUE: Multidetector CT imaging of  the chest, abdomen and pelvis was performed following the standard protocol during bolus administration of intravenous contrast.  CONTRAST:  147mL OMNIPAQUE IOHEXOL 300 MG/ML  SOLN  COMPARISON:  11/03/2014  FINDINGS: Lower chest:  Stable scarring in both lower lobes.  Hepatobiliary: Stable 7 mm in long axis hypodense lesion in segment 6, image 33 series 2. Gallbladder unremarkable.  Pancreas: Unremarkable  Spleen: Unremarkable  Adrenals/Urinary Tract: Unremarkable  Stomach/Bowel: Small locules of extraluminal gas and mild stranding at the site of the pigtail catheter adjacent to a loop of small bowel and the sigmoid colon. There was previously an abscess filled with fluid and gas; currently there is only several locules of gas along with inflammatory stranding in this vicinity. Several extraluminal locules of gas are present below the level of the pigtail catheter, for example on image 73 of series 2. There is abnormal wall thickening and wall irregularity in the distal descending colon and sigmoid colon with a full-thickness ulceration of the sigmoid colon wall on image 73 of series 2 anteriorly. Obvious spillage of extraluminal contrast is not seen. The pigtail was formed in the immediate vicinity of a loop of small bowel. Scattered loops of mildly dilated small bowel noted. There stranding around the appendix which is at the upper limits of normal in size.  Vascular/Lymphatic: Small retroperitoneal and mesenteric lymph nodes are probably reactive.  Reproductive: Unremarkable  Other: No supplemental non-categorized findings.  Musculoskeletal: Fused sacroiliac joints as can be encountered in the setting of inflammatory bowel disease or ankylosing spondylitis. Multilevel bridging spurring in the lower thoracic and upper lumbar spine.  IMPRESSION: 1. Although the main body of the abscess has been successfully drained, there continue to be locules of extraluminal gas not only at the pigtail but also along the anterior  sigmoid colon where there appears to be a full-thickness ulceration in the colon wall anteriorly along the marked wall thickening. The abnormal wall thickening extends from the descending colon to the rectum but is most severe in the proximal half of the sigmoid colon. There surrounding inflammatory stranding as well. 2. The pigtail was also formed in the immediate vicinity of a loop of small bowel which almost appears to pass through the pigtail loop although this may be misleading. 3. Upper normal wall thickness of the appendix with low-grade adjacent inflammatory stranding. This is not to a level to be definitive for acute appendicitis but may merit observation. 4. Scattered mildly dilated loops of small bowel with air-fluid levels suggesting ileus. Scattered mesenteric and very rectal lymph nodes are likely inflammatory. 5. 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   Electronically Signed   By: Van Clines M.D.   On: 11/17/2014 11:28   Ir Sinus/fist Tube Chk-non Gi  11/28/2014   CLINICAL DATA:  History of Crohn's disease and colonic perforation with development of an intraperitoneal abscess.  Patient underwent CT-guided percutaneous drainage catheter placement on 10/27/2014 with subsequent CT scan performed 11/17/2014 demonstrating near complete resolution of the abdominal abscess. Contrast injection of the percutaneous drain also performed 11/17/2014 demonstrated a persistent fistulous connection with the adjacent bowel.  Patient returns to the Interventional Radiology Clinic for repeat fluoroscopic guided percutaneous drainage catheter injection. The patient reports minimal to no output from the percutaneous drainage catheter. The patient has not been flushing  the percutaneous drain.  EXAM: SINUS TRACT INJECTION/FISTULOGRAM  COMPARISON:  CT abdomen and pelvis- 11/17/2014; 11/03/2014; 10/26/2014; CT-guided  percutaneous drainage catheter placement -10/27/2014 ; fluoroscopic guided percutaneous drainage catheter injection- 11/17/2014  CONTRAST:  10 mL OMNIPAQUE IOHEXOL 300 MG/ML SOLN - injected via the existing left lower quadrant percutaneous drainage catheter.  FLUOROSCOPY TIME:  1 minute, 18 seconds (33.8 mGy).  TECHNIQUE: Patient was placed supine on the fluoroscopy table.  A preprocedural spot fluoroscopic image was obtained of the left lower abdominal quadrant and existing percutaneous drainage catheter.  Multiple spot fluoroscopic and radiographic images were obtained in various obliquities following the injection of a small amount of contrast via the percutaneous drainage catheter.  Images were reviewed and the procedure was terminated. The drainage catheter was flushed with a small amount of saline and reconnected to a gravity bag. A dressing was placed. The patient tolerated the procedure well without immediate postprocedural complication.  FINDINGS: Preprocedural spot fluoroscopic image demonstrates unchanged positioning of the percutaneous drainage catheter overlying left lower abdominal quadrant.  Contrast injection confirms a persistent fistulous connection with an adjacent loop of small bowel.  As such, the drainage catheter was reconnected to a gravity bag.  IMPRESSION: Persistent fistulous connection from the percutaneous drainage catheter to an adjacent loop of small bowel.  PLAN: Patient will return to the interventional radiology drain Clinic on 12/12/2014 for repeat fluoroscopic guided percutaneous drainage catheter injection. If a fistulous connection persists, surgical revision may have to be considered.   Electronically Signed   By: Sandi Mariscal M.D.   On: 11/28/2014 14:25   Ir Sinus/fist Tube Chk-non Gi  11/17/2014   CLINICAL DATA:  COLONIC DIVERTICULAR ABSCESS, STATUS POST PERCUTANEOUS DRAINAGE 10/27/2014  EXAM: FLUOROSCOPIC INJECTION OF THE EXISTING LEFT LOWER QUADRANT ABSCESS DRAIN  Date:   4/8/20164/03/2015 12:38 pm  Radiologist:  M. Daryll Brod, MD  MEDICATIONS AND MEDICAL HISTORY: NONE.  COMPLICATIONS: NONE IMMEDIATE  PROCEDURE: Informed consent was obtained from the patient following explanation of the procedure, risks, benefits and alternatives. The patient understands, agrees and consents for the procedure. All questions were addressed. A time out was performed.  UNDER STERILE CONDITIONS, THE EXISTING DRAIN CATHETER WAS INJECTED WITH CONTRAST UNDER FLUOROSCOPY. IMAGES OBTAINED FOR DOCUMENTATION. THE ABSCESS CAVITY IS COLLAPSED. THIS CORRELATES WITH THE CT. THERE IS A SMALL ADJACENT FISTULA FROM THE COLLAPSED ABSCESS CAVITY TO AN ADJACENT SMALL BOWEL LOOP.  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  PLAN: Exchange suction bulb for a gravity bag. Outpatient repeat injection on 11/28/2014   Electronically Signed   By: Jerilynn Mages.  Shick M.D.   On: 11/17/2014 12:49   Dg Chest Port 1 View  12/07/2014   CLINICAL DATA:  Pulmonary edema.  EXAM: PORTABLE CHEST - 1 VIEW  COMPARISON:  12/06/2014  FINDINGS: Low lung volumes with bibasilar atelectasis and vascular congestion, similar to prior study. Lucency under the right hemidiaphragm again noted, possibly pneumoperitoneum. NG tube and right central line remain in place, unchanged.  IMPRESSION: Low lung volumes with bibasilar atelectasis.  Lucency under the right hemidiaphragm again noted, possibly pneumoperitoneum related to recent surgery. May consider decubitus view of the abdomen to assess degree of pneumoperitoneum if felt clinically indicated.   Electronically Signed   By: Rolm Baptise M.D.   On: 12/07/2014 08:50   Dg Chest Port 1 View  12/06/2014   CLINICAL DATA:  Edema  EXAM: PORTABLE CHEST - 1 VIEW  COMPARISON:  12/04/2014  FINDINGS: Low lung volumes with vascular crowding. Possible mild interstitial edema with bibasilar atelectasis. No pneumothorax.  The heart is normal in size.   Right IJ venous catheter terminates at the cavoatrial junction.  Enteric tube traverses in the distal gastric antrum.  Lucency beneath the right hemidiaphragm could suggest free air related to recent surgery.  IMPRESSION: Low lung volumes with vascular crowding and possible mild interstitial edema.  Bibasilar atelectasis.  Support apparatus as above.   Electronically Signed   By: Julian Hy M.D.   On: 12/06/2014 09:45   Dg Chest Port 1 View  12/04/2014   CLINICAL DATA:  Central line and NG tube placement.  EXAM: PORTABLE CHEST - 1 VIEW  COMPARISON:  Earlier same day  FINDINGS: Grossly unchanged cardiac silhouette and mediastinal contours. Interval placement of a right jugular approach intravenous catheter with tip projected of the superior cavoatrial junction. Enteric tube tip and side port projects over the expected location of the gastric antrum. No pneumothorax. Apparent resolution of previously noted pneumoperitoneum. Improved aeration of lung bases without focal airspace opacity. No pleural effusion or pneumothorax. Unchanged bones.  IMPRESSION: 1. Appropriately positioned support apparatus as above. No pneumothorax. 2. Apparent resolution of previously noted large amount of pneumoperitoneum. 3. Improved aeration lungs with resolved bibasilar atelectasis.   Electronically Signed   By: Sandi Mariscal M.D.   On: 12/04/2014 21:19   Dg Abd Portable 1v  12/07/2014   CLINICAL DATA:  Nasogastric tube placement.  Initial encounter.  EXAM: PORTABLE ABDOMEN - 1 VIEW  COMPARISON:  Abdominal radiograph performed 12/04/2014  FINDINGS: The patient's enteric tube is noted ending overlying the body of the stomach.  The visualized bowel gas pattern is unremarkable. A bowel suture line is noted at the left lower quadrant. An ostomy is seen at the right lower quadrant. Scattered air and stool filled loops of colon are seen; no abnormal dilatation of small bowel loops is seen to suggest small bowel obstruction. No free  intra-abdominal air is identified, though evaluation for free air is limited on a single supine view.  The visualized osseous structures are within normal limits; the sacroiliac joints are unremarkable in appearance.  A drainage catheter is noted overlying the upper pelvis.  IMPRESSION: 1. Enteric tube noted ending overlying the body of the stomach. 2. Unremarkable bowel gas pattern; postoperative change at the lower quadrants. No free intra-abdominal air seen.   Electronically Signed   By: Garald Balding M.D.   On: 12/07/2014 06:32   Dg Abd Portable 1v  12/04/2014   CLINICAL DATA:  Nasogastric tube placement.  EXAM: PORTABLE ABDOMEN - 1 VIEW  COMPARISON:  Abdominal CT 11/17/2014  FINDINGS: There is large pneumoperitoneum with Rigler's sign and outlining of the falciform ligament. The colon is distended with high-density material, likely retained contrast from comparison CT. Small bowel dilatation.  Nasogastric tube is in good position. There is a left lower quadrant percutaneous abscess drain.  Critical Value/emergent results were called by telephone at the time of interpretation on 12/04/2014 at 4:50 pm to Dr. [name] , who verbally acknowledged these results.  IMPRESSION: 1. Large pneumoperitoneum consistent with bowel perforation. 2. Small bowel and colonic distention which could be from low colonic obstruction or ileus. 3. Nasogastric tube is in good position. 4. Left lower quadrant percutaneous abscess drain.   Electronically Signed   By: Monte Fantasia M.D.   On: 12/04/2014 16:51     Medical Consultants:    Meredith Staggers, MD, Physical Medicine  and Rehabilitation  Carol Ada, MD, Gastroenterology  Simonne Maffucci, MD, PCCM  Rolm Bookbinder, MD, General Surgery   Anti-Infectives:   Total days of antibiotics: 8  C diff therapy: day 7 (po vanco --> flagyl)  Zosyn 4/25 > 12/18/14  Flagyl 4/26 > 12/14/14  Fluconazole 4/27 > 12/20/14  Received mycafungin prior to  admission  Subjective:   Vincent Black reports nausea improved.  No vomiting.  Appetite improving.  Pain currently 6/10, improving.    Objective:    Filed Vitals:   12/16/14 1113 12/16/14 1457 12/16/14 2123 12/17/14 0521  BP: 136/104 145/103 132/100 133/99  Pulse:  107 113 112  Temp:  98.8 F (37.1 C) 98.8 F (37.1 C) 98.3 F (36.8 C)  TempSrc:  Oral Oral Oral  Resp:   18 18  Height:      Weight:      SpO2:  97% 99% 98%    Intake/Output Summary (Last 24 hours) at 12/17/14 0802 Last data filed at 12/17/14 5956  Gross per 24 hour  Intake 3845.17 ml  Output   4995 ml  Net -1149.83 ml    Exam: Gen:  NAD Cardiovascular:  Mildly tachycardic, No M/R/G Respiratory:  Lungs CTAB Gastrointestinal:  Abdomen soft, midline abdominal incision with dressings in place, ostomy right lower quadrant pink stoma, . JP drain now out, slight erythema around puncture site. Extremities:  2+ pitting edema bilaterally   Data Reviewed:    Labs: Basic Metabolic Panel:  Recent Labs Lab 12/13/14 0430 12/14/14 0505 12/15/14 0305 12/16/14 0515 12/17/14 0543  NA 141 142 143 138 139  K 4.9 5.7* 4.8 4.9 4.0  CL 111 115* 115* 110 109  CO2 20* 20* 20* 19* 20*  GLUCOSE 83 66* 80 77 86  BUN 20 17 17 20 20   CREATININE 1.65* 1.65* 1.63* 1.69* 1.63*  CALCIUM 7.0* 7.1* 7.2* 6.9* 7.3*  MG 1.6* 1.5* 1.4* 1.3* 1.6*  PHOS 2.7 3.2 3.4 3.4 3.4   GFR Estimated Creatinine Clearance: 59 mL/min (by C-G formula based on Cr of 1.63). Liver Function Tests:  Recent Labs Lab 12/13/14 0430 12/14/14 0505 12/15/14 0305 12/16/14 0515 12/17/14 0543  AST 28 34 20 35 16  ALT 10* 11* 11* 11* 9*  ALKPHOS 790* 749* 651* 540* 449*  BILITOT 0.4 0.6 0.3 0.7 0.4  PROT 4.6* 4.7* 5.0* 4.9* 5.0*  ALBUMIN <1.0* <1.0* <1.0* <1.0* <1.0*   CBC:  Recent Labs Lab 12/14/14 0505 12/15/14 0305 12/16/14 0515 12/16/14 0850 12/17/14 0543  WBC 20.5* 21.5* 32.8* 22.8* 19.2*  NEUTROABS 17.1* 17.9* 26.9* 18.6* 15.7*   HGB 7.0* 6.3* 3.8* 7.6* 7.4*  HCT 23.5* 21.5* 11.9* 24.4* 23.5*  MCV 91.4 92.7 90.8 89.7 89.4  PLT 485* 507* 586* 505* 551*   CBG:  Recent Labs Lab 12/15/14 2221 12/16/14 0750 12/16/14 1210 12/16/14 1642 12/16/14 2121  GLUCAP 91 110* 92 89 44   Microbiology Recent Results (from the past 240 hour(s))  Clostridium Difficile by PCR     Status: Abnormal   Collection Time: 12/12/14 10:19 AM  Result Value Ref Range Status   C difficile by pcr POSITIVE (A) NEGATIVE Final    Comment: CRITICAL RESULT CALLED TO, READ BACK BY AND VERIFIED WITH: Bethel Born RN 12:00 12/12/14 ([email or web address])   Clostridium Difficile by PCR     Status: None   Collection Time: 12/14/14  3:14 PM  Result Value Ref Range Status   C difficile by pcr  NEGATIVE Final    TEST  PERFORMED ONCE PER 72 HOURS/CREDITED PENDING REORDER 12/15/14     Medications:   . sodium chloride   Intravenous Once  . antiseptic oral rinse  7 mL Mouth Rinse q12n4p  . dextrose  25 mL Intravenous Once  . feeding supplement (ENSURE ENLIVE)  237 mL Oral BID BM  . feeding supplement (PRO-STAT SUGAR FREE 64)  30 mL Oral TID  . fluconazole (DIFLUCAN) IV  400 mg Intravenous Q24H  . heparin subcutaneous  5,000 Units Subcutaneous 3 times per day  . lip balm  1 application Topical BID  . methocarbamol  500 mg Oral TID  . metoprolol tartrate  12.5 mg Oral BID  . mirtazapine  15 mg Oral QHS  . morphine  15 mg Oral Q12H  . piperacillin-tazobactam (ZOSYN)  IV  3.375 g Intravenous Q8H  . prednisoLONE acetate  1 drop Both Eyes BID  . saccharomyces boulardii  250 mg Oral BID   Continuous Infusions: . dextrose 5 % and 0.9% NaCl 10 mL/hr at 12/16/14 1839    Time spent: 25 minutes.    LOS: 13 days   [facility] Pager [phone]. If unable to reach me by pager, please call my cell phone at [phone].  *Please refer to [email or web address], password [email or web address] to get updated schedule on who will round on this patient, as hospitalists  switch teams weekly. If 7PM-7AM, please contact night-coverage at [email or web address], password [email or web address] for any overnight needs.  12/17/2014, 8:02 AM

## 2014-12-17 NOTE — Progress Notes (Signed)
13 Days Post-Op  Subjective: Feels great, tolerating diet  Objective: Vital signs in last 24 hours: Temp:  [98.3 F (36.8 C)-98.8 F (37.1 C)] 98.3 F (36.8 C) (05/08 0521) Pulse Rate:  [107-113] 112 (05/08 0521) Resp:  [18] 18 (05/08 0521) BP: (132-145)/(99-104) 139/104 mmHg (05/08 1040) SpO2:  [97 %-99 %] 98 % (05/08 0521) Last BM Date: 12/17/14  Intake/Output from previous day: 05/07 0701 - 05/08 0700 In: 3845.2 [P.O.:708; I.V.:2687.2; IV Piggyback:450] Out: 7124 [Urine:3345; Stool:2050] Intake/Output this shift: Total I/O In: 480 [P.O.:480] Out: 200 [Urine:200]  General appearance: no distress Resp: clear to auscultation bilaterally Cardio: regular rate and rhythm GI: soft nontender, would looks clean today ostomy pink and functional  Lab Results:   Recent Labs  12/16/14 0850 12/17/14 0543  WBC 22.8* 19.2*  HGB 7.6* 7.4*  HCT 24.4* 23.5*  PLT 505* 551*   BMET  Recent Labs  12/16/14 0515 12/17/14 0543  NA 138 139  K 4.9 4.0  CL 110 109  CO2 19* 20*  GLUCOSE 77 86  BUN 20 20  CREATININE 1.69* 1.63*  CALCIUM 6.9* 7.3*   PT/INR No results for input(s): LABPROT, INR in the last 72 hours. ABG No results for input(s): PHART, HCO3 in the last 72 hours.  Invalid input(s): PCO2, PO2  Studies/Results: No results found.  Anti-infectives: Anti-infectives    Start     Dose/Rate Route Frequency Ordered Stop   12/12/14 1400  metroNIDAZOLE (FLAGYL) tablet 500 mg  Status:  Discontinued     500 mg Oral 3 times per day 12/12/14 1011 12/14/14 1404   12/12/14 1300  fluconazole (DIFLUCAN) IVPB 400 mg     400 mg 100 mL/hr over 120 Minutes Intravenous Every 24 hours 12/12/14 1015     12/07/14 1300  fluconazole (DIFLUCAN) IVPB 200 mg  Status:  Discontinued     200 mg 100 mL/hr over 60 Minutes Intravenous Every 24 hours 12/07/14 1102 12/12/14 1015   12/06/14 1300  fluconazole (DIFLUCAN) IVPB 400 mg  Status:  Discontinued     400 mg 100 mL/hr over 120 Minutes  Intravenous Every 24 hours 12/06/14 1119 12/07/14 1102   12/06/14 0000  micafungin (MYCAMINE) 100 mg in sodium chloride 0.9 % 100 mL IVPB  Status:  Discontinued     100 mg 100 mL/hr over 1 Hours Intravenous Daily 12/05/14 1108 12/05/14 1332   12/05/14 1500  micafungin (MYCAMINE) 100 mg in sodium chloride 0.9 % 100 mL IVPB  Status:  Discontinued     100 mg 100 mL/hr over 1 Hours Intravenous Daily 12/05/14 1332 12/06/14 1045   12/05/14 1200  metroNIDAZOLE (FLAGYL) IVPB 500 mg  Status:  Discontinued     500 mg 100 mL/hr over 60 Minutes Intravenous Every 8 hours 12/05/14 1108 12/08/14 0805   12/05/14 1000  micafungin (MYCAMINE) 100 mg in sodium chloride 0.9 % 100 mL IVPB  Status:  Discontinued     100 mg 100 mL/hr over 1 Hours Intravenous Daily 12/04/14 2255 12/04/14 2343   12/05/14 0000  fluconazole (DIFLUCAN) IVPB 200 mg  Status:  Discontinued     200 mg 100 mL/hr over 60 Minutes Intravenous Every 24 hours 12/04/14 2259 12/05/14 1107   12/05/14 0000  vancomycin (VANCOCIN) IVPB 1000 mg/200 mL premix  Status:  Discontinued     1,000 mg 200 mL/hr over 60 Minutes Intravenous Every 8 hours 12/04/14 2356 12/06/14 0850   12/04/14 1515  piperacillin-tazobactam (ZOSYN) IVPB 3.375 g     3.375 g  12.5 mL/hr over 240 Minutes Intravenous Every 8 hours 12/04/14 1510     12/04/14 1415  vancomycin (VANCOCIN) 50 mg/mL oral solution 125 mg  Status:  Discontinued     125 mg Oral 4 times per day 12/04/14 1402 12/04/14 2346   12/04/14 1415  ciprofloxacin (CIPRO) IVPB 400 mg  Status:  Discontinued     400 mg 200 mL/hr over 60 Minutes Intravenous Every 12 hours 12/04/14 1402 12/04/14 1504      Assessment/Plan: POD 12, s/p subtotal colectomy with ileostomy---Dr. Donne Hazel -continue soft diet -continue to mobilize -cont dressing changes -needs to be oob ID-per ID, Zosyn D#12/14, Fluconazole D#11.  DVT prophylaxis -heparin/SCDs  [facility] 12/17/2014

## 2014-12-18 DIAGNOSIS — R1010 Upper abdominal pain, unspecified: Secondary | ICD-10-CM | POA: Insufficient documentation

## 2014-12-18 LAB — GLUCOSE, CAPILLARY
GLUCOSE-CAPILLARY: 92 mg/dL (ref 70–99)
GLUCOSE-CAPILLARY: 96 mg/dL (ref 70–99)
GLUCOSE-CAPILLARY: 85 mg/dL (ref 70–99)
Glucose-Capillary: 66 mg/dL — ABNORMAL LOW (ref 70–99)
Glucose-Capillary: 96 mg/dL (ref 70–99)

## 2014-12-18 LAB — CBC WITH DIFFERENTIAL/PLATELET
BASOS ABS: 0 10*3/uL (ref 0.0–0.1)
BASOS PCT: 0 % (ref 0–1)
EOS ABS: 0.6 10*3/uL (ref 0.0–0.7)
Eosinophils Relative: 4 % (ref 0–5)
HCT: 21 % — ABNORMAL LOW (ref 39.0–52.0)
Hemoglobin: 6.6 g/dL — CL (ref 13.0–17.0)
Lymphocytes Relative: 14 % (ref 12–46)
Lymphs Abs: 1.9 10*3/uL (ref 0.7–4.0)
MCH: 27.8 pg (ref 26.0–34.0)
MCHC: 31.4 g/dL (ref 30.0–36.0)
MCV: 88.6 fL (ref 78.0–100.0)
MONOS PCT: 5 % (ref 3–12)
Monocytes Absolute: 0.7 10*3/uL (ref 0.1–1.0)
NEUTROS ABS: 10.7 10*3/uL — AB (ref 1.7–7.7)
NEUTROS PCT: 77 % (ref 43–77)
Platelets: 571 10*3/uL — ABNORMAL HIGH (ref 150–400)
RBC: 2.37 MIL/uL — ABNORMAL LOW (ref 4.22–5.81)
RDW: 19.2 % — AB (ref 11.5–15.5)
WBC: 14 10*3/uL — AB (ref 4.0–10.5)

## 2014-12-18 LAB — OCCULT BLOOD X 1 CARD TO LAB, STOOL: Fecal Occult Bld: NEGATIVE

## 2014-12-18 LAB — MAGNESIUM: MAGNESIUM: 1.8 mg/dL (ref 1.7–2.4)

## 2014-12-18 LAB — PREPARE RBC (CROSSMATCH)

## 2014-12-18 LAB — PHOSPHORUS: Phosphorus: 3.4 mg/dL (ref 2.5–4.6)

## 2014-12-18 MED ORDER — SODIUM CHLORIDE 0.9 % IV SOLN
Freq: Once | INTRAVENOUS | Status: AC
  Administered 2014-12-18: 11:00:00 via INTRAVENOUS

## 2014-12-18 NOTE — Progress Notes (Signed)
Patient ID: Vincent Black, male   DOB: 1970/09/05, 44 y.o.   MRN: [id number]     [facility]      [address] [place], [place] [address]    Phone: [phone] FAX: [phone]     Subjective: Intermittent nausea.  158m ostomy output.  Appetite is much better.  WBC is down. Afebrile.   Objective:  Vital signs:  Filed Vitals:   12/17/14 2110 12/18/14 0524 12/18/14 0852 12/18/14 0915  BP: 135/97 138/105 141/105 148/104  Pulse: 109 101 115 109  Temp: 98.5 F (36.9 C) 97.7 F (36.5 C) 99.3 F (37.4 C) 99.2 F (37.3 C)  TempSrc: Oral Oral Oral Oral  Resp: 16 15 16 16   Height:      Weight:      SpO2: 96% 98% 96%     Last BM Date: 12/18/14  Intake/Output   Yesterday:  05/08 0701 - 05/09 0700 In: 840 [P.O.:840] Out: 5150 [Urine:3575; SDTOIZ:1245]This shift:  Total I/O In: 870 [I.V.:520; IV Piggyback:350] Out: -    Physical Exam: General: Pt awake/alert/oriented x4 in no acute distress Abdomen: Soft.  Nondistended.   Mildly tender at incisions only.  Wound is c/d/i. Ileostomy functionating.   No evidence of peritonitis.  No incarcerated hernias.    Problem List:   Principal Problem:   Abdominal pain Active Problems:   Crohn's disease   Protein-calorie malnutrition, severe   Unintentional weight loss   Normocytic anemia   Thrombocytosis   Pericolonic abscess   Enteritis due to Clostridium difficile   Fever   Perforation of sigmoid colon   C. difficile colitis   Septic shock   Toxic megacolon   Small bowel fistula   Sinus tachycardia   Hypomagnesemia   Metabolic acidosis   Hyperglycemia   Leukocytosis   Chronic pain syndrome   Anxiety state   Perforated sigmoid colon   Anemia, chronic disease   Acute renal failure   Anasarca   Pain of upper abdomen    Results:   Labs: Results for orders placed or performed during the hospital encounter of 12/04/14 (from the past 48 hour(s))  Glucose,  capillary     Status: None   Collection Time: 12/16/14 12:10 PM  Result Value Ref Range   Glucose-Capillary 92 70 - 99 mg/dL  Glucose, capillary     Status: None   Collection Time: 12/16/14  4:42 PM  Result Value Ref Range   Glucose-Capillary 89 70 - 99 mg/dL  Glucose, capillary     Status: None   Collection Time: 12/16/14  9:21 PM  Result Value Ref Range   Glucose-Capillary 88 70 - 99 mg/dL   Comment 1 Notify RN    Comment 2 Document in Chart   CBC with Differential/Platelet     Status: Abnormal   Collection Time: 12/17/14  5:43 AM  Result Value Ref Range   WBC 19.2 (H) 4.0 - 10.5 K/uL   RBC 2.63 (L) 4.22 - 5.81 MIL/uL   Hemoglobin 7.4 (L) 13.0 - 17.0 g/dL   HCT 23.5 (L) 39.0 - 52.0 %   MCV 89.4 78.0 - 100.0 fL   MCH 28.1 26.0 - 34.0 pg   MCHC 31.5 30.0 - 36.0 g/dL   RDW 19.8 (H) 11.5 - 15.5 %   Platelets 551 (H) 150 - 400 K/uL   Neutrophils Relative % 81 (H) 43 - 77 %   Neutro Abs 15.7 (H) 1.7 - 7.7 K/uL  Lymphocytes Relative 10 (L) 12 - 46 %   Lymphs Abs 1.9 0.7 - 4.0 K/uL   Monocytes Relative 5 3 - 12 %   Monocytes Absolute 0.9 0.1 - 1.0 K/uL   Eosinophils Relative 4 0 - 5 %   Eosinophils Absolute 0.7 0.0 - 0.7 K/uL   Basophils Relative 0 0 - 1 %   Basophils Absolute 0.1 0.0 - 0.1 K/uL  Comprehensive metabolic panel     Status: Abnormal   Collection Time: 12/17/14  5:43 AM  Result Value Ref Range   Sodium 139 135 - 145 mmol/L   Potassium 4.0 3.5 - 5.1 mmol/L    Comment: DELTA CHECK NOTED   Chloride 109 101 - 111 mmol/L   CO2 20 (L) 22 - 32 mmol/L   Glucose, Bld 86 70 - 99 mg/dL   BUN 20 6 - 20 mg/dL   Creatinine, Ser 1.63 (H) 0.61 - 1.24 mg/dL   Calcium 7.3 (L) 8.9 - 10.3 mg/dL   Total Protein 5.0 (L) 6.5 - 8.1 g/dL   Albumin <1.0 (L) 3.5 - 5.0 g/dL   AST 16 15 - 41 U/L   ALT 9 (L) 17 - 63 U/L   Alkaline Phosphatase 449 (H) 38 - 126 U/L   Total Bilirubin 0.4 0.3 - 1.2 mg/dL   GFR calc non Af Amer 50 (L) >60 mL/min   GFR calc Af Amer 59 (L) >60 mL/min     Comment: (NOTE) The eGFR has been calculated using the CKD EPI equation. This calculation has not been validated in all clinical situations. eGFR's persistently <60 mL/min signify possible Chronic Kidney Disease.    Anion gap 10 5 - 15  Magnesium     Status: Abnormal   Collection Time: 12/17/14  5:43 AM  Result Value Ref Range   Magnesium 1.6 (L) 1.7 - 2.4 mg/dL  Phosphorus     Status: None   Collection Time: 12/17/14  5:43 AM  Result Value Ref Range   Phosphorus 3.4 2.5 - 4.6 mg/dL  Glucose, capillary     Status: None   Collection Time: 12/17/14  8:03 AM  Result Value Ref Range   Glucose-Capillary 88 70 - 99 mg/dL  Glucose, capillary     Status: None   Collection Time: 12/17/14 12:26 PM  Result Value Ref Range   Glucose-Capillary 90 70 - 99 mg/dL  Glucose, capillary     Status: Abnormal   Collection Time: 12/17/14  5:03 PM  Result Value Ref Range   Glucose-Capillary 110 (H) 70 - 99 mg/dL  Glucose, capillary     Status: None   Collection Time: 12/17/14  9:09 PM  Result Value Ref Range   Glucose-Capillary 81 70 - 99 mg/dL   Comment 1 Notify RN   CBC with Differential/Platelet     Status: Abnormal   Collection Time: 12/18/14  5:08 AM  Result Value Ref Range   WBC 14.0 (H) 4.0 - 10.5 K/uL   RBC 2.37 (L) 4.22 - 5.81 MIL/uL   Hemoglobin 6.6 (LL) 13.0 - 17.0 g/dL    Comment: REPEATED TO VERIFY CRITICAL RESULT CALLED TO, READ BACK BY AND VERIFIED WITH: BUTLERT RN 0701 12/18/14 TEETERN    HCT 21.0 (L) 39.0 - 52.0 %   MCV 88.6 78.0 - 100.0 fL   MCH 27.8 26.0 - 34.0 pg   MCHC 31.4 30.0 - 36.0 g/dL   RDW 19.2 (H) 11.5 - 15.5 %   Platelets 571 (H) 150 - 400 K/uL  Neutrophils Relative % 77 43 - 77 %   Neutro Abs 10.7 (H) 1.7 - 7.7 K/uL   Lymphocytes Relative 14 12 - 46 %   Lymphs Abs 1.9 0.7 - 4.0 K/uL   Monocytes Relative 5 3 - 12 %   Monocytes Absolute 0.7 0.1 - 1.0 K/uL   Eosinophils Relative 4 0 - 5 %   Eosinophils Absolute 0.6 0.0 - 0.7 K/uL   Basophils Relative 0 0 - 1  %   Basophils Absolute 0.0 0.0 - 0.1 K/uL  Magnesium     Status: None   Collection Time: 12/18/14  5:08 AM  Result Value Ref Range   Magnesium 1.8 1.7 - 2.4 mg/dL  Phosphorus     Status: None   Collection Time: 12/18/14  5:08 AM  Result Value Ref Range   Phosphorus 3.4 2.5 - 4.6 mg/dL  Prepare RBC     Status: None   Collection Time: 12/18/14  7:12 AM  Result Value Ref Range   Order Confirmation ORDER PROCESSED BY BLOOD BANK   Glucose, capillary     Status: None   Collection Time: 12/18/14  8:11 AM  Result Value Ref Range   Glucose-Capillary 92 70 - 99 mg/dL    Imaging / Studies: No results found.  Medications / Allergies:  Scheduled Meds: . antiseptic oral rinse  7 mL Mouth Rinse q12n4p  . dextrose  25 mL Intravenous Once  . feeding supplement (ENSURE ENLIVE)  237 mL Oral BID BM  . feeding supplement (PRO-STAT SUGAR FREE 64)  30 mL Oral TID  . fluconazole (DIFLUCAN) IV  400 mg Intravenous Q24H  . heparin subcutaneous  5,000 Units Subcutaneous 3 times per day  . lip balm  1 application Topical BID  . methocarbamol  500 mg Oral TID  . metoprolol tartrate  12.5 mg Oral BID  . mirtazapine  15 mg Oral QHS  . morphine  15 mg Oral Q12H  . piperacillin-tazobactam (ZOSYN)  IV  3.375 g Intravenous Q8H  . prednisoLONE acetate  1 drop Both Eyes BID  . saccharomyces boulardii  250 mg Oral BID   Continuous Infusions: . dextrose 5 % and 0.9% NaCl 10 mL/hr at 12/16/14 1839   PRN Meds:.alum & mag hydroxide-simeth, butamben-tetracaine-benzocaine, diphenhydrAMINE, LORazepam, magic mouthwash, ondansetron **OR** ondansetron (ZOFRAN) IV, oxyCODONE, oxyCODONE, phenol, promethazine, sodium chloride  Antibiotics: Anti-infectives    Start     Dose/Rate Route Frequency Ordered Stop   12/12/14 1400  metroNIDAZOLE (FLAGYL) tablet 500 mg  Status:  Discontinued     500 mg Oral 3 times per day 12/12/14 1011 12/14/14 1404   12/12/14 1300  fluconazole (DIFLUCAN) IVPB 400 mg     400 mg 100 mL/hr  over 120 Minutes Intravenous Every 24 hours 12/12/14 1015     12/07/14 1300  fluconazole (DIFLUCAN) IVPB 200 mg  Status:  Discontinued     200 mg 100 mL/hr over 60 Minutes Intravenous Every 24 hours 12/07/14 1102 12/12/14 1015   12/06/14 1300  fluconazole (DIFLUCAN) IVPB 400 mg  Status:  Discontinued     400 mg 100 mL/hr over 120 Minutes Intravenous Every 24 hours 12/06/14 1119 12/07/14 1102   12/06/14 0000  micafungin (MYCAMINE) 100 mg in sodium chloride 0.9 % 100 mL IVPB  Status:  Discontinued     100 mg 100 mL/hr over 1 Hours Intravenous Daily 12/05/14 1108 12/05/14 1332   12/05/14 1500  micafungin (MYCAMINE) 100 mg in sodium chloride 0.9 % 100 mL IVPB  Status:  Discontinued     100 mg 100 mL/hr over 1 Hours Intravenous Daily 12/05/14 1332 12/06/14 1045   12/05/14 1200  metroNIDAZOLE (FLAGYL) IVPB 500 mg  Status:  Discontinued     500 mg 100 mL/hr over 60 Minutes Intravenous Every 8 hours 12/05/14 1108 12/08/14 0805   12/05/14 1000  micafungin (MYCAMINE) 100 mg in sodium chloride 0.9 % 100 mL IVPB  Status:  Discontinued     100 mg 100 mL/hr over 1 Hours Intravenous Daily 12/04/14 2255 12/04/14 2343   12/05/14 0000  fluconazole (DIFLUCAN) IVPB 200 mg  Status:  Discontinued     200 mg 100 mL/hr over 60 Minutes Intravenous Every 24 hours 12/04/14 2259 12/05/14 1107   12/05/14 0000  vancomycin (VANCOCIN) IVPB 1000 mg/200 mL premix  Status:  Discontinued     1,000 mg 200 mL/hr over 60 Minutes Intravenous Every 8 hours 12/04/14 2356 12/06/14 0850   12/04/14 1515  piperacillin-tazobactam (ZOSYN) IVPB 3.375 g     3.375 g 12.5 mL/hr over 240 Minutes Intravenous Every 8 hours 12/04/14 1510     12/04/14 1415  vancomycin (VANCOCIN) 50 mg/mL oral solution 125 mg  Status:  Discontinued     125 mg Oral 4 times per day 12/04/14 1402 12/04/14 2346   12/04/14 1415  ciprofloxacin (CIPRO) IVPB 400 mg  Status:  Discontinued     400 mg 200 mL/hr over 60 Minutes Intravenous Every 12 hours 12/04/14 1402  12/04/14 1504        Assessment/Plan: POD 14, s/p subtotal colectomy with ileostomy---Dr. Donne Hazel -continue soft diet -continue to mobilize -cont dressing changes -needs to be oob -Imodium if ok with primary team ID-per ID, Zosyn D#14/14, Fluconazole D#13.  DVT prophylaxis -heparin/SCDs Dispo-when medically stable, getting blood tx for hgb 6.6  Erby Pian, [facility] Pager [phone](7A-4:30P) For consults and floor pages call [phone](7A-4:30P)  12/18/2014 11:34 AM

## 2014-12-18 NOTE — Progress Notes (Signed)
Utilization Review completed. Trystan Eads RN BSN CM 

## 2014-12-18 NOTE — Progress Notes (Signed)
Physical Therapy Treatment Patient Details Name: Vincent Black MRN: [id number] DOB: [birth year] Today's Date: 12/18/2014    History of Present Illness Pt is 44 yo male with known history of Crohn's disease, several recent admissions in March 2016, discharged on March 1st, 2016 after being treated severe C. difficile colitis complicated by pericolonic abscess requiring drain placement on 10/27/14 and positive for Citrobacter and Klebsiella, most recent discharge on 3/30 after being treated for severe sepsis secondary to intra-abd abscess. Pt has seen Dr. Megan Salon for management of ABX on 11/21/2014, was advised to continue taking antibiotics ciprofloxacin and oral vancomycin until repeat CT abdomen done which was scheduled be done on 12/05/2014. Last CT abdomen was done on 11/17/2014 and showed abscess cavity collapsed. Patient saw Dr. Benson Norway his gastroenterologist earlier in a day for evaluation of sudden onset of hematemesis, generalized weakness, right upper quadrant area pain. Patient explains this started very suddenly, started to throw up massive amounts of gastric content with blood in it. 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    PT Comments    Pt progressing with mobility, able to ambulate 3' with RW bed to chair. Pt would benefit from RW at home as well as w/c for community mobility. PT will continue to follow.   Follow Up Recommendations  Home health PT;Supervision/Assistance - 24 hour     Equipment Recommendations  Wheelchair (measurements PT);3in1 (PT);Rolling walker with 5" wheels    Recommendations for Other Services       Precautions / Restrictions Precautions Precautions: Fall Precaution Comments: JP drain Restrictions Weight Bearing Restrictions: No    Mobility  Bed Mobility Overal bed mobility: Needs Assistance Bed Mobility: Supine to Sit     Supine to sit:  Supervision;HOB elevated     General bed mobility comments: allowed pt to elevate HOB for pain control, pt was able to pivot to EOB with supervision and use of rail.   Transfers Overall transfer level: Needs assistance Equipment used: Rolling walker (2 wheeled) Transfers: Sit to/from Stand Sit to Stand: Min assist;+2 safety/equipment         General transfer comment: vc's for hand placement on bed for safety and pt verbalized understanding but allowed pt to place hands on RW with RW supported for pain control.   Ambulation/Gait Ambulation/Gait assistance: +2 safety/equipment;Min assist Ambulation Distance (Feet): 3 Feet Assistive device: Rolling walker (2 wheeled) Gait Pattern/deviations: Step-to pattern;Decreased stride length;Trunk flexed Gait velocity: very slow Gait velocity interpretation: <1.8 ft/sec, indicative of risk for recurrent falls General Gait Details: pt ambulated with RW which greatly helped with pain control    Stairs            Wheelchair Mobility    Modified Rankin (Stroke Patients Only)       Balance Overall balance assessment: Needs assistance Sitting-balance support: No upper extremity supported;Feet supported Sitting balance-Leahy Scale: Fair Sitting balance - Comments: pt able to maintain sitting EOB without support, discussed the possibilty of sitting EOB for meals if not in chair   Standing balance support: During functional activity;Bilateral upper extremity supported Standing balance-Leahy Scale: Poor Standing balance comment: min A needed due to abdominal pain                    Cognition Arousal/Alertness: Awake/alert Behavior During Therapy: WFL for tasks assessed/performed;Flat affect Overall Cognitive Status: Within Functional Limits for tasks assessed  Exercises General Exercises - Lower Extremity Ankle Circles/Pumps: AROM;Both;10 reps;Seated Long Arc Quad: AROM;Both;5 reps;Seated (with 3  sec hold) Hip Flexion/Marching: AROM;Both;5 reps;Seated    General Comments General comments (skin integrity, edema, etc.): swelling bilateral LE's. Discussed performing sit to stand frequently for erect posture and gravitational pull for bowel/ bladder function. Instructed to perform several times each hour since pt unable to ambulate for any significant length of tim due to pain      Pertinent Vitals/Pain Pain Assessment: 0-10 Pain Score: 6  Pain Location: abdomen Pain Intervention(s): Limited activity within patient's tolerance;Monitored during session;Premedicated before session  VSS    Home Living                      Prior Function            PT Goals (current goals can now be found in the care plan section) Acute Rehab PT Goals Patient Stated Goal: Get better PT Goal Formulation: With patient/family Time For Goal Achievement: 12/28/14 Potential to Achieve Goals: Good Progress towards PT goals: Progressing toward goals    Frequency  Min 3X/week    PT Plan Discharge plan needs to be updated    Co-evaluation             End of Session   Activity Tolerance: No increased pain Patient left: in chair;with call bell/phone within reach;with family/visitor present     Time: 0454-0981 PT Time Calculation (min) (ACUTE ONLY): 24 min  Charges:  $Therapeutic Activity: 23-37 mins                    G Codes:     Leighton Roach, PT  Acute Rehab Services  [phone]  Leighton Roach 12/18/2014, 12:29 PM

## 2014-12-18 NOTE — Progress Notes (Signed)
NUTRITION FOLLOW UP  Intervention:    Continue Ensure Enlive po BID, each supplement provides 350 kcal and 20 grams of protein  Continue Prostat liquid protein po 30 ml BID with meals, each supplement provides 100 kcal, 15 grams protein  RD to follow for nutrition care plan  Nutrition Dx:   Malnutrition related to acute illness as evidenced by weight loss and severe muscle mass loss, ongoing  Goal:   Pt to meet >/= 90% of their estimated nutrition needs, progressing   Monitor:   PO & supplemental intake, weight, labs, I/O's  Assessment:   Pt with history of Crohn's disease, several recent admissions in March 2016 after being treated severe C. difficile colitis complicated by pericolonic abscess requiring drain placement. CT abdomen was done on 11/17/2014 and showed abscess cavity collapsed. Pt presented with hematemesis, generalized weakness, right upper quadrant area pain.   Procedure (4/25):  1. Ex lap 2. Small bowel resection with anastomosis 3. Total abdominal colectomy with end ileostomy  Pt continues on a a soft diet; appetite continues to improve (PO: 50-75%).  He continues to accept Ensure and Prostat supplements; RD to continue to maximize nutritional status.   JP drain removed and wound vac d/c on 12/15/14. 75 ml output from RLQ ileostomy.   Plan is to d/c home with home health once medically stable. Pt does not qualify for CIR. CSW following.  Labs reviewed. Cl: 20, Creat: 1.63, Calcium: 7.3. CBGS WDL.  Height: Ht Readings from Last 1 Encounters:  12/12/14 5\' 9"  (1.753 m)    Weight Status:   Wt Readings from Last 1 Encounters:  12/12/14 170 lb 6.7 oz (77.3 kg)   12/04/14 147 lb 7.8 oz (66.9 kg)       Re-estimated needs:  Kcal: 1900-2100 Protein: 95-105 grams Fluid: 1.9-2.1 L  Skin: closed mid abdominal incision  Diet Order: DIET SOFT Room service appropriate?: Yes; Fluid consistency:: Thin   Intake/Output Summary (Last 24 hours) at 12/18/14  1425 Last data filed at 12/18/14 1145  Gross per 24 hour  Intake   1560 ml  Output   2800 ml  Net  -1240 ml    Last BM: 12/13/14   Labs:   Recent Labs Lab 12/15/14 0305 12/16/14 0515 12/17/14 0543 12/18/14 0508  NA 143 138 139  --   K 4.8 4.9 4.0  --   CL 115* 110 109  --   CO2 20* 19* 20*  --   BUN 17 20 20   --   CREATININE 1.63* 1.69* 1.63*  --   CALCIUM 7.2* 6.9* 7.3*  --   MG 1.4* 1.3* 1.6* 1.8  PHOS 3.4 3.4 3.4 3.4  GLUCOSE 80 77 86  --     CBG (last 3)   Recent Labs  12/17/14 2109 12/18/14 0811 12/18/14 1240  GLUCAP 81 92 96    Scheduled Meds: . antiseptic oral rinse  7 mL Mouth Rinse q12n4p  . dextrose  25 mL Intravenous Once  . feeding supplement (ENSURE ENLIVE)  237 mL Oral BID BM  . feeding supplement (PRO-STAT SUGAR FREE 64)  30 mL Oral TID  . fluconazole (DIFLUCAN) IV  400 mg Intravenous Q24H  . heparin subcutaneous  5,000 Units Subcutaneous 3 times per day  . lip balm  1 application Topical BID  . methocarbamol  500 mg Oral TID  . metoprolol tartrate  12.5 mg Oral BID  . mirtazapine  15 mg Oral QHS  . morphine  15 mg Oral Q12H  .  piperacillin-tazobactam (ZOSYN)  IV  3.375 g Intravenous Q8H  . prednisoLONE acetate  1 drop Both Eyes BID  . saccharomyces boulardii  250 mg Oral BID    Continuous Infusions: . dextrose 5 % and 0.9% NaCl 1,000 mL (12/18/14 1149)    Vincent Black A. Jimmye Norman, RD, LDN, CDE Pager: [phone] After hours Pager: [phone]

## 2014-12-18 NOTE — Progress Notes (Signed)
Hypoglycemic Event  CBG: 66  Treatment: 15 GM carbohydrate snack  Symptoms: Hungry  Follow-up CBG: Time:2223 CBG Result:96  Possible Reasons for Event: Inadequate meal intake  Comments/MD notified:     Analyse Angst L  Remember to initiate Hypoglycemia Order Set & complete

## 2014-12-18 NOTE — Clinical Social Work Note (Signed)
Clinical Social Worker continuing to follow patient and family for support and discharge planning needs. Patient family plans to provide 24 hour support at home. CM to arrange home health and wound vac prior to discharge. CSW to arrange ambulance transport at discharge - confirmed address is correct in EPIC. Please notify CSW at time of discharge to arrange transport.  Vincent Black, [place]

## 2014-12-18 NOTE — Progress Notes (Signed)
Progress Note   Vincent Black XTK:[id number] DOB: 1970-08-18 DOA: 12/04/2014 PCP: No PCP Per Patient   Brief Narrative:   Vincent Black is an 44 y.o. male with a PMH of anxiety, ankylosing spondylitis, Crohn's disease and Clostridium difficile colitis who was admitted 12/04/14 the chief complaint of worsening abdominal pain, found to have a perforated sigmoid colon and perforated cecal toxic megacolon as well as small bowel fistula. He underwent exploratory laparotomy/small bowel resection with anastomosis and total abdominal colectomy with end ileostomy on 12/04/14.  Assessment/Plan:   Principal Problem: Septic shock secondary to Perforated sigmoid colon, perforated cecal toxic megacolon, and small bowel fistula in the setting of Crohn's disease  - s/p ex lap and SBR with anastomosis, total abdominal colectomy with end ileostomy (4/25 - Dr. Donne Hazel). - Continue antibiotics per ID recommendations: 2 weeks of therapy with Zosyn/fluconazole. - Due to problems with nausea, Flagyl discontinued early per surgical recommendations. Repeat C. difficile PCR negative. - Wound cultures positive for Candida albicans, continue Diflucan. Blood cultures negative. - GI, surgery and ID following.  Active problems:  Anasarca - Albumin less than 1. Increased pro-stat to 3 times a day 12/16/14.  Off IVF.  I/O balance -4.3 L.  Hypomagnesemia - Resolved with supplementation.  Hyperkalemia - Potassium supplementation in IV fluids discontinued and potassium WNL after receiving Kayexalate 30 g 1 on 12/14/14.  Severe protein calorie malnutrition / unintentional weight loss - The patient has lost over 50 pounds in the past 6 months. - Continue nutritional supplements. - Continue Remeron for appetite stimulation.  C.diff colitis  - Repeat C diff negative.  - Completed a course of Flagyl and is status post colectomy.  Sinus tachycardia - Improved with treatment of sepsis.  Acute renal  failure/Decreased uop - Prerenal in etiology with significant third spacing of fluid. - Creatinine stable around 1.6 over the past several days.  Normocytic Anemia - chronic - Hemoglobin 6.3 mg/dL 12/15/14, received 1 unit of packed red blood cells. - Hemoglobin 6.6 today, give 1 unit of PRBCs.  Thrombocytosis  - Persistent, continue to monitor.  Leukocytosis - Secondary to sepsis. Slowly trending down. Repeat C. difficile PCR negative.  Acute on chronic pain uncontrolled  - Continue Robaxin, MS Contin and OxyIR for breakthrough pain.  Anxiety -PRN Ativan.  DVT Prophylaxis - Continue subcutaneous heparin.  Code Status: Full. Family Communication: Wilhemena Durie, wife, updated at bedside.   Disposition Plan: SNF for rehab versus home with PT when medically stable and course of IV antibiotics with Zosyn completed, likely 1-2 days if hemoglobin stable.   IV Access:    CVC right internal jugular placed 12/04/14   Procedures and diagnostic studies:   Dg Chest 1 View  12/04/2014   CLINICAL DATA:  Fever. Nasogastric tube placement. Crohn disease. Anxiety.  EXAM: CHEST  1 VIEW  COMPARISON:  CT of 11/17/2014  FINDINGS: Nasogastric terminates at the body of the stomach. Suspect remote anterior right rib trauma including at the second and possibly the third anterior right ribs. Midline trachea. Normal heart size. No pleural fluid. Low lung volumes. Patchy bibasilar atelectasis. Free intraperitoneal air under both hemidiaphragms.  IMPRESSION: 1. Appropriate position of nasogastric tube. 2. Extensive free intraperitoneal air. Critical test results telephoned toMaggie, r.n. at the time of interpretation at 4:40 p.m.on . 12/04/2014.   Electronically Signed   By: Abigail Miyamoto M.D.   On: 12/04/2014 16:38   Ir Sinus/fist Tube Chk-non Gi  11/28/2014   CLINICAL DATA:  History  of Crohn's disease and colonic perforation with development of an intraperitoneal abscess.  Patient underwent CT-guided percutaneous  drainage catheter placement on 10/27/2014 with subsequent CT scan performed 11/17/2014 demonstrating near complete resolution of the abdominal abscess. Contrast injection of the percutaneous drain also performed 11/17/2014 demonstrated a persistent fistulous connection with the adjacent bowel.  Patient returns to the Interventional Radiology Clinic for repeat fluoroscopic guided percutaneous drainage catheter injection. The patient reports minimal to no output from the percutaneous drainage catheter. The patient has not been flushing the percutaneous drain.  EXAM: SINUS TRACT INJECTION/FISTULOGRAM  COMPARISON:  CT abdomen and pelvis- 11/17/2014; 11/03/2014; 10/26/2014; CT-guided percutaneous drainage catheter placement -10/27/2014 ; fluoroscopic guided percutaneous drainage catheter injection- 11/17/2014  CONTRAST:  10 mL OMNIPAQUE IOHEXOL 300 MG/ML SOLN - injected via the existing left lower quadrant percutaneous drainage catheter.  FLUOROSCOPY TIME:  1 minute, 18 seconds (33.8 mGy).  TECHNIQUE: Patient was placed supine on the fluoroscopy table.  A preprocedural spot fluoroscopic image was obtained of the left lower abdominal quadrant and existing percutaneous drainage catheter.  Multiple spot fluoroscopic and radiographic images were obtained in various obliquities following the injection of a small amount of contrast via the percutaneous drainage catheter.  Images were reviewed and the procedure was terminated. The drainage catheter was flushed with a small amount of saline and reconnected to a gravity bag. A dressing was placed. The patient tolerated the procedure well without immediate postprocedural complication.  FINDINGS: Preprocedural spot fluoroscopic image demonstrates unchanged positioning of the percutaneous drainage catheter overlying left lower abdominal quadrant.  Contrast injection confirms a persistent fistulous connection with an adjacent loop of small bowel.  As such, the drainage catheter was  reconnected to a gravity bag.  IMPRESSION: Persistent fistulous connection from the percutaneous drainage catheter to an adjacent loop of small bowel.  PLAN: Patient will return to the interventional radiology drain Clinic on 12/12/2014 for repeat fluoroscopic guided percutaneous drainage catheter injection. If a fistulous connection persists, surgical revision may have to be considered.   Electronically Signed   By: Sandi Mariscal M.D.   On: 11/28/2014 14:25   Dg Chest Port 1 View  12/07/2014   CLINICAL DATA:  Pulmonary edema.  EXAM: PORTABLE CHEST - 1 VIEW  COMPARISON:  12/06/2014  FINDINGS: Low lung volumes with bibasilar atelectasis and vascular congestion, similar to prior study. Lucency under the right hemidiaphragm again noted, possibly pneumoperitoneum. NG tube and right central line remain in place, unchanged.  IMPRESSION: Low lung volumes with bibasilar atelectasis.  Lucency under the right hemidiaphragm again noted, possibly pneumoperitoneum related to recent surgery. May consider decubitus view of the abdomen to assess degree of pneumoperitoneum if felt clinically indicated.   Electronically Signed   By: Rolm Baptise M.D.   On: 12/07/2014 08:50   Dg Chest Port 1 View  12/06/2014   CLINICAL DATA:  Edema  EXAM: PORTABLE CHEST - 1 VIEW  COMPARISON:  12/04/2014  FINDINGS: Low lung volumes with vascular crowding. Possible mild interstitial edema with bibasilar atelectasis. No pneumothorax.  The heart is normal in size.  Right IJ venous catheter terminates at the cavoatrial junction.  Enteric tube traverses in the distal gastric antrum.  Lucency beneath the right hemidiaphragm could suggest free air related to recent surgery.  IMPRESSION: Low lung volumes with vascular crowding and possible mild interstitial edema.  Bibasilar atelectasis.  Support apparatus as above.   Electronically Signed   By: Julian Hy M.D.   On: 12/06/2014 09:45   Dg Chest [facility]  1 View  12/04/2014   CLINICAL DATA:  Central  line and NG tube placement.  EXAM: PORTABLE CHEST - 1 VIEW  COMPARISON:  Earlier same day  FINDINGS: Grossly unchanged cardiac silhouette and mediastinal contours. Interval placement of a right jugular approach intravenous catheter with tip projected of the superior cavoatrial junction. Enteric tube tip and side port projects over the expected location of the gastric antrum. No pneumothorax. Apparent resolution of previously noted pneumoperitoneum. Improved aeration of lung bases without focal airspace opacity. No pleural effusion or pneumothorax. Unchanged bones.  IMPRESSION: 1. Appropriately positioned support apparatus as above. No pneumothorax. 2. Apparent resolution of previously noted large amount of pneumoperitoneum. 3. Improved aeration lungs with resolved bibasilar atelectasis.   Electronically Signed   By: Sandi Mariscal M.D.   On: 12/04/2014 21:19   Dg Abd Portable 1v  12/07/2014   CLINICAL DATA:  Nasogastric tube placement.  Initial encounter.  EXAM: PORTABLE ABDOMEN - 1 VIEW  COMPARISON:  Abdominal radiograph performed 12/04/2014  FINDINGS: The patient's enteric tube is noted ending overlying the body of the stomach.  The visualized bowel gas pattern is unremarkable. A bowel suture line is noted at the left lower quadrant. An ostomy is seen at the right lower quadrant. Scattered air and stool filled loops of colon are seen; no abnormal dilatation of small bowel loops is seen to suggest small bowel obstruction. No free intra-abdominal air is identified, though evaluation for free air is limited on a single supine view.  The visualized osseous structures are within normal limits; the sacroiliac joints are unremarkable in appearance.  A drainage catheter is noted overlying the upper pelvis.  IMPRESSION: 1. Enteric tube noted ending overlying the body of the stomach. 2. Unremarkable bowel gas pattern; postoperative change at the lower quadrants. No free intra-abdominal air seen.   Electronically Signed    By: Garald Balding M.D.   On: 12/07/2014 06:32   Dg Abd Portable 1v  12/04/2014   CLINICAL DATA:  Nasogastric tube placement.  EXAM: PORTABLE ABDOMEN - 1 VIEW  COMPARISON:  Abdominal CT 11/17/2014  FINDINGS: There is large pneumoperitoneum with Rigler's sign and outlining of the falciform ligament. The colon is distended with high-density material, likely retained contrast from comparison CT. Small bowel dilatation.  Nasogastric tube is in good position. There is a left lower quadrant percutaneous abscess drain.  Critical Value/emergent results were called by telephone at the time of interpretation on 12/04/2014 at 4:50 pm to Dr. [name] , who verbally acknowledged these results.  IMPRESSION: 1. Large pneumoperitoneum consistent with bowel perforation. 2. Small bowel and colonic distention which could be from low colonic obstruction or ileus. 3. Nasogastric tube is in good position. 4. Left lower quadrant percutaneous abscess drain.   Electronically Signed   By: Monte Fantasia M.D.   On: 12/04/2014 16:51     Medical Consultants:    Meredith Staggers, MD, Physical Medicine and Rehabilitation  Carol Ada, MD, Gastroenterology  Simonne Maffucci, MD, PCCM  Rolm Bookbinder, MD, General Surgery   Anti-Infectives:   Total days of antibiotics: 8  C diff therapy: day 7 (po vanco --> flagyl)  Zosyn 4/25 > 12/18/14  Flagyl 4/26 > 12/14/14  Fluconazole 4/27 > 12/20/14  Received mycafungin prior to admission  Subjective:   Vincent Black reports an episode of nausea and vomiting this a.m. Appetite good.  Pain currently 7/10.  No black or frankly bloody stools.    Objective:    Filed Vitals:  12/17/14 1040 12/17/14 1508 12/17/14 2110 12/18/14 0524  BP: 139/104 131/97 135/97 138/105  Pulse:  98 109 101  Temp:  99.3 F (37.4 C) 98.5 F (36.9 C) 97.7 F (36.5 C)  TempSrc:  Oral Oral Oral  Resp:  16 16 15   Height:      Weight:      SpO2:  99% 96% 98%    Intake/Output Summary  (Last 24 hours) at 12/18/14 0737 Last data filed at 12/18/14 0630  Gross per 24 hour  Intake    840 ml  Output   5150 ml  Net  -4310 ml    Exam: Gen:  NAD Cardiovascular:  Mildly tachycardic, No M/R/G Respiratory:  Lungs CTAB Gastrointestinal:  Abdomen soft, +BS, diffusely tender. Extremities:  3+ pitting edema bilaterally   Data Reviewed:    Labs: Basic Metabolic Panel:  Recent Labs Lab 12/13/14 0430 12/14/14 0505 12/15/14 0305 12/16/14 0515 12/17/14 0543 12/18/14 0508  NA 141 142 143 138 139  --   K 4.9 5.7* 4.8 4.9 4.0  --   CL 111 115* 115* 110 109  --   CO2 20* 20* 20* 19* 20*  --   GLUCOSE 83 66* 80 77 86  --   BUN 20 17 17 20 20   --   CREATININE 1.65* 1.65* 1.63* 1.69* 1.63*  --   CALCIUM 7.0* 7.1* 7.2* 6.9* 7.3*  --   MG 1.6* 1.5* 1.4* 1.3* 1.6* 1.8  PHOS 2.7 3.2 3.4 3.4 3.4 3.4   GFR Estimated Creatinine Clearance: 59 mL/min (by C-G formula based on Cr of 1.63). Liver Function Tests:  Recent Labs Lab 12/13/14 0430 12/14/14 0505 12/15/14 0305 12/16/14 0515 12/17/14 0543  AST 28 34 20 35 16  ALT 10* 11* 11* 11* 9*  ALKPHOS 790* 749* 651* 540* 449*  BILITOT 0.4 0.6 0.3 0.7 0.4  PROT 4.6* 4.7* 5.0* 4.9* 5.0*  ALBUMIN <1.0* <1.0* <1.0* <1.0* <1.0*   CBC:  Recent Labs Lab 12/15/14 0305 12/16/14 0515 12/16/14 0850 12/17/14 0543 12/18/14 0508  WBC 21.5* 32.8* 22.8* 19.2* 14.0*  NEUTROABS 17.9* 26.9* 18.6* 15.7* 10.7*  HGB 6.3* 3.8* 7.6* 7.4* 6.6*  HCT 21.5* 11.9* 24.4* 23.5* 21.0*  MCV 92.7 90.8 89.7 89.4 88.6  PLT 507* 586* 505* 551* 571*   CBG:  Recent Labs Lab 12/16/14 2121 12/17/14 0803 12/17/14 1226 12/17/14 1703 12/17/14 2109  GLUCAP 88 88 90 110* 55   Microbiology Recent Results (from the past 240 hour(s))  Clostridium Difficile by PCR     Status: Abnormal   Collection Time: 12/12/14 10:19 AM  Result Value Ref Range Status   C difficile by pcr POSITIVE (A) NEGATIVE Final    Comment: CRITICAL RESULT CALLED TO, READ  BACK BY AND VERIFIED WITH: Bethel Born RN 12:00 12/12/14 ([email or web address])   Clostridium Difficile by PCR     Status: None   Collection Time: 12/14/14  3:14 PM  Result Value Ref Range Status   C difficile by pcr  NEGATIVE Final    TEST PERFORMED ONCE PER 72 HOURS/CREDITED PENDING REORDER 12/15/14     Medications:   . sodium chloride   Intravenous Once  . sodium chloride   Intravenous Once  . antiseptic oral rinse  7 mL Mouth Rinse q12n4p  . dextrose  25 mL Intravenous Once  . feeding supplement (ENSURE ENLIVE)  237 mL Oral BID BM  . feeding supplement (PRO-STAT SUGAR FREE 64)  30 mL Oral TID  . fluconazole (DIFLUCAN) IV  400 mg Intravenous Q24H  . heparin subcutaneous  5,000 Units Subcutaneous 3 times per day  . lip balm  1 application Topical BID  . methocarbamol  500 mg Oral TID  . metoprolol tartrate  12.5 mg Oral BID  . mirtazapine  15 mg Oral QHS  . morphine  15 mg Oral Q12H  . piperacillin-tazobactam (ZOSYN)  IV  3.375 g Intravenous Q8H  . prednisoLONE acetate  1 drop Both Eyes BID  . saccharomyces boulardii  250 mg Oral BID   Continuous Infusions: . dextrose 5 % and 0.9% NaCl 10 mL/hr at 12/16/14 1839    Time spent: 25 minutes.    LOS: 14 days   [facility] Pager [phone]. If unable to reach me by pager, please call my cell phone at [phone].  *Please refer to [email or web address], password [email or web address] to get updated schedule on who will round on this patient, as hospitalists switch teams weekly. If 7PM-7AM, please contact night-coverage at [email or web address], password [email or web address] for any overnight needs.  12/18/2014, 7:37 AM

## 2014-12-19 LAB — TYPE AND SCREEN
ABO/RH(D): O POS
Antibody Screen: NEGATIVE
Unit division: 0
Unit division: 0

## 2014-12-19 LAB — BASIC METABOLIC PANEL
Anion gap: 10 (ref 5–15)
BUN: 20 mg/dL (ref 6–20)
CO2: 23 mmol/L (ref 22–32)
Calcium: 7.3 mg/dL — ABNORMAL LOW (ref 8.9–10.3)
Chloride: 105 mmol/L (ref 101–111)
Creatinine, Ser: 1.53 mg/dL — ABNORMAL HIGH (ref 0.61–1.24)
GFR calc Af Amer: >60 mL/min (ref >60)
GFR, EST NON AFRICAN AMERICAN: 55 mL/min — AB (ref >60)
Glucose, Bld: 73 mg/dL (ref 70–99)
POTASSIUM: 3.9 mmol/L (ref 3.5–5.1)
SODIUM: 138 mmol/L (ref 135–145)

## 2014-12-19 LAB — CBC
HCT: 24.1 % — ABNORMAL LOW (ref 39.0–52.0)
Hemoglobin: 7.7 g/dL — ABNORMAL LOW (ref 13.0–17.0)
MCH: 28 pg (ref 26.0–34.0)
MCHC: 32 g/dL (ref 30.0–36.0)
MCV: 87.6 fL (ref 78.0–100.0)
PLATELETS: 575 10*3/uL — AB (ref 150–400)
RBC: 2.75 MIL/uL — ABNORMAL LOW (ref 4.22–5.81)
RDW: 18.1 % — AB (ref 11.5–15.5)
WBC: 11.7 10*3/uL — AB (ref 4.0–10.5)

## 2014-12-19 LAB — GLUCOSE, CAPILLARY
GLUCOSE-CAPILLARY: 92 mg/dL (ref 70–99)
GLUCOSE-CAPILLARY: 82 mg/dL (ref 70–99)
Glucose-Capillary: 92 mg/dL (ref 70–99)
Glucose-Capillary: 77 mg/dL (ref 70–99)

## 2014-12-19 MED ORDER — LOPERAMIDE HCL 2 MG PO CAPS: 4.0000 mg | ORAL_CAPSULE | Freq: Three times a day (TID) | ORAL | Status: DC

## 2014-12-19 MED ORDER — LOPERAMIDE HCL 2 MG PO CAPS
4.0000 mg | ORAL_CAPSULE | Freq: Two times a day (BID) | ORAL | Status: DC
  Administered 2014-12-19 – 2014-12-20 (×2): 4 mg via ORAL
  Filled 2014-12-19 (×3): qty 2

## 2014-12-19 NOTE — Clinical Social Work Note (Signed)
Clinical Social Worker was requested by patient and patient wife following PT session.  Patient and patient wife both state that PT states that patient will be safe at home and was mobile around the room.  Patient and patient wife will have transportation with patient brother tomorrow afternoon for discharge.  CSW confirmed patient plans to discharge home with [facility].  CSW to update CM on patient discharge plans.    Clinical Social Worker will sign off for now as social work intervention is no longer needed. Please consult [place] again if new need arises.  Barbette Or, [place]

## 2014-12-19 NOTE — Clinical Social Work Note (Signed)
Clinical Social Worker continuing to follow patient and family for support and discharge planning needs.  MD requested follow up with patient regarding SNF placement.  CSW spoke with patient and patient wife at bedside about concerns with patient returning home - patient hesitantly agreed with SNF placement at discharge.  CSW initiated referral and will follow up with patient and wife with available bed offers.  CSW remains available for support and to facilitate patient discharge needs once medically stable.  Barbette Or, [place]

## 2014-12-19 NOTE — Discharge Summary (Deleted)
15 Days Post-Op  Subjective: Pt looks great today.  Output from ostomy is thickening up significantly.  Eating much better  Objective: Vital signs in last 24 hours: Temp:  [98.3 F (36.8 C)-99.3 F (37.4 C)] 98.3 F (36.8 C) (05/10 0503) Pulse Rate:  [98-113] 103 (05/10 0503) Resp:  [16-17] 17 (05/10 0503) BP: (135-142)/(97-107) 139/100 mmHg (05/10 0620) SpO2:  [96 %-100 %] 98 % (05/10 0503) Last BM Date: 12/18/14  Intake/Output from previous day: 05/09 0701 - 05/10 0700 In: 1450 [I.V.:520; Blood:330; IV Piggyback:600] Out: 3265 [Urine:3015; Stool:250] Intake/Output this shift: Total I/O In: 240 [P.O.:240] Out: 675 [Urine:675]  PE: Abd: soft, appropriately tender, +BS, ND, ostomy output is thick/pudding like liquid. Wound is cleaning up  Lab Results:   Recent Labs  12/18/14 0508 12/19/14 0430  WBC 14.0* 11.7*  HGB 6.6* 7.7*  HCT 21.0* 24.1*  PLT 571* 575*   BMET  Recent Labs  12/17/14 0543 12/19/14 0430  NA 139 138  K 4.0 3.9  CL 109 105  CO2 20* 23  GLUCOSE 86 73  BUN 20 20  CREATININE 1.63* 1.53*  CALCIUM 7.3* 7.3*   PT/INR No results for input(s): LABPROT, INR in the last 72 hours. CMP     Component Value Date/Time   NA 138 12/19/2014 0430   K 3.9 12/19/2014 0430   CL 105 12/19/2014 0430   CO2 23 12/19/2014 0430   GLUCOSE 73 12/19/2014 0430   BUN 20 12/19/2014 0430   CREATININE 1.53* 12/19/2014 0430   CALCIUM 7.3* 12/19/2014 0430   PROT 5.0* 12/17/2014 0543   ALBUMIN <1.0* 12/17/2014 0543   AST 16 12/17/2014 0543   ALT 9* 12/17/2014 0543   ALKPHOS 449* 12/17/2014 0543   BILITOT 0.4 12/17/2014 0543   GFRNONAA 55* 12/19/2014 0430   GFRAA >60 12/19/2014 0430   Lipase  No results found for: LIPASE     Studies/Results: No results found.  Anti-infectives: Anti-infectives    Start     Dose/Rate Route Frequency Ordered Stop   12/12/14 1400  metroNIDAZOLE (FLAGYL) tablet 500 mg  Status:  Discontinued     500 mg Oral 3 times per day  12/12/14 1011 12/14/14 1404   12/12/14 1300  fluconazole (DIFLUCAN) IVPB 400 mg  Status:  Discontinued     400 mg 100 mL/hr over 120 Minutes Intravenous Every 24 hours 12/12/14 1015 12/19/14 1028   12/07/14 1300  fluconazole (DIFLUCAN) IVPB 200 mg  Status:  Discontinued     200 mg 100 mL/hr over 60 Minutes Intravenous Every 24 hours 12/07/14 1102 12/12/14 1015   12/06/14 1300  fluconazole (DIFLUCAN) IVPB 400 mg  Status:  Discontinued     400 mg 100 mL/hr over 120 Minutes Intravenous Every 24 hours 12/06/14 1119 12/07/14 1102   12/06/14 0000  micafungin (MYCAMINE) 100 mg in sodium chloride 0.9 % 100 mL IVPB  Status:  Discontinued     100 mg 100 mL/hr over 1 Hours Intravenous Daily 12/05/14 1108 12/05/14 1332   12/05/14 1500  micafungin (MYCAMINE) 100 mg in sodium chloride 0.9 % 100 mL IVPB  Status:  Discontinued     100 mg 100 mL/hr over 1 Hours Intravenous Daily 12/05/14 1332 12/06/14 1045   12/05/14 1200  metroNIDAZOLE (FLAGYL) IVPB 500 mg  Status:  Discontinued     500 mg 100 mL/hr over 60 Minutes Intravenous Every 8 hours 12/05/14 1108 12/08/14 0805   12/05/14 1000  micafungin (MYCAMINE) 100 mg in sodium chloride 0.9 % 100  mL IVPB  Status:  Discontinued     100 mg 100 mL/hr over 1 Hours Intravenous Daily 12/04/14 2255 12/04/14 2343   12/05/14 0000  fluconazole (DIFLUCAN) IVPB 200 mg  Status:  Discontinued     200 mg 100 mL/hr over 60 Minutes Intravenous Every 24 hours 12/04/14 2259 12/05/14 1107   12/05/14 0000  vancomycin (VANCOCIN) IVPB 1000 mg/200 mL premix  Status:  Discontinued     1,000 mg 200 mL/hr over 60 Minutes Intravenous Every 8 hours 12/04/14 2356 12/06/14 0850   12/04/14 1515  piperacillin-tazobactam (ZOSYN) IVPB 3.375 g  Status:  Discontinued     3.375 g 12.5 mL/hr over 240 Minutes Intravenous Every 8 hours 12/04/14 1510 12/19/14 1028   12/04/14 1415  vancomycin (VANCOCIN) 50 mg/mL oral solution 125 mg  Status:  Discontinued     125 mg Oral 4 times per day 12/04/14  1402 12/04/14 2346   12/04/14 1415  ciprofloxacin (CIPRO) IVPB 400 mg  Status:  Discontinued     400 mg 200 mL/hr over 60 Minutes Intravenous Every 12 hours 12/04/14 1402 12/04/14 1504       Assessment/Plan  POD 14, s/p subtotal colectomy with ileostomy---Dr. Donne Hazel -continue soft diet -continue to mobilize -cont dressing changes -needs to be oob -Imodium started, but only BID since output is thickening up ID-Zosyn and fluconazole stopped DVT prophylaxis -heparin/SCDs Dispo-surgically stable for dc home, when ok with primary service   LOS: 15 days    Sanyia Dini E 12/19/2014, 11:31 AM Pager: [phone]

## 2014-12-19 NOTE — Discharge Instructions (Signed)
[facility]      [facility] °[phone] ° °OPEN ABDOMINAL SURGERY: POST OP INSTRUCTIONS ° °Always review your discharge instruction sheet given to you by the facility where your surgery was performed. ° °IF YOU HAVE DISABILITY OR FAMILY LEAVE FORMS, YOU MUST BRING THEM TO THE OFFICE FOR PROCESSING.  PLEASE DO NOT GIVE THEM TO YOUR DOCTOR. ° °1. A prescription for pain medication may be given to you upon discharge.  Take your pain medication as prescribed, if needed.  If narcotic pain medicine is not needed, then you may take acetaminophen (Tylenol) or ibuprofen (Advil) as needed. °2. Take your usually prescribed medications unless otherwise directed. °3. If you need a refill on your pain medication, please contact your pharmacy. They will contact our office to request authorization.  Prescriptions will not be filled after 5pm or on week-ends. °4. You should follow a light diet the first few days after arrival home, such as soup and crackers, pudding, etc.unless your doctor has advised otherwise. A high-fiber, low fat diet can be resumed as tolerated.   Be sure to include lots of fluids daily. Most patients will experience some swelling and bruising on the chest and neck area.  Ice packs will help.  Swelling and bruising can take several days to resolve °5. Most patients will experience some swelling and bruising in the area of the incision. Ice pack will help. Swelling and bruising can take several days to resolve..  °6. It is common to experience some constipation if taking pain medication after surgery.  Increasing fluid intake and taking a stool softener will usually help or prevent this problem from occurring.  A mild laxative (Milk of Magnesia or Miralax) should be taken according to package directions if there are no bowel movements after 48 hours. °7.  You may have steri-strips (small skin tapes) in place directly over the incision.  These strips should be left on the skin for 7-10 days.  If your  surgeon used skin glue on the incision, you may shower in 24 hours.  The glue will flake off over the next 2-3 weeks.  Any sutures or staples will be removed at the office during your follow-up visit. You may find that a light gauze bandage over your incision may keep your staples from being rubbed or pulled. You may shower and replace the bandage daily. °8. ACTIVITIES:  You may resume regular (light) daily activities beginning the next day--such as daily self-care, walking, climbing stairs--gradually increasing activities as tolerated.  You may have sexual intercourse when it is comfortable.  Refrain from any heavy lifting or straining until approved by your doctor. °a. You may drive when you no longer are taking prescription pain medication, you can comfortably wear a seatbelt, and you can safely maneuver your car and apply brakes °b. Return to Work: ___________________________________ °9. You should see your doctor in the office for a follow-up appointment approximately two weeks after your surgery.  Make sure that you call for this appointment within a day or two after you arrive home to insure a convenient appointment time. °OTHER INSTRUCTIONS:  °_____________________________________________________________ °_____________________________________________________________ ° °WHEN TO CALL YOUR DOCTOR: °1. Fever over 101.0 °2. Inability to urinate °3. Nausea and/or vomiting °4. Extreme swelling or bruising °5. Continued bleeding from incision. °6. Increased pain, redness, or drainage from the incision. °7. Difficulty swallowing or breathing °8. Muscle cramping or spasms. °9. Numbness or tingling in hands or feet or around lips. ° °The clinic staff is available to   answer your questions during regular business hours.  Please dont hesitate to call and ask to speak to one of the nurses if you have concerns.  For further questions, please visit [email or web address]  Ileostomy Home Guide An ileostomy is an  opening for stool to leave your body when a medical condition prevents it from leaving through the usual opening (rectum). During a surgery, a piece of small intestine (ileum) is brought through a hole in the abdominal wall. The new opening is called a stoma or ostomy. A bag or pouch fits over the stoma to catch stool and gas. Your stool may be liquid at first. Over time, it may become about the consistency of applesauce. CARING FOR YOUR STOMA Normally, the stoma looks a lot like the inside of your cheek: pink and moist. At first, it may be swollen, but this swelling will decrease within 6 weeks. Keep the skin around the stoma clean and dry. You can gently wash your stoma in the shower with a clean, soft washcloth. If you develop any skin irritation, your caregiver may give you a stoma powder or ointment to help heal the area. Do not use any products other than those specifically given to you by your caregiver.  Your stoma should not be uncomfortable. If you notice any stinging or burning, your pouch may be leaking, and the skin around your stoma may be coming into contact with stool. This can cause skin irritation. If you notice stinging, you should replace your pouch with a new one and discard the old one. OSTOMY POUCHES The pouch that fits over the ostomy can be made up of either 1 or 2 pieces. A one-piece pouch has a skin barrier piece and the pouch itself in one unit. A two-piece pouch has a skin barrier with a separate pouch that snaps on and off of the skin barrier. Either way, you should empty the pouch when it is only  to  full. Do not let more stool or gas build up. This could cause the pouch to leak. Some ostomy bags have a built-in gas release valve. Ostomy deodorizer (5 drops) can be put into the pouch to prevent odor. Some people use an ostomy lubricant to help the stool slide out of the bag more easily and completely.  EMPTYING YOUR OSTOMY POUCH You may get lessons on how to empty your pouch  from a wound-ostomy nurse before you leave the hospital. Here are the basic steps:  Wash your hands with soap and water.  Sit far back on the toilet.  Put pieces of toilet paper into the toilet water. This will prevent splashing as you empty the stool into the toilet bowl.  Unclip or unvelcro the tail end of the pouch.  Unroll the tail and empty stool into the toilet.  Clean the tail with toilet paper.  Reroll the tail, and clip or velcro it closed.  Wash your hands again. CHANGING YOUR OSTOMY POUCH Change your ostomy pouch about every 3 to 4 days for the first 6 weeks, then every 5 to 7 days. Always change the bag sooner if you begin to notice any discomfort or irritation of the skin around the stoma. When possible, plan to change your ostomy pouching system before eating and drinking because this will lessen the chance of stool coming out during the change. A wound-ostomy nurse may teach you how to change your pouch before you leave the hospital. Here are the basic steps:  Lay out your supplies.  Wash your hands with soap and water.  Carefully remove the old pouch.  Wash the stoma and the skin around the stoma. Allow the area to dry. Men may be advised to shave any hair around the stoma very carefully. This will make the adhesive stick better.  Use the stoma measuring guide that comes with your pouch set to decide what size hole you will need to cut in the skin barrier piece. Choose the smallest possible size that will hold the stoma but will not touch it.  Use the guide to trace the circle on the back of the skin barrier piece. Cut out the hole.  Hold the skin barrier piece over the stoma to make sure the hole is the correct size.  Remove the adhesive paper backing from the skin barrier piece.  Squeeze stoma paste around the opening of the skin barrier piece.  Clean and dry the skin around the stoma again.  Carefully fit the skin barrier piece over your stoma.  If you are  using a two-piece pouch, snap the pouch onto the skin barrier piece.  Close the tail of the pouch.  Put your hand over the top of the skin barrier piece to help warm it for about 5 minutes, so that it conforms to your body better.  Wash your hands again. DIET TIPS Because you have a higher risk of a blockage in the first 2 months after getting an ileostomy, you should decrease your fiber intake for that time period. Fibrous foods include:  Celery.  Cabbage (and coleslaw).  Pineapple.  Mushrooms.  Corn and popcorn.  Whole fruits and vegetables, especially with the skins on.  Foods that contain seeds.  Oranges, grapefruit, tangerines, and other citrus fruit.  Nuts. After about 2 months, you can slowly add these kinds of foods back into your regular diet, as tolerated. Other tips:  Drink about eight, 8 oz glasses of water each day.  You can prevent gas by eating slowly and chewing your food thoroughly.  If you feel concerned that you have too much gas, you can cut back on gas-producing foods, such as:  Spicy foods.  Onions and garlic.  Cruciferous vegetables (cabbage, broccoli, cauliflower, Brussels sprouts).  Beans and legumes.  Some cheeses.  Eggs.  Fish.  Bubbly (carbonated) drinks.  Chewing gum. GENERAL TIPS  You can shower with or without the bag in place.  Always keep the bag snapped on if you are bathing or swimming.  If your bag gets wet, you can dry it with a blow-dryer set to cool.  Avoid wearing tight clothing directly over your stoma so that it does not become irritated or bleed. Tight clothing can also prevent the stool from draining into the pouch, which can cause it to leak.  It is helpful to always have an extra skin barrier and pouch with you when traveling. Do not leave them anywhere too warm, because parts of them can melt.  Do not let your seat belt rest on your stoma. Try to keep the seat belt either above or below your stoma, or  use a tiny pillow to cushion it.  You can still participate in sports, but you should avoid activities in which there is a risk of getting hit in the abdomen.  You can still have sex. It is a good idea to empty your pouch prior to sex. Some people and their partners feel very comfortable seeing the pouch during sex. Others choose to wear lingerie or a  T-shirt that covers the device. SEEK IMMEDIATE MEDICAL CARE IF:  You feel dizzy, light-headed, or faint.  You measure pouch drainage of more than 1,500 mL per day. This amount of drainage can lead to dehydration.  You notice a change in the size or color of the stoma, especially if it becomes very red, purple, black, or pale white.  You have bloody stools or bleeding from the ostomy.  You have abdominal pain, nausea, vomiting, or bloating.  There is anything unusual protruding from the ostomy.  You have irritation or red skin around the ostomy.  No stool is passing from the stoma.  You have diarrhea (requiring pouch emptying more frequently than normal). Document Released: 07/31/2003 Document Revised: 12/12/2013 Document Reviewed: 12/25/2010 [facility] Patient Information 2015 [place], [place]. This information is not intended to replace advice given to you by your health care provider. Make sure you discuss any questions you have with your health care provider.  Dressing Change A dressing is a material placed over wounds. It keeps the wound clean, dry, and protected from further injury. This provides an environment that favors wound healing.  BEFORE YOU BEGIN  Get your supplies together. Things you may need include:  Saline solution.  Flexible gauze dressing.  Medicated cream.  Tape.  Gloves.  Abdominal dressing pads.  Gauze squares.  Plastic bags.  Take pain medicine 30 minutes before the dressing change if you need it.  Take a shower before you do the first dressing change of the day. Use plastic wrap or a plastic bag  to prevent the dressing from getting wet. REMOVING YOUR OLD DRESSING   Wash your hands with soap and water. Dry your hands with a clean towel.  Put on your gloves.  Remove any tape.  Carefully remove the old dressing. If the dressing sticks, you may dampen it with warm water to loosen it, or follow your caregiver's specific directions.  Remove any gauze or packing tape that is in your wound.  Take off your gloves.  Put the gloves, tape, gauze, or any packing tape into a plastic bag. CHANGING YOUR DRESSING  Open the supplies.  Take the cap off the saline solution.  Open the gauze package so that the gauze remains on the inside of the package.  Put on your gloves.  Clean your wound as told by your caregiver.  If you have been told to keep your wound dry, follow those instructions.  Your caregiver may tell you to do one or more of the following:  Pick up the gauze. Pour the saline solution over the gauze. Squeeze out the extra saline solution.  Put medicated cream or other medicine on your wound if you have been told to do so.  Put the solution soaked gauze only in your wound, not on the skin around it.  Pack your wound loosely or as told by your caregiver.  Put dry gauze on your wound.  Put abdominal dressing pads over the dry gauze if your wet gauze soaks through.  Tape the abdominal dressing pads in place so they will not fall off. Do not wrap the tape completely around the affected part (arm, leg, abdomen).  Wrap the dressing pads with a flexible gauze dressing to secure it in place.  Take off your gloves. Put them in the plastic bag with the old dressing. Tie the bag shut and throw it away.  Keep the dressing clean and dry until your next dressing change.  Wash your hands. [place]  CARE IF:  Your skin around the wound looks red.  Your wound feels more tender or sore.  You see pus in the wound.  Your wound smells bad.  You have a fever.  Your skin  around the wound has a rash that itches and burns.  You see black or yellow skin in your wound that was not there before.  You feel nauseous, throw up, and feel very tired. Document Released: 09/04/2004 Document Revised: 10/20/2011 Document Reviewed: 06/09/2011 [facility] Patient Information 2015 [place], [place]. This information is not intended to replace advice given to you by your health care provider. Make sure you discuss any questions you have with your health care provider.

## 2014-12-19 NOTE — Progress Notes (Signed)
Notified Erin Hearing with [facility] that discharge anticipated for today. [place] RN PT OT Aide and dressing changes. Pt is an established pt with [facility].

## 2014-12-19 NOTE — Progress Notes (Signed)
Physical Therapy Treatment Patient Details Name: Vincent Black MRN: [id number] DOB: 04/10/1971 Today's Date: 12/19/2014    History of Present Illness  Pt is 44 yo male with Crohn's with multiple admi due to C-diff and pericolonic abscess. 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    PT Comments    Pt making steady progress. Wife has been assisting pt up today.  Follow Up Recommendations  Home health PT;Supervision/Assistance - 24 hour     Equipment Recommendations  Wheelchair (measurements PT);3in1 (PT);Rolling walker with 5" wheels    Recommendations for Other Services       Precautions / Restrictions Precautions Precautions: Fall Precaution Comments: colostomy Restrictions Weight Bearing Restrictions: No    Mobility  Bed Mobility Overal bed mobility: Needs Assistance Bed Mobility: Supine to Sit     Supine to sit: Supervision;HOB elevated   Sit to sidelying: Min assist General bed mobility comments: use of rails. Assist to bring feet up to ease discomfort.  Transfers Overall transfer level: Needs assistance Equipment used: Rolling walker (2 wheeled) Transfers: Sit to/from Stand Sit to Stand: Min assist         General transfer comment: Pt uses hands on walker to push up. This reduces his discomfort  Ambulation/Gait Ambulation/Gait assistance: Min assist Ambulation Distance (Feet): 12 Feet Assistive device: Rolling walker (2 wheeled) Gait Pattern/deviations: Step-through pattern;Decreased step length - right;Decreased step length - left Gait velocity: slow Gait velocity interpretation: Below normal speed for age/gender General Gait Details: Assist for support.   Stairs            Wheelchair Mobility    Modified Rankin (Stroke Patients Only)       Balance Overall balance assessment: Needs assistance Sitting-balance support: No upper extremity  supported;Feet supported Sitting balance-Leahy Scale: Good Sitting balance - Comments: pt able to maintain sitting EOB without support, discussed the possibilty of sitting EOB for meals if not in chair   Standing balance support: Bilateral upper extremity supported Standing balance-Leahy Scale: Poor Standing balance comment: walker and min guard for static standing.                    Cognition Arousal/Alertness: Awake/alert Behavior During Therapy: WFL for tasks assessed/performed;Flat affect Overall Cognitive Status: Within Functional Limits for tasks assessed                      Exercises General Exercises - Lower Extremity Ankle Circles/Pumps: AROM;Both;10 reps;Seated Long Arc Quad: AROM;Both;5 reps;Seated (with 3 sec hold) Hip Flexion/Marching: AROM;Both;5 reps;Seated    General Comments        Pertinent Vitals/Pain Faces Pain Scale: Hurts little more Pain Location: abdomen Pain Intervention(s): Limited activity within patient's tolerance;Repositioned    Home Living                      Prior Function            PT Goals (current goals can now be found in the care plan section) Acute Rehab PT Goals PT Goal Formulation: With patient/family Time For Goal Achievement: 12/28/14 Potential to Achieve Goals: Good Progress towards PT goals: Progressing toward goals    Frequency  Min 3X/week    PT Plan Current plan remains appropriate    Co-evaluation             End of Session   Activity Tolerance: No increased pain Patient  left: with family/visitor present;with call bell/phone within reach;in bed     Time: 1545-1557 PT Time Calculation (min) (ACUTE ONLY): 12 min  Charges:  $Gait Training: 8-22 mins                    G Codes:      Wandalene Abrams Jan 13, 2015, 4:41 PM  [employer] PT [phone]

## 2014-12-19 NOTE — Progress Notes (Addendum)
Progress Note   MATSON WELCH GMW:[id number] DOB: 01-13-1971 DOA: 12/04/2014 PCP: No PCP Per Patient   Gastroenterologist: Dr. Benson Norway (uses him as PCP)   Brief Narrative:   Vincent Black is an 44 y.o. male with a PMH of anxiety, ankylosing spondylitis, Crohn's disease and Clostridium difficile colitis who was admitted 12/04/14 the chief complaint of worsening abdominal pain, found to have a perforated sigmoid colon and perforated cecal toxic megacolon as well as small bowel fistula. He underwent exploratory laparotomy/small bowel resection with anastomosis and total abdominal colectomy with end ileostomy on 12/04/14.  He has now completed 2 weeks of therapy with Zosyn and Diflucan, as well as Flagyl for C. Difficile colitis with F/U PCR testing negative.  He has an open abdominal wound for which he has been receiving wound care with wet to dry dressing changes.  Barrier to discharge is severe deconditioning.  Evaluated by CIR, not felt to be a CIR candidate.    Assessment/Plan:   Principal Problem: Septic shock secondary to Perforated sigmoid colon, perforated cecal toxic megacolon, and small bowel fistula in the setting of Crohn's disease  - s/p ex lap and SBR with anastomosis, total abdominal colectomy with end ileostomy (4/25 - Dr. Donne Hazel). - Status post 2 weeks of therapy with Zosyn/fluconazole as well as 10 days of flagyl. - Wound cultures positive for Candida albicans.  Blood cultures negative. - Continue wet to dry dressing changes to open abdominal wound.  Active problems:  Severe deconditioning - Evaluated by CIR, not a candidate. - Too weak to go home. SW to re-evaluate for possible SNF placement for rehab. - Continue PT.  OOB--->chair for meals, ambulate TID.  Severe protein calorie malnutrition / unintentional weight loss - The patient has lost over 50 pounds in the past 6 months.  Albumin < 1. - Continue nutritional supplements. - Continue Remeron for appetite  stimulation.  C.diff colitis  - S/P 10 days of Flagyl. - Repeat C diff negative.   Sinus tachycardia - Improved with treatment of sepsis.  Acute renal failure/Decreased uop - Prerenal in etiology with significant third spacing of fluid. - Creatinine improving.  Normocytic Anemia - chronic - Transfuse for Hgb < 7. Received 1 unit of blood 12/18/14 (2 units total). - Fecal occult blood negative 12/18/14.  Thrombocytosis  - Monitoring.  Still elevated, likely reactive from recent sepsis.  Leukocytosis - Secondary to sepsis, WBC continues to improve.  Acute on chronic pain uncontrolled  - Continue Robaxin, MS Contin and OxyIR for breakthrough pain.  Anxiety -PRN Ativan.  DVT Prophylaxis - Continue subcutaneous heparin.  Code Status: Full. Family Communication: Wife and father at the bedside.   Disposition Plan: SNF for rehab if consents, otherwise home with PT/OT/RN in 1-2 days.   IV Access:    CVC right internal jugular placed 12/04/14, D/C.   Procedures and diagnostic studies:   Dg Chest 1 View  12/04/2014   CLINICAL DATA:  Fever. Nasogastric tube placement. Crohn disease. Anxiety.  EXAM: CHEST  1 VIEW  COMPARISON:  CT of 11/17/2014  FINDINGS: Nasogastric terminates at the body of the stomach. Suspect remote anterior right rib trauma including at the second and possibly the third anterior right ribs. Midline trachea. Normal heart size. No pleural fluid. Low lung volumes. Patchy bibasilar atelectasis. Free intraperitoneal air under both hemidiaphragms.  IMPRESSION: 1. Appropriate position of nasogastric tube. 2. Extensive free intraperitoneal air. Critical test results telephoned toMaggie, r.n. at the time of interpretation at 4:40  p.m.on . 12/04/2014.   Electronically Signed   By: Abigail Miyamoto M.D.   On: 12/04/2014 16:38   Ir Sinus/fist Tube Chk-non Gi  11/28/2014   CLINICAL DATA:  History of Crohn's disease and colonic perforation with development of an intraperitoneal  abscess.  Patient underwent CT-guided percutaneous drainage catheter placement on 10/27/2014 with subsequent CT scan performed 11/17/2014 demonstrating near complete resolution of the abdominal abscess. Contrast injection of the percutaneous drain also performed 11/17/2014 demonstrated a persistent fistulous connection with the adjacent bowel.  Patient returns to the Interventional Radiology Clinic for repeat fluoroscopic guided percutaneous drainage catheter injection. The patient reports minimal to no output from the percutaneous drainage catheter. The patient has not been flushing the percutaneous drain.  EXAM: SINUS TRACT INJECTION/FISTULOGRAM  COMPARISON:  CT abdomen and pelvis- 11/17/2014; 11/03/2014; 10/26/2014; CT-guided percutaneous drainage catheter placement -10/27/2014 ; fluoroscopic guided percutaneous drainage catheter injection- 11/17/2014  CONTRAST:  10 mL OMNIPAQUE IOHEXOL 300 MG/ML SOLN - injected via the existing left lower quadrant percutaneous drainage catheter.  FLUOROSCOPY TIME:  1 minute, 18 seconds (33.8 mGy).  TECHNIQUE: Patient was placed supine on the fluoroscopy table.  A preprocedural spot fluoroscopic image was obtained of the left lower abdominal quadrant and existing percutaneous drainage catheter.  Multiple spot fluoroscopic and radiographic images were obtained in various obliquities following the injection of a small amount of contrast via the percutaneous drainage catheter.  Images were reviewed and the procedure was terminated. The drainage catheter was flushed with a small amount of saline and reconnected to a gravity bag. A dressing was placed. The patient tolerated the procedure well without immediate postprocedural complication.  FINDINGS: Preprocedural spot fluoroscopic image demonstrates unchanged positioning of the percutaneous drainage catheter overlying left lower abdominal quadrant.  Contrast injection confirms a persistent fistulous connection with an adjacent loop of  small bowel.  As such, the drainage catheter was reconnected to a gravity bag.  IMPRESSION: Persistent fistulous connection from the percutaneous drainage catheter to an adjacent loop of small bowel.  PLAN: Patient will return to the interventional radiology drain Clinic on 12/12/2014 for repeat fluoroscopic guided percutaneous drainage catheter injection. If a fistulous connection persists, surgical revision may have to be considered.   Electronically Signed   By: Sandi Mariscal M.D.   On: 11/28/2014 14:25   Dg Chest Port 1 View  12/07/2014   CLINICAL DATA:  Pulmonary edema.  EXAM: PORTABLE CHEST - 1 VIEW  COMPARISON:  12/06/2014  FINDINGS: Low lung volumes with bibasilar atelectasis and vascular congestion, similar to prior study. Lucency under the right hemidiaphragm again noted, possibly pneumoperitoneum. NG tube and right central line remain in place, unchanged.  IMPRESSION: Low lung volumes with bibasilar atelectasis.  Lucency under the right hemidiaphragm again noted, possibly pneumoperitoneum related to recent surgery. May consider decubitus view of the abdomen to assess degree of pneumoperitoneum if felt clinically indicated.   Electronically Signed   By: Rolm Baptise M.D.   On: 12/07/2014 08:50   Dg Chest Port 1 View  12/06/2014   CLINICAL DATA:  Edema  EXAM: PORTABLE CHEST - 1 VIEW  COMPARISON:  12/04/2014  FINDINGS: Low lung volumes with vascular crowding. Possible mild interstitial edema with bibasilar atelectasis. No pneumothorax.  The heart is normal in size.  Right IJ venous catheter terminates at the cavoatrial junction.  Enteric tube traverses in the distal gastric antrum.  Lucency beneath the right hemidiaphragm could suggest free air related to recent surgery.  IMPRESSION: Low lung volumes with vascular crowding  and possible mild interstitial edema.  Bibasilar atelectasis.  Support apparatus as above.   Electronically Signed   By: Julian Hy M.D.   On: 12/06/2014 09:45   Dg Chest Port  1 View  12/04/2014   CLINICAL DATA:  Central line and NG tube placement.  EXAM: PORTABLE CHEST - 1 VIEW  COMPARISON:  Earlier same day  FINDINGS: Grossly unchanged cardiac silhouette and mediastinal contours. Interval placement of a right jugular approach intravenous catheter with tip projected of the superior cavoatrial junction. Enteric tube tip and side port projects over the expected location of the gastric antrum. No pneumothorax. Apparent resolution of previously noted pneumoperitoneum. Improved aeration of lung bases without focal airspace opacity. No pleural effusion or pneumothorax. Unchanged bones.  IMPRESSION: 1. Appropriately positioned support apparatus as above. No pneumothorax. 2. Apparent resolution of previously noted large amount of pneumoperitoneum. 3. Improved aeration lungs with resolved bibasilar atelectasis.   Electronically Signed   By: Sandi Mariscal M.D.   On: 12/04/2014 21:19   Dg Abd Portable 1v  12/07/2014   CLINICAL DATA:  Nasogastric tube placement.  Initial encounter.  EXAM: PORTABLE ABDOMEN - 1 VIEW  COMPARISON:  Abdominal radiograph performed 12/04/2014  FINDINGS: The patient's enteric tube is noted ending overlying the body of the stomach.  The visualized bowel gas pattern is unremarkable. A bowel suture line is noted at the left lower quadrant. An ostomy is seen at the right lower quadrant. Scattered air and stool filled loops of colon are seen; no abnormal dilatation of small bowel loops is seen to suggest small bowel obstruction. No free intra-abdominal air is identified, though evaluation for free air is limited on a single supine view.  The visualized osseous structures are within normal limits; the sacroiliac joints are unremarkable in appearance.  A drainage catheter is noted overlying the upper pelvis.  IMPRESSION: 1. Enteric tube noted ending overlying the body of the stomach. 2. Unremarkable bowel gas pattern; postoperative change at the lower quadrants. No free  intra-abdominal air seen.   Electronically Signed   By: Garald Balding M.D.   On: 12/07/2014 06:32   Dg Abd Portable 1v  12/04/2014   CLINICAL DATA:  Nasogastric tube placement.  EXAM: PORTABLE ABDOMEN - 1 VIEW  COMPARISON:  Abdominal CT 11/17/2014  FINDINGS: There is large pneumoperitoneum with Rigler's sign and outlining of the falciform ligament. The colon is distended with high-density material, likely retained contrast from comparison CT. Small bowel dilatation.  Nasogastric tube is in good position. There is a left lower quadrant percutaneous abscess drain.  Critical Value/emergent results were called by telephone at the time of interpretation on 12/04/2014 at 4:50 pm to Dr. [name] , who verbally acknowledged these results.  IMPRESSION: 1. Large pneumoperitoneum consistent with bowel perforation. 2. Small bowel and colonic distention which could be from low colonic obstruction or ileus. 3. Nasogastric tube is in good position. 4. Left lower quadrant percutaneous abscess drain.   Electronically Signed   By: Monte Fantasia M.D.   On: 12/04/2014 16:51     Medical Consultants:    Meredith Staggers, MD, Physical Medicine and Rehabilitation  Carol Ada, MD, Gastroenterology  Simonne Maffucci, MD, PCCM  Rolm Bookbinder, MD, General Surgery   Anti-Infectives:    Zosyn 4/25 > 12/19/14  Flagyl 4/26 > 12/14/14  Fluconazole 4/27 > 12/19/14  (Received mycafungin PTA)   Subjective:   TALLIN HART continues to report significant abdominal pain, worse with standing.  Only takes a few  steps at a time. Appetite improving.  Occasional nausea persists.  Objective:    Filed Vitals:   12/18/14 1437 12/18/14 2127 12/19/14 0503 12/19/14 0620  BP: 139/99 137/97 135/104 139/100  Pulse: 98 106 103   Temp: 99.1 F (37.3 C) 99.3 F (37.4 C) 98.3 F (36.8 C)   TempSrc: Oral Oral Oral   Resp: 16 17 17    Height:      Weight:      SpO2: 100% 99% 98%     Intake/Output Summary (Last 24  hours) at 12/19/14 0757 Last data filed at 12/19/14 0504  Gross per 24 hour  Intake   1450 ml  Output   3140 ml  Net  -1690 ml    Exam: Gen:  NAD, weak Cardiovascular:  Mildly tachycardic, No M/R/G Respiratory:  Lungs CTAB Gastrointestinal:  Abdomen soft, dressings intact mid abdomen, ostomy right lower quadrant with brown stool present Extremities:  2+ pitting edema bilaterally   Data Reviewed:    Labs: Basic Metabolic Panel:  Recent Labs Lab 12/14/14 0505 12/15/14 0305 12/16/14 0515 12/17/14 0543 12/18/14 0508 12/19/14 0430  NA 142 143 138 139  --  138  K 5.7* 4.8 4.9 4.0  --  3.9  CL 115* 115* 110 109  --  105  CO2 20* 20* 19* 20*  --  23  GLUCOSE 66* 80 77 86  --  73  BUN 17 17 20 20   --  20  CREATININE 1.65* 1.63* 1.69* 1.63*  --  1.53*  CALCIUM 7.1* 7.2* 6.9* 7.3*  --  7.3*  MG 1.5* 1.4* 1.3* 1.6* 1.8  --   PHOS 3.2 3.4 3.4 3.4 3.4  --    GFR Estimated Creatinine Clearance: 62.9 mL/min (by C-G formula based on Cr of 1.53). Liver Function Tests:  Recent Labs Lab 12/13/14 0430 12/14/14 0505 12/15/14 0305 12/16/14 0515 12/17/14 0543  AST 28 34 20 35 16  ALT 10* 11* 11* 11* 9*  ALKPHOS 790* 749* 651* 540* 449*  BILITOT 0.4 0.6 0.3 0.7 0.4  PROT 4.6* 4.7* 5.0* 4.9* 5.0*  ALBUMIN <1.0* <1.0* <1.0* <1.0* <1.0*   CBC:  Recent Labs Lab 12/15/14 0305 12/16/14 0515 12/16/14 0850 12/17/14 0543 12/18/14 0508 12/19/14 0430  WBC 21.5* 32.8* 22.8* 19.2* 14.0* 11.7*  NEUTROABS 17.9* 26.9* 18.6* 15.7* 10.7*  --   HGB 6.3* 3.8* 7.6* 7.4* 6.6* 7.7*  HCT 21.5* 11.9* 24.4* 23.5* 21.0* 24.1*  MCV 92.7 90.8 89.7 89.4 88.6 87.6  PLT 507* 586* 505* 551* 571* 575*   CBG:  Recent Labs Lab 12/18/14 0811 12/18/14 1240 12/18/14 1747 12/18/14 2147 12/18/14 2222  GLUCAP 92 96 85 66* 96   Microbiology Recent Results (from the past 240 hour(s))  Clostridium Difficile by PCR     Status: Abnormal   Collection Time: 12/12/14 10:19 AM  Result Value Ref Range  Status   C difficile by pcr POSITIVE (A) NEGATIVE Final    Comment: CRITICAL RESULT CALLED TO, READ BACK BY AND VERIFIED WITH: Bethel Born RN 12:00 12/12/14 ([email or web address])   Clostridium Difficile by PCR     Status: None   Collection Time: 12/14/14  3:14 PM  Result Value Ref Range Status   C difficile by pcr  NEGATIVE Final    TEST PERFORMED ONCE PER 72 HOURS/CREDITED PENDING REORDER 12/15/14     Medications:   . antiseptic oral rinse  7 mL Mouth Rinse q12n4p  . dextrose  25 mL Intravenous Once  . feeding supplement (  ENSURE ENLIVE)  237 mL Oral BID BM  . feeding supplement (PRO-STAT SUGAR FREE 64)  30 mL Oral TID  . fluconazole (DIFLUCAN) IV  400 mg Intravenous Q24H  . heparin subcutaneous  5,000 Units Subcutaneous 3 times per day  . lip balm  1 application Topical BID  . methocarbamol  500 mg Oral TID  . metoprolol tartrate  12.5 mg Oral BID  . mirtazapine  15 mg Oral QHS  . morphine  15 mg Oral Q12H  . piperacillin-tazobactam (ZOSYN)  IV  3.375 g Intravenous Q8H  . prednisoLONE acetate  1 drop Both Eyes BID  . saccharomyces boulardii  250 mg Oral BID   Continuous Infusions: . dextrose 5 % and 0.9% NaCl 1,000 mL (12/18/14 1149)    Time spent: 25 minutes.    LOS: 15 days   [facility] Pager [phone]. If unable to reach me by pager, please call my cell phone at [phone].  *Please refer to [email or web address], password [email or web address] to get updated schedule on who will round on this patient, as hospitalists switch teams weekly. If 7PM-7AM, please contact night-coverage at [email or web address], password [email or web address] for any overnight needs.  12/19/2014, 7:57 AM

## 2014-12-19 NOTE — Consult Note (Addendum)
WOC ostomy follow up [facility] following for assessment and plan of care to abd wound. Stoma type/location:  Ileostomy to RLQ intact with good seal. Stomal assessment/size: Stoma red and viable when visualized through pouch which is intact with good seal. Ostomy pouching: 2pc.  Education provided:  Wife at bedside states she is independent with pouch emptying and application.  She changed current pouch yesterday without assistance using barrier ring and 2 piece pouching system.  Educational materials at bedside.  Reviewed ordering supplies and dietary precautions.  Pt verbalizes understanding.  Pt and wife deny further questions at this time. Ordered 3 barrier rings, pouches, and wafers for discharge home. Enrolled patient in [facility] Start Discharge program: Yes Julien Girt MSN, RN, [place], [place], [place]

## 2014-12-20 DIAGNOSIS — K630 Abscess of intestine: Secondary | ICD-10-CM

## 2014-12-20 LAB — BASIC METABOLIC PANEL
Anion gap: 8 (ref 5–15)
BUN: 16 mg/dL (ref 6–20)
CHLORIDE: 104 mmol/L (ref 101–111)
CO2: 25 mmol/L (ref 22–32)
CREATININE: 1.45 mg/dL — AB (ref 0.61–1.24)
Calcium: 7.1 mg/dL — ABNORMAL LOW (ref 8.9–10.3)
GFR, EST NON AFRICAN AMERICAN: 58 mL/min — AB (ref >60)
Glucose, Bld: 95 mg/dL (ref 70–99)
POTASSIUM: 3.6 mmol/L (ref 3.5–5.1)
Sodium: 137 mmol/L (ref 135–145)

## 2014-12-20 LAB — CBC
HEMATOCRIT: 23.6 % — AB (ref 39.0–52.0)
Hemoglobin: 7.4 g/dL — ABNORMAL LOW (ref 13.0–17.0)
MCH: 27.7 pg (ref 26.0–34.0)
MCHC: 31.4 g/dL (ref 30.0–36.0)
MCV: 88.4 fL (ref 78.0–100.0)
Platelets: 559 10*3/uL — ABNORMAL HIGH (ref 150–400)
RBC: 2.67 MIL/uL — ABNORMAL LOW (ref 4.22–5.81)
RDW: 17.8 % — ABNORMAL HIGH (ref 11.5–15.5)
WBC: 11 10*3/uL — ABNORMAL HIGH (ref 4.0–10.5)

## 2014-12-20 LAB — GLUCOSE, CAPILLARY
GLUCOSE-CAPILLARY: 88 mg/dL (ref 70–99)
Glucose-Capillary: 101 mg/dL — ABNORMAL HIGH (ref 70–99)
Glucose-Capillary: 214 mg/dL — ABNORMAL HIGH (ref 70–99)

## 2014-12-20 MED ORDER — OXYCODONE HCL 5 MG PO TABS: 5.0000 mg | ORAL_TABLET | ORAL | Status: DC | PRN

## 2014-12-20 MED ORDER — ACETAMINOPHEN 500 MG PO TABS: 500.0000 mg | ORAL_TABLET | Freq: Two times a day (BID) | ORAL | Status: DC

## 2014-12-20 MED ORDER — METOPROLOL TARTRATE 25 MG PO TABS: 12.5000 mg | ORAL_TABLET | Freq: Two times a day (BID) | ORAL | Status: DC

## 2014-12-20 MED ORDER — SODIUM CHLORIDE 0.9 % IJ SOLN
10.0000 mL | INTRAMUSCULAR | Status: DC | PRN
  Administered 2014-12-20 (×2): 10 mL

## 2014-12-20 MED ORDER — MORPHINE SULFATE ER 15 MG PO TBCR: 15.0000 mg | EXTENDED_RELEASE_TABLET | Freq: Two times a day (BID) | ORAL | Status: DC

## 2014-12-20 NOTE — Progress Notes (Signed)
NURSING PROGRESS NOTE  JESSEN SIEGMAN [id number] Discharge Data: 12/20/2014 7:19 PM Attending Provider: No att. providers found PCP:No PCP Per Patient   Myna Hidalgo to be D/C'd Home per MD order via EMS with hard copies of pain prescriptions and rest sent to patient preference pharmacy. All ostomy supplies dc'd with patient and wife. Patient verbalized understanding of all discharge paperwork including follow up appointments.    All IV's will be discontinued and monitored for bleeding.  All belongings will be returned to patient for patient to take home.  Last Documented Vital Signs:  Blood pressure 140/94, pulse 98, temperature 98.6 F (37 C), temperature source Oral, resp. rate 16, height 5\' 9"  (1.753 m), weight 77.3 kg (170 lb 6.7 oz), SpO2 99 %.  Hendricks Limes RN, BS, BSN

## 2014-12-20 NOTE — Progress Notes (Addendum)
Patient will be transported by EMS, patient initially told staff he would go home via family, after ambulation he changed his mind and feels more comfortable with EMS. [facility] worker called and aware.

## 2014-12-20 NOTE — Progress Notes (Signed)
[facility] Progress Note  16 Days Post-Op  Subjective: Pt is doing well.  Tolerating soft diet, ambulating well with therapies.  No N/V.  Having good stools and flatus out of ileostomy.  Urinating well.  Pain well controlled with orals.  Wife is helping with dressing changes and ostomy care.  Drains have been removed.    Objective: Vital signs in last 24 hours: Temp:  [98.3 F (36.8 C)-98.4 F (36.9 C)] 98.3 F (36.8 C) (05/11 0520) Pulse Rate:  [98-106] 98 (05/11 0520) Resp:  [18] 18 (05/11 0520) BP: (127-146)/(97-103) 127/97 mmHg (05/11 0520) SpO2:  [97 %-100 %] 97 % (05/11 0520) Last BM Date: 12/19/14  Intake/Output from previous day: 05/10 0701 - 05/11 0700 In: 600 [P.O.:600] Out: 3600 [Urine:3125; Stool:475] Intake/Output this shift:    PE: Gen:  Alert, NAD, pleasant Abd: Soft, minimally tender, ND, +BS, no HSM, midline wound is clean with good granulation tissue on the edges, but with some yellow slough coating the base of the wound surrounding the sutures   Lab Results:   Recent Labs  12/19/14 0430 12/20/14 0520  WBC 11.7* 11.0*  HGB 7.7* 7.4*  HCT 24.1* 23.6*  PLT 575* 559*   BMET  Recent Labs  12/19/14 0430 12/20/14 0520  NA 138 137  K 3.9 3.6  CL 105 104  CO2 23 25  GLUCOSE 73 95  BUN 20 16  CREATININE 1.53* 1.45*  CALCIUM 7.3* 7.1*   PT/INR No results for input(s): LABPROT, INR in the last 72 hours. CMP     Component Value Date/Time   NA 137 12/20/2014 0520   K 3.6 12/20/2014 0520   CL 104 12/20/2014 0520   CO2 25 12/20/2014 0520   GLUCOSE 95 12/20/2014 0520   BUN 16 12/20/2014 0520   CREATININE 1.45* 12/20/2014 0520   CALCIUM 7.1* 12/20/2014 0520   PROT 5.0* 12/17/2014 0543   ALBUMIN <1.0* 12/17/2014 0543   AST 16 12/17/2014 0543   ALT 9* 12/17/2014 0543   ALKPHOS 449* 12/17/2014 0543   BILITOT 0.4 12/17/2014 0543   GFRNONAA 58* 12/20/2014 0520   GFRAA >60 12/20/2014 0520   Lipase  No results found for:  LIPASE     Studies/Results: No results found.  Anti-infectives: Anti-infectives    Start     Dose/Rate Route Frequency Ordered Stop   12/12/14 1400  metroNIDAZOLE (FLAGYL) tablet 500 mg  Status:  Discontinued     500 mg Oral 3 times per day 12/12/14 1011 12/14/14 1404   12/12/14 1300  fluconazole (DIFLUCAN) IVPB 400 mg  Status:  Discontinued     400 mg 100 mL/hr over 120 Minutes Intravenous Every 24 hours 12/12/14 1015 12/19/14 1028   12/07/14 1300  fluconazole (DIFLUCAN) IVPB 200 mg  Status:  Discontinued     200 mg 100 mL/hr over 60 Minutes Intravenous Every 24 hours 12/07/14 1102 12/12/14 1015   12/06/14 1300  fluconazole (DIFLUCAN) IVPB 400 mg  Status:  Discontinued     400 mg 100 mL/hr over 120 Minutes Intravenous Every 24 hours 12/06/14 1119 12/07/14 1102   12/06/14 0000  micafungin (MYCAMINE) 100 mg in sodium chloride 0.9 % 100 mL IVPB  Status:  Discontinued     100 mg 100 mL/hr over 1 Hours Intravenous Daily 12/05/14 1108 12/05/14 1332   12/05/14 1500  micafungin (MYCAMINE) 100 mg in sodium chloride 0.9 % 100 mL IVPB  Status:  Discontinued     100 mg 100 mL/hr over 1 Hours  Intravenous Daily 12/05/14 1332 12/06/14 1045   12/05/14 1200  metroNIDAZOLE (FLAGYL) IVPB 500 mg  Status:  Discontinued     500 mg 100 mL/hr over 60 Minutes Intravenous Every 8 hours 12/05/14 1108 12/08/14 0805   12/05/14 1000  micafungin (MYCAMINE) 100 mg in sodium chloride 0.9 % 100 mL IVPB  Status:  Discontinued     100 mg 100 mL/hr over 1 Hours Intravenous Daily 12/04/14 2255 12/04/14 2343   12/05/14 0000  fluconazole (DIFLUCAN) IVPB 200 mg  Status:  Discontinued     200 mg 100 mL/hr over 60 Minutes Intravenous Every 24 hours 12/04/14 2259 12/05/14 1107   12/05/14 0000  vancomycin (VANCOCIN) IVPB 1000 mg/200 mL premix  Status:  Discontinued     1,000 mg 200 mL/hr over 60 Minutes Intravenous Every 8 hours 12/04/14 2356 12/06/14 0850   12/04/14 1515  piperacillin-tazobactam (ZOSYN) IVPB 3.375 g   Status:  Discontinued     3.375 g 12.5 mL/hr over 240 Minutes Intravenous Every 8 hours 12/04/14 1510 12/19/14 1028   12/04/14 1415  vancomycin (VANCOCIN) 50 mg/mL oral solution 125 mg  Status:  Discontinued     125 mg Oral 4 times per day 12/04/14 1402 12/04/14 2346   12/04/14 1415  ciprofloxacin (CIPRO) IVPB 400 mg  Status:  Discontinued     400 mg 200 mL/hr over 60 Minutes Intravenous Every 12 hours 12/04/14 1402 12/04/14 1504       Assessment/Plan POD #16, s/p subtotal colectomy with ileostomy---Dr. Donne Hazel -continue soft diet -continue to mobilize, encouraged OOB -cont dressing changes BID WD dressings -Continue Imodium, but only BID since output is thickening up, stool output only 424mL in last 24hr ID-Zosyn and fluconazole discontinued DVT prophylaxis -heparin/SCDs Dispo-surgically stable for dc home today, when ok with primary service.  PT recommending HH with 24 hr assistance.  Follow up with Dr. Donne Hazel in 2-3 weeks for post-op check    LOS: 16 days    DORT, Chele Cornell 12/20/2014, 10:36 AM Pager: [phone]

## 2014-12-20 NOTE — Progress Notes (Signed)
15 Days Post-Op  Subjective: Pt looks great today. Output from ostomy is thickening up significantly. Eating much better  Objective: Vital signs in last 24 hours: Temp: [98.3 F (36.8 C)-99.3 F (37.4 C)] 98.3 F (36.8 C) (05/10 0503) Pulse Rate: [98-113] 103 (05/10 0503) Resp: [16-17] 17 (05/10 0503) BP: (135-142)/(97-107) 139/100 mmHg (05/10 0620) SpO2: [96 %-100 %] 98 % (05/10 0503) Last BM Date: 12/18/14  Intake/Output from previous day: 05/09 0701 - 05/10 0700 In: 1450 [I.V.:520; Blood:330; IV Piggyback:600] Out: 3265 [Urine:3015; Stool:250] Intake/Output this shift: Total I/O In: 240 [P.O.:240] Out: 675 [Urine:675]  PE: Abd: soft, appropriately tender, +BS, ND, ostomy output is thick/pudding like liquid. Wound is cleaning up  Lab Results:   Recent Labs (last 2 labs)      Recent Labs  12/18/14 0508 12/19/14 0430  WBC 14.0* 11.7*  HGB 6.6* 7.7*  HCT 21.0* 24.1*  PLT 571* 575*     BMET  Recent Labs (last 2 labs)      Recent Labs  12/17/14 0543 12/19/14 0430  NA 139 138  K 4.0 3.9  CL 109 105  CO2 20* 23  GLUCOSE 86 73  BUN 20 20  CREATININE 1.63* 1.53*  CALCIUM 7.3* 7.3*     PT/INR  Recent Labs (last 2 labs)     No results for input(s): LABPROT, INR in the last 72 hours.   CMP  Labs (Brief)       Component Value Date/Time   NA 138 12/19/2014 0430   K 3.9 12/19/2014 0430   CL 105 12/19/2014 0430   CO2 23 12/19/2014 0430   GLUCOSE 73 12/19/2014 0430   BUN 20 12/19/2014 0430   CREATININE 1.53* 12/19/2014 0430   CALCIUM 7.3* 12/19/2014 0430   PROT 5.0* 12/17/2014 0543   ALBUMIN <1.0* 12/17/2014 0543   AST 16 12/17/2014 0543   ALT 9* 12/17/2014 0543   ALKPHOS 449* 12/17/2014 0543   BILITOT 0.4 12/17/2014 0543   GFRNONAA 55* 12/19/2014 0430   GFRAA >60 12/19/2014 0430     Lipase   Labs (Brief)    No results found  for: LIPASE       Studies/Results:  Imaging Results (Last 48 hours)    No results found.    Anti-infectives: Anti-infectives    Start   Dose/Rate Route Frequency Ordered Stop   12/12/14 1400  metroNIDAZOLE (FLAGYL) tablet 500 mg Status: Discontinued    500 mg Oral 3 times per day 12/12/14 1011 12/14/14 1404   12/12/14 1300  fluconazole (DIFLUCAN) IVPB 400 mg Status: Discontinued    400 mg 100 mL/hr over 120 Minutes Intravenous Every 24 hours 12/12/14 1015 12/19/14 1028   12/07/14 1300  fluconazole (DIFLUCAN) IVPB 200 mg Status: Discontinued    200 mg 100 mL/hr over 60 Minutes Intravenous Every 24 hours 12/07/14 1102 12/12/14 1015   12/06/14 1300  fluconazole (DIFLUCAN) IVPB 400 mg Status: Discontinued    400 mg 100 mL/hr over 120 Minutes Intravenous Every 24 hours 12/06/14 1119 12/07/14 1102   12/06/14 0000  micafungin (MYCAMINE) 100 mg in sodium chloride 0.9 % 100 mL IVPB Status: Discontinued    100 mg 100 mL/hr over 1 Hours Intravenous Daily 12/05/14 1108 12/05/14 1332   12/05/14 1500  micafungin (MYCAMINE) 100 mg in sodium chloride 0.9 % 100 mL IVPB Status: Discontinued    100 mg 100 mL/hr over 1 Hours Intravenous Daily 12/05/14 1332 12/06/14 1045   12/05/14 1200  metroNIDAZOLE (FLAGYL) IVPB 500 mg Status: Discontinued  500 mg 100 mL/hr over 60 Minutes Intravenous Every 8 hours 12/05/14 1108 12/08/14 0805   12/05/14 1000  micafungin (MYCAMINE) 100 mg in sodium chloride 0.9 % 100 mL IVPB Status: Discontinued    100 mg 100 mL/hr over 1 Hours Intravenous Daily 12/04/14 2255 12/04/14 2343   12/05/14 0000  fluconazole (DIFLUCAN) IVPB 200 mg Status: Discontinued    200 mg 100 mL/hr over 60 Minutes Intravenous Every 24 hours 12/04/14 2259 12/05/14 1107   12/05/14 0000  vancomycin (VANCOCIN) IVPB 1000 mg/200 mL premix Status: Discontinued     1,000 mg 200 mL/hr over 60 Minutes Intravenous Every 8 hours 12/04/14 2356 12/06/14 0850   12/04/14 1515  piperacillin-tazobactam (ZOSYN) IVPB 3.375 g Status: Discontinued    3.375 g 12.5 mL/hr over 240 Minutes Intravenous Every 8 hours 12/04/14 1510 12/19/14 1028   12/04/14 1415  vancomycin (VANCOCIN) 50 mg/mL oral solution 125 mg Status: Discontinued    125 mg Oral 4 times per day 12/04/14 1402 12/04/14 2346   12/04/14 1415  ciprofloxacin (CIPRO) IVPB 400 mg Status: Discontinued    400 mg 200 mL/hr over 60 Minutes Intravenous Every 12 hours 12/04/14 1402 12/04/14 1504       Assessment/Plan POD 14, s/p subtotal colectomy with ileostomy---Dr. Donne Hazel -continue soft diet -continue to mobilize -cont dressing changes -needs to be oob -Imodium started, but only BID since output is thickening up ID-Zosyn and fluconazole stopped DVT prophylaxis -heparin/SCDs Dispo-surgically stable for dc home, when ok with primary service   LOS: 15 days    Billijo Dilling E 12/19/2014, 11:31 AM Pager: [phone]       Cosigned by: Stark Klein, MD at 12/19/2014 4:45 PM

## 2014-12-20 NOTE — Discharge Summary (Signed)
Discharge Summary  Vincent Black:[id number] DOB: [birth year]  PCP: No PCP Per Patient  Admit date: 12/04/2014 Discharge date: 12/20/2014  Time spent: >65mins  Recommendations for Outpatient Follow-up:  1. F/u with PMD in two weeks, patient reported Dr Benson Norway had been his pmd, pmd to repeat lft in two weeks 2. F/u with general surgery Dr Melissa Montane in 2-3 weeks for postop followup.  Discharge Diagnoses:  Active Hospital Problems   Diagnosis Date Noted  . Abdominal pain 12/04/2014  . Pain of upper abdomen   . Anasarca 12/16/2014  . Acute renal failure 12/13/2014  . Perforated sigmoid colon   . Anemia, chronic disease   . Enteritis due to Clostridium difficile   . Fever   . Perforation of sigmoid colon   . C. difficile colitis   . Septic shock   . Toxic megacolon   . Small bowel fistula   . Sinus tachycardia   . Hypomagnesemia   . Metabolic acidosis   . Hyperglycemia   . Leukocytosis   . Chronic pain syndrome   . Anxiety state   . Pericolonic abscess 10/27/2014  . Unintentional weight loss 10/08/2014  . Normocytic anemia 10/08/2014  . Thrombocytosis 10/08/2014  . Protein-calorie malnutrition, severe 09/30/2014  . Crohn's disease 09/28/2014    Resolved Hospital Problems   Diagnosis Date Noted Date Resolved  . Encounter for nasogastric (NG) tube placement  12/13/2014  . Anemia of chronic disease  12/13/2014  . History of Clostridium difficile colitis 09/30/2014 12/13/2014    Discharge Condition: stable, to home with home health  Diet recommendation: heart healthy/carb modified, soft diet,   Filed Weights   12/04/14 2300 12/10/14 0600 12/12/14 2155  Weight: 66.9 kg (147 lb 7.8 oz) 77.2 kg (170 lb 3.1 oz) 77.3 kg (170 lb 6.7 oz)    History of present illness:  Vincent Black is 44 yo male with known history of Crohn's disease, several recent admissions in March 2016, discharged on March 1st, 2016 after being treated severe C. difficile colitis complicated by pericolonic  abscess requiring drain placement on 10/27/14 and positive for Citrobacter and Klebsiella, most recent discharge on 3/30 after being treated for severe sepsis secondary to intra-abd abscess. Vincent Black has seen Dr. Megan Salon for management of ABX on 11/21/2014, was advised to continue taking antibiotics ciprofloxacin and oral vancomycin until repeat CT abdomen done which was scheduled be done on 12/05/2014. Last CT abdomen was done on 11/17/2014 and showed abscess cavity collapsed. Patient saw Dr. Benson Norway his gastroenterologist earlier in a day for evaluation of sudden onset of hematemesis, generalized weakness, right upper quadrant area pain. Patient explains this started very suddenly, started to throw up massive amounts of gastric content with blood in it. Please note that patient is too weak to provide full history and father and patient's wife at bedside provided details. No reported fevers or chills, pain in the right upper and middle quadrants is constant and sharp, 10 /10 in severity, worse with eating, no specific alleviating factors, radiating to epigastric area. Patient describes pain similar to pain that he had with abscess last month. Patient is worried that this may be another abscess forming. Patient denies chest pain or shortness of breath but feels weak and tired.   Vincent Black arrived to 6N but on exam looks very pale and sick, HR in 140's and significant abd discomfort. Transfer to SDU STAT, requested CT abd STAT and started Zosyn IV. Surgery consulted and notified doctor in e-link.   Hospital Course:  Principal Problem:  Abdominal pain Active Problems:   Crohn's disease   Protein-calorie malnutrition, severe   Unintentional weight loss   Normocytic anemia   Thrombocytosis   Pericolonic abscess   Enteritis due to Clostridium difficile   Fever   Perforation of sigmoid colon   C. difficile colitis   Septic shock   Toxic megacolon   Small bowel fistula   Sinus tachycardia   Hypomagnesemia    Metabolic acidosis   Hyperglycemia   Leukocytosis   Chronic pain syndrome   Anxiety state   Perforated sigmoid colon   Anemia, chronic disease   Acute renal failure   Anasarca   Pain of upper abdomen  Septic shock secondary to Perforated sigmoid colon, perforated cecal toxic megacolon, and small bowel fistula in the setting of Crohn's disease  - s/p ex lap and SBR with anastomosis, total abdominal colectomy with end ileostomy (4/25 - Dr. Donne Hazel). - Status post 2 weeks of therapy with Zosyn/fluconazole as well as 10 days of flagyl. - Wound cultures positive for Candida albicans. Blood cultures negative. - Continue wet to dry dressing changes to open abdominal wound. -cleared by surgery for discharge and outpatient surgery follow up.  Active problems:  Severe deconditioning - Evaluated by CIR, not a candidate. -  Continue Vincent Black. OOB--->chair for meals, ambulate TID. Family decided to home with [facility].  Severe protein calorie malnutrition / unintentional weight loss - The patient has lost over 50 pounds in the past 6 months. Albumin < 1. Lower extremity pitting edema. - Continue nutritional supplements. - Continue Remeron for appetite stimulation.  C.diff colitis  - S/P 10 days of Flagyl. - Repeat C diff negative.   Sinus tachycardia - Improved with treatment of sepsis. -on low dose lopressor  Acute renal failure/Decreased uop - Prerenal in etiology with significant third spacing of fluid. - Creatinine improving.  Normocytic Anemia - chronic - Transfuse for Hgb < 7. Received 1 unit of blood 12/18/14 (2 units total). - Fecal occult blood negative 12/18/14.  Thrombocytosis  - Monitoring. Still elevated, likely reactive from recent sepsis.  Leukocytosis - Secondary to sepsis, WBC continues to improve.  Acute on chronic pain uncontrolled  - Continue Robaxin, MS Contin and OxyIR for breakthrough pain.  Anxiety -PRN Ativan.   Code Status: Full. Family Communication:  patient and Wife at the bedside.  Disposition Plan: home with Vincent Black/OT/RN.    Procedures: * right IJ placement 4/25, discontinued prior to discharge 4/25: 1. Ex lap 2. Small bowel resection with anastomosis 3. Total abdominal colectomy with end ileostomy  Consultations:  Meredith Staggers, MD, Physical Medicine and Rehabilitation  Carol Ada, MD, Gastroenterology  Simonne Maffucci, MD, PCCM  Rolm Bookbinder, MD, General Surgery  Antibiotics:  Zosyn 4/25 > 12/19/14  Flagyl 4/26 > 12/14/14  Fluconazole 4/27 > 12/19/14 (Received mycafungin PTA)  Discharge Exam: BP 127/97 mmHg  Pulse 98  Temp(Src) 98.3 F (36.8 C) (Oral)  Resp 18  Ht 5\' 9"  (1.753 m)  Wt 77.3 kg (170 lb 6.7 oz)  BMI 25.15 kg/m2  SpO2 97%  Gen: NAD, weak Cardiovascular: Mildly tachycardic, No M/R/G Respiratory: Lungs CTAB Gastrointestinal: Abdomen soft, dressings intact mid abdomen, ostomy right lower quadrant with brown stool present Extremities: 2+ pitting edema bilaterally  Discharge Instructions You were cared for by a hospitalist during your hospital stay. If you have any questions about your discharge medications or the care you received while you were in the hospital after you are discharged, you can call the unit and asked to  speak with the hospitalist on call if the hospitalist that took care of you is not available. Once you are discharged, your primary care physician will handle any further medical issues. Please note that NO REFILLS for any discharge medications will be authorized once you are discharged, as it is imperative that you return to your primary care physician (or establish a relationship with a primary care physician if you do not have one) for your aftercare needs so that they can reassess your need for medications and monitor your lab values.  Discharge Instructions    Diet - low sodium heart healthy    Complete by:  As directed      Increase activity slowly    Complete by:   As directed             Medication List    STOP taking these medications        ciprofloxacin 500 MG tablet  Commonly known as:  CIPRO     fluconazole 100 MG tablet  Commonly known as:  DIFLUCAN     metroNIDAZOLE 500 MG tablet  Commonly known as:  FLAGYL     vancomycin 50 mg/mL oral solution  Commonly known as:  VANCOCIN      TAKE these medications        acetaminophen 500 MG tablet  Commonly known as:  TYLENOL  Take 1 tablet (500 mg total) by mouth 2 (two) times daily.     ALPRAZolam 0.25 MG tablet  Commonly known as:  XANAX  Take 0.25 mg by mouth 3 (three) times daily as needed for anxiety.     hyoscyamine 0.375 MG 12 hr tablet  Commonly known as:  LEVBID  Take 0.375 mg by mouth 2 (two) times daily.     methocarbamol 500 MG tablet  Commonly known as:  ROBAXIN  Take 2 tablets (1,000 mg total) by mouth every 8 (eight) hours as needed for muscle spasms.     metoprolol tartrate 25 MG tablet  Commonly known as:  LOPRESSOR  Take 0.5 tablets (12.5 mg total) by mouth 2 (two) times daily.     mirtazapine 15 MG disintegrating tablet  Commonly known as:  REMERON SOL-TAB  Take 15 mg by mouth at bedtime.     morphine 15 MG 12 hr tablet  Commonly known as:  MS CONTIN  Take 1 tablet (15 mg total) by mouth every 12 (twelve) hours.     oxyCODONE 5 MG immediate release tablet  Commonly known as:  Oxy IR/ROXICODONE  Take 1 tablet (5 mg total) by mouth every 4 (four) hours as needed for moderate pain.     pantoprazole 40 MG tablet  Commonly known as:  PROTONIX  Take 1 tablet (40 mg total) by mouth daily.     prednisoLONE acetate 1 % ophthalmic suspension  Commonly known as:  PRED FORTE  Place 1 drop into both eyes 2 (two) times daily. Place 1 drop in LEFT eye once daily, place 1 drop in RIGHT eye twice daily     PROBIOTIC PO  Take 1 tablet by mouth daily. Chewable     promethazine 6.25 MG/5ML syrup  Commonly known as:  PHENERGAN  Take 12.5 to 25 mg every 6 hours as  needed for nausea.     traMADol 50 MG tablet  Commonly known as:  ULTRAM  Take 50 mg by mouth every 6 (six) hours.       No Known Allergies     Follow-up Information  Follow up with [facility].   Specialty:  Home Health Services   Why:  [facility] RN Vincent Black Aide dressing changes   Contact information:   [address] [place] [address] [phone]       Follow up with Rolm Bookbinder, MD. Schedule an appointment as soon as possible for a visit in 2 weeks.   Specialty:  General Surgery   Why:  For post-operation check with Dr. Donne Hazel, call our office to confirm time/date.   Contact information:   [address] [place] [address] [phone]       Follow up with HUNG,PATRICK D, MD In 2 weeks.   Specialty:  Gastroenterology   Contact information:   [address] [place] [address] [phone]        The results of significant diagnostics from this hospitalization (including imaging, microbiology, ancillary and laboratory) are listed below for reference.    Significant Diagnostic Studies: Dg Chest 1 View  12/04/2014   CLINICAL DATA:  Fever. Nasogastric tube placement. Crohn disease. Anxiety.  EXAM: CHEST  1 VIEW  COMPARISON:  CT of 11/17/2014  FINDINGS: Nasogastric terminates at the body of the stomach. Suspect remote anterior right rib trauma including at the second and possibly the third anterior right ribs. Midline trachea. Normal heart size. No pleural fluid. Low lung volumes. Patchy bibasilar atelectasis. Free intraperitoneal air under both hemidiaphragms.  IMPRESSION: 1. Appropriate position of nasogastric tube. 2. Extensive free intraperitoneal air. Critical test results telephoned toMaggie, r.n. at the time of interpretation at 4:40 p.m.on . 12/04/2014.   Electronically Signed   By: Abigail Miyamoto M.D.   On: 12/04/2014 16:38   Ir Sinus/fist Tube Chk-non Gi  11/28/2014   CLINICAL DATA:  History of Crohn's  disease and colonic perforation with development of an intraperitoneal abscess.  Patient underwent CT-guided percutaneous drainage catheter placement on 10/27/2014 with subsequent CT scan performed 11/17/2014 demonstrating near complete resolution of the abdominal abscess. Contrast injection of the percutaneous drain also performed 11/17/2014 demonstrated a persistent fistulous connection with the adjacent bowel.  Patient returns to the Interventional Radiology Clinic for repeat fluoroscopic guided percutaneous drainage catheter injection. The patient reports minimal to no output from the percutaneous drainage catheter. The patient has not been flushing the percutaneous drain.  EXAM: SINUS TRACT INJECTION/FISTULOGRAM  COMPARISON:  CT abdomen and pelvis- 11/17/2014; 11/03/2014; 10/26/2014; CT-guided percutaneous drainage catheter placement -10/27/2014 ; fluoroscopic guided percutaneous drainage catheter injection- 11/17/2014  CONTRAST:  10 mL OMNIPAQUE IOHEXOL 300 MG/ML SOLN - injected via the existing left lower quadrant percutaneous drainage catheter.  FLUOROSCOPY TIME:  1 minute, 18 seconds (33.8 mGy).  TECHNIQUE: Patient was placed supine on the fluoroscopy table.  A preprocedural spot fluoroscopic image was obtained of the left lower abdominal quadrant and existing percutaneous drainage catheter.  Multiple spot fluoroscopic and radiographic images were obtained in various obliquities following the injection of a small amount of contrast via the percutaneous drainage catheter.  Images were reviewed and the procedure was terminated. The drainage catheter was flushed with a small amount of saline and reconnected to a gravity bag. A dressing was placed. The patient tolerated the procedure well without immediate postprocedural complication.  FINDINGS: Preprocedural spot fluoroscopic image demonstrates unchanged positioning of the percutaneous drainage catheter overlying left lower abdominal quadrant.  Contrast  injection confirms a persistent fistulous connection with an adjacent loop of small bowel.  As such, the drainage catheter was reconnected to a gravity bag.  IMPRESSION: Persistent fistulous connection from the percutaneous drainage catheter to an adjacent loop of small bowel.  PLAN: Patient will return to the interventional radiology drain Clinic on 12/12/2014 for repeat fluoroscopic guided percutaneous drainage catheter injection. If a fistulous connection persists, surgical revision may have to be considered.   Electronically Signed   By: Sandi Mariscal M.D.   On: 11/28/2014 14:25   Dg Chest Port 1 View  12/07/2014   CLINICAL DATA:  Pulmonary edema.  EXAM: PORTABLE CHEST - 1 VIEW  COMPARISON:  12/06/2014  FINDINGS: Low lung volumes with bibasilar atelectasis and vascular congestion, similar to prior study. Lucency under the right hemidiaphragm again noted, possibly pneumoperitoneum. NG tube and right central line remain in place, unchanged.  IMPRESSION: Low lung volumes with bibasilar atelectasis.  Lucency under the right hemidiaphragm again noted, possibly pneumoperitoneum related to recent surgery. May consider decubitus view of the abdomen to assess degree of pneumoperitoneum if felt clinically indicated.   Electronically Signed   By: Rolm Baptise M.D.   On: 12/07/2014 08:50   Dg Chest Port 1 View  12/06/2014   CLINICAL DATA:  Edema  EXAM: PORTABLE CHEST - 1 VIEW  COMPARISON:  12/04/2014  FINDINGS: Low lung volumes with vascular crowding. Possible mild interstitial edema with bibasilar atelectasis. No pneumothorax.  The heart is normal in size.  Right IJ venous catheter terminates at the cavoatrial junction.  Enteric tube traverses in the distal gastric antrum.  Lucency beneath the right hemidiaphragm could suggest free air related to recent surgery.  IMPRESSION: Low lung volumes with vascular crowding and possible mild interstitial edema.  Bibasilar atelectasis.  Support apparatus as above.    Electronically Signed   By: Julian Hy M.D.   On: 12/06/2014 09:45   Dg Chest Port 1 View  12/04/2014   CLINICAL DATA:  Central line and NG tube placement.  EXAM: PORTABLE CHEST - 1 VIEW  COMPARISON:  Earlier same day  FINDINGS: Grossly unchanged cardiac silhouette and mediastinal contours. Interval placement of a right jugular approach intravenous catheter with tip projected of the superior cavoatrial junction. Enteric tube tip and side port projects over the expected location of the gastric antrum. No pneumothorax. Apparent resolution of previously noted pneumoperitoneum. Improved aeration of lung bases without focal airspace opacity. No pleural effusion or pneumothorax. Unchanged bones.  IMPRESSION: 1. Appropriately positioned support apparatus as above. No pneumothorax. 2. Apparent resolution of previously noted large amount of pneumoperitoneum. 3. Improved aeration lungs with resolved bibasilar atelectasis.   Electronically Signed   By: Sandi Mariscal M.D.   On: 12/04/2014 21:19   Dg Abd Portable 1v  12/07/2014   CLINICAL DATA:  Nasogastric tube placement.  Initial encounter.  EXAM: PORTABLE ABDOMEN - 1 VIEW  COMPARISON:  Abdominal radiograph performed 12/04/2014  FINDINGS: The patient's enteric tube is noted ending overlying the body of the stomach.  The visualized bowel gas pattern is unremarkable. A bowel suture line is noted at the left lower quadrant. An ostomy is seen at the right lower quadrant. Scattered air and stool filled loops of colon are seen; no abnormal dilatation of small bowel loops is seen to suggest small bowel obstruction. No free intra-abdominal air is identified, though evaluation for free air is limited on a single supine view.  The visualized osseous structures are within normal limits; the sacroiliac joints are unremarkable in appearance.  A drainage catheter is noted overlying the upper pelvis.  IMPRESSION: 1. Enteric tube noted ending overlying the body of the  stomach. 2.  Unremarkable bowel gas pattern; postoperative change at the lower quadrants. No free intra-abdominal air seen.   Electronically Signed   By: Garald Balding M.D.   On: 12/07/2014 06:32   Dg Abd Portable 1v  12/04/2014   CLINICAL DATA:  Nasogastric tube placement.  EXAM: PORTABLE ABDOMEN - 1 VIEW  COMPARISON:  Abdominal CT 11/17/2014  FINDINGS: There is large pneumoperitoneum with Rigler's sign and outlining of the falciform ligament. The colon is distended with high-density material, likely retained contrast from comparison CT. Small bowel dilatation.  Nasogastric tube is in good position. There is a left lower quadrant percutaneous abscess drain.  Critical Value/emergent results were called by telephone at the time of interpretation on 12/04/2014 at 4:50 pm to Dr. [name] , who verbally acknowledged these results.  IMPRESSION: 1. Large pneumoperitoneum consistent with bowel perforation. 2. Small bowel and colonic distention which could be from low colonic obstruction or ileus. 3. Nasogastric tube is in good position. 4. Left lower quadrant percutaneous abscess drain.   Electronically Signed   By: Monte Fantasia M.D.   On: 12/04/2014 16:51    Microbiology: Recent Results (from the past 240 hour(s))  Clostridium Difficile by PCR     Status: Abnormal   Collection Time: 12/12/14 10:19 AM  Result Value Ref Range Status   C difficile by pcr POSITIVE (A) NEGATIVE Final    Comment: CRITICAL RESULT CALLED TO, READ BACK BY AND VERIFIED WITH: Bethel Born RN 12:00 12/12/14 ([email or web address])   Clostridium Difficile by PCR     Status: None   Collection Time: 12/14/14  3:14 PM  Result Value Ref Range Status   C difficile by pcr  NEGATIVE Final    TEST PERFORMED ONCE PER 72 HOURS/CREDITED PENDING REORDER 12/15/14     Labs: Basic Metabolic Panel:  Recent Labs Lab 12/14/14 0505 12/15/14 0305 12/16/14 0515 12/17/14 0543 12/18/14 0508 12/19/14 0430 12/20/14 0520  NA 142 143 138 139  --  138 137  K 5.7*  4.8 4.9 4.0  --  3.9 3.6  CL 115* 115* 110 109  --  105 104  CO2 20* 20* 19* 20*  --  23 25  GLUCOSE 66* 80 77 86  --  73 95  BUN 17 17 20 20   --  20 16  CREATININE 1.65* 1.63* 1.69* 1.63*  --  1.53* 1.45*  CALCIUM 7.1* 7.2* 6.9* 7.3*  --  7.3* 7.1*  MG 1.5* 1.4* 1.3* 1.6* 1.8  --   --   PHOS 3.2 3.4 3.4 3.4 3.4  --   --    Liver Function Tests:  Recent Labs Lab 12/14/14 0505 12/15/14 0305 12/16/14 0515 12/17/14 0543  AST 34 20 35 16  ALT 11* 11* 11* 9*  ALKPHOS 749* 651* 540* 449*  BILITOT 0.6 0.3 0.7 0.4  PROT 4.7* 5.0* 4.9* 5.0*  ALBUMIN <1.0* <1.0* <1.0* <1.0*   No results for input(s): LIPASE, AMYLASE in the last 168 hours. No results for input(s): AMMONIA in the last 168 hours. CBC:  Recent Labs Lab 12/15/14 0305 12/16/14 0515 12/16/14 0850 12/17/14 0543 12/18/14 0508 12/19/14 0430 12/20/14 0520  WBC 21.5* 32.8* 22.8* 19.2* 14.0* 11.7* 11.0*  NEUTROABS 17.9* 26.9* 18.6* 15.7* 10.7*  --   --   HGB 6.3* 3.8* 7.6* 7.4* 6.6* 7.7* 7.4*  HCT 21.5* 11.9* 24.4* 23.5* 21.0* 24.1* 23.6*  MCV 92.7 90.8 89.7 89.4 88.6 87.6 88.4  PLT 507* 586* 505* 551* 571* 575* 559*  Cardiac Enzymes: No results for input(s): CKTOTAL, CKMB, CKMBINDEX, TROPONINI in the last 168 hours. BNP: BNP (last 3 results) No results for input(s): BNP in the last 8760 hours.  ProBNP (last 3 results) No results for input(s): PROBNP in the last 8760 hours.  CBG:  Recent Labs Lab 12/19/14 0757 12/19/14 1203 12/19/14 1717 12/19/14 2211 12/20/14 0817  GLUCAP 92 92 77 82 101*       Signed:  Lathen Seal MD, PhD  [facility] 12/20/2014, 11:53 AM

## 2015-06-16 IMAGING — CR DG CHEST 1V
1 series · 1 of 1 positions shown · non-contrast
Comparison: CT of 11/17/2014

CLINICAL DATA: Fever. Nasogastric tube placement. Crohn disease.
Anxiety.

EXAM:
CHEST  1 VIEW

[AP]
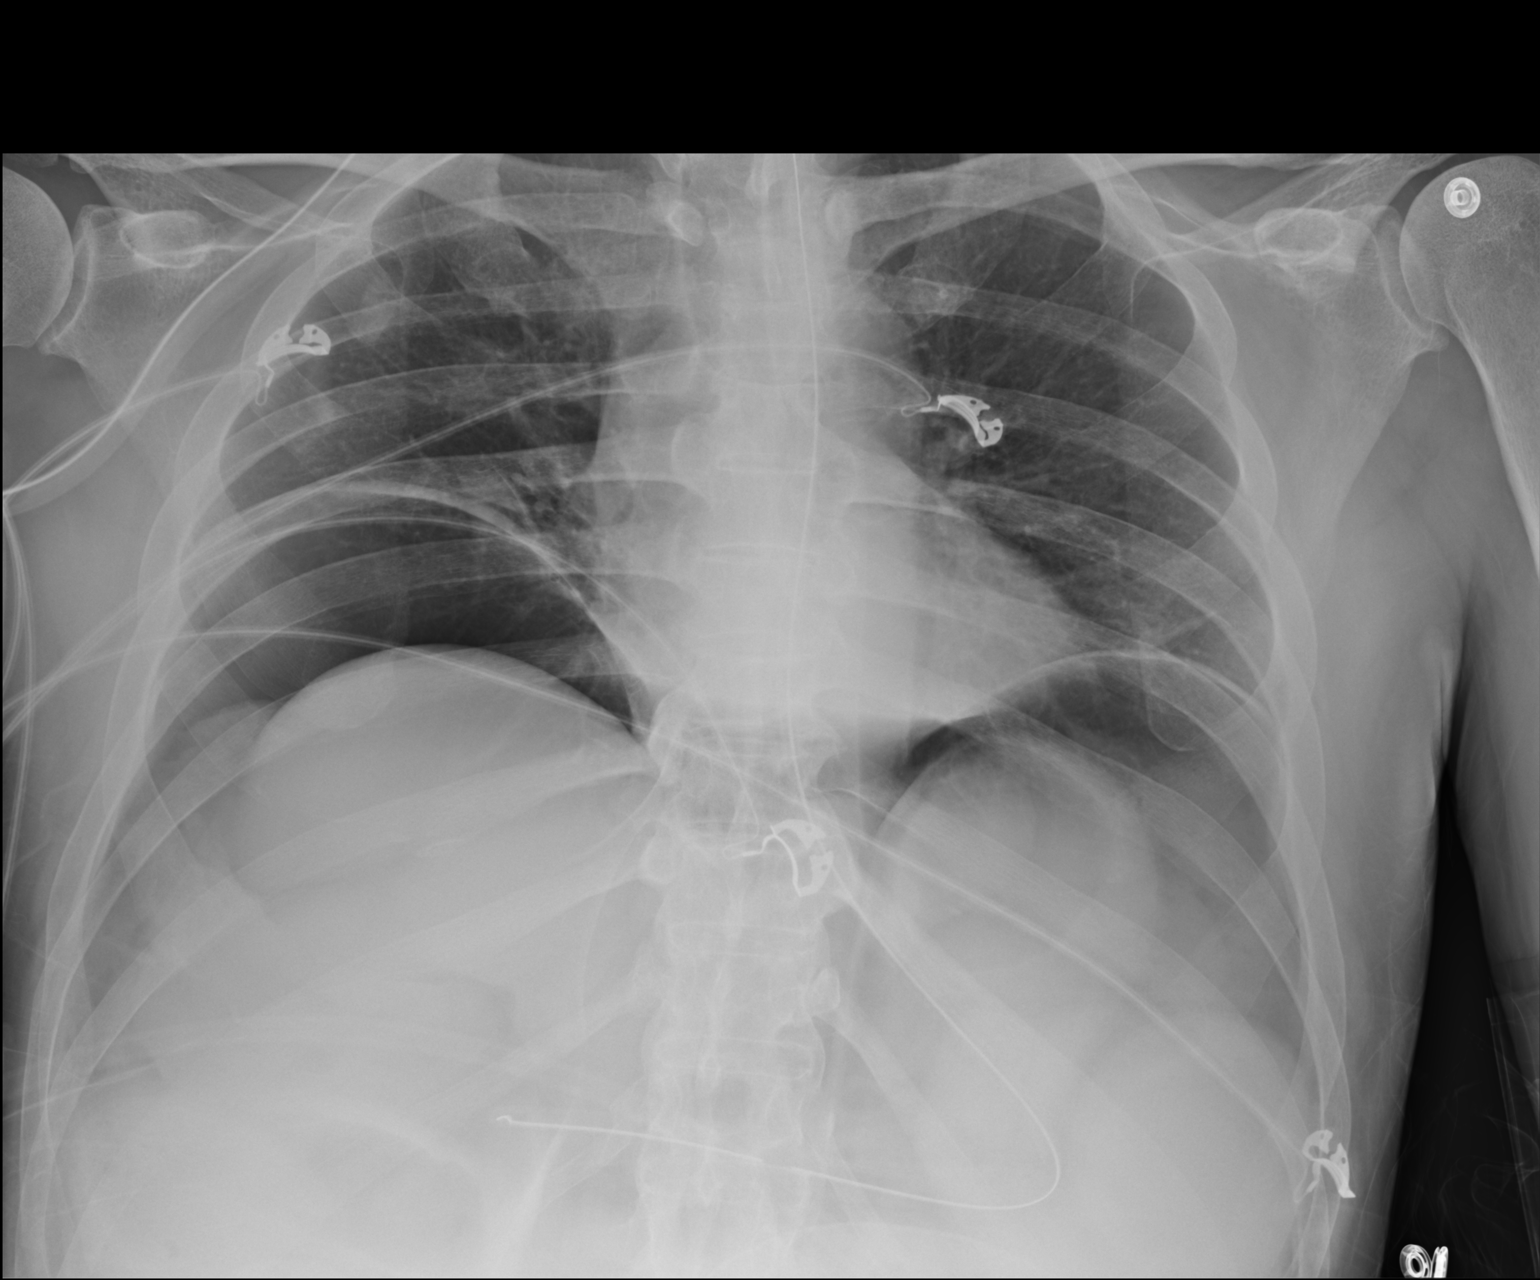

[1 of 1 positions shown; findings below may reference images not displayed]

FINDINGS: Nasogastric terminates at the body of the stomach. Suspect remote
anterior right rib trauma including at the second and possibly the
third anterior right ribs. Midline trachea. Normal heart size. No
pleural fluid. Low lung volumes. Patchy bibasilar atelectasis. Free
intraperitoneal air under both hemidiaphragms.
IMPRESSION: 1. Appropriate position of nasogastric tube.
2. Extensive free intraperitoneal air. Critical test results
telephoned toMaggie, r.n... at the time of interpretation at [DATE]
p.m....on . 12/04/2014...

## 2015-06-16 IMAGING — CR DG CHEST 1V PORT
1 series · 1 of 1 positions shown · non-contrast
Comparison: Earlier same day

CLINICAL DATA: Central line and NG tube placement.

EXAM:
PORTABLE CHEST - 1 VIEW

[AP]
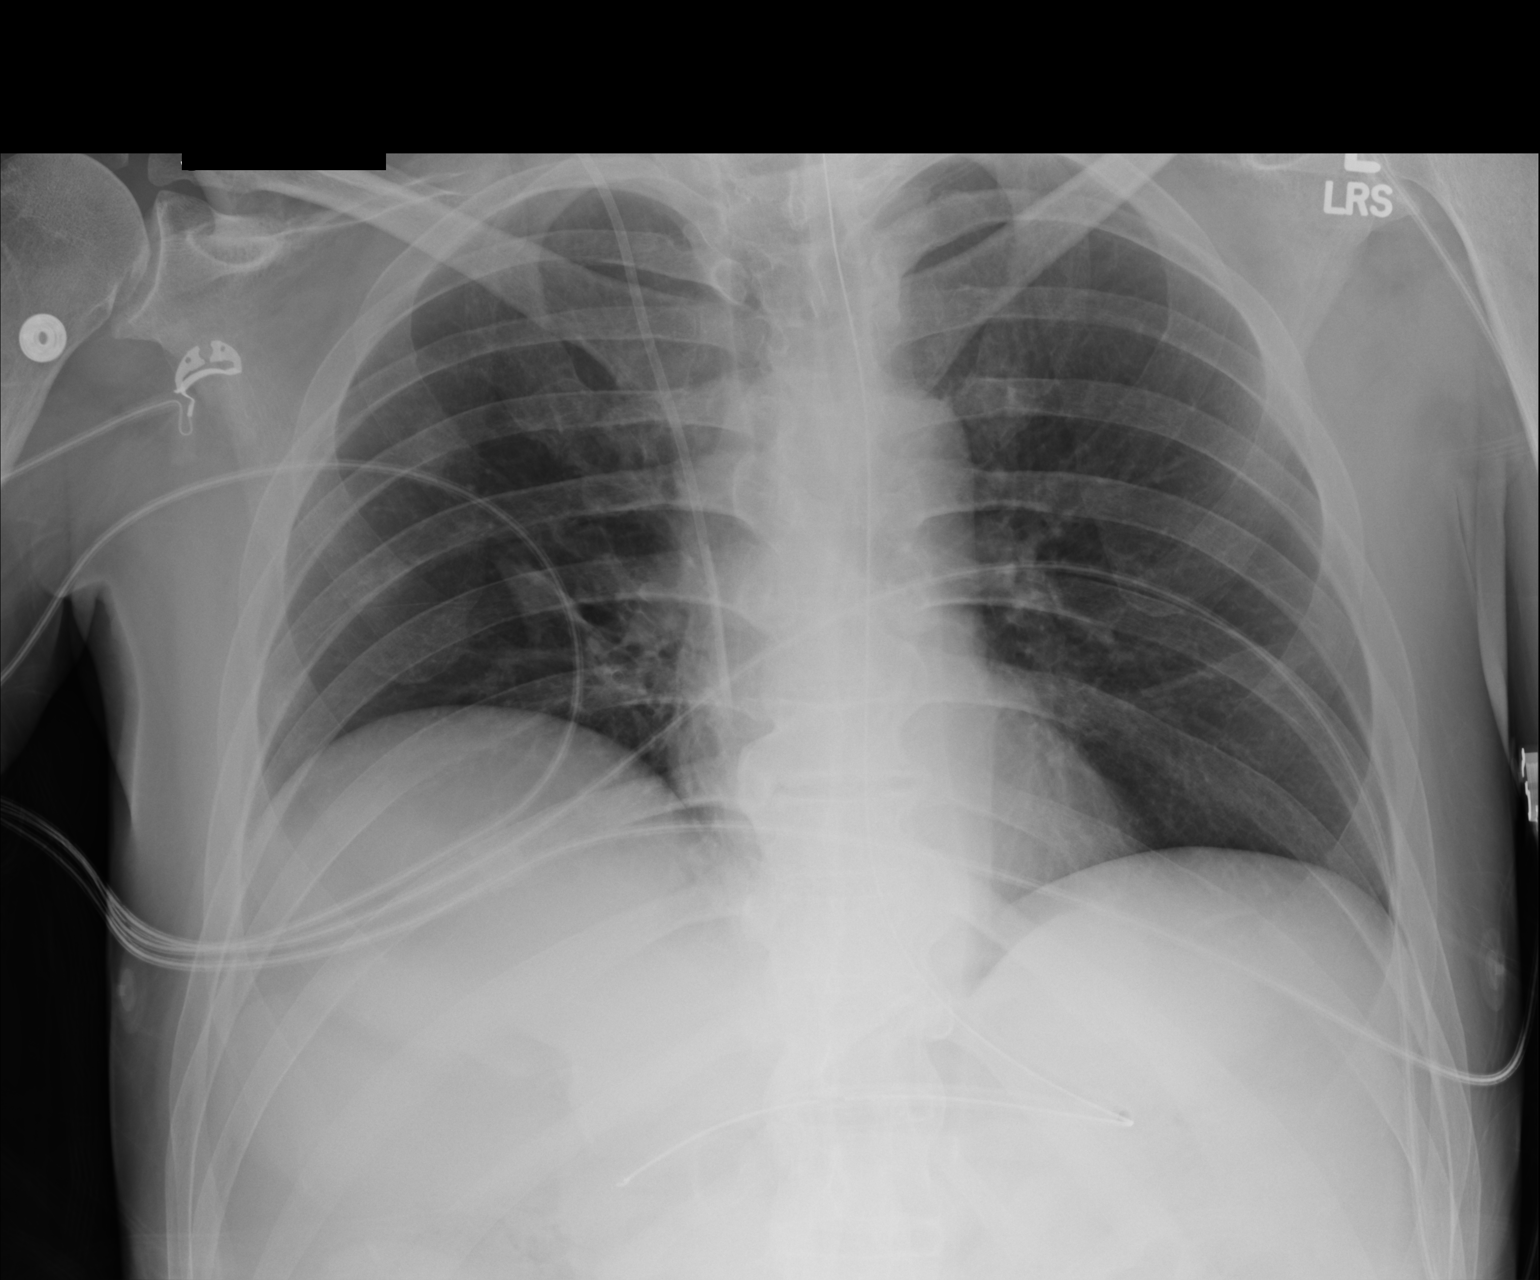

[1 of 1 positions shown; findings below may reference images not displayed]

FINDINGS: Grossly unchanged cardiac silhouette and mediastinal contours.
Interval placement of a right jugular approach intravenous catheter
with tip projected of the superior cavoatrial junction. Enteric tube
tip and side port projects over the expected location of the gastric
antrum. No pneumothorax. Apparent resolution of previously noted
pneumoperitoneum. Improved aeration of lung bases without focal
airspace opacity. No pleural effusion or pneumothorax. Unchanged
bones.
IMPRESSION: 1. Appropriately positioned support apparatus as above. No
pneumothorax.
2. Apparent resolution of previously noted large amount of
pneumoperitoneum.
3. Improved aeration lungs with resolved bibasilar atelectasis.

## 2015-06-18 IMAGING — CR DG CHEST 1V PORT
1 series · 1 of 1 positions shown · non-contrast
Comparison: 12/04/2014

CLINICAL DATA: Edema

EXAM:
PORTABLE CHEST - 1 VIEW

[AP]
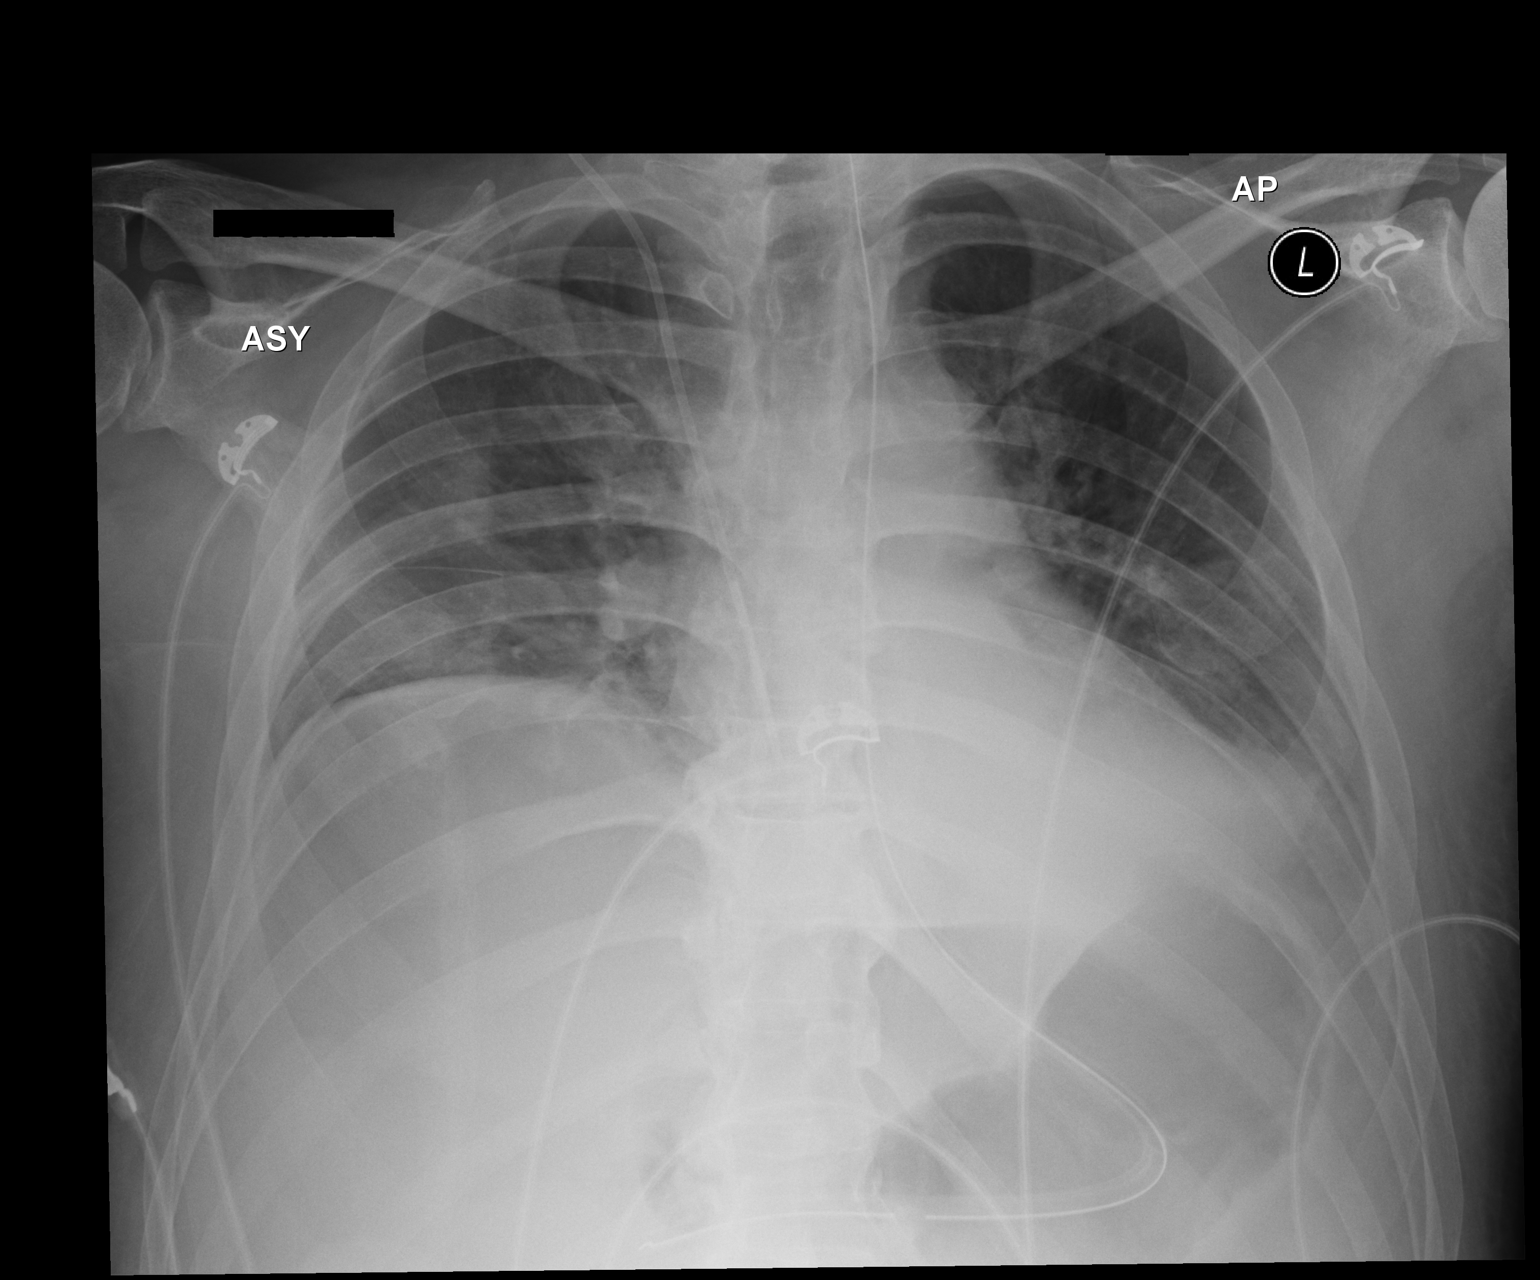

[1 of 1 positions shown; findings below may reference images not displayed]

FINDINGS: Low lung volumes with vascular crowding. Possible mild interstitial
edema with bibasilar atelectasis. No pneumothorax.

The heart is normal in size.

Right IJ venous catheter terminates at the cavoatrial junction.

Enteric tube traverses in the distal gastric antrum.

Lucency beneath the right hemidiaphragm could suggest free air
related to recent surgery.
IMPRESSION: Low lung volumes with vascular crowding and possible mild
interstitial edema.

Bibasilar atelectasis.

Support apparatus as above.

## 2015-06-19 IMAGING — CR DG ABD PORTABLE 1V
1 series · 1 of 1 positions shown · non-contrast
Comparison: Abdominal radiograph performed 12/04/2014

CLINICAL DATA: Nasogastric tube placement.  Initial encounter.

EXAM:
PORTABLE ABDOMEN - 1 VIEW

[AP]
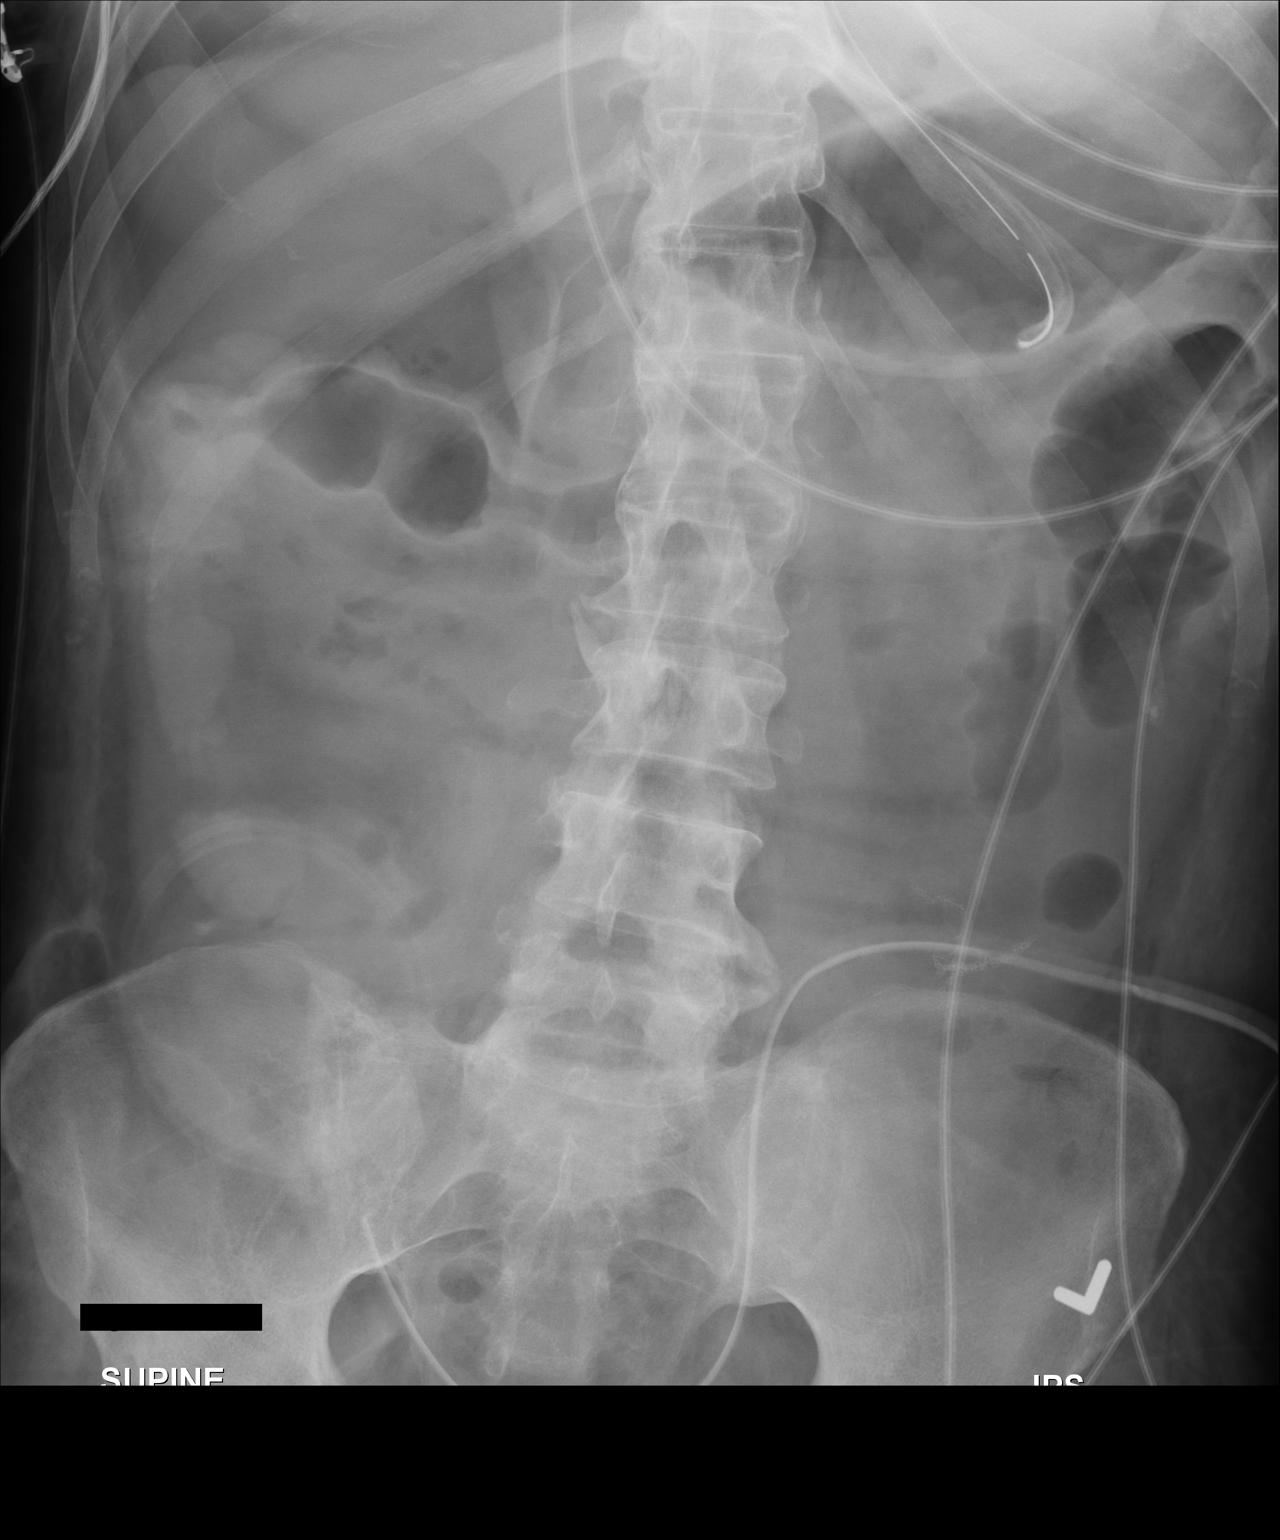

[1 of 1 positions shown; findings below may reference images not displayed]

FINDINGS: The patient's enteric tube is noted ending overlying the body of the
stomach.

The visualized bowel gas pattern is unremarkable. A bowel suture
line is noted at the left lower quadrant. An ostomy is seen at the
right lower quadrant. Scattered air and stool filled loops of colon
are seen; no abnormal dilatation of small bowel loops is seen to
suggest small bowel obstruction. No free intra-abdominal air is
identified, though evaluation for free air is limited on a single
supine view.

The visualized osseous structures are within normal limits; the
sacroiliac joints are unremarkable in appearance.

A drainage catheter is noted overlying the upper pelvis.
IMPRESSION: 1. Enteric tube noted ending overlying the body of the stomach.
2. Unremarkable bowel gas pattern; postoperative change at the lower
quadrants. No free intra-abdominal air seen.

## 2015-06-19 IMAGING — CR DG CHEST 1V PORT
1 series · 1 of 1 positions shown · non-contrast
Comparison: 12/06/2014

CLINICAL DATA: Pulmonary edema.

EXAM:
PORTABLE CHEST - 1 VIEW

[AP]
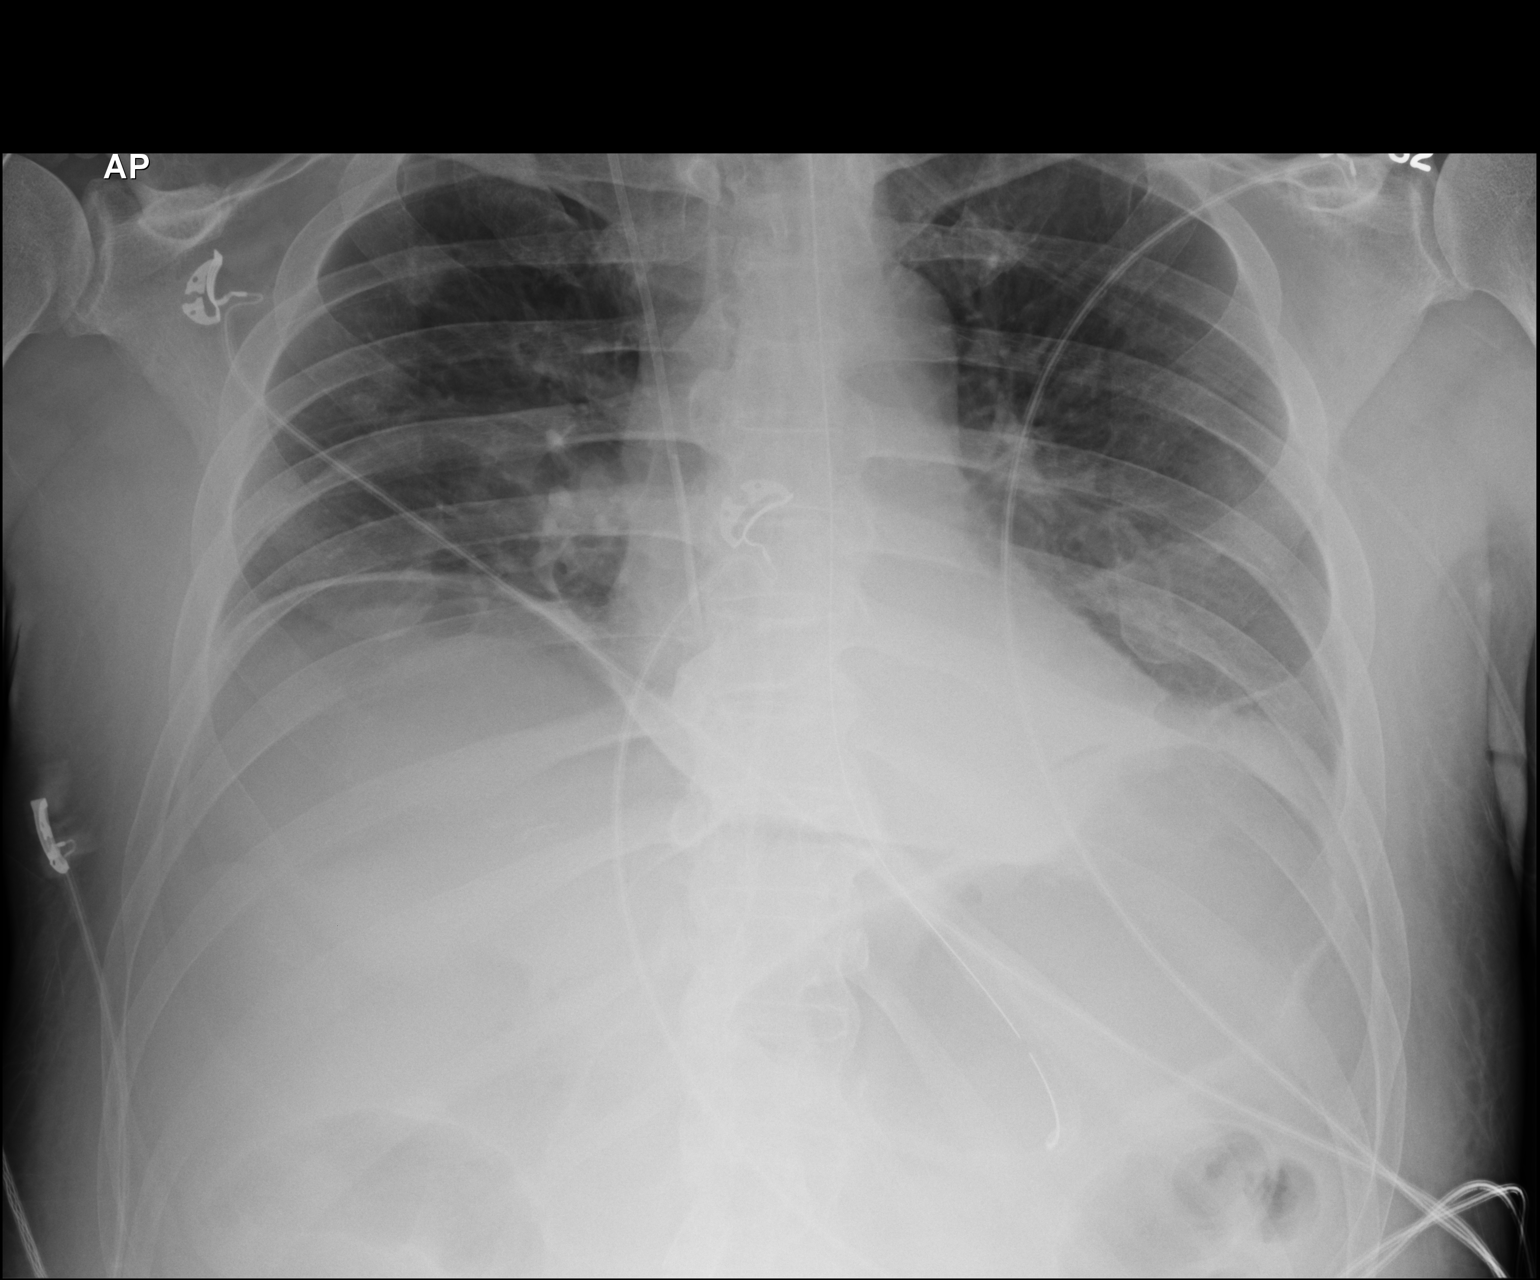

[1 of 1 positions shown; findings below may reference images not displayed]

FINDINGS: Low lung volumes with bibasilar atelectasis and vascular congestion,
similar to prior study. Lucency under the right hemidiaphragm again
noted, possibly pneumoperitoneum. NG tube and right central line
remain in place, unchanged.
IMPRESSION: Low lung volumes with bibasilar atelectasis.

Lucency under the right hemidiaphragm again noted, possibly
pneumoperitoneum related to recent surgery. May consider decubitus
view of the abdomen to assess degree of pneumoperitoneum if felt
clinically indicated.

## 2017-11-25 ENCOUNTER — Ambulatory Visit: Payer: 59 | Admitting: Physician Assistant

## 2017-11-30 ENCOUNTER — Ambulatory Visit (INDEPENDENT_AMBULATORY_CARE_PROVIDER_SITE_OTHER): Payer: Medicare PPO | Admitting: Physician Assistant

## 2017-11-30 ENCOUNTER — Encounter: Payer: Self-pay | Admitting: Physician Assistant

## 2017-11-30 VITALS — BP 133/92 | HR 62 | Ht 69.0 in | Wt 226.0 lb

## 2017-11-30 DIAGNOSIS — Z7689 Persons encountering health services in other specified circumstances: Secondary | ICD-10-CM

## 2017-11-30 DIAGNOSIS — I10 Essential (primary) hypertension: Secondary | ICD-10-CM

## 2017-11-30 DIAGNOSIS — F419 Anxiety disorder, unspecified: Secondary | ICD-10-CM

## 2017-11-30 DIAGNOSIS — F4010 Social phobia, unspecified: Secondary | ICD-10-CM | POA: Diagnosis not present

## 2017-11-30 DIAGNOSIS — F5105 Insomnia due to other mental disorder: Secondary | ICD-10-CM

## 2017-11-30 MED ORDER — HYDROXYZINE HCL 25 MG PO TABS: 12.5000 mg | ORAL_TABLET | Freq: Every evening | ORAL | 0 refills | Status: DC | PRN

## 2017-11-30 MED ORDER — ALPRAZOLAM 0.25 MG PO TABS: 0.2500 mg | ORAL_TABLET | Freq: Three times a day (TID) | ORAL | 3 refills | Status: DC | PRN

## 2017-11-30 NOTE — Patient Instructions (Addendum)
For sleep: - Hydroxyzine 12.5 mg at bedtime as needed for sleep. May increase to 25 mg - 50 mg if needed - Do not use Xanax for sleep - Avoid alcohol and other sedatives, especially close to bedtime - Practice sleep hygiene  For your blood pressure: - Goal <130/80 - continue your Metoprolol twice a day - monitor and log blood pressures at home - check around the same time each day in a relaxed setting - Limit salt to <2000 mg/day - Follow DASH eating plan - limit alcohol to 2 standard drinks per day for men and 1 per day for women - avoid tobacco products - weight loss: 7% of current body weight - follow-up at least every 6 months for your blood pressure    Sleep Hygiene . Limiting daytime naps to 30 minutes . Napping does not make up for inadequate nighttime sleep. However, a short nap of 20-30 minutes can help to improve mood, alertness and performance.  . Avoiding stimulants such as  caffeine and nicotine close to bedtime.  And when it comes to alcohol, moderation is key 4. While alcohol is well-known to help you fall asleep faster, too much close to bedtime can disrupt sleep in the second half of the night as the body begins to process the alcohol.    . Exercising to promote good quality sleep.  As little as 10 minutes of aerobic exercise, such as walking or cycling, can drastically improve nighttime sleep quality.  For the best night's sleep, most people should avoid strenuous workouts close to bedtime. However, the effect of intense nighttime exercise on sleep differs from person to person, so find out what works best for you.   . Steering clear of food that can be disruptive right before sleep.   Heavy or rich foods, fatty or fried meals, spicy dishes, citrus fruits, and carbonated drinks can trigger indigestion for some people. When this occurs close to bedtime, it can lead to painful heartburn that disrupts sleep. . Ensuring adequate exposure to natural light.  This is particularly  important for individuals who may not venture outside frequently. Exposure to sunlight during the day, as well as darkness at night, helps to maintain a healthy sleep-wake cycle . Marland Kitchen Establishing a regular relaxing bedtime routine.  A regular nightly routine helps the body recognize that it is bedtime. This could include taking warm shower or bath, reading a book, or light stretches. When possible, try to avoid emotionally upsetting conversations and activities before attempting to sleep. . Making sure that the sleep environment is pleasant.  Mattress and pillows should be comfortable. The bedroom should be cool - between 60 and 67 degrees - for optimal sleep. Bright light from lamps, cell phone and TV screens can make it difficult to fall asleep4, so turn those light off or adjust them when possible. Consider using blackout curtains, eye shades, ear plugs, "white noise" machines, humidifiers, fans and other devices that can make the bedroom more relaxing. . Meditation. YouTube [address]. There are many smartphone apps as well

## 2017-11-30 NOTE — Progress Notes (Signed)
HPI:                                                                Vincent Black is a 47 y.o. male who presents to [facility] today to establish care  Current concerns include: anxiety   Anxiety: reports long-standing history of social anxiety. Takes xanax prn. Reports feeling more anxious and on edge lately. Reports he is a light sleeper, reports multiple nighttime awakenings. waking up half the time due to pain. Denies symptoms of mania/hypomania. Denies suicidal thinking. Denies auditory/visual hallucinations. Prior meds: Vertigo with Trazodone OTC Zquil seems to trigger anxiety    HTN: taking low-dose Metoprolol twice a day. Compliant with medications. Does not check BP's at home. Denies vision change, headache, chest pain with exertion, orthopnea, lightheadedness, syncope and edema. Risk factors include: male sex, obesity  Depression screen [facility] 2/9 11/30/2017 11/21/2014 11/15/2014 11/15/2014  Decreased Interest 1 1 1  0  Down, Depressed, Hopeless 1 1 1 1   PHQ - 2 Score 2 2 2 1   Altered sleeping 2 1 1  -  Tired, decreased energy 1 1 1  -  Change in appetite 0 3 3 -  Feeling bad or failure about yourself  0 0 0 -  Trouble concentrating 0 2 2 -  Moving slowly or fidgety/restless 0 1 3 -  Suicidal thoughts 0 0 0 -  PHQ-9 Score 5 10 12  -  Difficult doing work/chores - Extremely dIfficult Extremely dIfficult -    GAD 7 : Generalized Anxiety Score 11/30/2017  Nervous, Anxious, on Edge 3  Control/stop worrying 1  Worry too much - different things 1  Trouble relaxing 1  Restless 0  Easily annoyed or irritable 1  Afraid - awful might happen 0  Total GAD 7 Score 7      Past Medical History:  Diagnosis Date  . Ankylosing spondylitis ([place]) 09/30/2014  . Anxiety   . C. difficile colitis 09/30/2014  . Crohn disease ([place])   . Depression   . Hypertension    Past Surgical History:  Procedure Laterality Date  . FLEXIBLE SIGMOIDOSCOPY  N/A 09/29/2014   Procedure: FLEXIBLE SIGMOIDOSCOPY;  Surgeon: Beryle Beams, MD;  Location: [facility] ENDOSCOPY;  Service: Endoscopy;  Laterality: N/A;  . LAPAROTOMY N/A 12/04/2014   Procedure: EXPLORATORY LAPAROTOMY, Total Colectomy;  Surgeon: Rolm Bookbinder, MD;  Location: [facility];  Service: General;  Laterality: N/A;   Social History   Tobacco Use  . Smoking status: Former Smoker    Packs/day: 0.50    Years: 10.00    Pack years: 5.00    Types: Cigarettes  . Smokeless tobacco: Never Used  . Tobacco comment: 'quit smoking in ~ 2014"  Substance Use Topics  . Alcohol use: Yes    Comment: 10/25/2014 "might have a drink a couple times/yr"   family history is not on file.    ROS: negative except as noted in the HPI  Medications: Current Outpatient Medications  Medication Sig Dispense Refill  . ALPRAZolam (XANAX) 0.25 MG tablet Take 0.25 mg by mouth 3 (three) times daily as needed for anxiety.    Marland Kitchen azaTHIOprine (IMURAN) 50 MG tablet Take 5 tablets by mouth daily.  5  . metoprolol tartrate (LOPRESSOR) 25 MG  tablet Take 0.5 tablets (12.5 mg total) by mouth 2 (two) times daily. 60 tablet 0   No current facility-administered medications for this visit.    No Known Allergies     Objective:  BP (!) 133/92   Pulse 62   Ht 5\' 9"  (1.753 m)   Wt 226 lb (102.5 kg)   BMI 33.37 kg/m  Gen:  alert, not ill-appearing, no distress, appropriate for age, obese male HEENT: head normocephalic without obvious abnormality, conjunctiva and cornea clear, trachea midline Pulm: Normal work of breathing, normal phonation, clear to auscultation bilaterally, no wheezes, rales or rhonchi CV: Normal rate, regular rhythm, s1 and s2 distinct, no murmurs, clicks or rubs  GI: right-sided colostomy, well-healed laparotomy incisions Neuro: alert and oriented x 3, no tremor MSK: extremities atraumatic, normal gait and station Skin: intact, no rashes on exposed skin, no jaundice, no cyanosis Psych: well-groomed,  cooperative, good eye contact, euthymic mood, appears anxious, affect full range, speech is articulate, and thought processes clear and goal-directed    No results found for this or any previous visit (from the past 72 hour(s)). No results found.    Assessment and Plan: 47 y.o. male with   1. Encounter to establish care - Personally reviewed PMH, PSH, PFH, medications, allergies, HM - Age-appropriate cancer screening: n/a - Influenza n/a - Tdap declines - Candidate for Pneumovax, declines today - PHQ2 negative   2. Social anxiety disorder - PHQ2=2, GAD7=7, mild-moderate, no acute safety issues. 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 Hypertension goal BP (blood pressure) < 130/80 BP Readings from Last 3 Encounters:  11/30/17 (!) 133/92  12/20/14 (!) 140/94  11/21/14 (!) 124/95  - BP out of range - he is on a very low dose beta blocker, I suspect is more for anxiety and hx of tachycardia rather than hypertension - patient to monitor BP's at home - counseled on therapeutic lifestyle changes  Patient education and anticipatory guidance given Patient agrees with treatment plan Follow-up in 1 month for HTN, anxiety and insomnia as needed if symptoms worsen or fail to improve  Darlyne Russian PA-C

## 2017-12-03 DIAGNOSIS — F419 Anxiety disorder, unspecified: Secondary | ICD-10-CM | POA: Insufficient documentation

## 2017-12-03 DIAGNOSIS — I10 Essential (primary) hypertension: Secondary | ICD-10-CM | POA: Insufficient documentation

## 2017-12-03 DIAGNOSIS — D84821 Immunodeficiency due to drugs: Secondary | ICD-10-CM | POA: Insufficient documentation

## 2017-12-03 DIAGNOSIS — Z79899 Other long term (current) drug therapy: Secondary | ICD-10-CM | POA: Insufficient documentation

## 2017-12-03 DIAGNOSIS — F5105 Insomnia due to other mental disorder: Secondary | ICD-10-CM

## 2017-12-03 DIAGNOSIS — F4010 Social phobia, unspecified: Secondary | ICD-10-CM | POA: Insufficient documentation

## 2017-12-22 ENCOUNTER — Other Ambulatory Visit: Payer: Self-pay | Admitting: Physician Assistant

## 2017-12-22 DIAGNOSIS — F4010 Social phobia, unspecified: Secondary | ICD-10-CM

## 2017-12-22 DIAGNOSIS — F419 Anxiety disorder, unspecified: Secondary | ICD-10-CM

## 2017-12-22 DIAGNOSIS — F5105 Insomnia due to other mental disorder: Secondary | ICD-10-CM

## 2017-12-27 ENCOUNTER — Other Ambulatory Visit: Payer: Self-pay | Admitting: Physician Assistant

## 2017-12-27 DIAGNOSIS — F419 Anxiety disorder, unspecified: Secondary | ICD-10-CM

## 2017-12-27 DIAGNOSIS — F5105 Insomnia due to other mental disorder: Secondary | ICD-10-CM

## 2017-12-27 DIAGNOSIS — F4010 Social phobia, unspecified: Secondary | ICD-10-CM

## 2017-12-31 ENCOUNTER — Ambulatory Visit: Payer: Medicare PPO | Admitting: Physician Assistant

## 2018-01-01 ENCOUNTER — Encounter: Payer: Self-pay | Admitting: Physician Assistant

## 2018-01-01 ENCOUNTER — Ambulatory Visit: Payer: Medicare PPO | Admitting: Physician Assistant

## 2018-01-01 VITALS — BP 117/74 | HR 69 | Ht 69.02 in | Wt 227.0 lb

## 2018-01-01 DIAGNOSIS — F419 Anxiety disorder, unspecified: Secondary | ICD-10-CM | POA: Diagnosis not present

## 2018-01-01 DIAGNOSIS — Z79899 Other long term (current) drug therapy: Secondary | ICD-10-CM

## 2018-01-01 DIAGNOSIS — F4010 Social phobia, unspecified: Secondary | ICD-10-CM | POA: Diagnosis not present

## 2018-01-01 DIAGNOSIS — I10 Essential (primary) hypertension: Secondary | ICD-10-CM | POA: Diagnosis not present

## 2018-01-01 DIAGNOSIS — F5105 Insomnia due to other mental disorder: Secondary | ICD-10-CM

## 2018-01-01 DIAGNOSIS — M450 Ankylosing spondylitis of multiple sites in spine: Secondary | ICD-10-CM

## 2018-01-01 DIAGNOSIS — E6609 Other obesity due to excess calories: Secondary | ICD-10-CM | POA: Diagnosis not present

## 2018-01-01 DIAGNOSIS — M2550 Pain in unspecified joint: Secondary | ICD-10-CM | POA: Diagnosis not present

## 2018-01-01 MED ORDER — AMLODIPINE BESYLATE 5 MG PO TABS: 5.0000 mg | ORAL_TABLET | ORAL | 5 refills | Status: DC

## 2018-01-01 MED ORDER — HYDROXYZINE HCL 25 MG PO TABS: 25.0000 mg | ORAL_TABLET | Freq: Every evening | ORAL | 5 refills | Status: DC | PRN

## 2018-01-01 MED ORDER — MELOXICAM 15 MG PO TABS: 15.0000 mg | ORAL_TABLET | Freq: Every day | ORAL | 5 refills | Status: DC

## 2018-01-01 NOTE — Patient Instructions (Addendum)
For your blood pressure: - Goal <130/80 - continue your Metoprolol twice a day - start Amlodipine in the morning. Okay to take with Metoprolol - monitor and log blood pressures at home - check around the same time each day in a relaxed setting - Limit salt to <2000 mg/day - Follow DASH eating plan - limit alcohol to 2 standard drinks per day for men and 1 per day for women - avoid tobacco products - weight loss: 7% of current body weight - follow-up every 6 months for your blood pressure   For weight loss: - measure calories using MyFitnessPal app - 1700-1900 cals/day is a good range for you - aim for your macros to be evenly distributed (30-30-30ish of fat, carb, protein) - research medications in terms of cost and side effect profile. I think Belviq would be best out of all the options, but you tell me!   DASH Eating Plan DASH stands for "Dietary Approaches to Stop Hypertension." The DASH eating plan is a healthy eating plan that has been shown to reduce high blood pressure (hypertension). It may also reduce your risk for type 2 diabetes, heart disease, and stroke. The DASH eating plan may also help with weight loss. What are tips for following this plan? General guidelines  Avoid eating more than 2,300 mg (milligrams) of salt (sodium) a day. If you have hypertension, you may need to reduce your sodium intake to 1,500 mg a day.  Limit alcohol intake to no more than 1 drink a day for nonpregnant women and 2 drinks a day for men. One drink equals 12 oz of beer, 5 oz of wine, or 1 oz of hard liquor.  Work with your health care provider to maintain a healthy body weight or to lose weight. Ask what an ideal weight is for you.  Get at least 30 minutes of exercise that causes your heart to beat faster (aerobic exercise) most days of the week. Activities may include walking, swimming, or biking.  Work with your health care provider or diet and nutrition specialist (dietitian) to adjust  your eating plan to your individual calorie needs. Reading food labels  Check food labels for the amount of sodium per serving. Choose foods with less than 5 percent of the Daily Value of sodium. Generally, foods with less than 300 mg of sodium per serving fit into this eating plan.  To find whole grains, look for the word "whole" as the first word in the ingredient list. Shopping  Buy products labeled as "low-sodium" or "no salt added."  Buy fresh foods. Avoid canned foods and premade or frozen meals. Cooking  Avoid adding salt when cooking. Use salt-free seasonings or herbs instead of table salt or sea salt. Check with your health care provider or pharmacist before using salt substitutes.  Do not fry foods. Cook foods using healthy methods such as baking, boiling, grilling, and broiling instead.  Cook with heart-healthy oils, such as olive, canola, soybean, or sunflower oil. Meal planning   Eat a balanced diet that includes: ? 5 or more servings of fruits and vegetables each day. At each meal, try to fill half of your plate with fruits and vegetables. ? Up to 6-8 servings of whole grains each day. ? Less than 6 oz of lean meat, poultry, or fish each day. A 3-oz serving of meat is about the same size as a deck of cards. One egg equals 1 oz. ? 2 servings of low-fat dairy each day. ? A  serving of nuts, seeds, or beans 5 times each week. ? Heart-healthy fats. Healthy fats called Omega-3 fatty acids are found in foods such as flaxseeds and coldwater fish, like sardines, salmon, and mackerel.  Limit how much you eat of the following: ? Canned or prepackaged foods. ? Food that is high in trans fat, such as fried foods. ? Food that is high in saturated fat, such as fatty meat. ? Sweets, desserts, sugary drinks, and other foods with added sugar. ? Full-fat dairy products.  Do not salt foods before eating.  Try to eat at least 2 vegetarian meals each week.  Eat more home-cooked food  and less restaurant, buffet, and fast food.  When eating at a restaurant, ask that your food be prepared with less salt or no salt, if possible. What foods are recommended? The items listed may not be a complete list. Talk with your dietitian about what dietary choices are best for you. Grains Whole-grain or whole-wheat bread. Whole-grain or whole-wheat pasta. Brown rice. [place]. Bulgur. Whole-grain and low-sodium cereals. Pita bread. Low-fat, low-sodium crackers. Whole-wheat flour tortillas. Vegetables Fresh or frozen vegetables (raw, steamed, roasted, or grilled). Low-sodium or reduced-sodium tomato and vegetable juice. Low-sodium or reduced-sodium tomato sauce and tomato paste. Low-sodium or reduced-sodium canned vegetables. Fruits All fresh, dried, or frozen fruit. Canned fruit in natural juice (without added sugar). Meat and other protein foods Skinless chicken or [place]. Ground chicken or [place]. Pork with fat trimmed off. Fish and seafood. Egg whites. Dried beans, peas, or lentils. Unsalted nuts, nut butters, and seeds. Unsalted canned beans. Lean cuts of beef with fat trimmed off. Low-sodium, lean deli meat. Dairy Low-fat (1%) or fat-free (skim) milk. Fat-free, low-fat, or reduced-fat cheeses. Nonfat, low-sodium ricotta or cottage cheese. Low-fat or nonfat yogurt. Low-fat, low-sodium cheese. Fats and oils Soft margarine without trans fats. Vegetable oil. Low-fat, reduced-fat, or light mayonnaise and salad dressings (reduced-sodium). Canola, safflower, olive, soybean, and sunflower oils. Avocado. Seasoning and other foods Herbs. Spices. Seasoning mixes without salt. Unsalted popcorn and pretzels. Fat-free sweets. What foods are not recommended? The items listed may not be a complete list. Talk with your dietitian about what dietary choices are best for you. Grains Baked goods made with fat, such as croissants, muffins, or some breads. Dry pasta or rice meal  packs. Vegetables Creamed or fried vegetables. Vegetables in a cheese sauce. Regular canned vegetables (not low-sodium or reduced-sodium). Regular canned tomato sauce and paste (not low-sodium or reduced-sodium). Regular tomato and vegetable juice (not low-sodium or reduced-sodium). Angie Fava. Olives. Fruits Canned fruit in a light or heavy syrup. Fried fruit. Fruit in cream or butter sauce. Meat and other protein foods Fatty cuts of meat. Ribs. Fried meat. Berniece Salines. Sausage. Bologna and other processed lunch meats. Salami. Fatback. Hotdogs. Bratwurst. Salted nuts and seeds. Canned beans with added salt. Canned or smoked fish. Whole eggs or egg yolks. Chicken or [place] with skin. Dairy Whole or 2% milk, cream, and half-and-half. Whole or full-fat cream cheese. Whole-fat or sweetened yogurt. Full-fat cheese. Nondairy creamers. Whipped toppings. Processed cheese and cheese spreads. Fats and oils Butter. Stick margarine. Lard. Shortening. Ghee. Bacon fat. Tropical oils, such as coconut, palm kernel, or palm oil. Seasoning and other foods Salted popcorn and pretzels. Onion salt, garlic salt, seasoned salt, table salt, and sea salt. Worcestershire sauce. Tartar sauce. Barbecue sauce. Teriyaki sauce. Soy sauce, including reduced-sodium. Steak sauce. Canned and packaged gravies. Fish sauce. Oyster sauce. Cocktail sauce. Horseradish that you find on the shelf. Ketchup. Mustard.  Meat flavorings and tenderizers. Bouillon cubes. Hot sauce and Tabasco sauce. Premade or packaged marinades. Premade or packaged taco seasonings. Relishes. Regular salad dressings. Where to find more information:  National Heart, Lung, and [place]: https://wilson-eaton.com/  American Heart Association: www.heart.org Summary  The DASH eating plan is a healthy eating plan that has been shown to reduce high blood pressure (hypertension). It may also reduce your risk for type 2 diabetes, heart disease, and stroke.  With the DASH eating  plan, you should limit salt (sodium) intake to 2,300 mg a day. If you have hypertension, you may need to reduce your sodium intake to 1,500 mg a day.  When on the DASH eating plan, aim to eat more fresh fruits and vegetables, whole grains, lean proteins, low-fat dairy, and heart-healthy fats.  Work with your health care provider or diet and nutrition specialist (dietitian) to adjust your eating plan to your individual calorie needs. This information is not intended to replace advice given to you by your health care provider. Make sure you discuss any questions you have with your health care provider. Document Released: 07/17/2011 Document Revised: 07/21/2016 Document Reviewed: 07/21/2016 Elsevier Interactive Patient Education  [employer].

## 2018-01-01 NOTE — Progress Notes (Signed)
HPI:                                                                Vincent Black is a 47 y.o. male who presents to [place]: [facility] today for follow-up  HTN: taking low-dose Metoprolol bid (comorbid social anxiety). Checks BP's at home. BP range 130's-140's/80's-100. Denies vision change, headache, chest pain with exertion, orthopnea, lightheadedness, syncope and edema. Risk factors include: male sex, obesity  Anxiety: has used Xanax approximately 15 of the last 30 days. Feeling better. Hydroxyzine has helped with sleep, taking 25 mg as needed.  AS/Arthralgias: He also complains of worsening joint pain for the last 3 months, mainly in the elbows and hips. Currently on Imuran for ankylosing spondylitis. He has chronic back pain. He was improved with Humira, but unfortunately lost coverage. He is requesting a new referral for a rheumatologist.  Depression screen [facility] 2/9 01/01/2018 11/30/2017 11/21/2014 11/15/2014 11/15/2014  Decreased Interest 1 1 1 1  0  Down, Depressed, Hopeless 1 1 1 1 1   PHQ - 2 Score 2 2 2 2 1   Altered sleeping 1 2 1 1  -  Tired, decreased energy 1 1 1 1  -  Change in appetite 0 0 3 3 -  Feeling bad or failure about yourself  0 0 0 0 -  Trouble concentrating 0 0 2 2 -  Moving slowly or fidgety/restless 0 0 1 3 -  Suicidal thoughts 0 0 0 0 -  PHQ-9 Score 4 5 10 12  -  Difficult doing work/chores Somewhat difficult - Extremely dIfficult Extremely dIfficult -    GAD 7 : Generalized Anxiety Score 01/01/2018 11/30/2017  Nervous, Anxious, on Edge 2 3  Control/stop worrying 1 1  Worry too much - different things 1 1  Trouble relaxing 1 1  Restless 0 0  Easily annoyed or irritable 3 1  Afraid - awful might happen 1 0  Total GAD 7 Score 9 7  Anxiety Difficulty Somewhat difficult -      Past Medical History:  Diagnosis Date  . Acute renal failure ([place]) 12/13/2014  . Ankylosing spondylitis ([place]) 09/30/2014  . Anxiety   . C.  difficile colitis 09/30/2014  . Crohn disease ([place])   . Depression   . Hypertension   . Metabolic acidosis   . Perforated sigmoid colon ([place])   . Pericolonic abscess 10/27/2014  . Septic shock ([place])   . Small bowel fistula   . Toxic megacolon [facility])    Past Surgical History:  Procedure Laterality Date  . FLEXIBLE SIGMOIDOSCOPY N/A 09/29/2014   Procedure: FLEXIBLE SIGMOIDOSCOPY;  Surgeon: Beryle Beams, MD;  Location: [facility] ENDOSCOPY;  Service: Endoscopy;  Laterality: N/A;  . LAPAROTOMY N/A 12/04/2014   Procedure: EXPLORATORY LAPAROTOMY, Total Colectomy;  Surgeon: Rolm Bookbinder, MD;  Location: [place];  Service: General;  Laterality: N/A;   Social History   Tobacco Use  . Smoking status: Former Smoker    Packs/day: 0.50    Years: 10.00    Pack years: 5.00    Types: Cigarettes    Last attempt to quit: 07/07/2013    Years since quitting: 4.5  . Smokeless tobacco: Never Used  . Tobacco comment: 'quit smoking in ~ 2014"  Substance Use  Topics  . Alcohol use: Not Currently    Comment: 10/25/2014 "might have a drink a couple times/yr"   family history includes Hypertension in his father.    ROS: negative except as noted in the HPI  Medications: Current Outpatient Medications  Medication Sig Dispense Refill  . ALPRAZolam (XANAX) 0.25 MG tablet Take 1 tablet (0.25 mg total) by mouth every 8 (eight) hours as needed for anxiety. 30 tablet 3  . azaTHIOprine (IMURAN) 50 MG tablet Take 5 tablets by mouth daily.  5  . hydrOXYzine (ATARAX/VISTARIL) 25 MG tablet Take 1 tablet (25 mg total) by mouth at bedtime as needed for anxiety (insomnia). Due for follow up visit 30 tablet 5  . metoprolol tartrate (LOPRESSOR) 25 MG tablet Take 0.5 tablets (12.5 mg total) by mouth 2 (two) times daily. 60 tablet 0  . amLODipine (NORVASC) 5 MG tablet Take 1 tablet (5 mg total) by mouth every morning. 30 tablet 5  . meloxicam (MOBIC) 15 MG tablet Take 1 tablet (15 mg total) by mouth daily. 30 tablet 5   No  current facility-administered medications for this visit.    No Known Allergies     Objective:  BP 117/74   Pulse 69   Ht 5' 9.02" (1.753 m)   Wt 227 lb (103 kg)   SpO2 97%   BMI 33.51 kg/m  Gen:  alert, not ill-appearing, no distress, appropriate for age, obese male HEENT: head normocephalic without obvious abnormality, conjunctiva and cornea clear, trachea midline Pulm: Normal work of breathing, normal phonation, clear to auscultation bilaterally, no wheezes, rales or rhonchi CV: Normal rate, regular rhythm, s1 and s2 distinct, no murmurs, clicks or rubs  Neuro: alert and oriented x 3, no tremor MSK: extremities atraumatic, normal gait and station, no peripheral edema Skin: intact, no rashes on exposed skin, no jaundice, no cyanosis Psych: well-groomed, cooperative, good eye contact, depressed mood, affect mood-congruent, speech is articulate, and thought processes clear and goal-directed    No results found for this or any previous visit (from the past 72 hour(s)). No results found.    Assessment and Plan: 47 y.o. male with   Encounter for medication management  Social anxiety disorder - Plan: hydrOXYzine (ATARAX/VISTARIL) 25 MG tablet  Insomnia secondary to anxiety - Plan: hydrOXYzine (ATARAX/VISTARIL) 25 MG tablet  Polyarthralgia - Plan: Ambulatory referral to Rheumatology, meloxicam (MOBIC) 15 MG tablet  Ankylosing spondylitis of multiple sites in spine ([place]) - Plan: Ambulatory referral to Rheumatology, meloxicam (MOBIC) 15 MG tablet  Hypertension goal BP (blood pressure) < 130/80 - Plan: amLODipine (NORVASC) 5 MG tablet  Class 1 obesity due to excess calories with serious comorbidity in adult, unspecified BMI  HTN BP Readings from Last 3 Encounters:  01/01/18 117/74  11/30/17 (!) 133/92  12/20/14 (!) 140/94  - BP in range in office today. Home readings are out of range. Beta blocker is not first line, but he uses it for co-morbid SAD. Will add Amlodipine 5  mg for better BP control - counseled on therapeutic lifestyle changes  Anxiety disorder/ insomnia PHQ9=4, GAD7=9, moderate - cont Hydroxyzine 25 mg QHS prn and Metoprolol bid - continue to limit benzodiazepine to as needed for severe anxiety/panic attacks  AS/Arthralgia of multiple joints - starting Meloxicam 15 mg daily with food - cont Imuran - follow-up with Rheumatology for further management  Obesity, Class 1 Wt Readings from Last 3 Encounters:  01/01/18 227 lb (103 kg)  11/30/17 226 lb (102.5 kg)  12/12/14 170 lb 6.7  oz (77.3 kg)  - counseled on weight loss through decreased caloric intake and increased aerobic exercise - 1700-1900 moderate protein diet - consider referral to nutritionist due to Crohn's disease - provided with weight loss medications to research. I think Belviq would be a good option for him.   Patient education and anticipatory guidance given Patient agrees with treatment plan Follow-up in 4 weeks for HTN and weight management or sooner as needed if symptoms worsen or fail to improve  Darlyne Russian PA-C

## 2018-01-05 ENCOUNTER — Encounter: Payer: Self-pay | Admitting: Physician Assistant

## 2018-01-05 DIAGNOSIS — M2550 Pain in unspecified joint: Secondary | ICD-10-CM | POA: Insufficient documentation

## 2018-01-05 DIAGNOSIS — E6609 Other obesity due to excess calories: Secondary | ICD-10-CM | POA: Insufficient documentation

## 2018-01-29 ENCOUNTER — Ambulatory Visit: Payer: Medicare PPO | Admitting: Physician Assistant

## 2018-02-19 ENCOUNTER — Ambulatory Visit (INDEPENDENT_AMBULATORY_CARE_PROVIDER_SITE_OTHER): Payer: Medicare PPO | Admitting: Physician Assistant

## 2018-02-19 ENCOUNTER — Encounter: Payer: Self-pay | Admitting: Physician Assistant

## 2018-02-19 VITALS — BP 134/86 | HR 79

## 2018-02-19 DIAGNOSIS — H2000 Unspecified acute and subacute iridocyclitis: Secondary | ICD-10-CM | POA: Diagnosis not present

## 2018-02-19 MED ORDER — PREDNISOLONE ACETATE 1 % OP SUSP: 1.0000 [drp] | OPHTHALMIC | 0 refills | Status: DC

## 2018-02-19 MED ORDER — CYCLOPENTOLATE HCL 1 % OP SOLN: 1.0000 [drp] | Freq: Three times a day (TID) | OPHTHALMIC | 0 refills | Status: DC

## 2018-02-19 NOTE — Progress Notes (Signed)
HPI:                                                                Vincent Black is a 47 y.o. 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 He does not currently follow with Ophthalmology regularly. Reports 2 weeks ago he had vision change in his left eye where "everything looked milky." States his eye became red, tender, pruritic with photophobia. He began self-treating with pred forte eye drops twice a day for 2 weeks. The redness and itching improved, but his vision has not improved and he continues to endorse eye discomfort. Reports he has had significant vision loss in the past due to iritis and is concerned about this. Denies diplopia, floaters, fb sensation Does not wear contact lenses   Past Medical History:  Diagnosis Date  . Acute renal failure ([place]) 12/13/2014  . Ankylosing spondylitis ([place]) 09/30/2014  . Anxiety   . C. difficile colitis 09/30/2014  . Crohn disease ([place])   . Depression   . Hypertension   . Metabolic acidosis   . Perforated sigmoid colon ([place])   . Pericolonic abscess 10/27/2014  . Septic shock ([place])   . Small bowel fistula   . Toxic megacolon [facility])    Past Surgical History:  Procedure Laterality Date  . FLEXIBLE SIGMOIDOSCOPY N/A 09/29/2014   Procedure: FLEXIBLE SIGMOIDOSCOPY;  Surgeon: Beryle Beams, MD;  Location: [facility] ENDOSCOPY;  Service: Endoscopy;  Laterality: N/A;  . LAPAROTOMY N/A 12/04/2014   Procedure: EXPLORATORY LAPAROTOMY, Total Colectomy;  Surgeon: Rolm Bookbinder, MD;  Location: [place];  Service: General;  Laterality: N/A;   Social History   Tobacco Use  . Smoking status: Former Smoker    Packs/day: 0.50    Years: 10.00    Pack years: 5.00    Types: Cigarettes    Last attempt to quit: 07/07/2013    Years since quitting: 4.6  . Smokeless tobacco: Never Used   . Tobacco comment: 'quit smoking in ~ 2014"  Substance Use Topics  . Alcohol use: Not Currently    Comment: 10/25/2014 "might have a drink a couple times/yr"   family history includes Hypertension in his father.    ROS: negative except as noted in the HPI  Medications: Current Outpatient Medications  Medication Sig Dispense Refill  . ALPRAZolam (XANAX) 0.25 MG tablet Take 1 tablet (0.25 mg total) by mouth every 8 (eight) hours as needed for anxiety. 30 tablet 3  . amLODipine (NORVASC) 5 MG tablet Take 1 tablet (5 mg total) by mouth every morning. 30 tablet 5  . azaTHIOprine (IMURAN) 50 MG tablet Take 5 tablets by mouth daily.  5  . cyclopentolate (CYCLODRYL,CYCLOGYL) 1 % ophthalmic solution Place 1 drop into the left eye 3 (three) times daily. 1 mL 0  . hydrOXYzine (ATARAX/VISTARIL) 25 MG tablet Take 1 tablet (25 mg total) by mouth at bedtime as needed for anxiety (insomnia). Due for follow up visit 30 tablet 5  . meloxicam (MOBIC) 15 MG tablet Take 1 tablet (15 mg total) by mouth daily. 30 tablet 5  . metoprolol tartrate (  LOPRESSOR) 25 MG tablet Take 0.5 tablets (12.5 mg total) by mouth 2 (two) times daily. 60 tablet 0  . prednisoLONE acetate (PRED FORTE) 1 % ophthalmic suspension Place 1 drop into the left eye every 2 (two) hours while awake. 5 mL 0   No current facility-administered medications for this visit.    No Known Allergies     Objective:  There were no vitals taken for this visit. Gen:  alert, not ill-appearing, no distress, appropriate for age Physical Exam  Eyes: Pupils are equal, round, and reactive to light. Conjunctivae, EOM and lids are normal. Right eye exhibits no chemosis and no discharge. Left conjunctiva is not injected.  Visual acuity OD 20/20 OS 20/20 OU 20/20      No results found for this or any previous visit (from the past 72 hour(s)). No results found.    Assessment and Plan: 47 y.o. male with   Acute anterior uveitis of left eye -  Plan: prednisoLONE acetate (PRED FORTE) 1 % ophthalmic suspension, cyclopentolate (CYCLODRYL,CYCLOGYL) 1 % ophthalmic solution, Ambulatory referral to Ophthalmology - consulting with Deitra Mayo PA-C on the phone who recommended increasing Pred Forte to 1 drop 6 times daily and Cyclopentolate 1 drop three times per day. Counseled to wear UV eye protection. Close follow-up with Ophth on Monday   Patient education and anticipatory guidance given Patient agrees with treatment plan Follow-up as needed if symptoms worsen or fail to improve  Darlyne Russian PA-C

## 2018-02-19 NOTE — Patient Instructions (Addendum)
Prednisolone eye drop: 1 drop, 6 times per day (Shake the bottle well)  Cyclopentolate three times daily will help with irritation. This dilates the eye so make sure to wear sunglasses with UV protection. If you see any other doctor let them know you are taking this medication  Follow-up with [facility] at 8 am on Monday   Uveitis Uveitis is swelling and irritation (inflammation) in the eye. It often affects the middle part of the eye (uvea). This area contains many of the blood vessels that supply the rest of the eye. The uvea is made up of three structures:  The middle layer of the eyeball (choroid).  The colored part of the front of the eye (iris).  The connection between the iris and the choroid (ciliary body).  Uveitis can affect any part of the uvea as well as other important structures in the eye. There are many types of uveitis:  Iritis, or anterior uveitis, affects the iris. This is the most common type. It can start suddenly and can last for many weeks.  Intermediate uveitis affects the structures in the middle of the eye. This includes the fluid that fills the eye (vitreous). This type can last for years and can come and go.  Posterior uveitis affects the structures in the back of the eye. This includes the light-sensitive layer of cells that is needed for vision (retina). This is the least common type.  Panuveitis affects all layers of the eye.  Uveitis can affect one eye or both eyes. It can cause short-term or long-term symptoms. Symptoms may go away and come back. Over time, the condition can damage or destroy eye structures and can lead to vision loss. What are the causes? This condition may be caused by:  Infections that start in the eye or spread to the eye.  Inflammatory diseases that can affect the eye.  Autoimmune diseases. These are diseases in which the body's defense system (immune system) mistakenly attacks the body's own tissues.  Eye  injuries.  In some cases, the cause may not be known. What increases the risk? This condition is more likely to develop in people who are 46-46 years old. What are the signs or symptoms? Symptoms of this condition depend on the type of uveitis. Common symptoms include:  Eye redness.  Eye pain.  Blurred vision.  Decreased vision.  Floating dark spots in your vision (floaters).  Sensitivity to light.  A white spot in the lower part of the iris (hypopyon).  How is this diagnosed? This condition is usually diagnosed by an eye specialist (ophthalmologist). The ophthalmologist will do a complete eye exam. This exam may include:  A vision test using eye charts.  An exam that involves using a scope for viewing inside the eye (ophthalmoscope or slit lamp). Eye drops may be used to widen (dilate) your pupil to make it easier to see inside your eye.  A test to measure eye pressure.  You may have other medical tests to help determine the cause of your uveitis. How is this treated? Treatment for this condition may depend on the type of uveitis that you have. It should be started right away to help prevent vision loss. Treatment may include:  Medicine to block inflammation (steroids). You may get steroids: ? As eye drops. ? As an injection into your eye. ? By mouth.  Eye drops to dilate the pupil and reduce pressure inside the eye.  In some cases, medicines may be given through  an IV tube or through a device that is implanted inside the eye.  Follow these instructions at home:  Take medicines only as directed by your health care provider. Use eye drops exactly as directed.  Follow instructions from your health care provider about any restrictions on your activities. Ask what activities are safe for you.  Do not use any tobacco products, including cigarettes, chewing tobacco, or electronic cigarettes. If you need help quitting, ask your health care provider.  Keep all follow-up  visits as directed by your health care provider. This is important. Contact a health care provider if:  Your medicines are not working. Get help right away if:  You have redness in one eye or both eyes.  Your eyes are very sensitive to light.  You have pain or aching in either eye.  You have vision loss in either eye. This information is not intended to replace advice given to you by your health care provider. Make sure you discuss any questions you have with your health care provider. Document Released: 10/24/2008 Document Revised: 03/30/2016 Document Reviewed: 08/02/2014 Elsevier Interactive Patient Education  [employer].

## 2018-02-22 ENCOUNTER — Encounter: Payer: Self-pay | Admitting: Physician Assistant

## 2018-03-31 ENCOUNTER — Encounter: Payer: Self-pay | Admitting: Physician Assistant

## 2018-07-03 ENCOUNTER — Other Ambulatory Visit: Payer: Self-pay | Admitting: Physician Assistant

## 2018-07-03 DIAGNOSIS — I10 Essential (primary) hypertension: Secondary | ICD-10-CM

## 2018-09-23 ENCOUNTER — Ambulatory Visit (INDEPENDENT_AMBULATORY_CARE_PROVIDER_SITE_OTHER): Payer: Self-pay | Admitting: Physician Assistant

## 2018-09-23 VITALS — BP 125/82 | HR 66 | Wt 200.0 lb

## 2018-09-23 DIAGNOSIS — R000 Tachycardia, unspecified: Secondary | ICD-10-CM

## 2018-09-23 DIAGNOSIS — H2011 Chronic iridocyclitis, right eye: Secondary | ICD-10-CM

## 2018-09-23 DIAGNOSIS — F419 Anxiety disorder, unspecified: Secondary | ICD-10-CM

## 2018-09-23 DIAGNOSIS — Z13220 Encounter for screening for lipoid disorders: Secondary | ICD-10-CM

## 2018-09-23 DIAGNOSIS — Z23 Encounter for immunization: Secondary | ICD-10-CM

## 2018-09-23 DIAGNOSIS — I10 Essential (primary) hypertension: Secondary | ICD-10-CM

## 2018-09-23 DIAGNOSIS — F5105 Insomnia due to other mental disorder: Secondary | ICD-10-CM

## 2018-09-23 DIAGNOSIS — F4010 Social phobia, unspecified: Secondary | ICD-10-CM

## 2018-09-23 DIAGNOSIS — M450 Ankylosing spondylitis of multiple sites in spine: Secondary | ICD-10-CM

## 2018-09-23 MED ORDER — METOPROLOL TARTRATE 25 MG PO TABS: 12.5000 mg | ORAL_TABLET | Freq: Two times a day (BID) | ORAL | 1 refills | Status: DC

## 2018-09-23 MED ORDER — HYDROXYZINE HCL 25 MG PO TABS: 25.0000 mg | ORAL_TABLET | Freq: Every evening | ORAL | 3 refills | Status: AC | PRN

## 2018-09-23 MED ORDER — ALPRAZOLAM 0.25 MG PO TABS: 0.2500 mg | ORAL_TABLET | Freq: Three times a day (TID) | ORAL | 3 refills | Status: AC | PRN

## 2018-09-23 MED ORDER — DIFLUPREDNATE 0.05 % OP EMUL: OPHTHALMIC | 0 refills | Status: AC

## 2018-09-23 NOTE — Patient Instructions (Addendum)
[facility] Phone: [phone]   [facility] [address], [place], [place] [address] [phone]    Uveitis Uveitis is inflammation of the eye. It often affects the middle part of the eye (uvea). This area contains many of the blood vessels that supply the rest of the eye. The uvea is made up of three structures:  The middle layer of the eyeball (choroid).  The colored part of the front of the eye (iris).  The connection between the iris and the choroid (ciliary body). Uveitis can affect any part of the uvea as well as other important structures in the eye. There are many types of uveitis:  Iritis, or anterior uveitis, affects the iris. This is the most common type. It can start suddenly and can last for many weeks.  Intermediate uveitis affects the structures in the middle of the eye. This includes the fluid that fills the eye (vitreous). This type can last for years and can come and go.  Posterior uveitis affects the structures in the back of the eye. This includes the light-sensitive layer of cells that is needed for vision (retina). This is the least common type.  Panuveitis affects all layers of the eye. Uveitis can affect one eye or both eyes. It can cause short-term or long-term symptoms. Symptoms may go away and come back. Over time, the condition can damage or destroy eye structures and may lead to vision loss. What are the causes? This condition may be caused by:  Autoimmune diseases. These are diseases in which the body's defense system (immune system) mistakenly attacks the body's own tissues.  Infections that start in the eye or spread to the eye.  Inflammatory diseases that can affect the eye.  Eye injuries. In some cases, the cause may not be known. What increases the risk? The following factors may make you more likely to develop this condition:  Being 4-14 years old.  Using tobacco products,  such as cigarettes.  Taking certain medicines.  Having certain inherited genes. What are the signs or symptoms? Symptoms of this condition depend on the type of uveitis. Common symptoms include:  Eye redness.  Eye pain.  Blurred vision.  Decreased vision.  Having a blind spot in your vision.  Floating dark spots in your vision (floaters).  Seeing flashing lights (photopsia).  Sensitivity to light (photophobia).  A white section on the lower part of the iris (hypopyon). How is this diagnosed? This condition may be diagnosed based on:  An eye exam.  A vision test using eye charts.  An exam in which a scope is used to view inside the eye (ophthalmoscope or slit lamp). Eye drops may be used to widen (dilate) your pupil to make it easier to see inside your eye.  A test to measure eye pressure. You may have other medical tests to help determine the cause of your uveitis. How is this treated? Treatment for this condition may depend on the type of uveitis that you have. It should be started right away to help prevent vision loss. Treatment may include:  Medicines to treat inflammation (steroids). You may get steroids: ? As eye drops. ? As an injection into your eye. ? By mouth.  Eye drops to dilate the pupil and reduce pressure inside the eye.  In some cases, medicines may be given through an IV or through a device that is implanted inside the eye.  Other medicines may be needed depending on  the cause of your uveitis.  In rare cases, surgery may be needed (vitrectomy). Follow these instructions at home:   Take over-the-counter and prescription medicines only as told by your health care provider.  Follow instructions for safely putting eye drops in your eyes.  Drink enough fluid to keep your urine pale yellow.  Follow instructions from your health care provider about any restrictions on your activities. Ask what activities are safe for you.  Do not use any  products that contain nicotine or tobacco, such as cigarettes and e-cigarettes. If you need help quitting, ask your health care provider.  Keep all follow-up visits as told by your health care provider. This is important. Contact a health care provider if:  Your symptoms do not improve. Get help right away if:  You have vision loss in either eye.  You have more redness in one eye or both eyes.  Your eyes are very sensitive to light.  You have pain or aching in either eye. Summary  Uveitis is inflammation of the eye. It often affects the middle part of the eye (uvea). This area contains many of the blood vessels that supply the rest of the eye.  Uveitis can affect one eye or both eyes. It can cause short-term or long-term symptoms. Symptoms may go away and come back.  Treatment for this condition may depend on the type of uveitis that you have. It should be started right away to help prevent vision loss.  Over time, the condition can damage or destroy eye structures and may lead to vision loss. This information is not intended to replace advice given to you by your health care provider. Make sure you discuss any questions you have with your health care provider. Document Released: 10/24/2008 Document Revised: 08/18/2017 Document Reviewed: 08/18/2017 Elsevier Interactive Patient Education  2019 [employer].

## 2018-09-23 NOTE — Progress Notes (Signed)
HPI:                                                                Vincent Black is a 48 y.o. male who presents to [facility] today for medication mgmt  AS: severe, multilevel ankylosing spondylitis. Currently taking Tramadol 50 mg QHS prn for pain. Would like me to refill this. He is requesting a new referral to a Rheumatologist. Currently followed by [facility] (Dr. Amil Amen), but is not satisfied with his care and would like to establish elsewhere. He was previously taking Humira, but has been off of this for almost a year due to  Chronic Uveitis: reports flare-up in his right eye for the last 10-14 days. Endorses redness, pain and blurred vision. He has not seen Ophth since July. At that time he was having refractory symptoms despite Pred Forte and prescribed Difluprednate.  HTN/tachycardia: self-discontinued his Amlodipine; reports he cut out salt and sugar and has lost 40 pounds. Currently taking low-dose Lopressor 12.5 mg twice daily for sinus tachycardia. Denies vision change, headache, chest pain with exertion, orthopnea, lightheadedness, syncope and edema. Risk factors include: male sex  Depression/Anxiety: requesting refills of Xanax and Hydroxyzine. Uses these as needed for panic attacks and insomnia. Recently having increased sleep difficulty related to pain. Denies symptoms of mania/hypomania. Denies suicidal thinking. Denies auditory/visual hallucinations.   Depression screen [facility] 2/9 09/23/2018 01/01/2018 11/30/2017 11/21/2014 11/15/2014  Decreased Interest 1 1 1 1 1   Down, Depressed, Hopeless 1 1 1 1 1   PHQ - 2 Score 2 2 2 2 2   Altered sleeping 3 1 2 1 1   Tired, decreased energy 1 1 1 1 1   Change in appetite 0 0 0 3 3  Feeling bad or failure about yourself  0 0 0 0 0  Trouble concentrating 1 0 0 2 2  Moving slowly or fidgety/restless 0 0 0 1 3  Suicidal thoughts 0 0 0 0 0  PHQ-9 Score 7 4 5 10 12   Difficult doing  work/chores - Somewhat difficult - Extremely dIfficult Extremely dIfficult    GAD 7 : Generalized Anxiety Score 09/23/2018 01/01/2018 11/30/2017  Nervous, Anxious, on Edge 3 2 3   Control/stop worrying 1 1 1   Worry too much - different things 1 1 1   Trouble relaxing 1 1 1   Restless 0 0 0  Easily annoyed or irritable 1 3 1   Afraid - awful might happen 0 1 0  Total GAD 7 Score 7 9 7   Anxiety Difficulty - Somewhat difficult -      Past Medical History:  Diagnosis Date  . Acute renal failure ([place]) 12/13/2014  . Ankylosing spondylitis ([place]) 09/30/2014  . Anxiety   . C. difficile colitis 09/30/2014  . Crohn disease ([place])   . Depression   . Hypertension   . Metabolic acidosis   . Perforated sigmoid colon ([place])   . Pericolonic abscess 10/27/2014  . Septic shock ([place])   . Small bowel fistula   . Toxic megacolon [facility])    Past Surgical History:  Procedure Laterality Date  . FLEXIBLE SIGMOIDOSCOPY N/A 09/29/2014   Procedure: FLEXIBLE SIGMOIDOSCOPY;  Surgeon: Beryle Beams, MD;  Location: [facility];  Service: Endoscopy;  Laterality: N/A;  .  LAPAROTOMY N/A 12/04/2014   Procedure: EXPLORATORY LAPAROTOMY, Total Colectomy;  Surgeon: Rolm Bookbinder, MD;  Location: [facility];  Service: General;  Laterality: N/A;   Social History   Tobacco Use  . Smoking status: Former Smoker    Packs/day: 0.50    Years: 10.00    Pack years: 5.00    Types: Cigarettes    Last attempt to quit: 07/07/2013    Years since quitting: 5.2  . Smokeless tobacco: Never Used  . Tobacco comment: 'quit smoking in ~ 2014"  Substance Use Topics  . Alcohol use: Not Currently    Comment: 10/25/2014 "might have a drink a couple times/yr"   family history includes Hypertension in his father.    ROS: negative except as noted in the HPI  Medications: Current Outpatient Medications  Medication Sig Dispense Refill  . ALPRAZolam (XANAX) 0.25 MG tablet Take 1 tablet (0.25 mg total) by mouth every 8 (eight) hours as needed  for anxiety. 20 tablet 3  . azaTHIOprine (IMURAN) 50 MG tablet Take 1.5 tablets by mouth daily.  5  . Difluprednate 0.05 % EMUL Use 6x/day as directed 5 mL 0  . hydrOXYzine (ATARAX/VISTARIL) 25 MG tablet Take 1 tablet (25 mg total) by mouth at bedtime as needed for anxiety (insomnia). 60 tablet 3  . metoprolol tartrate (LOPRESSOR) 25 MG tablet Take 0.5 tablets (12.5 mg total) by mouth 2 (two) times daily. 90 tablet 1  . traMADol (ULTRAM) 50 MG tablet Take 50 mg by mouth every 6 (six) hours as needed.     No current facility-administered medications for this visit.    No Known Allergies     Objective:  BP 125/82   Pulse 66   Wt 200 lb (90.7 kg)   BMI 29.52 kg/m  Gen:  alert, not ill-appearing, no distress, appropriate for age 52: head normocephalic without obvious abnormality, conjunctiva and cornea clear, trachea midline Pulm: Normal work of breathing, normal phonation  Neuro: alert and oriented x 3, no tremor MSK: extremities atraumatic, normal gait and station Skin: intact, no rashes on exposed skin, no jaundice, no cyanosis Psych: well-groomed, cooperative, good eye contact, depressed mood, affect full range, speech is articulate, thought processes clear and goal-directed, normal judgment, good insight    No results found for this or any previous visit (from the past 72 hour(s)). No results found.    Assessment and Plan: 48 y.o. male with   .Diagnoses and all orders for this visit:  Chronic anterior uveitis of right eye -     Difluprednate 0.05 % EMUL; Use 6x/day as directed -     Ambulatory referral to Rheumatology  Ankylosing spondylitis of multiple sites in spine [facility]) -     Ambulatory referral to Rheumatology  Social anxiety disorder -     ALPRAZolam (XANAX) 0.25 MG tablet; Take 1 tablet (0.25 mg total) by mouth every 8 (eight) hours as needed for anxiety. -     hydrOXYzine (ATARAX/VISTARIL) 25 MG tablet; Take 1 tablet (25 mg total) by mouth at bedtime as  needed for anxiety (insomnia).  Insomnia secondary to anxiety -     hydrOXYzine (ATARAX/VISTARIL) 25 MG tablet; Take 1 tablet (25 mg total) by mouth at bedtime as needed for anxiety (insomnia).  Sinus tachycardia -     metoprolol tartrate (LOPRESSOR) 25 MG tablet; Take 0.5 tablets (12.5 mg total) by mouth 2 (two) times daily.  Need for 23-polyvalent pneumococcal polysaccharide vaccine -     Pneumococcal polysaccharide vaccine 23-valent greater than  or equal to 2yo subcutaneous/IM  Hypertension goal BP (blood pressure) < 130/80 -     COMPLETE METABOLIC PANEL WITH GFR -     Lipid Panel w/reflex Direct LDL  Screening for lipid disorders -     Lipid Panel w/reflex Direct LDL   HTN Diet controlled. He has lost a significant amount of weight. Applauded for efforts. Self-discontinued Amlodipine. Will continue to monitor BP CMP and lipids pending  Sinus tachycardia Well controlled, cont Lopressor  Chronic uveitis, Left-sided flare Refilled Difluprednate He will need close f/u with Ophth. Provided with contact info for [facility] where he is an established patient and instructed to schedule appt  AS Referral placed to Rheumatology Pneumovax given in office today in advance of starting biologic therapy Tramadol refilled. Patient counseled not to combine this medication  Anxiety and depression PHQ9=7, no acute safety issues GAD7=7 Counseled on judicious use of Alprazolam and to avoid taking in combination with other sedatives including Tramadol. Counseled on risk of respiratory depression with opioid and benzodiazepine   Patient education and anticipatory guidance given Patient agrees with treatment plan Follow-up in 6 months or sooner as needed if symptoms worsen or fail to improve  Darlyne Russian PA-C

## 2018-09-24 ENCOUNTER — Encounter: Payer: Self-pay | Admitting: Physician Assistant

## 2018-09-29 ENCOUNTER — Other Ambulatory Visit: Payer: Self-pay | Admitting: Physician Assistant

## 2018-09-29 DIAGNOSIS — H2000 Unspecified acute and subacute iridocyclitis: Secondary | ICD-10-CM

## 2018-12-02 ENCOUNTER — Other Ambulatory Visit: Payer: Self-pay | Admitting: Physician Assistant

## 2018-12-02 LAB — COMPREHENSIVE METABOLIC PANEL
ALT: 22 (ref 3–30)
AST: 23
BUN: 12
Creatine, Serum: 1.4 — ABNORMAL HIGH
EGFR (African American): 60 — ABNORMAL LOW
Glucose: 91
Potassium: 4.8
Sodium: 143
Total Bilirubin: 0.5 (ref 0.1–1.4)

## 2018-12-02 LAB — CBC
HCT: 46.5
HEMOGLOBIN: 15.9
MCV: 89 (ref 76–111)
Platelet: 404
RBC: 5.24 — AB (ref 3.87–5.11)
WBC: 8.6

## 2018-12-16 ENCOUNTER — Encounter: Payer: Self-pay | Admitting: Physician Assistant

## 2019-03-24 ENCOUNTER — Ambulatory Visit: Payer: Self-pay | Admitting: Physician Assistant

## 2019-04-08 ENCOUNTER — Other Ambulatory Visit: Payer: Self-pay | Admitting: Physician Assistant

## 2019-04-08 ENCOUNTER — Ambulatory Visit: Payer: Medicare Other | Admitting: Physician Assistant

## 2019-04-08 DIAGNOSIS — R000 Tachycardia, unspecified: Secondary | ICD-10-CM
